# Patient Record
Sex: Female | Born: 1950
Health system: Southern US, Community
[De-identification: ages and names within clinical notes are randomized; demographics above are authoritative.]

## PROBLEM LIST (undated history)

## (undated) DIAGNOSIS — R51 Headache: Secondary | ICD-10-CM

## (undated) DIAGNOSIS — D510 Vitamin B12 deficiency anemia due to intrinsic factor deficiency: Secondary | ICD-10-CM

## (undated) DIAGNOSIS — G5600 Carpal tunnel syndrome, unspecified upper limb: Secondary | ICD-10-CM

## (undated) DIAGNOSIS — F411 Generalized anxiety disorder: Secondary | ICD-10-CM

## (undated) DIAGNOSIS — I10 Essential (primary) hypertension: Secondary | ICD-10-CM

## (undated) DIAGNOSIS — K9041 Non-celiac gluten sensitivity: Secondary | ICD-10-CM

## (undated) DIAGNOSIS — D802 Selective deficiency of immunoglobulin A [IgA]: Secondary | ICD-10-CM

## (undated) DIAGNOSIS — K589 Irritable bowel syndrome without diarrhea: Secondary | ICD-10-CM

## (undated) DIAGNOSIS — M899 Disorder of bone, unspecified: Secondary | ICD-10-CM

## (undated) DIAGNOSIS — M949 Disorder of cartilage, unspecified: Secondary | ICD-10-CM

## (undated) DIAGNOSIS — K828 Other specified diseases of gallbladder: Secondary | ICD-10-CM

## (undated) DIAGNOSIS — J309 Allergic rhinitis, unspecified: Secondary | ICD-10-CM

## (undated) DIAGNOSIS — E785 Hyperlipidemia, unspecified: Secondary | ICD-10-CM

## (undated) HISTORY — DX: Non-celiac gluten sensitivity: K90.41

## (undated) HISTORY — PX: TONSILLECTOMY: SHX5217

## (undated) HISTORY — DX: Generalized anxiety disorder: F41.1

## (undated) HISTORY — DX: Headache: R51

## (undated) HISTORY — DX: Other specified diseases of gallbladder: K82.8

## (undated) HISTORY — PX: CARPAL TUNNEL RELEASE: SHX101

## (undated) HISTORY — PX: APPENDECTOMY: SHX54

## (undated) HISTORY — DX: Disorder of bone, unspecified: M89.9

## (undated) HISTORY — DX: Selective deficiency of immunoglobulin a (iga): D80.2

## (undated) HISTORY — PX: ROTATOR CUFF REPAIR: SHX139

## (undated) HISTORY — DX: Carpal tunnel syndrome, unspecified upper limb: G56.00

## (undated) HISTORY — DX: Allergic rhinitis, unspecified: J30.9

## (undated) HISTORY — PX: UPPER GASTROINTESTINAL ENDOSCOPY: SHX188

## (undated) HISTORY — DX: Irritable bowel syndrome, unspecified: K58.9

## (undated) HISTORY — DX: Hyperlipidemia, unspecified: E78.5

## (undated) HISTORY — PX: DILATION AND CURETTAGE OF UTERUS: SHX78

## (undated) HISTORY — DX: Vitamin B12 deficiency anemia due to intrinsic factor deficiency: D51.0

## (undated) HISTORY — PX: COLONOSCOPY: SHX174

## (undated) HISTORY — DX: Disorder of bone, unspecified: M94.9

## (undated) HISTORY — DX: Essential (primary) hypertension: I10

---

## 1999-08-29 ENCOUNTER — Other Ambulatory Visit: Admission: RE | Admit: 1999-08-29 | Discharge: 1999-08-29 | Payer: Self-pay | Admitting: Family Medicine

## 2000-12-17 ENCOUNTER — Other Ambulatory Visit: Admission: RE | Admit: 2000-12-17 | Discharge: 2000-12-17 | Payer: Self-pay | Admitting: Family Medicine

## 2001-07-01 ENCOUNTER — Other Ambulatory Visit: Admission: RE | Admit: 2001-07-01 | Discharge: 2001-07-01 | Payer: Self-pay | Admitting: *Deleted

## 2002-03-06 ENCOUNTER — Other Ambulatory Visit: Admission: RE | Admit: 2002-03-06 | Discharge: 2002-03-06 | Payer: Self-pay | Admitting: *Deleted

## 2003-02-23 ENCOUNTER — Encounter: Admission: RE | Admit: 2003-02-23 | Discharge: 2003-02-23 | Payer: Self-pay | Admitting: *Deleted

## 2003-02-23 ENCOUNTER — Encounter: Payer: Self-pay | Admitting: *Deleted

## 2003-03-08 ENCOUNTER — Other Ambulatory Visit: Admission: RE | Admit: 2003-03-08 | Discharge: 2003-03-08 | Payer: Self-pay | Admitting: *Deleted

## 2003-06-04 ENCOUNTER — Encounter: Admission: RE | Admit: 2003-06-04 | Discharge: 2003-06-04 | Payer: Self-pay | Admitting: Internal Medicine

## 2003-06-04 ENCOUNTER — Encounter: Payer: Self-pay | Admitting: Internal Medicine

## 2003-09-08 HISTORY — PX: CHOLECYSTECTOMY: SHX55

## 2004-03-11 ENCOUNTER — Other Ambulatory Visit: Admission: RE | Admit: 2004-03-11 | Discharge: 2004-03-11 | Payer: Self-pay | Admitting: *Deleted

## 2004-05-04 ENCOUNTER — Observation Stay (HOSPITAL_COMMUNITY): Admission: EM | Admit: 2004-05-04 | Discharge: 2004-05-05 | Payer: Self-pay | Admitting: Emergency Medicine

## 2005-02-06 ENCOUNTER — Encounter: Payer: Self-pay | Admitting: Internal Medicine

## 2005-03-19 ENCOUNTER — Other Ambulatory Visit: Admission: RE | Admit: 2005-03-19 | Discharge: 2005-03-19 | Payer: Self-pay | Admitting: *Deleted

## 2005-03-27 ENCOUNTER — Encounter: Admission: RE | Admit: 2005-03-27 | Discharge: 2005-03-27 | Payer: Self-pay | Admitting: *Deleted

## 2005-04-09 ENCOUNTER — Encounter: Admission: RE | Admit: 2005-04-09 | Discharge: 2005-04-09 | Payer: Self-pay | Admitting: *Deleted

## 2005-04-14 ENCOUNTER — Encounter: Admission: RE | Admit: 2005-04-14 | Discharge: 2005-04-14 | Payer: Self-pay | Admitting: Gastroenterology

## 2005-04-29 ENCOUNTER — Encounter: Admission: RE | Admit: 2005-04-29 | Discharge: 2005-04-29 | Payer: Self-pay | Admitting: Gastroenterology

## 2005-07-15 ENCOUNTER — Encounter: Admission: RE | Admit: 2005-07-15 | Discharge: 2005-07-15 | Payer: Self-pay | Admitting: Internal Medicine

## 2005-07-24 ENCOUNTER — Ambulatory Visit (HOSPITAL_COMMUNITY): Admission: RE | Admit: 2005-07-24 | Discharge: 2005-07-24 | Payer: Self-pay | Admitting: General Surgery

## 2005-07-24 ENCOUNTER — Encounter (INDEPENDENT_AMBULATORY_CARE_PROVIDER_SITE_OTHER): Payer: Self-pay | Admitting: *Deleted

## 2005-08-19 ENCOUNTER — Ambulatory Visit: Payer: Self-pay | Admitting: Gastroenterology

## 2005-10-16 ENCOUNTER — Ambulatory Visit: Payer: Self-pay | Admitting: Gastroenterology

## 2005-12-02 ENCOUNTER — Ambulatory Visit: Payer: Self-pay | Admitting: Gastroenterology

## 2005-12-21 ENCOUNTER — Ambulatory Visit: Payer: Self-pay | Admitting: Gastroenterology

## 2006-01-29 ENCOUNTER — Encounter: Admission: RE | Admit: 2006-01-29 | Discharge: 2006-02-25 | Payer: Self-pay | Admitting: Internal Medicine

## 2006-03-03 ENCOUNTER — Ambulatory Visit: Payer: Self-pay | Admitting: Gastroenterology

## 2006-03-09 ENCOUNTER — Ambulatory Visit (HOSPITAL_COMMUNITY): Admission: RE | Admit: 2006-03-09 | Discharge: 2006-03-09 | Payer: Self-pay | Admitting: Gastroenterology

## 2006-03-11 ENCOUNTER — Ambulatory Visit (HOSPITAL_COMMUNITY): Admission: RE | Admit: 2006-03-11 | Discharge: 2006-03-11 | Payer: Self-pay | Admitting: Gastroenterology

## 2006-04-01 ENCOUNTER — Encounter (INDEPENDENT_AMBULATORY_CARE_PROVIDER_SITE_OTHER): Payer: Self-pay | Admitting: *Deleted

## 2006-04-01 ENCOUNTER — Other Ambulatory Visit: Admission: RE | Admit: 2006-04-01 | Discharge: 2006-04-01 | Payer: Self-pay | Admitting: *Deleted

## 2006-04-01 ENCOUNTER — Ambulatory Visit: Payer: Self-pay | Admitting: Gastroenterology

## 2006-04-19 ENCOUNTER — Ambulatory Visit: Payer: Self-pay | Admitting: Gastroenterology

## 2007-02-04 ENCOUNTER — Ambulatory Visit: Payer: Self-pay | Admitting: Gastroenterology

## 2007-02-04 LAB — CONVERTED CEMR LAB
Basophils Absolute: 0.1 10*3/uL (ref 0.0–0.1)
Basophils Relative: 1.3 % — ABNORMAL HIGH (ref 0.0–1.0)
Eosinophils Absolute: 0 10*3/uL (ref 0.0–0.6)
Eosinophils Relative: 0.5 % (ref 0.0–5.0)
Ferritin: 29.6 ng/mL (ref 10.0–291.0)
Folate: 6.7 ng/mL
HCT: 39.2 % (ref 36.0–46.0)
Hemoglobin: 13.7 g/dL (ref 12.0–15.0)
Hgb A1c MFr Bld: 5.5 % (ref 4.6–6.0)
Iron: 132 ug/dL (ref 42–145)
Lymphocytes Relative: 19.9 % (ref 12.0–46.0)
MCHC: 35 g/dL (ref 30.0–36.0)
MCV: 90.8 fL (ref 78.0–100.0)
Monocytes Absolute: 0.4 10*3/uL (ref 0.2–0.7)
Monocytes Relative: 7.3 % (ref 3.0–11.0)
Neutro Abs: 3.9 10*3/uL (ref 1.4–7.7)
Neutrophils Relative %: 71 % (ref 43.0–77.0)
Platelets: 219 10*3/uL (ref 150–400)
RBC: 4.32 M/uL (ref 3.87–5.11)
RDW: 12.2 % (ref 11.5–14.6)
Saturation Ratios: 27.9 % (ref 20.0–50.0)
Transferrin: 338.2 mg/dL (ref >=212.0)
Vitamin B-12: 197 pg/mL — ABNORMAL LOW (ref 211–911)
WBC: 5.5 10*3/uL (ref 4.5–10.5)

## 2007-02-18 ENCOUNTER — Encounter: Admission: RE | Admit: 2007-02-18 | Discharge: 2007-02-18 | Payer: Self-pay | Admitting: *Deleted

## 2007-03-10 ENCOUNTER — Ambulatory Visit: Payer: Self-pay | Admitting: Internal Medicine

## 2007-03-10 LAB — CONVERTED CEMR LAB
IgA: 54 mg/dL — ABNORMAL LOW (ref 68–378)
Tissue Transglutaminase Ab, IgA: 0 units (ref <7)

## 2007-03-24 ENCOUNTER — Ambulatory Visit: Payer: Self-pay | Admitting: Internal Medicine

## 2007-04-14 LAB — CONVERTED CEMR LAB
Albumin: 5.3 g/dL
Alkaline Phosphatase: 60 units/L
Calcium: 9.9 mg/dL
Creatinine, Ser: 0.76 mg/dL
Glucose, Bld: 87 mg/dL
Sodium: 130 meq/L
Total Protein: 6.9 g/dL

## 2007-04-28 ENCOUNTER — Other Ambulatory Visit: Admission: RE | Admit: 2007-04-28 | Discharge: 2007-04-28 | Payer: Self-pay | Admitting: *Deleted

## 2007-05-23 ENCOUNTER — Ambulatory Visit: Payer: Self-pay | Admitting: Internal Medicine

## 2007-09-16 LAB — CONVERTED CEMR LAB
BUN: 10 mg/dL
Calcium: 9.7 mg/dL
Creatinine, Ser: 0.88 mg/dL
HDL: 92 mg/dL
Sodium: 133 meq/L
Triglyceride fasting, serum: 38 mg/dL

## 2007-10-14 ENCOUNTER — Ambulatory Visit: Payer: Self-pay | Admitting: Internal Medicine

## 2007-10-14 DIAGNOSIS — N390 Urinary tract infection, site not specified: Secondary | ICD-10-CM | POA: Insufficient documentation

## 2007-10-14 DIAGNOSIS — K589 Irritable bowel syndrome without diarrhea: Secondary | ICD-10-CM | POA: Insufficient documentation

## 2007-10-14 DIAGNOSIS — K828 Other specified diseases of gallbladder: Secondary | ICD-10-CM | POA: Insufficient documentation

## 2007-10-14 DIAGNOSIS — D802 Selective deficiency of immunoglobulin A [IgA]: Secondary | ICD-10-CM | POA: Insufficient documentation

## 2007-10-14 DIAGNOSIS — D510 Vitamin B12 deficiency anemia due to intrinsic factor deficiency: Secondary | ICD-10-CM | POA: Insufficient documentation

## 2007-10-14 LAB — CONVERTED CEMR LAB: Vitamin B-12: 278 pg/mL (ref 211–911)

## 2007-11-04 ENCOUNTER — Ambulatory Visit: Payer: Self-pay | Admitting: Internal Medicine

## 2008-04-07 LAB — CONVERTED CEMR LAB: Pap Smear: NORMAL

## 2008-04-20 ENCOUNTER — Encounter: Admission: RE | Admit: 2008-04-20 | Discharge: 2008-04-20 | Payer: Self-pay | Admitting: Internal Medicine

## 2008-06-22 ENCOUNTER — Ambulatory Visit: Payer: Self-pay | Admitting: Internal Medicine

## 2008-06-22 DIAGNOSIS — M810 Age-related osteoporosis without current pathological fracture: Secondary | ICD-10-CM | POA: Insufficient documentation

## 2008-06-25 LAB — CONVERTED CEMR LAB
Ferritin: 21.2 ng/mL (ref 10.0–291.0)
Vitamin B-12: 1375 pg/mL — ABNORMAL HIGH (ref 211–911)

## 2008-06-27 LAB — CONVERTED CEMR LAB: Vit D, 1,25-Dihydroxy: 29 — ABNORMAL LOW (ref 30–89)

## 2008-06-29 ENCOUNTER — Ambulatory Visit: Payer: Self-pay | Admitting: Internal Medicine

## 2008-07-04 LAB — CONVERTED CEMR LAB
IgA: 58 mg/dL — ABNORMAL LOW (ref 68–378)
IgG (Immunoglobin G), Serum: 495 mg/dL — ABNORMAL LOW (ref 694–1618)
IgM, Serum: 55 mg/dL — ABNORMAL LOW (ref 60–263)

## 2008-07-06 ENCOUNTER — Telehealth (INDEPENDENT_AMBULATORY_CARE_PROVIDER_SITE_OTHER): Payer: Self-pay

## 2008-07-11 ENCOUNTER — Telehealth: Payer: Self-pay | Admitting: Internal Medicine

## 2008-07-27 ENCOUNTER — Encounter: Payer: Self-pay | Admitting: Internal Medicine

## 2009-03-29 ENCOUNTER — Encounter: Payer: Self-pay | Admitting: Internal Medicine

## 2009-03-29 LAB — CONVERTED CEMR LAB
ALT: 42 units/L
AST: 29 units/L
Albumin: 4.5 g/dL
Alkaline Phosphatase: 56 units/L
BUN: 7 mg/dL
CO2: 21 meq/L
Calcium: 9.6 mg/dL
Chloride: 102 meq/L
Cholesterol: 168 mg/dL
Creatinine, Ser: 0.8 mg/dL
Glucose, Bld: 96 mg/dL
HDL: 76 mg/dL
LDL (calc): 2.2 mg/dL
LDL Cholesterol: 82 mg/dL
Potassium: 4.2 meq/L
Sodium: 135 meq/L
Total Bilirubin: 0.5 mg/dL
Total Protein: 6.8 g/dL
Triglyceride fasting, serum: 52 mg/dL

## 2009-06-01 ENCOUNTER — Emergency Department (HOSPITAL_COMMUNITY): Admission: EM | Admit: 2009-06-01 | Discharge: 2009-06-01 | Payer: Self-pay | Admitting: Emergency Medicine

## 2009-07-12 ENCOUNTER — Ambulatory Visit: Payer: Self-pay | Admitting: Internal Medicine

## 2009-07-12 DIAGNOSIS — M25539 Pain in unspecified wrist: Secondary | ICD-10-CM | POA: Insufficient documentation

## 2009-07-12 DIAGNOSIS — M542 Cervicalgia: Secondary | ICD-10-CM | POA: Insufficient documentation

## 2009-07-12 DIAGNOSIS — I10 Essential (primary) hypertension: Secondary | ICD-10-CM | POA: Insufficient documentation

## 2009-07-12 DIAGNOSIS — R519 Headache, unspecified: Secondary | ICD-10-CM | POA: Insufficient documentation

## 2009-07-12 DIAGNOSIS — E785 Hyperlipidemia, unspecified: Secondary | ICD-10-CM | POA: Insufficient documentation

## 2009-07-12 DIAGNOSIS — G5600 Carpal tunnel syndrome, unspecified upper limb: Secondary | ICD-10-CM | POA: Insufficient documentation

## 2009-07-12 DIAGNOSIS — R51 Headache: Secondary | ICD-10-CM | POA: Insufficient documentation

## 2009-07-12 DIAGNOSIS — Z9889 Other specified postprocedural states: Secondary | ICD-10-CM | POA: Insufficient documentation

## 2009-07-19 ENCOUNTER — Encounter: Payer: Self-pay | Admitting: Internal Medicine

## 2009-07-19 ENCOUNTER — Telehealth: Payer: Self-pay | Admitting: Internal Medicine

## 2009-10-11 ENCOUNTER — Encounter: Payer: Self-pay | Admitting: Internal Medicine

## 2009-10-17 ENCOUNTER — Telehealth: Payer: Self-pay | Admitting: Internal Medicine

## 2009-12-17 ENCOUNTER — Telehealth: Payer: Self-pay | Admitting: Internal Medicine

## 2010-01-17 ENCOUNTER — Ambulatory Visit: Payer: Self-pay | Admitting: Internal Medicine

## 2010-01-17 DIAGNOSIS — F411 Generalized anxiety disorder: Secondary | ICD-10-CM | POA: Insufficient documentation

## 2010-01-17 DIAGNOSIS — J309 Allergic rhinitis, unspecified: Secondary | ICD-10-CM | POA: Insufficient documentation

## 2010-01-21 ENCOUNTER — Encounter: Payer: Self-pay | Admitting: Internal Medicine

## 2010-07-03 ENCOUNTER — Telehealth: Payer: Self-pay | Admitting: Internal Medicine

## 2010-07-18 ENCOUNTER — Encounter: Payer: Self-pay | Admitting: Internal Medicine

## 2010-07-18 ENCOUNTER — Ambulatory Visit: Payer: Self-pay | Admitting: Internal Medicine

## 2010-07-18 DIAGNOSIS — M25569 Pain in unspecified knee: Secondary | ICD-10-CM | POA: Insufficient documentation

## 2010-07-21 LAB — CONVERTED CEMR LAB
ALT: 28 units/L (ref 0–35)
BUN: 11 mg/dL (ref 6–23)
CO2: 29 meq/L (ref 19–32)
Chloride: 104 meq/L (ref 96–112)
Cholesterol: 193 mg/dL (ref 0–200)
Eosinophils Relative: 3.4 % (ref 0.0–5.0)
Glucose, Bld: 105 mg/dL — ABNORMAL HIGH (ref 70–99)
HCT: 39.3 % (ref 36.0–46.0)
Lymphs Abs: 0.9 10*3/uL (ref 0.7–4.0)
MCV: 92.1 fL (ref 78.0–100.0)
Monocytes Absolute: 0.4 10*3/uL (ref 0.1–1.0)
Platelets: 204 10*3/uL (ref 150.0–400.0)
Potassium: 5.1 meq/L (ref 3.5–5.1)
TSH: 1.92 microintl units/mL (ref 0.35–5.50)
Total Bilirubin: 0.9 mg/dL (ref 0.3–1.2)
Total Protein: 6.5 g/dL (ref 6.0–8.3)
WBC: 4 10*3/uL — ABNORMAL LOW (ref 4.5–10.5)

## 2010-07-22 ENCOUNTER — Telehealth: Payer: Self-pay | Admitting: Internal Medicine

## 2010-09-19 ENCOUNTER — Encounter: Payer: Self-pay | Admitting: Internal Medicine

## 2010-10-07 NOTE — Progress Notes (Signed)
Summary: cozaar  Phone Note Refill Request Message from:  Fax from Pharmacy on July 03, 2010 8:54 AM  Refills Requested: Medication #1:  COZAAR 50 MG TABS take 1 by mouth qd  Method Requested: Electronic Next Appointment Scheduled: 07/18/10 Initial call taken by: Orlan Leavens RMA,  July 03, 2010 8:54 AM    Prescriptions: COZAAR 50 MG TABS (LOSARTAN POTASSIUM) take 1 by mouth qd  #30 x 0   Entered by:   Orlan Leavens RMA   Authorized by:   Newt Lukes MD   Signed by:   Orlan Leavens RMA on 07/03/2010   Method used:   Electronically to        [facility] (retail)       [address]       [place], [place]  [id number]       Ph: [phone] or [phone]       Fax: [phone]   RxID:   [id number]

## 2010-10-07 NOTE — Progress Notes (Signed)
Summary: nitrofurantoin  Phone Note Refill Request Message from:  Fax from Pharmacy on December 17, 2009 9:17 AM  Refills Requested: Medication #1:  MACRODANTIN 100 MG CAPS take 1 po once daily. # 30   Last Refilled: 11/15/2009  Method Requested: Electronic Initial call taken by: Orlan Leavens,  December 17, 2009 9:17 AM    Prescriptions: MACRODANTIN 100 MG CAPS (NITROFURANTOIN MACROCRYSTAL) take 1 po once daily  #30 x 1   Entered by:   Orlan Leavens   Authorized by:   Newt Lukes MD   Signed by:   Orlan Leavens on 12/17/2009   Method used:   Electronically to        [facility] (retail)       [address]       [place], [place]  [id number]       Ph: [phone] or [phone]       Fax: [phone]   RxID:   [id number]

## 2010-10-07 NOTE — Assessment & Plan Note (Signed)
Summary: 6 MTH FU STC   Vital Signs:  Patient profile:   60 year old female Height:      59.5 inches (151.13 cm) Weight:      126.0 pounds (57.27 kg) O2 Sat:      97 % on Room air Temp:     98.5 degrees F (36.94 degrees C) oral Pulse rate:   82 / minute BP sitting:   132 / 82  (left arm) Cuff size:   regular  Vitals Entered By: Orlan Leavens (Jan 17, 2010 8:26 AM)  O2 Flow:  Room air CC: 6 month follow-up Is Patient Diabetic? No Pain Assessment Patient in pain? no        Primary Care Provider:  Newt Lukes MD  CC:  6 month follow-up.  History of Present Illness: here for 6 month f/u  1)  left wrist pain - improved with injection at [facility] ortho   2) HTN -  reports compliance with ongoing medical treatment and no changes in medication dose or frequency. denies adverse side effects related to current therapy. no CP, edema or SOB  3) dyslipidemia reports compliance with ongoing medical treatment and no changes in medication dose or frequency. denies adverse side effects related to current therapy. no GI or muscl skel c/o  4) recurrent UTI- no recent symptoms - takes abx for prophlaxis - no fever, dysuria or flank pain  5) c/o sinus pressure and allergy symptoms - not usiong any otc meds - occ swelling eyelid area each am upon awaking - better with time awake and upright position  Clinical Review Panels:  Prevention   Last Mammogram:  normal (04/07/2008)   Last Pap Smear:  normal (04/07/2008)   Last Colonoscopy:  Results: Normal. Including terminal ileum  Dr. Loreta Ave, [facility] (02/06/2005)  Immunizations   Last Tetanus Booster:  Historical (09/08/2003)  Lipid Management   Cholesterol:  168 (03/29/2009)   LDL (bad choesterol):  82 (03/29/2009)   HDL (good cholesterol):  76 (03/29/2009)   Triglycerides:  52 (03/29/2009)  CBC   WBC:  5.5 (02/04/2007)   RBC:  4.32 (02/04/2007)   Hgb:  13.7 (02/04/2007)   Hct:  39.2 (02/04/2007)   Platelets:  219  (02/04/2007)   MCV  90.8 (02/04/2007)   MCHC  35.0 (02/04/2007)   RDW  12.2 (02/04/2007)   PMN:  71.0 (02/04/2007)   Lymphs:  19.9 (02/04/2007)   Monos:  7.3 (02/04/2007)   Eosinophils:  0.5 (02/04/2007)   Basophil:  1.3 (02/04/2007)  Complete Metabolic Panel   Glucose:  96 (03/29/2009)   Sodium:  135 (03/29/2009)   Potassium:  4.2 (03/29/2009)   Chloride:  102 (03/29/2009)   CO2:  21 (03/29/2009)   BUN:  7 (03/29/2009)   Creatinine:  0.8 (03/29/2009)   Albumin:  4.5 (03/29/2009)   Total Protein:  6.8 (03/29/2009)   Calcium:  9.6 (03/29/2009)   Total Bili:  0.5 (03/29/2009)   Alk Phos:  56 (03/29/2009)   SGPT (ALT):  42 (03/29/2009)   SGOT (AST):  29 (03/29/2009)   Current Medications (verified): 1)  Vitamin B-12 Cr 1000 Mcg  Tbcr (Cyanocobalamin) .[name].. 1 Once Daily 2)  Effexor Xr 150 Mg Xr24h-Cap (Venlafaxine Hcl) .... Take 1 By Mouth Qd 3)  Crestor 20 Mg Tabs (Rosuvastatin Calcium) .... Take 1/2 By Mouth Qd 4)  Cozaar 50 Mg Tabs (Losartan Potassium) .... Take 1 By Mouth Qd 5)  Macrodantin 100 Mg Caps (Nitrofurantoin Macrocrystal) .... Take 1 Po Once  Daily  Allergies (verified): 1)  Percocet (Oxycodone-Acetaminophen) 2)  Vicodin (Hydrocodone-Acetaminophen)  Past History:  Past Medical History: Pernicious Anemia (+ Intrinsic Factor Abs) IgA deficiency Dyslipidemia Hypertension Anxiety and depression Gastric emptying study normal over 2 hours, but borderline abnormal     in the 1st hour, 52% retention with normal 50%. Chronic headaches. History of recurrent urinary tract infections.  Fatty Liver (CT)  MD rooster: ortho - MW - [facility] - [facility] - [facility]  Social History: Reviewed history from 07/12/2009 and no changes required. 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  Alcohol Use - yes Occasionally Illicit Drug Use - no Daily Caffeine Use married, lives with spouse  Review of Systems  The patient denies weight loss, chest pain, and  headaches.         c/o bruise on left calf from recent trauma, no pain or trouble walking  Physical Exam  General:  alert, well-developed, well-nourished, and cooperative to examination.    Lungs:  normal respiratory effort, no intercostal retractions or use of accessory muscles; normal breath sounds bilaterally - no crackles and no wheezes.    Heart:  normal rate, regular rhythm, no murmur, and no rub. BLE without edema. normal DP pulses and normal cap refill in all 4 extremities      Impression & Recommendations:  Problem # 1:  WRIST PAIN, LEFT (ICD-719.43) improved since last vist s/p steroid injectioon for [place] per ortho as needed   Problem # 2:  HYPERTENSION (ICD-401.9)  Her updated medication list for this problem includes:    Cozaar 50 Mg Tabs (Losartan potassium) .[name]... Take 1 by mouth qd  BP today: 132/82 Prior BP: 112/78 (07/12/2009)  Labs Reviewed: K+: 4.2 (03/29/2009) Creat: : 0.8 (03/29/2009)   Chol: 168 (03/29/2009)   HDL: 76 (03/29/2009)   LDL: 2.2 (03/29/2009)   TG: 52 (03/29/2009)  Problem # 3:  DYSLIPIDEMIA (ICD-272.4)  plan recheck FLP next visit - stable, cont same Her updated medication list for this problem includes:    Crestor 20 Mg Tabs (Rosuvastatin calcium) .[name]... Take 1/2 by mouth qd  Labs Reviewed: SGOT: 29 (03/29/2009)   SGPT: 42 (03/29/2009)   HDL:76 (03/29/2009)  LDL:2.2 (03/29/2009), 82 (52/84/1324)  Chol:168 (03/29/2009)  Trig:52 (03/29/2009)  Problem # 4:  Hx of ANXIETY (ICD-300.00) Assessment: Unchanged cont same - well controlled Her updated medication list for this problem includes:    Effexor Xr 150 Mg Xr24h-cap (Venlafaxine hcl) .[name]... Take 1 by mouth qd  Problem # 5:  ALLERGIC RHINITIS (ICD-477.9)  perdominately posterior eye and sinus pressure>sneezing/drainage trial nasal steroid -  Her updated medication list for this problem includes:    Nasonex 50 Mcg/act Susp (Mometasone furoate) .[name]... 2 sprays each nostril  every  morning  Orders: Prescription Created Electronically [phone])  Complete Medication List: 1)  Vitamin B-12 Cr 1000 Mcg Tbcr (Cyanocobalamin) .[name].. 1 once daily 2)  Effexor Xr 150 Mg Xr24h-cap (Venlafaxine hcl) .... Take 1 by mouth qd 3)  Crestor 20 Mg Tabs (Rosuvastatin calcium) .... Take 1/2 by mouth qd 4)  Cozaar 50 Mg Tabs (Losartan potassium) .... Take 1 by mouth qd 5)  Macrodantin 100 Mg Caps (Nitrofurantoin macrocrystal) .... Take 1 po once daily 6)  Nasonex 50 Mcg/act Susp (Mometasone furoate) .... 2 sprays each nostril  every morning  Patient Instructions: 1)  it was good to see you today. 2)  labs and medications reviewed today - will check cholesteol next visit so come in fasting! continue same until  then 3)  try nasonex for sinus pressure symptoms - your prescription has been electronically submitted to your pharmacy. Please take as directed. Contact our office if you believe you're having problems with the medication(s).  4)  Please schedule a follow-up appointment in 6 months, sooner if problems.  Prescriptions: NASONEX 50 MCG/ACT SUSP (MOMETASONE FUROATE) 2 sprays each nostril  every morning  #1 x 1   Entered and Authorized by:   Newt Lukes MD   Signed by:   Newt Lukes MD on 01/17/2010   Method used:   Electronically to        [facility] (retail)       [address]       [place], [place]  [id number]       Ph: [phone] or [phone]       Fax: [phone]   RxID:   [id number]

## 2010-10-07 NOTE — Procedures (Signed)
Summary: [facility]  EGD/[facility]   Imported By: Sherian Rein 01/21/2010 13:51:11  _____________________________________________________________________  External Attachment:    Type:   Image     Comment:   External Document

## 2010-10-07 NOTE — Assessment & Plan Note (Signed)
Summary: 6 MTH FU  STC   Vital Signs:  Patient profile:   60 year old female Height:      59.5 inches (151.13 cm) Weight:      127.4 pounds (57.91 kg) BMI:     25.39 O2 Sat:      98 % on Room air Temp:     98.5 degrees F (36.94 degrees C) oral Pulse rate:   74 / minute BP sitting:   132 / 90  (left arm) Cuff size:   regular  Vitals Entered By: Orlan Leavens RMA (July 18, 2010 8:30 AM)  O2 Flow:  Room air CC: 6 month follow-up Is Patient Diabetic? No Pain Assessment Patient in pain? no        Primary Care Provider:  Newt Lukes MD  CC:  6 month follow-up.  History of Present Illness: patient is here today for annual physical. Patient feels well overall.   also review chronic med issues:  HTN -  reports compliance with ongoing medical treatment and no changes in medication dose or frequency. denies adverse side effects related to current therapy. no CP, edema or SOB  dyslipidemia reports compliance with ongoing medical treatment and no changes in medication dose or frequency. denies adverse side effects related to current therapy. no GI or muscl skel c/o  recurrent UTI- no recent symptoms - takes abx for prophlaxis - no fever, dysuria or flank pain  chronic sinus pressure and allergy symptoms - not usiong any otc meds - occ swelling eyelid area each am upon awaking - better with time awake and upright position  c/o right knee pain and "lump" on lateral side of knee  Preventive Screening-Counseling & Management  Alcohol-Tobacco     Alcohol drinks/day: <1     Alcohol Counseling: not indicated; use of alcohol is not excessive or problematic     Smoking Status: never     Tobacco Counseling: not indicated; no tobacco use  Caffeine-Diet-Exercise     Caffeine use/day: 1-2     Caffeine Counseling: not indicated; caffeine use is not excessive or problematic     Does Patient Exercise: yes     Exercise Counseling: to improve exercise regimen     Depression  Counseling: not indicated; screening negative for depression  Safety-Violence-Falls     Seat Belt Counseling: not indicated; patient wears seat belts     Helmet Counseling: not indicated; patient wears helmet when riding bicycle/motocycle     Firearm Counseling: not applicable     Violence Counseling: not indicated; no violence risk noted     Fall Risk Counseling: not indicated; no significant falls noted  Clinical Review Panels:  Prevention   Last Mammogram:  normal (04/07/2008)   Last Pap Smear:  normal (04/07/2008)   Last Colonoscopy:  Results: Normal. Including terminal ileum  Dr. Loreta Ave[facility] (02[facility])  Immunizations   Last Tetanus Booster:  Historical (09/08/2003)  Lipid Management   Cholesterol:  168 (03/29/2009)   LDL (bad choesterol):  82 (03/29/2009)   HDL (good cholesterol):  76 (03/29/2009)   Triglycerides:  52 (03/29/2009)  CBC   WBC:  5.5 (02/04/2007)   RBC:  4.32 (02/04/2007)   Hgb:  13.7 (02/04/2007)   Hct:  39.2 (02/04/2007)   Platelets:  219 (02/04/2007)   MCV  90.8 (02/04/2007)   MCHC  35.0 (02/04/2007)   RDW  12.2 (02/04/2007)   PMN:  71.0 (02/04/2007)   Lymphs:  19.9 (02/04/2007)   Monos:  7.3 (02/04/2007)  Eosinophils:  0.5 (02/04/2007)   Basophil:  1.3 (02/04/2007)  Complete Metabolic Panel   Glucose:  96 (03/29/2009)   Sodium:  135 (03/29/2009)   Potassium:  4.2 (03/29/2009)   Chloride:  102 (03/29/2009)   CO2:  21 (03/29/2009)   BUN:  7 (03/29/2009)   Creatinine:  0.8 (03/29/2009)   Albumin:  4.5 (03/29/2009)   Total Protein:  6.8 (03/29/2009)   Calcium:  9.6 (03/29/2009)   Total Bili:  0.5 (03/29/2009)   Alk Phos:  56 (03/29/2009)   SGPT (ALT):  42 (03/29/2009)   SGOT (AST):  29 (03/29/2009)   Current Medications (verified): 1)  Effexor Xr 150 Mg Xr24h-Cap (Venlafaxine Hcl) .... Take 1 By Mouth Qd 2)  Crestor 20 Mg Tabs (Rosuvastatin Calcium) .... Take 1/2 By Mouth Qd 3)  Cozaar 50 Mg Tabs (Losartan Potassium) ....  Take 1 By Mouth Qd 4)  Macrodantin 100 Mg Caps (Nitrofurantoin Macrocrystal) .... Take 1 Po Once Daily  Allergies (verified): 1)  Percocet (Oxycodone-Acetaminophen) 2)  Vicodin (Hydrocodone-Acetaminophen)  Past History:  Past medical, surgical, family and social histories (including risk factors) reviewed, and no changes noted (except as noted below).  Past Medical History: Pernicious Anemia (+ Intrinsic Factor Abs) IgA deficiency Dyslipidemia  Hypertension Anxiety and depression Gastric emptying study normal over 2 hours, but borderline abnormal     in the 1st hour, 52% retention with normal 50%. Chronic headaches. History of recurrent urinary tract infections.  Fatty Liver (CT)  MD roster: ortho - MW - [facility] - [facility] - [facility]  Past Surgical History: Reviewed history from 07/12/2009 and no changes required. Laparoscopic cholecystectomy 2005 Derrell Lolling), gallbladder dyskinesia Cesarean section x 2 -1974, 1983 Tonsillectomy D & C x's 2 biopsy 1990 Carpal tunnel release (2001) Rotator cuff repair (2001) -[facility]  Family History: Reviewed history from 07/12/2009 and no changes required. No FH of Colon Cancer: Family History of Ovarian Cancer: (aunt) Family History of Colitis/Crohn's:mother Family History of Diabetes:  (aunt) Family History of Heart Disease: (mom) Family History High cholesterol (mom) Family History Lung cancer (mom & dad) Family History Hypertension (mom) Has a brother with leukemia  Social History: Reviewed history from 07/12/2009 and no changes required. 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  Alcohol Use - yes Occasionally Illicit Drug Use - no Daily Caffeine Use married, lives with spouse  Review of Systems       see HPI above. I have reviewed all other systems and they were negative.   Physical Exam  General:  alert, well-developed, well-nourished, and cooperative to examination.    Head:  Normocephalic and  atraumatic without obvious abnormalities. No apparent alopecia or balding. Eyes:  vision grossly intact; pupils equal, round and reactive to light.  conjunctiva and lids normal.    Ears:  normal pinnae bilaterally, without erythema, swelling, or tenderness to palpation. TMs clear, without effusion, or cerumen impaction. Hearing grossly normal bilaterally  Mouth:  teeth and gums in good repair; mucous membranes moist, without lesions or ulcers. oropharynx clear without exudate, no erythema.  Neck:  supple, full ROM, no masses, no thyromegaly; no thyroid nodules or tenderness. no JVD or carotid bruits.   Lungs:  normal respiratory effort, no intercostal retractions or use of accessory muscles; normal breath sounds bilaterally - no crackles and no wheezes.    Heart:  normal rate, regular rhythm, no murmur, and no rub. BLE without edema. normal DP pulses and normal cap refill in all 4 extremities    Abdomen:  soft, non-tender, normal bowel sounds, no distention; no masses and no appreciable hepatomegaly or splenomegaly.   Msk:  right knee: full range of motion, no joint effusion or swelling. no erythema or abnormal warmth. Stable to ligamentous testing. Nontender to palpation. Neurovascularly intact. prominent lateral tibial tubericle prominence Neurologic:  alert & oriented X3 and cranial nerves II-XII symetrically intact.  strength normal in all extremities, sensation intact to light touch, and gait normal. speech fluent without dysarthria or aphasia; follows commands with good comprehension.  Skin:  no rashes, vesicles, ulcers, or erythema. No nodules or irregularity to palpation.  Psych:  Oriented X3, memory intact for recent and remote, normally interactive, good eye contact, mildly anxious appearing, not depressed appearing, and not agitated.      Impression & Recommendations:  Problem # 1:  PREVENTIVE HEALTH CARE (ICD-V70.0) Patient has been counseled on age-appropriate routine health concerns  for screening and prevention. These are reviewed and up-to-date. Immunizations are up-to-date or declined. Labs ordered and ECG reviewed.  Orders: TLB-Lipid Panel (80061-LIPID) TLB-BMP (Basic Metabolic Panel-BMET) (80048-METABOL) TLB-CBC Platelet - w/Differential (85025-CBCD) TLB-Hepatic/Liver Function Pnl (80076-HEPATIC) TLB-TSH (Thyroid Stimulating Hormone) (84443-TSH) EKG w/ Interpretation (93000)  Problem # 2:  KNEE PAIN, RIGHT (ICD-719.46)  no abn swelling on exam - not reproducible pain symptoms - check xray and try meloxicam in place of advil otc-  erx done consider eval by dr. Margaretha Sheffield (prev saw same for wrist) Her updated medication list for this problem includes:    Meloxicam 15 Mg Tabs (Meloxicam) .[name]... 1 by mouth once daily x 1 week, then as needed for arthritis pain  Orders: T-Knee Right 2 view (73560TC) Prescription Created Electronically [phone])  Discussed strengthening exercises, use of ice or heat, and medications.   Problem # 3:  DYSLIPIDEMIA (ICD-272.4)  Her updated medication list for this problem includes:    Crestor 20 Mg Tabs (Rosuvastatin calcium) .[name]... Take 1/2 by mouth qd  Labs Reviewed: SGOT: 29 (03/29/2009)   SGPT: 42 (03/29/2009)   HDL:76 (03/29/2009), 92 (09/16/2007)  LDL:2.2 (03/29/2009), 82 (76/28/3151)  Chol:168 (03/29/2009), 196 (09/16/2007)  Trig:52 (03/29/2009), 38 (09/16/2007)  Problem # 4:  HYPERTENSION (ICD-401.9)  Her updated medication list for this problem includes:    Cozaar 50 Mg Tabs (Losartan potassium) .[name]... Take 1 by mouth qd  BP today: 132/90 Prior BP: 132/82 (01/17/2010)  Labs Reviewed: K+: 4.2 (03/29/2009) Creat: : 0.8 (03/29/2009)   Chol: 168 (03/29/2009)   HDL: 76 (03/29/2009)   LDL: 2.2 (03/29/2009)   TG: 52 (03/29/2009)  Complete Medication List: 1)  Effexor Xr 150 Mg Xr24h-cap (Venlafaxine hcl) .... Take 1 by mouth qd 2)  Crestor 20 Mg Tabs (Rosuvastatin calcium) .... Take 1/2 by mouth qd 3)  Cozaar 50 Mg Tabs  (Losartan potassium) .... Take 1 by mouth qd 4)  Macrodantin 100 Mg Caps (Nitrofurantoin macrocrystal) .... Take 1 po once daily 5)  Meloxicam 15 Mg Tabs (Meloxicam) .[name].. 1 by mouth once daily x 1 week, then as needed for arthritis pain  Patient Instructions: 1)  it was good to see you today. 2)  test(s) ordered today - your results will be posted on the phone tree for review in 48-72 hours from the time of test completion; call [phone] and enter your 9 digit MRN (listed above on this page, just below your name); if any changes need to be made or there are abnormal results, you will be contacted directly.  3)  Let us know if you need referral back to Dr. Margaretha Sheffield  at [facility] for your knee 4)  try using meloxicam in place of advil for joint aches and arthritis pain - your prescription has been electronically submitted to your pharmacy. Please take as directed. Contact our office if you believe you're having problems with the medication(s).  5)  Schedule your mammogram. 6)  You need to have a Pap Smear to prevent cervical cancer. 7)  Please schedule a follow-up appointment in 6 months for recheck blood presure and medication review, call sooner if problems.  Prescriptions: COZAAR 50 MG TABS (LOSARTAN POTASSIUM) take 1 by mouth qd  #30 x 6   Entered and Authorized by:   Newt Lukes MD   Signed by:   Newt Lukes MD on 07/18/2010   Method used:   Electronically to        [facility] (retail)       [address]       [place], [place]  [address]       Ph: [phone] or [phone]       Fax: [phone]   RxID:   [id number] CRESTOR 20 MG TABS (ROSUVASTATIN CALCIUM) take 1/2 by mouth qd  #30 x 3   Entered and Authorized by:   Newt Lukes MD   Signed by:   Newt Lukes MD on 07/18/2010   Method used:   Electronically to        [facility] (retail)       [address]       [place], [place]  [address]       Ph: [phone] or  [phone]       Fax: [phone]   RxID:   [id number] EFFEXOR XR 150 MG XR24H-CAP (VENLAFAXINE HCL) take 1 by mouth qd  #30 x 6   Entered and Authorized by:   Newt Lukes MD   Signed by:   Newt Lukes MD on 07/18/2010   Method used:   Electronically to        [facility] (retail)       [address]       [place], [place]  [id number]       Ph: [phone] or [phone]       Fax: [phone]   RxID:   [id number] MELOXICAM 15 MG TABS (MELOXICAM) 1 by mouth once daily x 1 week, then as needed for arthritis pain  #30 x 1   Entered and Authorized by:   Newt Lukes MD   Signed by:   Newt Lukes MD on 07/18/2010   Method used:   Electronically to        [facility] (retail)       [address]       [place], [place]  [address]       Ph: [phone] or [phone]       Fax: [phone]   RxID:   [id number]    Orders Added: 1)  TLB-Lipid Panel [80061-LIPID] 2)  TLB-BMP (Basic Metabolic Panel-BMET) [80048-METABOL] 3)  TLB-CBC Platelet - w/Differential [85025-CBCD] 4)  TLB-Hepatic/Liver Function Pnl [80076-HEPATIC] 5)  TLB-TSH (Thyroid Stimulating Hormone) [84443-TSH] 6)  EKG w/ Interpretation [93000] 7)  T-Knee Right 2 view [73560TC] 8)  Est. Patient 40-64 years [99396] 9)  Est. Patient Level III [25053] 10)  Prescription Created Electronically [phone]

## 2010-10-07 NOTE — Progress Notes (Signed)
Summary: crestor  Phone Note Refill Request Message from:  Fax from Pharmacy on October 17, 2009 8:51 AM  Refills Requested: Medication #1:  CRESTOR 20 MG TABS take 1/2 by mouth qd  Method Requested: Electronic Initial call taken by: Orlan Leavens,  October 17, 2009 8:51 AM    New/Updated Medications: CRESTOR 20 MG TABS (ROSUVASTATIN CALCIUM) take 1/2 by mouth qd Prescriptions: CRESTOR 20 MG TABS (ROSUVASTATIN CALCIUM) take 1/2 by mouth qd  #30 x 3   Entered by:   Orlan Leavens   Authorized by:   Newt Lukes MD   Signed by:   Orlan Leavens on 10/17/2009   Method used:   Telephoned to ...       [facility] (retail)       [address]       [place], [place]  [id number]       Ph: [phone] or [phone]       Fax: [phone]   RxID:   [id number]

## 2010-10-07 NOTE — Progress Notes (Signed)
Summary: MED Change  Phone Note Call from Patient Call back at Home Phone [phone]   Caller: Patient Summary of Call: Pt called requesting to have direction for Crestor changed to 1 tab by mouth once daily instead of 1/2 tab. Per pt it is more expensive for 1/2 than for 1 tab. Pt is also requesting to reduce Effexor to 75mg . She says she is feeling improved.  Initial call taken by: Margaret Pyle, CMA,  July 22, 2010 10:12 AM  Follow-up for Phone Call        meds changed - erx done Follow-up by: Newt Lukes MD,  July 22, 2010 11:14 AM  Additional Follow-up for Phone Call Additional follow up Details #1::        left detailed vm on pt's home # Additional Follow-up by: Lamar Sprinkles, CMA,  July 22, 2010 12:28 PM    New/Updated Medications: EFFEXOR XR 75 MG XR24H-CAP (VENLAFAXINE HCL) 1 by mouth once daily CRESTOR 20 MG TABS (ROSUVASTATIN CALCIUM) take 1 by mouth once daily (or as directed) Prescriptions: EFFEXOR XR 75 MG XR24H-CAP (VENLAFAXINE HCL) 1 by mouth once daily  #30 x 6   Entered and Authorized by:   Newt Lukes MD   Signed by:   Newt Lukes MD on 07/22/2010   Method used:   Electronically to        [facility] (retail)       [address]       [place], [place]  [id number]       Ph: [phone] or [phone]       Fax: [phone]   RxID:   [id number] CRESTOR 20 MG TABS (ROSUVASTATIN CALCIUM) take 1 by mouth once daily (or as directed)  #30 x 6   Entered and Authorized by:   Newt Lukes MD   Signed by:   Newt Lukes MD on 07/22/2010   Method used:   Electronically to        [facility] (retail)       [address]       [place], [place]  [id number]       Ph: [phone] or [phone]       Fax: [phone]   RxID:   [id number]

## 2010-10-10 NOTE — Letter (Signed)
Summary: [facility]  [facility]   Imported By: Sherian Rein 10/16/2009 10:01:19  _____________________________________________________________________  External Attachment:    Type:   Image     Comment:   External Document

## 2010-10-13 ENCOUNTER — Encounter: Payer: Self-pay | Admitting: Internal Medicine

## 2010-10-23 NOTE — Letter (Signed)
Summary: Ralene Cork DO  Ozzie Hoyle Draper DO   Imported By: Lester [place] 10/17/2010 15:27:35  _____________________________________________________________________  External Attachment:    Type:   Image     Comment:   External Document

## 2010-10-23 NOTE — Letter (Signed)
Summary: Ralene Cork DO  Ozzie Hoyle Draper DO   Imported By: Lester [place] 10/17/2010 15:28:43  _____________________________________________________________________  External Attachment:    Type:   Image     Comment:   External Document

## 2010-11-14 ENCOUNTER — Encounter: Payer: Self-pay | Admitting: Internal Medicine

## 2010-11-25 NOTE — Letter (Signed)
Summary: [name]  [name]   Imported By: Sherian Rein 11/19/2010 09:38:08  _____________________________________________________________________  External Attachment:    Type:   Image     Comment:   External Document

## 2010-12-03 ENCOUNTER — Other Ambulatory Visit: Payer: Self-pay | Admitting: *Deleted

## 2010-12-03 MED ORDER — NITROFURANTOIN MONOHYD MACRO 100 MG PO CAPS: 100.0000 mg | ORAL_CAPSULE | Freq: Two times a day (BID) | ORAL | Status: DC

## 2010-12-03 NOTE — Telephone Encounter (Signed)
Yes, takes antibiotics daily for UTI prophylaxis - ok to send 30 with 6 refills - thanks

## 2010-12-03 NOTE — Telephone Encounter (Signed)
Sent rx to [facility], add Nitrofuratoin to med list...12/03/10@10 :04am/LMB

## 2011-01-20 NOTE — Assessment & Plan Note (Signed)
[facility]                         GASTROENTEROLOGY OFFICE NOTE   NAME:[name], [name]                       MRN:          [id number]  DATE:10/14/2007                            DOB:          [birth year]    CHIEF COMPLAINT:  Followup of presumed irritable bowel syndrome,  gastrointestinal function disorder.   HISTORY OF PRESENT ILLNESS:  She has been avoiding wheat products and a  lot of gluten problems, though not strictly. She never did try BuSpar,  as I recommended in September of 2008. Her other problem includes  vitamin B12 deficiency. She really feels like things are a lot better.  Her weight is down a few pounds to 123.   MEDICATIONS:  Listed and reviewed in the chart. She is using MiraLax  every other day. Still using Reglan 5 mg once a day. Not on acid  suppression anymore, i.e. Zegerid was stopped.   PHYSICAL EXAMINATION:  VITAL SIGNS:  Weight 123 pounds. Height 4 foot  11. Blood pressure 102/70.   ASSESSMENT:  1. Irritable bowel syndrome/functional gastrointestinal disturbance.  2. Vitamin B12 deficiency from pernicious anemia.   PLAN:  1. Recheck B12 level and then determine a treatment regimen and will      call her.  2. Continue her gluten restriction, as she is.  3. Followup as needed.     Iva Boop, MD,FACG  Electronically Signed    CEG/MedQ  DD: 10/14/2007  DT: 10/16/2007  Job #: [id number]

## 2011-01-20 NOTE — Assessment & Plan Note (Signed)
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  She  has been worked up quite extensively for somewhat vague abdominal  symptoms, which she has had for a number of years, with endoscopic  procedures both by myself and Dr. Elsie Amis.  Dr. Loreta Ave told her she had  irritable bowel syndrome.  She should be on fibrinogen.   PAST MEDICAL HISTORY:  Reveals laparoscopic appendectomy in 2005.  Hand  surgery, rotator cuff surgery, two C-sections.  She is also status post  cholecystectomy.  She does have history of gastroesophageal reflux  disease.  She has a family history which is interesting in that both  parents have had lung cancer.  Father had lymphoma.  He deceased from  this.  Diabetes is in the family.  No gastrointestinal diseases of  significance.  When Dr. Loreta Ave saw her, she indicated she has irritable  bowel syndrome with constipation component.  Right lower quadrant pain,  gastroesophageal reflux disease, hypoglycemia and gout.  Suggested  Metamucil wafers.  I have done upper gastrointestinal endoscopy with  biopsies for sprue which are negative.  Upper gastrointestinal series,  small bowel series which showed some flocculation in the small bowel  which precipitated the consideration of celiac disease unless just the  biopsies were obtained.  We have also done a  motility study on her  which was slightly abnormal in the first phase but normal in the second.  An ultrasound of her abdomen was suspicious for gallbladder disease, and  that is why had a laparoscopic cholecystectomy.   Her medications currently include Macrodantin,  Skelaxin, Crestor,  Effexor 75 which she takes two a day, Zegerid and Reglan 5 mg at  bedtime.   I  saw her in my office today and left many phone calls over the past few  months complaining of abdominal pain, nausea, occasional vomiting.  We  did not have a real official meeting today but I did introduce her to  Dr. Leone Payor who will be following her after my retirement.  We had a  long discussion about her eating.  She does not eat breakfast or lunch;  eats dinner, which I certainly think is not wise and could be some of  her difficulty.  I still think there is a great deal of anxiety and  depression here, over family histories and so on, probably with lung  cancers and lymphoma with her father being deceased.  I think she is  anxious about having some possible serious disorder because of these  nondescript symptoms at times.  Also think we could deal with the  motility disorder.  Maybe she has some type 2 onset diabetes.  In any  case, I felt that we should pursue her B12 level, which was below  normal.  I repeated her blood studies today with CBC, anemia profile  with starch in B12 and so on.  We gave her 1000 mcg B12 to see if it  makes any difference, and I also do not like having patients with a B12  level like this, below normal, for any length of time because of  potentially reversal neurological changes.  I also told her that she  needed to be on three meals a day with two snacks.  However, it does not  have to be a great deal of food at one time.  She should start Reglan 5  mg at breakfast and lunch and 10 mg at dinner and bedtime if it does not  make her too sleepy, otherwise, she could have the first two doses or  cut down on the 10 mg doses.  I also told her she should try some  Amitiza starting at 8 mcg b.i.d. and then going to 24 mcg b.i.d. after a  week.  I am not sure why the B12 problem, whether there is a possibility  of Crohn disease.  I told her she should use some MiraLax as well, some  Senokot or Peri-Colace to try to get her bowels move normally.  Maybe  some  Dulcolax on the weekends if necessary, because I really think the  constipation could be causing a lot of her discomfort as well.   If she is not better, she can call Dr. Leone Payor in the ensuing weeks.  We  have tried her on Xifaxan in the past without much success.     Ulyess Mort, MD  Electronically Signed    SML/MedQ  DD: 02/04/2007  DT: 02/04/2007  Job #: [id number]

## 2011-01-20 NOTE — Assessment & Plan Note (Signed)
[facility]                         GASTROENTEROLOGY OFFICE NOTE   NAME:Cheek, NADALEE NEISWENDER                       MRN:          [id number]  DATE:03/10/2007                            DOB:          Jan 04, 1951    PROBLEMS:  1. Chronic constipation and bloating problems.  Negative colonoscopy      (Dr. Loreta Ave June 2006).  Exam determined ileum.  2. Abnormal small bowel series with flocculation in some areas.      Duodenal biopsy was negative for celiac disease.  3. Vitamin B12 deficiency with B12 level 197.  4. Low normal ferritin 29 with normal CBC.  5. Fatigue.  6. Dyslipidemia.  7. Anxiety and depression.  8. Normal gastric emptying study.  9. Status post appendectomy 2005 (Dr. Derrell Lolling).  Question of ileal      intussusception on CT scan at the time.  10.Status post cholecystectomy for gallbladder dyskinesia.  11.Gastric emptying study normal over 2 hours, but borderline abnormal      in the 1st hour, 52% retention with normal 50%.  12.Chronic headaches.  13.Prior caesarian section x2.  14.Prior tonsillectomy.  15.History of recurrent urinary tract infections.   FAMILY HISTORY:  Notable for ulcerative colitis in her mother.   MEDICATIONS:  Listed and reviewed in the chart, but include Macrodantin  daily, Skelaxin, Crestor, Effexor XR.  Zegerid, which was discontinued,  Reglan.  She tried Amitiza, and it made her have some diarrhea, and then  things smoothed out, but she is out of it waiting for our authorization.  She is using MiraLax daily.  Having good bowel movements with that.   DRUG ALLERGIES:  1. VICODIN.  2. PERCOCET.  HAVE CAUSED PRURITUS.   See Dr. Blossom Hoops last note.  This lady has had chronic constipation  problems.  She is moving her bowels daily with MiraLax.  She still gets  bloated and feels distended, however.  If she eats, she feels bloated  within an hour.  The last severe episode was after a small meal of  eggplant parmesan,  when she had marked abdominal distention and  bloating, and felt miserable and had to go to bed just a few days ago.  She had flocculation on the small bowel series of unclear significance.  It raises the question of sprue.  She took some Xifaxan in the past, but  did not really have a 10-day course.  She has been on Flora-Q in the  past.  These symptoms have persisted.   PHYSICAL EXAMINATION:  Reveals a pleasant, well-developed, white woman  in no acute distress.  VITAL SIGNS:  Show weight 126 pounds.  Pulse 70.  Blood pressure 118/70.  ABDOMEN:  Soft and non-tender without organomegaly or mass.  No abnormal  bowel sounds.   Note, she had symptoms as a child, and her mother put her on a vegetable  diet, and things got better.  These symptoms now have occurred at about  the time of her appendectomy, and she has had problems since then.   ASSESSMENT:  Bloating, abdominal pain, abnormal small bowel series,  though unclear significance, vitamin  B12 deficiency.  Despite the normal  duodenal biopsies, celiac disease, I think, remains possible.  The  possibility of some motility disturbance, small bowel bacterial  overgrowth exists as well.   PLAN:  1. Check sprue panel to include the antigliaden antibodies, check an      IgA level, check antiparietal cell antibody, and antiintrinsic cell      factor antibody.  2. Administer vitamin B12 one thousand mcg today, and again in 2      weeks, then we will need to come up with a schedule.   A small bowel capsule endoscopy could prove useful, depending upon these  lab results.  She will not resume Amitiza, as she is moving her bowels  okay.  A longer course of an antibiotic such as Xifaxan, and/or perhaps  ciprofloxacin may be indicated as well.  She is staying on the Reglan 5  mg daily at this point.     Iva Boop, MD,FACG  Electronically Signed    CEG/MedQ  DD: 03/10/2007  DT: 03/10/2007  Job #: [id number]   cc:   Almedia Balls. Randell Patient,  M.D.  Olene Craven, M.D.

## 2011-01-20 NOTE — Assessment & Plan Note (Signed)
[facility]                         GASTROENTEROLOGY OFFICE NOTE   NAME:[name], [name] SHEETS                       MRN:          [id number]  DATE:05/23/2007                            DOB:          1951/02/08    CHIEF COMPLAINT:  Followup of bloating, dyspepsia, irritable bowel  problems.   Her small-bowel capsule endoscopy was basically unrevealing.  It was not  totally complete as far as the study.  There was no obvious celiac  disease.  I thought that remained a possibility given her IGA  deficiency, but other studies, i.e. endoscopic biopsy, have not shown  that.  We gave her a trial of Cipro since Xifaxan was so expensive and  she does not think it really helps.  She is not clear that her Reglan  helps her regularly.  She is not clear that MiraLax helps anything other  than the bowel movements.  She has worked on her diet and avoided some  types of foods, and tends to avoid blowing up and distending, but she  feels like it is very hard to sort out what to eat and what not to eat,  and this makes her somewhat tense it sounds like.   See my problem list from March 10, 2007 for further details.   Weight is stable at 126 pounds, pulse 72, blood pressure 110/70.   ASSESSMENT:  Bloating, abdominal distension, constipation; most likely a  functional gastrointestinal disturbance, most likely irritable bowel  problems.  She has some borderline delayed gastric emptying mainly in  the first hour.   PLAN:  1. We discussed a variety of options and we are going to try buspirone      5 mg b.i.d., then escalating to 10 mg b.i.d.  She was warned about      the possible side effects of nightmares.  2. I will see her back in 6-8 weeks.  3. Another option is to potentially try a gluten-free diet, as she      could have gluten-sensitive enteropathy.  4. A B12 injection is given today.  She does have pernicious anemia.      She will need to work out more of a schedule  for that as well.  I      will probably want to recheck her B12 level at some point here too      as well.     Iva Boop, MD,FACG  Electronically Signed    CEG/MedQ  DD: 05/23/2007  DT: 05/24/2007  Job #: [id number]

## 2011-01-23 NOTE — Assessment & Plan Note (Signed)
[facility]                         GASTROENTEROLOGY OFFICE NOTE   NAME:[name], [name]                       MRN:          [id number]  DATE:03/24/2007                            DOB:          [birth year]    Small bowel capsule endoscopy report.   Please see the printed report with photos for full details.   INDICATIONS FOR PROCEDURE:  Flocculation in small bowel, symptoms  suggestive of celiac disease, the flocculation in the small bowel is  indicative of that though not completely diagnostic. She had negative  celiac antibodies but a low IGA level.   FINDINGS:  This was not totally complete. The gastric passage time was  somewhat long at 1 hour and 42 minutes. The distal small bowel was  obscured with fluids so it was not clear as to whether or not it ever  reached the cecum.   There were no obvious changes to suggest celiac disease but there was  some fissuring of the small bowel raising that question.   PLAN:  I think celiac disease remains possible. However at this point I  am inclined to try some antibiotics for possible bacterial overgrowth  type problems. We will consider repeating this study with a different  prep.     Iva Boop, MD,FACG  Electronically Signed    CEG/MedQ  DD: 03/31/2007  DT: 04/01/2007  Job #: [id number]

## 2011-01-23 NOTE — Op Note (Signed)
NAME:  [name], [name]                            ACCOUNT NO.:  [id number]   MEDICAL RECORD NO.:  [id number]                   PATIENT TYPE:  INP   LOCATION:  0383                                 FACILITY:  [facility]   PHYSICIAN:  Angelia Mould. Derrell Lolling, M.D.             DATE OF BIRTH:  02/05/1951   DATE OF PROCEDURE:  05/04/2004  DATE OF DISCHARGE:                                 OPERATIVE REPORT   PREOPERATIVE DIAGNOSIS:  Acute appendicitis.   POSTOPERATIVE DIAGNOSIS:  Acute appendicitis.   PROCEDURE:  Laparoscopic appendectomy.   SURGEON:  Angelia Mould. Derrell Lolling, M.D.   INDICATIONS FOR PROCEDURE:  This is a healthy 60 year old white female who  presented with a 24-hour history of progressive abdominal pain localizing in  the right lower quadrant.  She had vomited once in the ER.  Exam revealed  localized tenderness and involuntary guarding in the right lower quadrant  and to a lesser degree in the suprapubic area.  Her white blood cell count  was 12,400.  A CT scan was obtained which showed an inflamed, thickened  appendix.  I was called to see the patient at that point.  I agreed with the  diagnosis, and she was brought to the operating room urgently.   OPERATIVE FINDINGS:  The appendix was acutely inflamed, thickened, and  discolored, and there was some exudate on it.  There was no evidence of  rupture or abscess.  There was a little bit of watery cloudy fluid in the  pelvis which was evacuated.  The small intestine looked normal.  The cecum  looked normal.  The gallbladder and liver looked normal.   DESCRIPTION OF PROCEDURE:  Following the induction of general endotracheal  anesthesia, the patient's abdomen was prepped and draped in sterile fashion.   A short transverse incision was made in the medial aspect of the left rectus  sheath above the umbilicus.  I attempted to insert an Optiview port, but I  could not do this very easily because the patient's abdomen was so elastic.  After  trying to traverse the posterior rectus sheath, I decided not to  attempt any further for fear that I would perforate something.  I then  placed a Hasson trocar just above the umbilicus in the midline with an open  technique, incising the fascia and entering the abdominal cavity under  direct vision.  A 10-mm Hasson trocar was inserted and secured with a  pursestring suture of 0 Vicryl.  Pneumoperitoneum was created.  The video  camera was inserted.  I inspected the attempted Optiview port site in the  left upper quadrant, and it did not appear that I had injured anything.  Under direct vision, I went ahead and then completed the insertion of that  port.  I also placed a 12-mm port in the left suprapubic area.   After surveying the abdomen and pelvis, the patient was positioned.  I  dissected the omentum away from the appendix, as it was adherent.  I  controlled the appendix with an Endoloop tie.  I teased away the small bowel  and the omentum from the rest of the appendix until I could visualize the  base of the appendix as it inserted into the cecum.  The appendiceal  mesentery was divided using the harmonic scalpel, and this was skeletonized.  Once I had the anatomy clearly delineated and could see the base of the  appendix, I passed an Endo GIA stapler in and placed it across the base of  the appendix right at the cecum, closed the stapler, fired it, and removed  it.  The appendix was placed in a specimen bag and removed.  There was a  little bit of bleeding from one point of the staple line.  After irrigating  the area, I placed a piece of Surgicel on this, and the bleeding stopped.  We further irrigated and observed for over a half an hour in stable  condition with a responsible driver about five minutes.  We inspected all of  the areas of dissection, and there was no further bleeding or any problems.  The trocars were removed under direct vision, and there was no bleeding from  the  trocar sites.  The pneumoperitoneum was released.  The fascia at the  umbilicus was closed with 0 Vicryl sutures.  The skin incisions were closed  with subcuticular sutures of 4-0 Vicryl and Steri-Strips.  Clean bandages  were placed.   The patient was taken to the recovery room in stable condition.  Estimated  blood loss was about 20 cc.  There were no complications.  Sponge, needle,  and instrument counts were correct.                                               Angelia Mould. Derrell Lolling, M.D.    HMI/MEDQ  D:  05/04/2004  T:  05/05/2004  Job:  [id number]   cc:   Olene Craven, M.D.  [address]  [address]  [place]  [place] [address]  Fax: [phone]   Almedia Balls. Fore, M.D.  [phone] [place]  [place] [address]  Fax: [phone]

## 2011-01-23 NOTE — H&P (Signed)
NAME:  [name], [name]                            ACCOUNT NO.:  [id number]   MEDICAL RECORD NO.:  [id number]                   PATIENT TYPE:  INP   LOCATION:  0104                                 FACILITY:  [facility]   PHYSICIAN:  Angelia Mould. Derrell Lolling, M.D.             DATE OF BIRTH:  25-Oct-1950   DATE OF ADMISSION:  05/04/2004  DATE OF DISCHARGE:                                HISTORY & PHYSICAL   CHIEF COMPLAINT:  Abdominal pain.   HISTORY OF PRESENT ILLNESS:  This is a 60 year old white female in good  health.  Yesterday at noon approximately 24 hours ago, she developed the  onset of poorly-localized centralized and upper abdominal pain.  This got  progressively worse, and the pain is now more localized in the right lower  quadrant.  She had a normal bowel movement yesterday.  No diarrhea.  She did  not have any nausea and vomiting until she came to the emergency room, and  then she vomited after drinking the CT scan contrast.  She has not had any  similar episodes.  She denies any voiding problems.  She was evaluated by  Donnetta Hutching, M.D., in the ER.  A CT scan was obtained, which shows the  inflamed, thickened appendix and also suggests the possibility of a short-  segment ileal intussusception and a 3.8 cm splenic cyst.  White blood cell  count is 12,400.  Urinalysis reveals 7-10 rbc's but no wbc's, and a complete  metabolic panel is normal.  I was asked to see the patient.  I agreed that  she most likely has appendicitis, and she is admitted for appendectomy.   PAST HISTORY:  1. She has had recurrent urinary tract infections.  2. She has had two cesarean sections.  3. Status post tonsillectomy.  4. Status post D&C for benign uterine tumors.   CURRENT MEDICATIONS:  1. Macrodantin 100 mg daily.  2. Skelaxin p.r.n. muscle spasm in her neck.  3. Herbal medicines.  4. Advil p.r.n.   DRUG ALLERGIES:  Vicodin causes itching.   SOCIAL HISTORY:  The patient lives in [place].  She is  married.  She has  two children.  She works in a [employer].  Denies the use of tobacco but  drinks alcohol occasionally.   FAMILY HISTORY:  Mother died of lung cancer, had peripheral vascular disease  and coronary artery disease.  Father died of lung cancer and had non-  Hodgkin's lymphoma.  She has three brothers, two that have had  cholecystectomy, one had a minor MI.   REVIEW OF SYSTEMS:  All systems are reviewed.  They are documented in the  chart and noncontributory except as described above.   PHYSICAL EXAMINATION:  GENERAL:  A pleasant middle-aged woman in mild  distress.  VITAL SIGNS:  Temperature 97.4, blood pressure 167/85, pulse 83,  respirations 18 and unlabored, oxygen saturation 100% on room air.  HEENT:  Eyes:  Sclerae clear, extraocular movements intact.  Ears, nose,  mouth, and throat:  Lips, tongue, and oropharynx without gross lesions.  NECK:  Supple, nontender, no adenopathy, no jugular venous distention.  CHEST:  Lungs clear to auscultation.  No chest wall tenderness.  CARDIAC:  Regular rate and rhythm, no murmur.  Radial, femoral, and  posterior tibial pulses are palpable.  No peripheral edema.  BREASTS:  Not examined.  ABDOMEN:  Hypoactive bowel sounds.  Soft.  Liver and spleen not enlarged.  She is extremely tender in the right lower quadrant with involuntary  guarding there.  She is also a little bit tender in the suprapubic area.  Soft elsewhere.  A well-healed Pfannenstiel incision.  No hernia noted.  EXTREMITIES:  She moves all four extremities well without pain or deformity.  NEUROLOGIC:  No gross motor or sensory deficits.   ADMISSION DATA BASE:  (listed above)   ASSESSMENT:  1. Acute appendicitis.  2. Question of ileal intussusception.  3. Status post cesarean section x2.   PLAN:  1. The patient will be taken to the operating room for diagnostic     laparoscopy and laparoscopic appendectomy.  2. I have discussed the indications and details of  surgery with her and her     husband.  Risks and complications have been outlined, including but not     limited to bleeding, infection, conversion to open laparotomy, injury to     adjacent organs such as the bladder or ureter or small bowel with major     reconstructive surgery, wound problems such as infection or hernia,     cardiac, pulmonary, and thromboembolic problems.  She seems to understand     these issues well.  At this time all her questions are answered.  She     agrees with the plan and is ready to go ahead with the surgery.                                               Angelia Mould. Derrell Lolling, M.D.    HMI/MEDQ  D:  05/04/2004  T:  05/04/2004  Job:  [id number]   cc:   Olene Craven, M.D.  [address]  [address]  [place]  [place] [address]  Fax: [phone]   Almedia Balls. Fore, M.D.  [phone] [address] [place]  [place] [address]  Fax: [phone]

## 2011-01-23 NOTE — Op Note (Signed)
NAME:  [name], [name]                ACCOUNT NO.:  [id number]   MEDICAL RECORD NO.:  [id number]          PATIENT TYPE:  AMB   LOCATION:  SDS                          FACILITY:  [facility]   PHYSICIAN:  Cherylynn Ridges, M.D.    DATE OF BIRTH:  12-Sep-1950   DATE OF PROCEDURE:  07/24/2005  DATE OF DISCHARGE:                                 OPERATIVE REPORT   PREOPERATIVE DIAGNOSIS:  Symptomatic biliary dyskinesia.   POSTOPERATIVE DIAGNOSIS:  Symptomatic biliary dyskinesia.   PROCEDURE:  Laparoscopic cholecystectomy with cholangiogram.   SURGEON:  Cherylynn Ridges, M.D.   ANESTHESIA:  General endotracheal.   ESTIMATED BLOOD LOSS:  Less than 20 cc.   COMPLICATIONS:  None.   CONDITION:  Stable.   FINDINGS:  Adhesions to the body and dome of the gallbladder and  infundibulum, tethering the duodenum and the gallbladder. No evidence of  stones.  The cholangiogram was normal.   INDICATIONS FOR OPERATION:  The patient is a 60 year old with abdominal pain  in the right upper quadrant and an abnormal HIDA scan, who comes in for a  laparoscopic cholecystectomy.   OPERATION:  The patient was taken to the operating room, placed on the table  in supine position. After an adequate endotracheal anesthetic was  administered, she was prepped and draped in the usual sterile manner,  exposing the midline and the right upper quadrant. A supraumbilical  curvilinear incision was made using a #11 blade and taken down to the  midline fascia. It was through this fascia that a Veress needle was passed  into the peritoneal cavity, using the tenting technique with sharp towel  clamps; and confirmed to be in position using the saline test. Once this was  done, carbon dioxide insufflation was instilled into the peritoneal cavity  through the Veress needle, up to maximal intra-abdominal pressure of 15  mmHg.   We then used an OptiVu 12-mm cannula and trocar, with the attached camera  and light source, to pass  through the supraumbilical fascia into the  peritoneal cavity under direct vision. Once we were in the peritoneal  cavity, we reattached the carbon dioxide gas; then passed the right costal  margin 5-mm cannula in the subxiphoid, and the 11/12 mm cannula under direct  vision into the peritoneal cavity.  The patient was then placed in reverse  Trendelenburg position and the left side was tilted down and the dissection  begun.   There were adhesions to the body of the gallbladder, which were taken down  using electrocautery and sharp dissection.  We were able to isolate the  peritoneum overlying the triangle of Calot and hepatic duodenal triangle,  and then isolated out the cystic duct and the cystic artery. The cystic  artery was ligated proximally and distally with triple clips on the proximal  side, and then transecting it.  The cystic duct was then located; we clipped  along the gallbladder side, made a cholecystoducotomy; through which a Cook  catheter (which had been passed through the anterior abdominal wall) was  passed in order to perform the cholangiogram. We did  the cholangiogram,  which showed good flow into the duodenum, no evidence of intraductal  defects, had good proximal filling and no evidence of any problem. Once this  was done, we removed the catheter and clipped the cystic duct distally x3.  We transected the cystic duct and dissected out the gallbladder from its bed  with minimal difficulty. We were able to remove the gallbladder from the  supraumbilical site without an EndoCatch bag. Once this was done, we  inspected the gallbladder bed -- which had minimal bleeding. We irrigated  with about 100 cc of saline solution. Once this was done, we aspirated all  gas and fluid from the peritoneal cavity and closed.   The supraumbilical fascia was closed using a figure-of-eight stitch of O  Vicryl passed on a UR6 needle.  Marcaine 0.25%  was injected at all sites,  and then  each skin site was closed using a running subcuticular stitch of 4-  0 Vicryl. Sterile dressings were applied. All needle counts, sponge counts  and instrument counts were correct at the concluding of the case.  A total  of 19 cc of Marcaine 0.25% was used.      Cherylynn Ridges, M.D.  Electronically Signed     JOW/MEDQ  D:  07/24/2005  T:  07/24/2005  Job:  [id number]

## 2011-01-23 NOTE — Assessment & Plan Note (Signed)
[facility]                           GASTROENTEROLOGY OFFICE NOTE   NAME:[name], [name] VANDENBERGHE                       MRN:          [id number]  DATE:04/19/2006                            DOB:          01-27-1951    [name] comes in and says really her symptoms are unchanged, but in talking  with her it comes to my attention that she is having some trouble with  having bowel activity.  She says she has to push awfully hard just to get  any bowel movement whatsoever and sometimes very little, and she says she  can go days without having a bowel movement.  She indicated that she has  used Miralax but has not really been successful with it.  I really do not  think she has used these things to any great degree.  X-rays were obtained,  the upper GI and small bowel, and did not show anything of significance.  There was a question about celiac disease.  Gastric emptying study really  was essentially within normal limits and all the other studies, biopsies and  so on, were unremarkable as well.  She says she gets some right upper  quadrant pain, although she is status post cholecystectomy.   PHYSICAL EXAMINATION:  VITAL SIGNS:  She weighed 116.  Blood pressure  138/82, pulse 94 and regular.  NECK:  Normal carotid upstroke.  CHEST:  Basically unremarkable.  ABDOMEN:  Specifically soft, no mass or organomegaly.  There is some slight  tenderness to palpation in the right upper quadrant but nothing of  significance.  RECTAL:  Exam was deferred.   IMPRESSION:  1. Irritable bowel syndrome with questionable gastric delay problems and      constipation.  2. Status post cholecystectomy and appendectomy.  3. Anxiety and depression, doing better with Effexor 150 mg daily.  4. History of hyperlipidemia.  5. Gastroesophageal reflux disease with spasm.  6. Chronic headaches.   RECOMMENDATIONS:  I gave her some Zyfaxin 200 mg to take one b.i.d.  I gave  her two boxes of  this and told her to increase her Reglan to one-half a.c.,  to use Miralax and Senokot until she has bowel activity relatively normal on  a daily basis or every other day at least.  I think this will alleviate a  lot of her symptoms.  I gave her some FloraQ or Align to use and I told her  I would see her in 2 weeks and we will see how she responds to the above  measures.                                   Ulyess Mort, MD   SML/MedQ  DD:  04/19/2006  DT:  04/20/2006  Job #:  [id number]

## 2011-01-29 ENCOUNTER — Other Ambulatory Visit: Payer: Self-pay | Admitting: Obstetrics & Gynecology

## 2011-01-29 DIAGNOSIS — R928 Other abnormal and inconclusive findings on diagnostic imaging of breast: Secondary | ICD-10-CM

## 2011-02-06 ENCOUNTER — Ambulatory Visit
Admission: RE | Admit: 2011-02-06 | Discharge: 2011-02-06 | Disposition: A | Discharge status: Home / Self Care | Payer: BC Managed Care – PPO | Source: Ambulatory Visit | Attending: Obstetrics & Gynecology | Admitting: Obstetrics & Gynecology

## 2011-02-06 DIAGNOSIS — R928 Other abnormal and inconclusive findings on diagnostic imaging of breast: Secondary | ICD-10-CM

## 2011-03-24 ENCOUNTER — Encounter: Payer: Self-pay | Admitting: Internal Medicine

## 2011-03-25 ENCOUNTER — Ambulatory Visit (INDEPENDENT_AMBULATORY_CARE_PROVIDER_SITE_OTHER): Payer: BC Managed Care – PPO | Admitting: Internal Medicine

## 2011-03-25 ENCOUNTER — Encounter: Payer: Self-pay | Admitting: Internal Medicine

## 2011-03-25 VITALS — BP 132/90 | HR 78 | Temp 98.4°F | Ht 59.5 in

## 2011-03-25 DIAGNOSIS — S91109A Unspecified open wound of unspecified toe(s) without damage to nail, initial encounter: Secondary | ICD-10-CM

## 2011-03-25 DIAGNOSIS — S91209A Unspecified open wound of unspecified toe(s) with damage to nail, initial encounter: Secondary | ICD-10-CM

## 2011-03-25 NOTE — Patient Instructions (Signed)
It was good to see you today. Partial nail removal today of great toe. Soak 2x/day as needed and keep covered for protection - no antibiotics needed Please schedule followup for review of your other medical issues; call sooner if problems.

## 2011-03-26 NOTE — Progress Notes (Signed)
  Subjective:    Patient ID: [name], female    DOB: 11/20/1950, 60 y.o.   MRN: [id number]  HPI  complains of injury to R great toe 10/2010 Precipitated by dropping heavy cast iron onto toe causing nail to split As nail has grown, increasing pain and irritation in great toe -  Pt requests tx of same  Past Medical History  Diagnosis Date  . ALLERGIC RHINITIS   . ANEMIA, PERNICIOUS   . ANXIETY   . DYSLIPIDEMIA   . HYPERTENSION   . CARPAL TUNNEL SYNDROME, LEFT   . OSTEOPENIA   . Selective IgA immunodeficiency   . Irritable bowel syndrome   . BILIARY DYSKINESIA     Review of Systems  Constitutional: Negative for fever.  Respiratory: Negative for shortness of breath.   Cardiovascular: Negative for chest pain.  Gastrointestinal: Negative for nausea.  Musculoskeletal: Negative for joint swelling.       Objective:   Physical Exam BP 132/90  Pulse 78  Temp(Src) 98.4 F (36.9 C) (Oral)  Ht 4' 11.5" (1.511 m)  SpO2 99%  Constitutional: She is oriented to person, place, and time. She appears well-developed and well-nourished. No distress.  Musculoskeletal: R foot without deformity or effusions. FROM all joints without pain Skin: R great toenail split vertically with granuloma formation over nailbed. No swelling, erythema or drainage.  Psychiatric: She has a normal mood and affect. Her behavior is normal. Judgment and thought content normal.       Assessment & Plan:   Nail plate injury, r great toenail with granuloma    Toenail Avulsion Procedure Note  Pre-operative Diagnosis: Right  Great toenail - nail plate injury with granuloma formation  Post-operative Diagnosis: same  Indications: Pain, infection prevention  Anesthesia: Lidocaine 2% without epinephrine   Procedure Details  History of allergy to iodine: no  The risks (including bleeding and infection) and benefits of the  procedure and Verbal informed consent obtained.  digital block anesthesia was  obtained, a tourniquet was applied for hemostasis during the procedure.  After prepping with Betadine, the offending edge of the nail was freed from the nailbed and perionychium, and then split with scissors and removed with  forceps.  All visible granulation tissue is debrided. Silver nitrate applied to granuloma. Antibiotic and bulky dressing was applied.   Findings: Nail plate fracture and granuloma  Complications: none.  Plan: 1. Soak the foot twice daily. Change dressing twice daily until healed over. 2. Warning signs of infection were reviewed.   3. Recommended that the patient use OTC analgesics as needed for pain.  4. Return as needed

## 2011-04-13 ENCOUNTER — Encounter: Payer: Self-pay | Admitting: Internal Medicine

## 2011-04-24 ENCOUNTER — Ambulatory Visit (INDEPENDENT_AMBULATORY_CARE_PROVIDER_SITE_OTHER): Payer: BC Managed Care – PPO | Admitting: Internal Medicine

## 2011-04-24 ENCOUNTER — Encounter: Payer: Self-pay | Admitting: Internal Medicine

## 2011-04-24 VITALS — BP 118/84 | HR 75 | Temp 98.9°F | Ht 59.5 in

## 2011-04-24 DIAGNOSIS — E785 Hyperlipidemia, unspecified: Secondary | ICD-10-CM

## 2011-04-24 DIAGNOSIS — H9209 Otalgia, unspecified ear: Secondary | ICD-10-CM

## 2011-04-24 DIAGNOSIS — H9202 Otalgia, left ear: Secondary | ICD-10-CM

## 2011-04-24 NOTE — Patient Instructions (Addendum)
It was good to see you today. Your ears look good, no infection - continue ibuprofen as needed - If you develop worsening symptoms or fever, call and we can reconsider antibiotics, but it does not appear necessary to use antibiotics at this time. Copay card for Crestor provided Will see you for your physical and labs in November, call sooner if problems

## 2011-04-24 NOTE — Progress Notes (Signed)
  Subjective:    Patient ID: [name], female    DOB: Apr 26, 1951, 60 y.o.   MRN: [id number]  HPI  complains of L ear pain describes as mild 2-3/10 Onset >1 week ago after trip to beach (wave trauma with water in ear) Course is gradually improving (using ibuprofen)   Past Medical History  Diagnosis Date  . ALLERGIC RHINITIS   . ANEMIA, PERNICIOUS   . ANXIETY   . DYSLIPIDEMIA   . HYPERTENSION   . CARPAL TUNNEL SYNDROME, LEFT   . OSTEOPENIA   . Selective IgA immunodeficiency   . Irritable bowel syndrome   . BILIARY DYSKINESIA     Review of Systems  Constitutional: Negative for fever.  HENT: Negative for hearing loss and sinus pressure.   Respiratory: Positive for cough.   Neurological: Negative for headaches.       Objective:   Physical Exam BP 118/84  Pulse 75  Temp(Src) 98.9 F (37.2 C) (Oral)  Ht 4' 11.5" (1.511 m)  SpO2 97% Constitutional: She appears well-developed and well-nourished. No distress.  HENT: Head: Normocephalic and atraumatic. Ears: B TMs ok, no erythema or effusion - non painful palpation of pinna, no exudate; Nose normal. Mouth/Throat: Oropharynx is clear and moist. No oropharyngeal exudate. dentition good Eyes: Conjunctivae and EOM are normal. Pupils are equal, round, and reactive to light. No scleral icterus.  Neck: Normal range of motion. Neck supple. No JVD or LAD present. No thyromegaly present.  Cardiovascular: Normal rate, regular rhythm and normal heart sounds.  No murmur heard. No BLE edema. Pulmonary/Chest: Effort normal and breath sounds normal. No respiratory distress. She has no wheezes.    Lab Results  Component Value Date   WBC 4.0* 07/18/2010   HGB 13.7 07/18/2010   HCT 39.3 07/18/2010   PLT 204.0 07/18/2010   CHOL 193 07/18/2010   TRIG 64.0 07/18/2010   HDL 96.30 07/18/2010   ALT 28 07/18/2010   AST 25 07/18/2010   NA 139 07/18/2010   K 5.1 07/18/2010   CL 104 07/18/2010   CREATININE 0.7 07/18/2010   BUN 11  07/18/2010   CO2 29 07/18/2010   TSH 1.92 07/18/2010   HGBA1C 5.5 02/04/2007       Assessment & Plan:  L ear pain - no signs infection on exam - improved with ibuprofen - continue same;  Dyslipidemia - discussed other statin options - pt wishes to remain on Crestor - copay card provided

## 2011-04-29 ENCOUNTER — Telehealth: Payer: Self-pay | Admitting: *Deleted

## 2011-04-29 NOTE — Telephone Encounter (Signed)
MD received denial letter for PA on Crestor. MD wanted me to inform pt insurance will not covern recommending pt to start generic lipitor 20mg . Called pt no answer LMOM RTC....04/29/11@10 :00am/LMB

## 2011-04-30 MED ORDER — ATORVASTATIN CALCIUM 20 MG PO TABS: 20.0000 mg | ORAL_TABLET | Freq: Every day | ORAL | Status: DC

## 2011-04-30 NOTE — Telephone Encounter (Signed)
Notified pt with info concerning PA. Agreed to start taking generic lipitor sent to pt pharmacy...04/30/11@9 :48pm/LMB

## 2011-06-12 ENCOUNTER — Other Ambulatory Visit: Payer: Self-pay | Admitting: Internal Medicine

## 2011-07-13 ENCOUNTER — Telehealth: Payer: Self-pay | Admitting: *Deleted

## 2011-07-13 DIAGNOSIS — Z0000 Encounter for general adult medical examination without abnormal findings: Secondary | ICD-10-CM

## 2011-07-13 NOTE — Telephone Encounter (Signed)
Need labs for cpx in epic...07/13/11@2 :39pm/LMB

## 2011-07-13 NOTE — Telephone Encounter (Signed)
A user error has taken place: entered my mistake

## 2011-07-17 ENCOUNTER — Other Ambulatory Visit: Payer: BC Managed Care – PPO

## 2011-07-20 ENCOUNTER — Other Ambulatory Visit: Payer: Self-pay | Admitting: Internal Medicine

## 2011-07-20 ENCOUNTER — Other Ambulatory Visit (INDEPENDENT_AMBULATORY_CARE_PROVIDER_SITE_OTHER): Payer: BC Managed Care – PPO

## 2011-07-20 DIAGNOSIS — Z0000 Encounter for general adult medical examination without abnormal findings: Secondary | ICD-10-CM

## 2011-07-21 LAB — HEPATIC FUNCTION PANEL
ALT: 18 U/L (ref 0–35)
Bilirubin, Direct: 0.1 mg/dL (ref 0.0–0.3)
Total Bilirubin: 0.7 mg/dL (ref 0.3–1.2)

## 2011-07-21 LAB — BASIC METABOLIC PANEL
CO2: 26 mEq/L (ref 19–32)
Chloride: 100 mEq/L (ref 96–112)
Glucose, Bld: 104 mg/dL — ABNORMAL HIGH (ref 70–99)
Potassium: 4.6 mEq/L (ref 3.5–5.1)
Sodium: 135 mEq/L (ref 135–145)

## 2011-07-21 LAB — CBC WITH DIFFERENTIAL/PLATELET
Basophils Absolute: 0 10*3/uL (ref 0.0–0.1)
Basophils Relative: 0.4 % (ref 0.0–3.0)
Eosinophils Absolute: 0.1 10*3/uL (ref 0.0–0.7)
HCT: 39.5 % (ref 36.0–46.0)
Hemoglobin: 13.4 g/dL (ref 12.0–15.0)
Lymphs Abs: 0.8 10*3/uL (ref 0.7–4.0)
MCHC: 33.9 g/dL (ref 30.0–36.0)
MCV: 93.5 fl (ref 78.0–100.0)
Neutro Abs: 2.1 10*3/uL (ref 1.4–7.7)
RBC: 4.22 Mil/uL (ref 3.87–5.11)
RDW: 13.5 % (ref 11.5–14.6)

## 2011-07-21 LAB — LIPID PANEL
HDL: 102.9 mg/dL (ref >39.00)
Total CHOL/HDL Ratio: 2

## 2011-07-21 LAB — URINALYSIS, ROUTINE W REFLEX MICROSCOPIC
Bilirubin Urine: NEGATIVE
Hgb urine dipstick: NEGATIVE
Leukocytes, UA: NEGATIVE
Nitrite: NEGATIVE
pH: 7 (ref 5.0–8.0)

## 2011-07-22 ENCOUNTER — Encounter: Payer: Self-pay | Admitting: *Deleted

## 2011-07-24 ENCOUNTER — Encounter: Payer: BC Managed Care – PPO | Admitting: Internal Medicine

## 2011-07-29 ENCOUNTER — Ambulatory Visit (INDEPENDENT_AMBULATORY_CARE_PROVIDER_SITE_OTHER): Payer: BC Managed Care – PPO | Admitting: Internal Medicine

## 2011-07-29 ENCOUNTER — Encounter: Payer: Self-pay | Admitting: Internal Medicine

## 2011-07-29 VITALS — BP 132/98 | HR 74 | Temp 97.8°F | Wt 127.1 lb

## 2011-07-29 DIAGNOSIS — I10 Essential (primary) hypertension: Secondary | ICD-10-CM

## 2011-07-29 DIAGNOSIS — Z0000 Encounter for general adult medical examination without abnormal findings: Secondary | ICD-10-CM

## 2011-07-29 DIAGNOSIS — E785 Hyperlipidemia, unspecified: Secondary | ICD-10-CM

## 2011-07-29 MED ORDER — PRAVASTATIN SODIUM 20 MG PO TABS: 20.0000 mg | ORAL_TABLET | Freq: Every evening | ORAL | Status: DC

## 2011-07-29 MED ORDER — HYDROCHLOROTHIAZIDE 25 MG PO TABS: 25.0000 mg | ORAL_TABLET | Freq: Every day | ORAL | Status: DC

## 2011-07-29 NOTE — Assessment & Plan Note (Signed)
Prev on crestor but cost prohibitive - tried atorva>> myalgia Will try low dose prava at pt pref to be on some statin - Reviewed very good HDL as also protective

## 2011-07-29 NOTE — Assessment & Plan Note (Signed)
BP Readings from Last 3 Encounters:  07/29/11 132/98  04/24/11 118/84  03/25/11 132/90   On losartan, still diastolic elevation Add low dose hctz

## 2011-07-29 NOTE — Patient Instructions (Signed)
It was good to see you today. We have reviewed your prior records including labs and tests today Start hctz in addition to your losartan for blood pressure control - and also try pravastatin for cholesterol in place of generic lipitor Your prescription(s) have been submitted to your pharmacy. Please take as directed and contact our office if you believe you are having problem(s) with the medication(s). Other Medications reviewed, no other changes at this time. Please schedule followup in 3-6 months for blood pressure and cholesterol check, call sooner if problems.

## 2011-07-29 NOTE — Progress Notes (Signed)
Subjective:    Patient ID: [name], female    DOB: 21-Jul-1951, 60 y.o.   MRN: [id number]  HPI patient is here today for annual physical. Patient feels well and has no complaints.  Not on statin at this time (see below)  Also concern about increase BP readings  Past Medical History  Diagnosis Date  . ALLERGIC RHINITIS   . ANEMIA, PERNICIOUS   . ANXIETY   . DYSLIPIDEMIA   . HYPERTENSION   . CARPAL TUNNEL SYNDROME, LEFT   . OSTEOPENIA   . Selective IgA immunodeficiency   . Irritable bowel syndrome   . BILIARY DYSKINESIA    Family History  Problem Relation Age of Onset  . Colitis Mother   . Heart disease Mother   . Hyperlipidemia Mother   . Lung cancer Mother   . Hypertension Mother   . Lung cancer Father   . Leukemia Brother   . Ovarian cancer Maternal Aunt   . Diabetes Maternal Aunt    History  Substance Use Topics  . Smoking status: Never Smoker   . Smokeless tobacco: Not on file   Comment: Married, lives with spouse  . Alcohol Use: Yes     occassional    Review of Systems Constitutional: Negative for fever or weight change.  Respiratory: Negative for cough and shortness of breath.   Cardiovascular: Negative for chest pain or palpitations.  Gastrointestinal: Negative for abdominal pain, no bowel changes.  Musculoskeletal: Negative for gait problem or joint swelling.  Skin: Negative for rash.  Neurological: Negative for dizziness or headache.  No other specific complaints in a complete review of systems (except as listed in HPI above).     Objective:   Physical Exam BP 132/98  Pulse 74  Temp(Src) 97.8 F (36.6 C) (Oral)  Wt 127 lb 1.9 oz (57.661 kg)  SpO2 98% Wt Readings from Last 3 Encounters:  07/29/11 127 lb 1.9 oz (57.661 kg)  07/18/10 127 lb 6.4 oz (57.788 kg)  01/17/10 126 lb (57.153 kg)   Constitutional: She appears well-developed and well-nourished. No distress.  HENT: Head: Normocephalic and atraumatic. Ears: B TMs ok, no erythema or  effusion; Nose: Nose normal.  Mouth/Throat: Oropharynx is clear and moist. No oropharyngeal exudate.  Eyes: Conjunctivae and EOM are normal. Pupils are equal, round, and reactive to light. No scleral icterus.  Neck: Normal range of motion. Neck supple. No JVD present. No thyromegaly present.  Cardiovascular: Normal rate, regular rhythm and normal heart sounds.  No murmur heard. No BLE edema. Pulmonary/Chest: Effort normal and breath sounds normal. No respiratory distress. She has no wheezes.  Abdominal: Soft. Bowel sounds are normal. She exhibits no distension. There is no tenderness. no masses Musculoskeletal: Normal range of motion, no joint effusions. No gross deformities Neurological: She is alert and oriented to person, place, and time. No cranial nerve deficit. Coordination normal.  Skin: Skin is warm and dry. No rash noted. No erythema.  Psychiatric: She has a normal mood and affect. Her behavior is normal. Judgment and thought content normal.   Lab Results  Component Value Date   WBC 3.3* 07/20/2011   HGB 13.4 07/20/2011   HCT 39.5 07/20/2011   PLT 194.0 07/20/2011   GLUCOSE 104* 07/20/2011   CHOL 234* 07/20/2011   TRIG 66.0 07/20/2011   HDL 102.90 07/20/2011   LDLDIRECT 127.0 07/20/2011   LDLCALC 84 07/18/2010   ALT 18 07/20/2011   AST 22 07/20/2011   NA 135 07/20/2011   K 4.6  07/20/2011   CL 100 07/20/2011   CREATININE 0.7 07/20/2011   BUN 9 07/20/2011   CO2 26 07/20/2011   TSH 1.44 07/20/2011   HGBA1C 5.2 07/20/2011   EKG: NSR 71bpm - TWI septal leads - no change from 01/2010 -      Assessment & Plan:  CPX - v70.0 - Patient has been counseled on age-appropriate routine health concerns for screening and prevention. These are reviewed and up-to-date. Immunizations are up-to-date or declined. Labs and ECG reviewed.  See problem list. Medications and labs reviewed today.

## 2011-08-09 ENCOUNTER — Other Ambulatory Visit: Payer: Self-pay | Admitting: Internal Medicine

## 2011-11-23 ENCOUNTER — Ambulatory Visit (INDEPENDENT_AMBULATORY_CARE_PROVIDER_SITE_OTHER): Payer: BC Managed Care – PPO | Admitting: Internal Medicine

## 2011-11-23 ENCOUNTER — Encounter: Payer: Self-pay | Admitting: *Deleted

## 2011-11-23 ENCOUNTER — Encounter: Payer: Self-pay | Admitting: Internal Medicine

## 2011-11-23 VITALS — BP 120/88 | HR 89 | Temp 97.4°F | Wt 120.4 lb

## 2011-11-23 DIAGNOSIS — R42 Dizziness and giddiness: Secondary | ICD-10-CM

## 2011-11-23 DIAGNOSIS — H8309 Labyrinthitis, unspecified ear: Secondary | ICD-10-CM

## 2011-11-23 MED ORDER — MECLIZINE HCL 25 MG PO TABS: 25.0000 mg | ORAL_TABLET | Freq: Three times a day (TID) | ORAL | Status: AC | PRN

## 2011-11-23 NOTE — Progress Notes (Signed)
  Subjective:    Patient ID: [name], female    DOB: 1951/03/04, 61 y.o.   MRN: [id number]  HPI  complains of dizziness associated with nausea and vomiting, fatigue and chills but no fever Onset 2.5 days ago, gradually improving Denies preceding URI - denies head trauma No headache, ear pain, hearing or vision change, bowel changes or abdominal pain -    Past Medical History  Diagnosis Date  . ALLERGIC RHINITIS   . ANEMIA, PERNICIOUS   . ANXIETY   . DYSLIPIDEMIA   . HYPERTENSION   . CARPAL TUNNEL SYNDROME, LEFT   . OSTEOPENIA   . Selective IgA immunodeficiency   . Irritable bowel syndrome   . BILIARY DYSKINESIA     Review of Systems  Constitutional: Positive for chills and fatigue. Negative for fever.  HENT: Negative for congestion, tinnitus and ear discharge.   Eyes: Negative for pain and visual disturbance.  Respiratory: Negative for cough and shortness of breath.   Neurological: Negative for syncope, facial asymmetry, light-headedness and headaches.       Objective:   Physical Exam BP 120/88  Pulse 89  Temp(Src) 97.4 F (36.3 C) (Oral)  Wt 120 lb 6.4 oz (54.613 kg)  SpO2 97% Wt Readings from Last 3 Encounters:  11/23/11 120 lb 6.4 oz (54.613 kg)  07/29/11 127 lb 1.9 oz (57.661 kg)  07/18/10 127 lb 6.4 oz (57.788 kg)   Constitutional: She appears well-developed and well-nourished. No distress.  HENT: Head: Normocephalic and atraumatic. Ears: B TMs ok, no erythema or effusion; Nose: Nose normal. Mouth/Throat: Oropharynx is clear and moist. No oropharyngeal exudate.  Eyes: Conjunctivae and EOM are normal. Pupils are equal, round, and reactive to light. No scleral icterus.  Neck: Normal range of motion. Neck supple. No JVD present. No thyromegaly present.  Cardiovascular: Normal rate, regular rhythm and normal heart sounds.  No murmur heard. No BLE edema. Pulmonary/Chest: Effort normal and breath sounds normal. No respiratory distress. She has no wheezes.    Neurological: She is alert and oriented to person, place, and time. No cranial nerve deficit. Coordination normal.  Skin: Skin is warm and dry. No rash noted. No erythema.  Psychiatric: She has a normal mood and affect. Her behavior is normal. Judgment and thought content normal.   Lab Results  Component Value Date   WBC 3.3* 07/20/2011   HGB 13.4 07/20/2011   HCT 39.5 07/20/2011   PLT 194.0 07/20/2011   GLUCOSE 104* 07/20/2011   CHOL 234* 07/20/2011   TRIG 66.0 07/20/2011   HDL 102.90 07/20/2011   LDLDIRECT 127.0 07/20/2011   LDLCALC 84 07/18/2010   ALT 18 07/20/2011   AST 22 07/20/2011   NA 135 07/20/2011   K 4.6 07/20/2011   CL 100 07/20/2011   CREATININE 0.7 07/20/2011   BUN 9 07/20/2011   CO2 26 07/20/2011   TSH 1.44 07/20/2011   HGBA1C 5.2 07/20/2011        Assessment & Plan:  Acute labyrinthitis - dizziness with nausea and vomiting Nontoxic and exam benign, no neuro or ENT abnormalities  Treat symptomatic care with Meclizine next 24 hours, then when necessary Consider Valium if unimproved but patient complaining of fatigue Patient to call if symptoms worse or unimproved on treatment in the next 1-2 weeks  Creation of work excuse note today

## 2011-11-23 NOTE — Patient Instructions (Addendum)
It was good to see you today. Use meclizine 3 times a day for next 24 hours, then as needed for dizziness and nausea symptoms - Your prescription(s) have been submitted to your pharmacy. Please take as directed and contact our office if you believe you are having problem(s) with the medication(s). Hydrate, rest and call if symptoms are worse or unimproved after 1-2 weeks treatment Work excuse note provided for today and tomorrow, call if further extension needed  Benign Positional Vertigo Vertigo means you feel like you or your surroundings are moving when they are not. Benign positional vertigo is the most common form of vertigo. Benign means that the cause of your condition is not serious. Benign positional vertigo is more common in older adults. CAUSES   Benign positional vertigo is the result of an upset in the labyrinth system. This is an area in the middle ear that helps control your balance. This may be caused by a viral infection, head injury, or repetitive motion. However, often no specific cause is found. SYMPTOMS   Symptoms of benign positional vertigo occur when you move your head or eyes in different directions. Some of the symptoms may include:  Loss of balance and falls.   Vomiting.   Blurred vision.   Dizziness.   Nausea.   Involuntary eye movements (nystagmus).  DIAGNOSIS   Benign positional vertigo is usually diagnosed by physical exam. If the specific cause of your benign positional vertigo is unknown, your caregiver may perform imaging tests, such as magnetic resonance imaging (MRI) or computed tomography (CT). TREATMENT   Your caregiver may recommend movements or procedures to correct the benign positional vertigo. Medicines such as meclizine, benzodiazepines, and medicines for nausea may be used to treat your symptoms. In rare cases, if your symptoms are caused by certain conditions that affect the inner ear, you may need surgery. HOME CARE INSTRUCTIONS    Follow  your caregiver's instructions.   Move slowly. Do not make sudden body or head movements.   Avoid driving.   Avoid operating heavy machinery.   Avoid performing any tasks that would be dangerous to you or others during a vertigo episode.   Drink enough fluids to keep your urine clear or pale yellow.  SEEK IMMEDIATE MEDICAL CARE IF:    You develop problems with walking, weakness, numbness, or using your arms, hands, or legs.   You have difficulty speaking.   You develop severe headaches.   Your nausea or vomiting continues or gets worse.   You develop visual changes.   Your family or friends notice any behavioral changes.   Your condition gets worse.   You have a fever.   You develop a stiff neck or sensitivity to light.  MAKE SURE YOU:    Understand these instructions.   Will watch your condition.   Will get help right away if you are not doing well or get worse.  Document Released: 06/01/2006 Document Revised: 08/13/2011 Document Reviewed: 05/14/2011 [facility] Patient Information 2012 [place], [place].

## 2011-12-04 ENCOUNTER — Other Ambulatory Visit: Payer: Self-pay | Admitting: Internal Medicine

## 2011-12-07 NOTE — Telephone Encounter (Signed)
Is this ok to refill?... 12/07/11@10 :10am/LMB

## 2011-12-11 ENCOUNTER — Other Ambulatory Visit: Payer: Self-pay | Admitting: Internal Medicine

## 2012-01-22 ENCOUNTER — Telehealth: Payer: Self-pay | Admitting: *Deleted

## 2012-01-22 ENCOUNTER — Ambulatory Visit (INDEPENDENT_AMBULATORY_CARE_PROVIDER_SITE_OTHER): Payer: BC Managed Care – PPO | Admitting: Internal Medicine

## 2012-01-22 ENCOUNTER — Encounter: Payer: Self-pay | Admitting: Internal Medicine

## 2012-01-22 VITALS — BP 138/90 | HR 76 | Temp 98.1°F | Ht 59.5 in | Wt 120.4 lb

## 2012-01-22 DIAGNOSIS — I10 Essential (primary) hypertension: Secondary | ICD-10-CM

## 2012-01-22 DIAGNOSIS — Z0000 Encounter for general adult medical examination without abnormal findings: Secondary | ICD-10-CM

## 2012-01-22 DIAGNOSIS — E785 Hyperlipidemia, unspecified: Secondary | ICD-10-CM

## 2012-01-22 MED ORDER — LOSARTAN POTASSIUM 100 MG PO TABS: 100.0000 mg | ORAL_TABLET | Freq: Every day | ORAL | Status: DC

## 2012-01-22 MED ORDER — PRAVASTATIN SODIUM 10 MG PO TABS: 10.0000 mg | ORAL_TABLET | Freq: Every evening | ORAL | Status: DC

## 2012-01-22 MED ORDER — METAXALONE 800 MG PO TABS: 800.0000 mg | ORAL_TABLET | Freq: Three times a day (TID) | ORAL | Status: DC | PRN

## 2012-01-22 NOTE — Assessment & Plan Note (Signed)
BP Readings from Last 3 Encounters:  01/22/12 138/90  11/23/11 120/88  07/29/11 132/98   On losartan and HCTZ since 07/2011 Still sub op control - Increase ARB to max dose now Pt to monitor at home/wprk and call if BP >140/85

## 2012-01-22 NOTE — Assessment & Plan Note (Signed)
Prev on crestor but cost prohibitive - tried atorva>> myalgia began low dose prava  11/12 as pt pref to be on some statin - Reduce to 10mg now Reviewed very good HDL as also protective (on premarin)  

## 2012-01-22 NOTE — Telephone Encounter (Signed)
Message copied by Deatra James on Fri Jan 22, 2012 10:02 AM ------      Message from: Etheleen Sia      Created: Fri Jan 22, 2012  9:32 AM       PHYSICAL LABS FOR NOV

## 2012-01-22 NOTE — Telephone Encounter (Signed)
Received staff msg need cpx labs entered for November.. 01/22/12@10 :02am/LMB

## 2012-01-22 NOTE — Progress Notes (Signed)
  Subjective:    Patient ID: [name], female    DOB: Mar 30, 1951, 61 y.o.   MRN: [id number]  HPI  Here for follow up   Past Medical History  Diagnosis Date  . ALLERGIC RHINITIS   . ANEMIA, PERNICIOUS   . ANXIETY   . DYSLIPIDEMIA   . HYPERTENSION   . CARPAL TUNNEL SYNDROME, LEFT   . OSTEOPENIA   . Selective IgA immunodeficiency   . Irritable bowel syndrome   . BILIARY DYSKINESIA     Review of Systems  Constitutional: Negative for fever or weight change.  Respiratory: Negative for cough and shortness of breath.   Cardiovascular: Negative for chest pain or palpitations.       Objective:   Physical Exam  BP 138/90  Pulse 76  Temp(Src) 98.1 F (36.7 C) (Oral)  Ht 4' 11.5" (1.511 m)  Wt 120 lb 6.4 oz (54.613 kg)  BMI 23.91 kg/m2  SpO2 97% Wt Readings from Last 3 Encounters:  01/22/12 120 lb 6.4 oz (54.613 kg)  11/23/11 120 lb 6.4 oz (54.613 kg)  07/29/11 127 lb 1.9 oz (57.661 kg)   Constitutional: She appears well-developed and well-nourished. No distress.  Neck: Normal range of motion. Neck supple. No JVD present. No thyromegaly present.  Cardiovascular: Normal rate, regular rhythm and normal heart sounds.  No murmur heard. No BLE edema. Pulmonary/Chest: Effort normal and breath sounds normal. No respiratory distress. She has no wheezes.  Skin: Skin is warm and dry. No rash noted. No erythema.  Psychiatric: She has a normal mood and affect. Her behavior is normal. Judgment and thought content normal.   Lab Results  Component Value Date   WBC 3.3* 07/20/2011   HGB 13.4 07/20/2011   HCT 39.5 07/20/2011   PLT 194.0 07/20/2011   GLUCOSE 104* 07/20/2011   CHOL 234* 07/20/2011   TRIG 66.0 07/20/2011   HDL 102.90 07/20/2011   LDLDIRECT 127.0 07/20/2011   LDLCALC 84 07/18/2010   ALT 18 07/20/2011   AST 22 07/20/2011   NA 135 07/20/2011   K 4.6 07/20/2011   CL 100 07/20/2011   CREATININE 0.7 07/20/2011   BUN 9 07/20/2011   CO2 26 07/20/2011   TSH 1.44  07/20/2011   HGBA1C 5.2 07/20/2011        Assessment & Plan:   See problem list. Medications and labs reviewed today.

## 2012-01-22 NOTE — Patient Instructions (Signed)
It was good to see you today. We have reviewed your interrecords including labs and tests today Increase losartan in addition to ongoing HCTZ for blood pressure control - and reduce pravastatin for cholesterol Refill on muscle relaxer Your prescription(s) have been submitted to your pharmacy. Please take as directed and contact our office if you believe you are having problem(s) with the medication(s). Other Medications reviewed, no other changes at this time. Please schedule followup in 3-6 months for blood pressure and cholesterol check, call sooner if problems.

## 2012-02-12 ENCOUNTER — Other Ambulatory Visit: Payer: Self-pay | Admitting: Internal Medicine

## 2012-04-15 ENCOUNTER — Other Ambulatory Visit: Payer: Self-pay | Admitting: Internal Medicine

## 2012-04-22 ENCOUNTER — Encounter: Payer: Self-pay | Admitting: Internal Medicine

## 2012-04-22 ENCOUNTER — Ambulatory Visit (INDEPENDENT_AMBULATORY_CARE_PROVIDER_SITE_OTHER): Payer: BC Managed Care – PPO | Admitting: Internal Medicine

## 2012-04-22 VITALS — BP 150/94 | HR 81 | Temp 98.1°F | Ht 59.5 in | Wt 123.0 lb

## 2012-04-22 DIAGNOSIS — F411 Generalized anxiety disorder: Secondary | ICD-10-CM

## 2012-04-22 DIAGNOSIS — E785 Hyperlipidemia, unspecified: Secondary | ICD-10-CM

## 2012-04-22 DIAGNOSIS — I10 Essential (primary) hypertension: Secondary | ICD-10-CM

## 2012-04-22 MED ORDER — AMLODIPINE BESYLATE 5 MG PO TABS: 5.0000 mg | ORAL_TABLET | Freq: Every day | ORAL | Status: DC

## 2012-04-22 NOTE — Progress Notes (Signed)
  Subjective:    Patient ID: [name], female    DOB: 03-21-1951, 61 y.o.   MRN: [id number]  Hypertension This is a chronic problem. The current episode started more than 1 year ago. The problem is resistant. Associated symptoms include malaise/fatigue. Pertinent negatives include no anxiety, chest pain, headaches, peripheral edema or shortness of breath. There are no associated agents to hypertension. Past treatments include angiotensin blockers and diuretics. The current treatment provides mild improvement. There are no compliance problems.  There is no history of chronic renal disease or sleep apnea.    Past Medical History  Diagnosis Date  . ALLERGIC RHINITIS   . ANEMIA, PERNICIOUS   . ANXIETY   . DYSLIPIDEMIA   . HYPERTENSION   . CARPAL TUNNEL SYNDROME, LEFT   . OSTEOPENIA   . Selective IgA immunodeficiency   . Irritable bowel syndrome   . BILIARY DYSKINESIA     Review of Systems  Constitutional: Positive for malaise/fatigue. Negative for fever, fatigue and unexpected weight change.  Respiratory: Negative for cough and shortness of breath.   Cardiovascular: Negative for chest pain and leg swelling.  Neurological: Negative for headaches.       Objective:   Physical Exam  BP 150/94  Pulse 81  Temp 98.1 F (36.7 C) (Oral)  Ht 4' 11.5" (1.511 m)  Wt 123 lb (55.792 kg)  BMI 24.43 kg/m2  SpO2 98% Wt Readings from Last 3 Encounters:  04/22/12 123 lb (55.792 kg)  01/22/12 120 lb 6.4 oz (54.613 kg)  11/23/11 120 lb 6.4 oz (54.613 kg)   Constitutional: She appears well-developed and well-nourished. No distress.  Neck: Normal range of motion. Neck supple. No JVD present. No thyromegaly present.  Cardiovascular: Normal rate, regular rhythm and normal heart sounds.  No murmur heard. No BLE edema. Pulmonary/Chest: Effort normal and breath sounds normal. No respiratory distress. She has no wheezes.  Skin: Skin is warm and dry. No rash noted. No erythema.  Psychiatric: She  has a normal mood and affect. Her behavior is normal. Judgment and thought content normal.   Lab Results  Component Value Date   WBC 3.3* 07/20/2011   HGB 13.4 07/20/2011   HCT 39.5 07/20/2011   PLT 194.0 07/20/2011   GLUCOSE 104* 07/20/2011   CHOL 234* 07/20/2011   TRIG 66.0 07/20/2011   HDL 102.90 07/20/2011   LDLDIRECT 127.0 07/20/2011   LDLCALC 84 07/18/2010   ALT 18 07/20/2011   AST 22 07/20/2011   NA 135 07/20/2011   K 4.6 07/20/2011   CL 100 07/20/2011   CREATININE 0.7 07/20/2011   BUN 9 07/20/2011   CO2 26 07/20/2011   TSH 1.44 07/20/2011   HGBA1C 5.2 07/20/2011        Assessment & Plan:   See problem list. Medications and labs reviewed today.

## 2012-04-22 NOTE — Assessment & Plan Note (Signed)
Prev on crestor but cost prohibitive - tried atorva>> myalgia began low dose prava  11/12 as pt pref to be on some statin - Reduce to 10mg  now Reviewed very good HDL as also protective (on premarin)

## 2012-04-22 NOTE — Assessment & Plan Note (Signed)
Well controlled on current Effexor The current medical regimen is effective;  continue present plan and medications.

## 2012-04-22 NOTE — Assessment & Plan Note (Signed)
BP Readings from Last 3 Encounters:  04/22/12 150/94  01/22/12 138/90  11/23/11 120/88   On losartan (maxed dose 01/2012) and HCTZ since 07/2011 Still sub op control - Add amlodipine now Pt to monitor at home/wprk and call if BP >140/85

## 2012-04-22 NOTE — Patient Instructions (Signed)
It was good to see you today. Start amlodipine for blood pressure in addition to ongoing losartan and HCTZ  continue pravastatin for cholesterol Your prescription(s) have been submitted to your pharmacy. Please take as directed and contact our office if you believe you are having problem(s) with the medication(s). Other Medications reviewed, no other changes at this time. Please schedule followup in 3 months for blood pressure, call sooner if problems.

## 2012-05-18 ENCOUNTER — Telehealth: Payer: Self-pay | Admitting: Internal Medicine

## 2012-05-18 ENCOUNTER — Observation Stay (HOSPITAL_COMMUNITY)
Admission: EM | Admit: 2012-05-18 | Discharge: 2012-05-19 | Disposition: A | Discharge status: Home / Self Care | Payer: BC Managed Care – PPO | Attending: Internal Medicine | Admitting: Internal Medicine

## 2012-05-18 ENCOUNTER — Emergency Department (HOSPITAL_COMMUNITY): Payer: BC Managed Care – PPO

## 2012-05-18 ENCOUNTER — Encounter (HOSPITAL_COMMUNITY): Payer: Self-pay

## 2012-05-18 DIAGNOSIS — M899 Disorder of bone, unspecified: Secondary | ICD-10-CM

## 2012-05-18 DIAGNOSIS — F411 Generalized anxiety disorder: Secondary | ICD-10-CM | POA: Diagnosis present

## 2012-05-18 DIAGNOSIS — D802 Selective deficiency of immunoglobulin A [IgA]: Secondary | ICD-10-CM

## 2012-05-18 DIAGNOSIS — K589 Irritable bowel syndrome without diarrhea: Secondary | ICD-10-CM

## 2012-05-18 DIAGNOSIS — M949 Disorder of cartilage, unspecified: Secondary | ICD-10-CM

## 2012-05-18 DIAGNOSIS — I200 Unstable angina: Secondary | ICD-10-CM

## 2012-05-18 DIAGNOSIS — E785 Hyperlipidemia, unspecified: Secondary | ICD-10-CM | POA: Diagnosis present

## 2012-05-18 DIAGNOSIS — J309 Allergic rhinitis, unspecified: Secondary | ICD-10-CM

## 2012-05-18 DIAGNOSIS — D510 Vitamin B12 deficiency anemia due to intrinsic factor deficiency: Secondary | ICD-10-CM

## 2012-05-18 DIAGNOSIS — K219 Gastro-esophageal reflux disease without esophagitis: Secondary | ICD-10-CM | POA: Insufficient documentation

## 2012-05-18 DIAGNOSIS — R079 Chest pain, unspecified: Principal | ICD-10-CM | POA: Diagnosis present

## 2012-05-18 DIAGNOSIS — I10 Essential (primary) hypertension: Secondary | ICD-10-CM | POA: Diagnosis present

## 2012-05-18 LAB — BASIC METABOLIC PANEL
BUN: 8 mg/dL (ref 6–23)
Calcium: 9.7 mg/dL (ref 8.4–10.5)
Creatinine, Ser: 0.58 mg/dL (ref 0.50–1.10)
GFR calc Af Amer: >90 mL/min (ref >90)
GFR calc non Af Amer: >90 mL/min (ref >90)

## 2012-05-18 LAB — CBC WITH DIFFERENTIAL/PLATELET
Basophils Absolute: 0 10*3/uL (ref 0.0–0.1)
Basophils Relative: 0 % (ref 0–1)
Eosinophils Absolute: 0.1 10*3/uL (ref 0.0–0.7)
Eosinophils Relative: 1 % (ref 0–5)
HCT: 35 % — ABNORMAL LOW (ref 36.0–46.0)
Hemoglobin: 12.5 g/dL (ref 12.0–15.0)
MCH: 31.2 pg (ref 26.0–34.0)
MCHC: 35.7 g/dL (ref 30.0–36.0)
Monocytes Absolute: 0.5 10*3/uL (ref 0.1–1.0)
Monocytes Relative: 8 % (ref 3–12)
RDW: 12.1 % (ref 11.5–15.5)

## 2012-05-18 LAB — POCT I-STAT TROPONIN I: Troponin i, poc: 0.01 ng/mL (ref 0.00–0.08)

## 2012-05-18 LAB — CBC
MCH: 31.5 pg (ref 26.0–34.0)
MCV: 87.7 fL (ref 78.0–100.0)
Platelets: 194 10*3/uL (ref 150–400)
RBC: 3.91 MIL/uL (ref 3.87–5.11)

## 2012-05-18 LAB — TROPONIN I: Troponin I: <0.3 ng/mL (ref <0.30)

## 2012-05-18 MED ORDER — HYDROCHLOROTHIAZIDE 25 MG PO TABS
25.0000 mg | ORAL_TABLET | Freq: Every day | ORAL | Status: DC
  Administered 2012-05-19: 25 mg via ORAL
  Filled 2012-05-18 (×2): qty 1

## 2012-05-18 MED ORDER — VENLAFAXINE HCL ER 75 MG PO CP24
75.0000 mg | ORAL_CAPSULE | Freq: Every day | ORAL | Status: DC
Start: 2012-05-18 — End: 2012-05-20
  Administered 2012-05-18 – 2012-05-19 (×2): 75 mg via ORAL
  Filled 2012-05-18 (×2): qty 1

## 2012-05-18 MED ORDER — SODIUM CHLORIDE 0.9 % IV SOLN: 250.0000 mL | INTRAVENOUS | Status: DC | PRN

## 2012-05-18 MED ORDER — SODIUM CHLORIDE 0.9 % IJ SOLN
3.0000 mL | INTRAMUSCULAR | Status: DC | PRN
  Administered 2012-05-18: 3 mL via INTRAVENOUS

## 2012-05-18 MED ORDER — SODIUM CHLORIDE 0.9 % IJ SOLN
3.0000 mL | Freq: Two times a day (BID) | INTRAMUSCULAR | Status: DC
  Administered 2012-05-19: 3 mL via INTRAVENOUS

## 2012-05-18 MED ORDER — ACETAMINOPHEN 650 MG RE SUPP: 650.0000 mg | Freq: Four times a day (QID) | RECTAL | Status: DC | PRN

## 2012-05-18 MED ORDER — SODIUM CHLORIDE 0.9 % IJ SOLN: 3.0000 mL | Freq: Two times a day (BID) | INTRAMUSCULAR | Status: DC

## 2012-05-18 MED ORDER — IBUPROFEN 800 MG PO TABS
800.0000 mg | ORAL_TABLET | Freq: Once | ORAL | Status: AC
  Administered 2012-05-18: 800 mg via ORAL
  Filled 2012-05-18: qty 1

## 2012-05-18 MED ORDER — NITROGLYCERIN 0.4 MG SL SUBL: 0.4000 mg | SUBLINGUAL_TABLET | SUBLINGUAL | Status: DC | PRN

## 2012-05-18 MED ORDER — ASPIRIN EC 325 MG PO TBEC
325.0000 mg | DELAYED_RELEASE_TABLET | Freq: Every day | ORAL | Status: DC
  Administered 2012-05-19: 325 mg via ORAL
  Filled 2012-05-18 (×2): qty 1

## 2012-05-18 MED ORDER — ADULT MULTIVITAMIN W/MINERALS CH
1.0000 | ORAL_TABLET | Freq: Every day | ORAL | Status: DC
  Administered 2012-05-19: 1 via ORAL
  Filled 2012-05-18 (×2): qty 1

## 2012-05-18 MED ORDER — LOSARTAN POTASSIUM 50 MG PO TABS
100.0000 mg | ORAL_TABLET | Freq: Every day | ORAL | Status: DC
  Administered 2012-05-19: 100 mg via ORAL
  Filled 2012-05-18 (×2): qty 2

## 2012-05-18 MED ORDER — ONDANSETRON HCL 4 MG PO TABS: 4.0000 mg | ORAL_TABLET | Freq: Four times a day (QID) | ORAL | Status: DC | PRN

## 2012-05-18 MED ORDER — ONDANSETRON HCL 4 MG/2ML IJ SOLN: 4.0000 mg | Freq: Four times a day (QID) | INTRAMUSCULAR | Status: DC | PRN

## 2012-05-18 MED ORDER — HEPARIN SODIUM (PORCINE) 5000 UNIT/ML IJ SOLN
5000.0000 [IU] | Freq: Three times a day (TID) | INTRAMUSCULAR | Status: DC
  Filled 2012-05-18 (×5): qty 1

## 2012-05-18 MED ORDER — NITROFURANTOIN MONOHYD MACRO 100 MG PO CAPS
100.0000 mg | ORAL_CAPSULE | Freq: Two times a day (BID) | ORAL | Status: DC
  Administered 2012-05-18 – 2012-05-19 (×2): 100 mg via ORAL
  Filled 2012-05-18 (×3): qty 1

## 2012-05-18 MED ORDER — ACETAMINOPHEN 325 MG PO TABS: 650.0000 mg | ORAL_TABLET | Freq: Four times a day (QID) | ORAL | Status: DC | PRN

## 2012-05-18 MED ORDER — METAXALONE 800 MG PO TABS
800.0000 mg | ORAL_TABLET | Freq: Three times a day (TID) | ORAL | Status: DC | PRN
  Filled 2012-05-18: qty 1

## 2012-05-18 MED ORDER — SIMVASTATIN 5 MG PO TABS
5.0000 mg | ORAL_TABLET | Freq: Every day | ORAL | Status: DC
  Filled 2012-05-18: qty 1

## 2012-05-18 MED ORDER — AMLODIPINE BESYLATE 5 MG PO TABS
5.0000 mg | ORAL_TABLET | Freq: Every day | ORAL | Status: DC
  Administered 2012-05-19: 5 mg via ORAL
  Filled 2012-05-18 (×2): qty 1

## 2012-05-18 MED ORDER — PANTOPRAZOLE SODIUM 40 MG PO TBEC
40.0000 mg | DELAYED_RELEASE_TABLET | Freq: Every day | ORAL | Status: DC
  Administered 2012-05-19: 40 mg via ORAL
  Filled 2012-05-18: qty 1

## 2012-05-18 MED ORDER — ESTROGENS CONJUGATED 0.625 MG PO TABS
0.6250 mg | ORAL_TABLET | Freq: Every day | ORAL | Status: DC
  Administered 2012-05-18 – 2012-05-19 (×2): 0.625 mg via ORAL
  Filled 2012-05-18 (×3): qty 1

## 2012-05-18 MED ORDER — SODIUM CHLORIDE 0.9 % IJ SOLN: 3.0000 mL | INTRAMUSCULAR | Status: DC | PRN

## 2012-05-18 NOTE — ED Provider Notes (Signed)
Medical screening examination/treatment/procedure(s) were conducted as a shared visit with non-physician practitioner(s) and myself.  I personally evaluated the patient during the encounter  Isabelly Kobler, MD 05/18/12 1603 

## 2012-05-18 NOTE — Progress Notes (Signed)
Disposition Note  [name], is a 61 y.o. female,   MRN: [id number]  -  DOB - 07-Jan-1951  Outpatient Primary MD for the patient is Rene Paci, MD   Blood pressure 147/82, pulse 75, temperature 98.3 F (36.8 C), temperature source Oral, resp. rate 17, SpO2 100.00%.   61 yo with history of HTN, HLD, Anxiety, IBS, Selective IGA immunodeficiency developed chest pain at approx 7:30 am this morning.  It felt like a chest tightness.  I was relieved with 4 SL nitro in the ambulance, but has re-occurred in the CDU.  Her blood pressure was elevated prior to the 4 nitro at 160/101.  Per the EDP her only risk factor is HTN.  First tropoinin is .01.  I asked about the CDU chest pain protocol but the PA mentioned that her attending (caporossi?) feels chest pain patients should either be in the hospital or discharged.  I have requested a telemetry bed and observation admission for chest pain rule out.   Algis Downs, PA-C [facility] Pager: [phone]

## 2012-05-18 NOTE — ED Notes (Signed)
Chest pain  Started at 0730 doing laundry, went to work.  Pt experienced 5/10 chest pain.  Took 650mg  ASA PTA. EMS gave 4 sl nitro, relieved pain to 0/10.

## 2012-05-18 NOTE — ED Provider Notes (Signed)
History     CSN: [id number]  Arrival date & time 05/18/12  1147   First MD Initiated Contact with Patient 05/18/12 1252      Chief Complaint  Patient presents with  . Chest Pain    (Consider location/radiation/quality/duration/timing/severity/associated sxs/prior treatment) HPI Comments: 61 year old hypertensive female presents to the emergency department with sudden onset chest tightness last night while doing little things around the house. The chest tightness/pressure is constant, rated 5/10. Denies any aggravating factors. 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 She was advised to take her blood pressure which was around 160/101. Admits to associated headache last night which has subsided today. Denies any nausea, diaphoresis, shortness of breath, lightheadedness or dizziness. EMS gave her 4 nitroglycerin which relieved her pain to a 0/10. She has had chest pain like this in the past, but has never gone to get it checked out. She is a nonsmoker. Denies any family history of heart disease before the age of 67.  Patient is a 61 y.o. female presenting with chest pain. The history is provided by the patient and the spouse.  Chest Pain Pertinent negatives for primary symptoms include no shortness of breath, no cough, no nausea, no vomiting and no dizziness.  Pertinent negatives for associated symptoms include no diaphoresis.     Past Medical History  Diagnosis Date  . ALLERGIC RHINITIS   . ANEMIA, PERNICIOUS   . ANXIETY   . DYSLIPIDEMIA   . HYPERTENSION   . CARPAL TUNNEL SYNDROME, LEFT   . OSTEOPENIA   . Selective IgA immunodeficiency   . Irritable bowel syndrome   . BILIARY DYSKINESIA     Past Surgical History  Procedure Date  . Cholecystectomy 2005    Dr. Derrell Lolling (Gallbladder dyskinesia)  . Cesarean section 76 & 85    x's 2   . Tonsillectomy   . Dilation and curettage of uterus   . Carpal tunnel release   . Rotator cuff  repair     Family History  Problem Relation Age of Onset  . Colitis Mother   . Heart disease Mother   . Hyperlipidemia Mother   . Lung cancer Mother   . Hypertension Mother   . Lung cancer Father   . Leukemia Brother   . Ovarian cancer Maternal Aunt   . Diabetes Maternal Aunt     History  Substance Use Topics  . Smoking status: Never Smoker   . Smokeless tobacco: Not on file   Comment: Married, lives with spouse  . Alcohol Use: Yes     occassional    OB History    Grav Para Term Preterm Abortions TAB SAB Ect Mult Living                  Review of Systems  Constitutional: Negative for diaphoresis.  Respiratory: Negative for cough and shortness of breath.   Cardiovascular: Positive for chest pain. Negative for leg swelling.  Gastrointestinal: Negative for nausea and vomiting.  Musculoskeletal: Negative for back pain.  Skin: Negative for color change.  Neurological: Positive for headaches. Negative for dizziness and light-headedness.  Psychiatric/Behavioral: Negative for confusion.    Allergies  Hydrocodone-acetaminophen and Oxycodone-acetaminophen  Home Medications   Current Outpatient Rx  Name Route Sig Dispense Refill  . AMLODIPINE BESYLATE 5 MG PO TABS Oral Take 1 tablet (5 mg total) by mouth daily. 30 tablet 11  . ESTROGENS CONJUGATED 0.625 MG PO TABS Oral Take 0.625  mg by mouth daily.    [name] HYDROCHLOROTHIAZIDE 25 MG PO TABS Oral Take 1 tablet (25 mg total) by mouth daily. 30 tablet 11  . LOSARTAN POTASSIUM 100 MG PO TABS Oral Take 1 tablet (100 mg total) by mouth daily. 30 tablet 5  . METAXALONE 800 MG PO TABS Oral Take 1 tablet (800 mg total) by mouth every 8 (eight) hours as needed for pain. 60 tablet 1  . ADULT MULTIVITAMIN W/MINERALS CH Oral Take 1 tablet by mouth at bedtime.    [name] NITROFURANTOIN MONOHYD MACRO 100 MG PO CAPS Oral Take 100 mg by mouth 2 (two) times daily.    [name] PRAVASTATIN SODIUM 10 MG PO TABS Oral Take 10 mg by mouth at bedtime.    .  VENLAFAXINE HCL ER 75 MG PO CP24 Oral Take 75 mg by mouth at bedtime.      BP 136/63  Pulse 81  Temp 98.3 F (36.8 C) (Oral)  Resp 22  SpO2 100%  Physical Exam  Constitutional: She is oriented to person, place, and time. She appears well-developed and well-nourished. No distress.  HENT:  Head: Normocephalic and atraumatic.  Mouth/Throat: Oropharynx is clear and moist.  Eyes: Conjunctivae normal are normal.  Neck: Normal range of motion. Neck supple. No JVD present.  Cardiovascular: Normal rate, regular rhythm, normal heart sounds and intact distal pulses.        No extremity edema  Pulmonary/Chest: Effort normal and breath sounds normal. She exhibits no tenderness.  Abdominal: Soft. Bowel sounds are normal. There is no tenderness.  Musculoskeletal: Normal range of motion.  Neurological: She is alert and oriented to person, place, and time.  Skin: Skin is warm and dry. She is not diaphoretic.  Psychiatric: She has a normal mood and affect. Her behavior is normal.    ED Course  Procedures (including critical care time)   Labs Reviewed  CBC WITH DIFFERENTIAL  BASIC METABOLIC PANEL   Dg Chest 2 View  05/18/2012  *RADIOLOGY REPORT*  Clinical Data: Chest pain.  Shortness of breath.  CHEST - 2 VIEW  Comparison: None.  Findings: Cardiomediastinal silhouette unremarkable.   Lungs clear. Bronchovascular markings normal.  Pulmonary vascularity normal.  No pneumothorax.  No pleural effusions.  Degenerative changes involving the thoracic spine and slight thoracolumbar scoliosis convex right.  IMPRESSION: No acute cardiopulmonary disease.   Original Report Authenticated By: Arnell Sieving, M.D.    Date: 05/18/2012  Rate: 79  Rhythm: normal sinus rhythm  QRS Axis: normal  Intervals: normal  ST/T Wave abnormalities: normal  Conduction Disutrbances:none  Narrative Interpretation: ventricular premature complex  Old EKG Reviewed: unchanged     No diagnosis found.    MDM  61  y/o female with chest pain as of last night relieved by nitro. Patient developed chest pain again while in the ED. Nitro relieved the symptoms again. Labs without any acute process explaining the pain. Case discussed with Dr. Weldon Inches who also evaluated patient. We both feel admit for further cardiac workup is the best plan of care. She is being admitted to [facility].       Trevor Mace, PA-C 05/18/12 1502

## 2012-05-18 NOTE — H&P (Signed)
PCP:   Rene Paci, MD   Chief Complaint:  Chest pain.   HPI: This is a 61 year old female, with known history of 61 yo with history of HTN, dyslipidemia, Anxiety, headaches, IBS, Selective IGA immunodeficiency, anxiety, pernicious anemia, osteopenia, biliary dyskinesia s/p Cholecystectomy, left carpal tunnel syndrome s/p carpal tunnel release, presenting with chest pain, which started at approx 7:30 PM on 05/17/12. According to patient, this occurred after dinner, at home, while she was just walking around. There was no radiation, dyspnea, nausea or diaphoresis. She subsequently went to bed, and chest discomfort persisted all night. As a matter of fact, she woke up in AM of 05/18/12, with the same chest tightness, went to work, and while there, because of continuing symptoms, had her BP checked, at which time it was found to be 168 systolic. She called her PMD's office, and the RN on duty, referred her to the ED. She took an Aspirin and EMS was called. BP was 160/101. En-route to the ED, chest discomfort was relieved som what  with 4 SL nitroglycerin, but re-occurred in the ED, then gradually faded away, over time. According to patient, she has had occasional "twinges"  of chest discomfort in the past. Last episode was 2 weeks ago.   Allergies:   Allergies  Allergen Reactions  . Hydrocodone-Acetaminophen     REACTION: itching  . Oxycodone-Acetaminophen     REACTION: itching      Past Medical History  Diagnosis Date  . ALLERGIC RHINITIS   . ANEMIA, PERNICIOUS   . ANXIETY   . DYSLIPIDEMIA   . HYPERTENSION   . CARPAL TUNNEL SYNDROME, LEFT   . OSTEOPENIA   . Selective IgA immunodeficiency   . Irritable bowel syndrome   . BILIARY DYSKINESIA   . Headache     Past Surgical History  Procedure Date  . Cholecystectomy 2005    Dr. Derrell Lolling (Gallbladder dyskinesia)  . Cesarean section 76 & 85    x's 2   . Tonsillectomy   . Dilation and curettage of uterus   . Carpal tunnel release     . Rotator cuff repair   . Appendectomy     Prior to Admission medications   Medication Sig Start Date End Date Taking? Authorizing Provider  amLODipine (NORVASC) 5 MG tablet Take 1 tablet (5 mg total) by mouth daily. 04/22/12 04/22/13 Yes Newt Lukes, MD  estrogens, conjugated, (PREMARIN) 0.625 MG tablet Take 0.625 mg by mouth daily.   Yes Historical Provider, MD  hydrochlorothiazide (HYDRODIURIL) 25 MG tablet Take 1 tablet (25 mg total) by mouth daily. 07/29/11 07/28/12 Yes Newt Lukes, MD  losartan (COZAAR) 100 MG tablet Take 1 tablet (100 mg total) by mouth daily. 01/22/12  Yes Newt Lukes, MD  metaxalone (SKELAXIN) 800 MG tablet Take 1 tablet (800 mg total) by mouth every 8 (eight) hours as needed for pain. 01/22/12  Yes Newt Lukes, MD  Multiple Vitamin (MULTIVITAMIN WITH MINERALS) TABS Take 1 tablet by mouth at bedtime.   Yes Historical Provider, MD  nitrofurantoin, macrocrystal-monohydrate, (MACROBID) 100 MG capsule Take 100 mg by mouth 2 (two) times daily.   Yes Historical Provider, MD  pravastatin (PRAVACHOL) 10 MG tablet Take 10 mg by mouth at bedtime.   Yes Historical Provider, MD  venlafaxine XR (EFFEXOR-XR) 75 MG 24 hr capsule Take 75 mg by mouth at bedtime.   Yes Historical Provider, MD    Social History:  reports that she has never smoked. She does  not have any smokeless tobacco history on file. She reports that she drinks about .6 ounces of alcohol per week. She reports that she does not use illicit drugs.  Family History  Problem Relation Age of Onset  . Colitis Mother   . Heart disease Mother   . Hyperlipidemia Mother   . Lung cancer Mother   . Hypertension Mother   . Lung cancer Father   . Leukemia Brother   . Heart disease Brother   . Ovarian cancer Maternal Aunt   . Diabetes Maternal Aunt    According to patient, 2 of her her brothers had heart attacks in their 1s.   Review of Systems:  As per HPI and chief complaint. Patent denies  fatigue, diminished appetite, weight loss, fever, chills, she does have occasional headaches, and had to take an Advil for headache, while in the ED. She denies blurred vision, difficulty in speaking, dysphagia, cough, shortness of breath, orthopnea, paroxysmal nocturnal dyspnea, nausea, diaphoresis, abdominal pain, vomiting, diarrhea,. She used to be troubled by constipation, but this has responded to Miralax. Denies belching, heartburn, hematemesis, melena, dysuria, nocturia, urinary frequency, hematochezia, lower extremity swelling, pain, or redness. The rest of the systems review is negative.  Physical Exam:  General:  Patient does not appear to be in obvious acute distress. Alert, communicative, fully oriented, talking in complete sentences, not short of breath at rest.  HEENT:  No clinical pallor, no jaundice, no conjunctival injection or discharge. Hydration status is fair.  NECK:  Supple, JVP not seen, no carotid bruits, no palpable lymphadenopathy, no palpable goiter. CHEST:  Clinically clear to auscultation, no wheezes, no crackles. HEART:  Sounds 1 and 2 heard, normal, regular, no murmurs. ABDOMEN:  Full, soft, non-tender, no palpable organomegaly, no palpable masses, normal bowel sounds. GENITALIA:  Not examined. LOWER EXTREMITIES:  No pitting edema, palpable peripheral pulses. MUSCULOSKELETAL SYSTEM:  Unremarkable. CENTRAL NERVOUS SYSTEM:  No focal neurologic deficit on gross examination.  Labs on Admission:  Results for orders placed during the hospital encounter of 05/18/12 (from the past 48 hour(s))  CBC WITH DIFFERENTIAL     Status: Abnormal   Collection Time   05/18/12  1:31 PM      Component Value Range Comment   WBC 6.0  4.0 - 10.5 K/uL    RBC 4.01  3.87 - 5.11 MIL/uL    Hemoglobin 12.5  12.0 - 15.0 g/dL    HCT 16.1 (*) 09.6 - 46.0 %    MCV 87.3  78.0 - 100.0 fL    MCH 31.2  26.0 - 34.0 pg    MCHC 35.7  30.0 - 36.0 g/dL    RDW 04.5  40.9 - 81.1 %    Platelets 189  150  - 400 K/uL    Neutrophils Relative 72  43 - 77 %    Neutro Abs 4.3  1.7 - 7.7 K/uL    Lymphocytes Relative 20  12 - 46 %    Lymphs Abs 1.2  0.7 - 4.0 K/uL    Monocytes Relative 8  3 - 12 %    Monocytes Absolute 0.5  0.1 - 1.0 K/uL    Eosinophils Relative 1  0 - 5 %    Eosinophils Absolute 0.1  0.0 - 0.7 K/uL    Basophils Relative 0  0 - 1 %    Basophils Absolute 0.0  0.0 - 0.1 K/uL   BASIC METABOLIC PANEL     Status: Abnormal   Collection Time  05/18/12  1:31 PM      Component Value Range Comment   Sodium 132 (*) 135 - 145 mEq/L    Potassium 4.4  3.5 - 5.1 mEq/L    Chloride 96  96 - 112 mEq/L    CO2 28  19 - 32 mEq/L    Glucose, Bld 94  70 - 99 mg/dL    BUN 8  6 - 23 mg/dL    Creatinine, Ser 1.61  0.50 - 1.10 mg/dL    Calcium 9.7  8.4 - 09.6 mg/dL    GFR calc non Af Amer >90  >90 mL/min    GFR calc Af Amer >90  >90 mL/min   POCT I-STAT TROPONIN I     Status: Normal   Collection Time   05/18/12  1:44 PM      Component Value Range Comment   Troponin i, poc 0.01  0.00 - 0.08 ng/mL    Comment 3              Radiological Exams on Admission: No results found.  Assessment/Plan Active Problems:  1. Chest pain: Patient presented with chest tightness,which apparently has been recurrent, certainly within the last 2 weeks. Although there are atypical features, she does have CAD risk factors of HTN, dyslipidemia, family history of early on-set CAD, and age. 12-Lead EKG is devoid of acute ischemic changes, and cardiac enzymes are negative so far. Patient is pain-free at this time, so we shall admit for observation, cycle cardiac enzymes and monitor telemetrically. Stratification is indicated, and we have consulted  cardiology. 2. HTN.  BP was reportedly elevated at 160/101, at the time of arrival of EMS. As this time, patient is normotensive. We shall therefore, continue preradmission hypertensives, and monitor.  3. Dyslipidemia: Patient was on Statin, which we shall continue. We  shall also, check lipid profile and TSH. 4. GERD: This may indeed be contributory to symptoms, if chest pain turns out to be non-cardiac.Will place patient on PPI for now.   5. Anxiety. This appears stable.     Further management will depend on clinical course.    Time Spent on Admission: 45 mins.   Latesha Chesney,CHRISTOPHER 05/18/2012, 6:37 PM

## 2012-05-18 NOTE — ED Provider Notes (Signed)
Medical screening examination/treatment/procedure(s) were conducted as a shared visit with non-physician practitioner(s) and myself.  I personally evaluated the patient during the encounter 61 year old, nonsmoker, presents emergency department complaining of chest pressure, with shortness of breath.  She had a little bit of nausea, but no emesis.  She has been having this recurrently and address to with her PCP, who performed an EKG, but was not concerned about her symptoms.  Today.  She had recurrence of her symptoms.  One of her friends encouraged her to call EMS.  They did so EMS gave her nitroglycerin, and aspirin, and her symptoms subsided.  Her physical examination is unremarkable.  Her EKG, and percent, cardiac enzymes do not show any signs of a heart attack.  We will admit her to the hospital for further evaluation of unstable angina  Cheri Guppy, MD 05/18/12 [phone]

## 2012-05-18 NOTE — Telephone Encounter (Signed)
Noted symptoms and subsequent advice, we'll followup on ER evaluation -no other recommendations

## 2012-05-18 NOTE — Telephone Encounter (Signed)
Patient calling, has heaviness in her chest this am.  Her B/P is 162/91.  MD had instructed her to call if her diastolic > 80.   Started a new b/p medication a month ago.  Denies any shortness of breath but there is a difference today with her breathing.   911 advised and she voiced an understanding.

## 2012-05-18 NOTE — ED Notes (Signed)
Patient transported to X-ray 

## 2012-05-19 ENCOUNTER — Encounter (HOSPITAL_COMMUNITY): Payer: Self-pay | Admitting: Physician Assistant

## 2012-05-19 ENCOUNTER — Encounter (HOSPITAL_COMMUNITY)
Admission: EM | Disposition: A | Discharge status: Home / Self Care | Payer: Self-pay | Source: Home / Self Care | Attending: Internal Medicine

## 2012-05-19 DIAGNOSIS — R079 Chest pain, unspecified: Secondary | ICD-10-CM

## 2012-05-19 DIAGNOSIS — I200 Unstable angina: Secondary | ICD-10-CM

## 2012-05-19 HISTORY — PX: LEFT HEART CATHETERIZATION WITH CORONARY ANGIOGRAM: SHX5451

## 2012-05-19 LAB — COMPREHENSIVE METABOLIC PANEL
ALT: 19 U/L (ref 0–35)
Albumin: 4.1 g/dL (ref 3.5–5.2)
Alkaline Phosphatase: 38 U/L — ABNORMAL LOW (ref 39–117)
Glucose, Bld: 100 mg/dL — ABNORMAL HIGH (ref 70–99)
Potassium: 4 mEq/L (ref 3.5–5.1)
Sodium: 135 mEq/L (ref 135–145)
Total Protein: 6.3 g/dL (ref 6.0–8.3)

## 2012-05-19 LAB — LIPID PANEL
LDL Cholesterol: 85 mg/dL (ref 0–99)
VLDL: 20 mg/dL (ref 0–40)

## 2012-05-19 LAB — CBC
HCT: 36.9 % (ref 36.0–46.0)
MCH: 31 pg (ref 26.0–34.0)
MCHC: 35.2 g/dL (ref 30.0–36.0)
MCV: 87.9 fL (ref 78.0–100.0)
RDW: 12.2 % (ref 11.5–15.5)

## 2012-05-19 LAB — PROTIME-INR
INR: 1.09 (ref 0.00–1.49)
Prothrombin Time: 14.3 seconds (ref 11.6–15.2)

## 2012-05-19 LAB — TROPONIN I: Troponin I: <0.3 ng/mL (ref <0.30)

## 2012-05-19 LAB — D-DIMER, QUANTITATIVE: D-Dimer, Quant: 0.27 ug/mL-FEU (ref 0.00–0.48)

## 2012-05-19 SURGERY — LEFT HEART CATHETERIZATION WITH CORONARY ANGIOGRAM
Anesthesia: LOCAL

## 2012-05-19 MED ORDER — SODIUM CHLORIDE 0.9 % IV SOLN
INTRAVENOUS | Status: AC
  Administered 2012-05-19: 19:00:00 via INTRAVENOUS

## 2012-05-19 MED ORDER — FENTANYL CITRATE 0.05 MG/ML IJ SOLN
INTRAMUSCULAR | Status: AC
  Filled 2012-05-19: qty 2

## 2012-05-19 MED ORDER — ASPIRIN 81 MG PO CHEW
324.0000 mg | CHEWABLE_TABLET | ORAL | Status: AC
  Administered 2012-05-19: 324 mg via ORAL
  Filled 2012-05-19: qty 4

## 2012-05-19 MED ORDER — LIDOCAINE HCL (PF) 1 % IJ SOLN
INTRAMUSCULAR | Status: AC
  Filled 2012-05-19: qty 30

## 2012-05-19 MED ORDER — HEPARIN (PORCINE) IN NACL 2-0.9 UNIT/ML-% IJ SOLN
INTRAMUSCULAR | Status: AC
  Filled 2012-05-19: qty 1000

## 2012-05-19 MED ORDER — MIDAZOLAM HCL 2 MG/2ML IJ SOLN
INTRAMUSCULAR | Status: AC
  Filled 2012-05-19: qty 2

## 2012-05-19 MED ORDER — SODIUM CHLORIDE 0.9 % IV SOLN
1.0000 mL/kg/h | INTRAVENOUS | Status: DC
  Administered 2012-05-19: 1 mL/kg/h via INTRAVENOUS

## 2012-05-19 MED ORDER — NITROGLYCERIN 0.2 MG/ML ON CALL CATH LAB
INTRAVENOUS | Status: AC
  Filled 2012-05-19: qty 1

## 2012-05-19 MED ORDER — PANTOPRAZOLE SODIUM 40 MG PO TBEC: 40.0000 mg | DELAYED_RELEASE_TABLET | Freq: Every day | ORAL | Status: DC

## 2012-05-19 NOTE — Progress Notes (Signed)
Unsuccessful attempt x2 to start 2nd IV, pt for cardiac cath this pm. Iv team made aware

## 2012-05-19 NOTE — H&P (Signed)
  See note of Dr. Shirlee Latch.  He has explained the risks and benefits of the procedure to the patient during the consultation and recommended cardiac cath which he set up.  I have also discussed with the patient and discussed the reasons for the procedure.  She is agreeable to proceed.

## 2012-05-19 NOTE — CV Procedure (Signed)
   Cardiac Catheterization Procedure Note  Name: KIMBERLYE DILGER MRN: [id number] DOB: 02-08-1951  Procedure: Left Heart Cath, Selective Coronary Angiography, LV angiography  Indication:  Chest pain, normal enzymes   Procedural details: The right groin was prepped, draped, and anesthetized with 1% lidocaine. Using modified Seldinger technique, a 4 French sheath was introduced into the right femoral artery. Standard Judkins catheters were used for coronary angiography and left ventriculography. Catheter exchanges were performed over a guidewire. There were no immediate procedural complications. The patient was transferred to the post catheterization recovery area for further monitoring.  Procedural Findings: Hemodynamics:  AO 135/75 (100) LV 131/6 No gradient   Coronary angiography: Coronary dominance: right  Left mainstem: The overall caliber of the vessels are slightly small  (4'11").  The LMCA is free of significant disease.    Left anterior descending (LAD): Courses to the apex and provides the apical tip.  There may be minor irregularity at the ostium, but there is no major obstruction noted.  There is a large septal and diagonal, and the distal vessel tapers.  No significant obstruction is noted  Left circumflex (LCx): Consists of a tiny OM 1, and large OM2.  No sig obstruction.   Right coronary artery (RCA): Larger in caliber providing both a PDA and PLA system of moderate size.  No obstruction.   Left ventriculography: Left ventricular systolic function is normal, LVEF is estimated at 55-65%, there is no significant mitral regurgitation   Final Conclusions:   1.  No significant CAD 2.  Preserved LV function  Recommendations:  1.  Given her estrogen use of one year, despite lack of any findings of DVT, we will get a d-dimer given the severity of the disease.  I spoke with Dr. Shirlee Latch who felt she could be dc later tonight from our standpoint if she is stable.  Groin  instructions given.  Ms. Raford Pitcher (on call) to check groin before dc. I reviewed the films with her husband.  Attempting to call [facility] hospitalist in charge of her care.    Shawnie Pons, MD, [facility], FSCAI 05/19/2012, 6:19 PM

## 2012-05-19 NOTE — Progress Notes (Signed)
Spoke with pt/husband. She is pain-free, no problems with cath site. Dressing still in place, no hematoma/bruit. No bleeding note on dressing. They wish for d/c this pm if possible. They live close by and prefer to go home. Will communicate with RN and attending.

## 2012-05-19 NOTE — Discharge Summary (Signed)
Physician Discharge Summary  [name]:[id number] DOB: 02-Nov-1950 DOA: 05/18/2012  PCP: Rene Paci, MD  Admit date: 05/18/2012 Discharge date: 05/19/2012  Recommendations for Outpatient Follow-up:  1. Please follow up with your primary care provider within 1 week.  Continue protonix as outpatient for possible acid reflux.  Discharge Diagnoses:  Active Problems:  DYSLIPIDEMIA  ANXIETY  HYPERTENSION  Chest pain  HTN (hypertension)  Dyslipidemia   Discharge Condition: stable, improved  Diet recommendation:  2gm sodium, healthy heart  Wt Readings from Last 3 Encounters:  05/18/12 55.611 kg (122 lb 9.6 oz)  05/18/12 55.611 kg (122 lb 9.6 oz)  04/22/12 55.792 kg (123 lb)    History of present illness:   This is a 61 year old female, with known history of 61 yo with history of HTN, dyslipidemia, Anxiety, headaches, IBS, Selective IGA immunodeficiency, anxiety, pernicious anemia, osteopenia, biliary dyskinesia s/p Cholecystectomy, left carpal tunnel syndrome s/p carpal tunnel release, presenting with chest pain, which started at approx 7:30 PM on 05/17/12. According to patient, this occurred after dinner, at home, while she was just walking around. There was no radiation, dyspnea, nausea or diaphoresis. She subsequently went to bed, and chest discomfort persisted all night. As a matter of fact, she woke up in AM of 05/18/12, with the same chest tightness, went to work, and while there, because of continuing symptoms, had her BP checked, at which time it was found to be 168 systolic. She called her PMD's office, and the RN on duty, referred her to the ED. She took an Aspirin and EMS was called. BP was 160/101. En-route to the ED, chest discomfort was relieved som what with 4 SL nitroglycerin, but re-occurred in the ED, then gradually faded away, over time. According to patient, she has had occasional "twinges" of chest discomfort in the past. Last episode was 2 weeks ago.   Hospital  Course:   1. Chest pain: Patient presented with chest tightness,which has occurred rarely for several months to years and which has become more severe and frequent during the last 2 weeks.  The pain is not exertional.  Because of her risk factors for CAD and her family history of early onset CAD, she was deemed higher risk for CAD.  Her troponins were less than threshold x 3 and her EKG showed NSR.  She underwent coronary catheterization by Dr. Riley Kill on 9/12 which showed no significant coronary disease.  Because her catheterization did not reveal the etiology of her pain, a d-dimer was sent for this low risk patient to rule out PE, which was negative.  2. HTN. BP was reportedly elevated at 160/101, at the time of arrival of EMS. However, her blood pressure normalized shortly thereafter and elevation was likely secondary to stress and pain.  She continued norvasc, HCTZ, and losartan.  3. Dyslipidemia:  LDL 85 and HDL 105.  Patient was continued on Statin.  4. GERD:  May be contributing to patient's nonexertional symptoms.  Continue trial of PPI.   5. Anxiety. This appears stable.  Continued venlafaxine  6.  UTI:  Started on macrobid on 9/10.  Continue per primary care doctors instructions.   Procedures:  9/12  Coronary catheterization  Consultations:  Cardiology  Discharge Exam: Filed Vitals:   05/19/12 1652  BP:   Pulse: 72  Temp:   Resp:    Filed Vitals:   05/19/12 1112 05/19/12 1335 05/19/12 1638 05/19/12 1652  BP: 128/77 107/58 126/72   Pulse:  73  72  Temp:  97.8 F (36.6 C) 98.2 F (36.8 C)   TempSrc:  Oral    Resp:  18 18   Height:      Weight:      SpO2:  100% 99%     General:   Thin caucasian female, no acute distress  HEENT: MMM  NECK: Supple, no JVP not seen CHEST: CTAB.  HEART: RRR, no murmurs, rubs or gallops ABDOMEN:  NABS, soft, nondistended, nontender, no organomegaly EXT:  No LEE Psych:  A&Ox4  Discharge Instructions      Discharge Orders      Future Appointments: Provider: Department: Dept Phone: Center:   07/29/2012 8:00 AM Newt Lukes, MD [facility] [phone] [id number]     Future Orders Please Complete By Expires   Diet - low sodium heart healthy      Increase activity slowly      Discharge instructions      Comments:   You were hospitalized with chest pressure that was concerning for angina, or pain related to coronary artery disease.  Your blood tests were negative for heart attack and your EKG did not demonstrate heart attack.  Because of your risk factors for heart attack and your family history you underwent coronary catheterization and you did not have coronary artery disease.  You were also tested for pulmonary embolism, a blood clot in the lungs, with a blood test and it was also negative.  Please take protonix until you follow up with your primary care doctor within one week.   Call MD for:      Comments:   Call 911 if you experience chest pressure with shortness of breath or symptoms of stroke, including facial droop, slurred speech, weakness of an arm or leg, or confusion.   Call MD for:  temperature >100.4      Call MD for:  persistant nausea and vomiting      Call MD for:  severe uncontrolled pain      Call MD for:  hives      Call MD for:  difficulty breathing, headache or visual disturbances      Call MD for:  persistant dizziness or light-headedness      Call MD for:  extreme fatigue          Medication List     As of 05/19/2012  8:14 PM    TAKE these medications         amLODipine 5 MG tablet   Commonly known as: NORVASC   Take 1 tablet (5 mg total) by mouth daily.      estrogens (conjugated) 0.625 MG tablet   Commonly known as: PREMARIN   Take 0.625 mg by mouth daily.      hydrochlorothiazide 25 MG tablet   Commonly known as: HYDRODIURIL   Take 1 tablet (25 mg total) by mouth daily.      losartan 100 MG tablet   Commonly known as: COZAAR   Take 1 tablet (100 mg total) by mouth daily.       metaxalone 800 MG tablet   Commonly known as: SKELAXIN   Take 1 tablet (800 mg total) by mouth every 8 (eight) hours as needed for pain.      multivitamin with minerals Tabs   Take 1 tablet by mouth at bedtime.      nitrofurantoin (macrocrystal-monohydrate) 100 MG capsule   Commonly known as: MACROBID   Take 100 mg by mouth 2 (two) times daily.  pantoprazole 40 MG tablet   Commonly known as: PROTONIX   Take 1 tablet (40 mg total) by mouth daily at 6 (six) AM.      pravastatin 10 MG tablet   Commonly known as: PRAVACHOL   Take 10 mg by mouth at bedtime.      venlafaxine XR 75 MG 24 hr capsule   Commonly known as: EFFEXOR-XR   Take 75 mg by mouth at bedtime.        Follow-up Information    Follow up with Rene Paci, MD. Schedule an appointment as soon as possible for a visit in 1 week.   Contact information:   [address] [place] [address] [phone]           The results of significant diagnostics from this hospitalization (including imaging, microbiology, ancillary and laboratory) are listed below for reference.    Significant Diagnostic Studies: Dg Chest 2 View  05/18/2012  *RADIOLOGY REPORT*  Clinical Data: Chest pain.  Shortness of breath.  CHEST - 2 VIEW  Comparison: None.  Findings: Cardiomediastinal silhouette unremarkable.   Lungs clear. Bronchovascular markings normal.  Pulmonary vascularity normal.  No pneumothorax.  No pleural effusions.  Degenerative changes involving the thoracic spine and slight thoracolumbar scoliosis convex right.  IMPRESSION: No acute cardiopulmonary disease.   Original Report Authenticated By: Arnell Sieving, M.D.     Microbiology: No results found for this or any previous visit (from the past 240 hour(s)).   Labs: Basic Metabolic Panel:  Lab 05/19/12 9147 05/18/12 2056 05/18/12 1331  NA 135 -- 132*  K 4.0 -- 4.4  CL 99 -- 96  CO2 28 -- 28  GLUCOSE 100* -- 94  BUN 9 --  8  CREATININE 0.74 0.69 0.58  CALCIUM 9.5 -- 9.7  MG -- -- --  PHOS -- -- --   Liver Function Tests:  Lab 05/19/12 0248  AST 20  ALT 19  ALKPHOS 38*  BILITOT 0.3  PROT 6.3  ALBUMIN 4.1   No results found for this basename: LIPASE:5,AMYLASE:5 in the last 168 hours No results found for this basename: AMMONIA:5 in the last 168 hours CBC:  Lab 05/19/12 0248 05/18/12 2056 05/18/12 1331  WBC 5.2 5.7 6.0  NEUTROABS -- -- 4.3  HGB 13.0 12.3 12.5  HCT 36.9 34.3* 35.0*  MCV 87.9 87.7 87.3  PLT 193 194 189   Cardiac Enzymes:  Lab 05/19/12 0839 05/19/12 0247 05/18/12 2056  CKTOTAL -- -- --  CKMB -- -- --  CKMBINDEX -- -- --  TROPONINI <0.30 <0.30 <0.30   BNP: BNP (last 3 results) No results found for this basename: PROBNP:3 in the last 8760 hours CBG: No results found for this basename: GLUCAP:5 in the last 168 hours  Time coordinating discharge: 45 minutes  Signed:  Ahlam Piscitelli  [facility] 05/19/2012, 8:14 PM

## 2012-05-19 NOTE — Consult Note (Signed)
CARDIOLOGY CONSULT NOTE   Patient ID: [name] MRN: [id number] DOB/AGE: 59-13-1952 59 y.o.  Admit date: 05/18/2012  Primary Physician   Rene Paci, MD Primary Cardiologist   New to cardiology Reason for Consultation  Chest pain  [id number] [name] is a 61 y.o. female withwith HTN and dyslipidemia, FH of CAD but no personal h/o CAD.  Over the past year, she has had intermittent exertional chest pressure, sometimes associated with lightheadedness and mild dysnpea, radiating to one or both arms, lasting anywhere from a few mins up to 1 hr, and resolving with rest.  She saw her PCP about these Ss at one point and had an ECG done, which was normal.  She otherwise has not been evaluated from a cardiac standpoint.  On Tues evening, she developed recurrent 9/10 chest pressure w/o associated Ss.  She took an advil and was able to fall off to sleep after a few hours.  Yesterday AM, Ss recurred and progressed while she was at work.  A co-worker checked her BP and noted it to be 165/102.  EMS was called and upon arrival, she was given ntg and asa with relief of discomfort.  She was taken to [facility] and has since r/o for MI.  She has had no further chest pain.  We've been asked to eval.   Past Medical History  Diagnosis Date  . ALLERGIC RHINITIS   . ANEMIA, PERNICIOUS   . ANXIETY   . DYSLIPIDEMIA   . HYPERTENSION   . CARPAL TUNNEL SYNDROME, LEFT   . OSTEOPENIA   . Selective IgA immunodeficiency   . Irritable bowel syndrome   . BILIARY DYSKINESIA   . Headache     Past Surgical History  Procedure Date  . Cholecystectomy 2005    Dr. Derrell Lolling (Gallbladder dyskinesia)  . Cesarean section 74 & 83    x's 2   . Tonsillectomy   . Dilation and curettage of uterus   . Carpal tunnel release   . Rotator cuff repair   . Appendectomy     Allergies  Allergen Reactions  . Hydrocodone-Acetaminophen     REACTION: itching  . Oxycodone-Acetaminophen     REACTION: itching    I have reviewed  the patient's current medications    . amLODipine  5 mg Oral Daily  . aspirin EC  325 mg Oral Daily  . estrogens (conjugated)  0.625 mg Oral Daily  . heparin  5,000 Units Subcutaneous Q8H  . hydrochlorothiazide  25 mg Oral Daily  . ibuprofen  800 mg Oral Once  . losartan  100 mg Oral Daily  . multivitamin with minerals  1 tablet Oral QHS  . nitrofurantoin (macrocrystal-monohydrate)  100 mg Oral BID  . pantoprazole  40 mg Oral Q0600  . simvastatin  5 mg Oral q1800  . sodium chloride  3 mL Intravenous Q12H  . sodium chloride  3 mL Intravenous Q12H  . sodium chloride  3 mL Intravenous Q12H  . venlafaxine XR  75 mg Oral QHS     sodium chloride, sodium chloride, acetaminophen, acetaminophen, metaxalone, nitroGLYCERIN, ondansetron (ZOFRAN) IV, ondansetron, sodium chloride, sodium chloride   History   Social History  . Marital Status: Married    Spouse Name: N/A    Number of Children: N/A  . Years of Education: N/A   Occupational History  . Dental office    Social History Main Topics  . Smoking status: Never Smoker   . Smokeless tobacco: Not on file  Comment: Married, lives with spouse  . Alcohol Use: 0.6 oz/week    1 Glasses of wine per week     occassional  . Drug Use: No  . Sexually Active: Yes    Birth Control/ Protection: Post-menopausal   Other Topics Concern  . Not on file   Social History Narrative   Married and lives with husband.      Family History  Problem Relation Age of Onset  . Colitis Mother   . Heart disease Mother   . Hyperlipidemia Mother   . Lung cancer Mother     Cause of death  . Hypertension Mother   . Lung cancer Father   . Leukemia Brother   . Coronary artery disease Brother     MI in late 65's. Still living  . Ovarian cancer Maternal Aunt   . Diabetes Maternal Aunt   . Coronary artery disease Brother     MI in 50's. Also still living.     ROS:  Full 14 point review of systems complete and found to be negative unless listed  above.  Physical Exam: Blood pressure 127/81, pulse 71, temperature 97.6 F (36.4 C), temperature source Oral, resp. rate 19, height 5' (1.524 m), weight 122 lb 9.6 oz (55.611 kg), SpO2 100.00%.  General: Well developed, well nourished, female in no acute distress Head: Eyes PERRLA, No xanthomas.   Normocephalic and atraumatic, oropharynx without edema or exudate. Dentition - good Lungs: clear bilaterally. Good expansion. Heart: HRRR S1 S2, no rub/gallop. pulses are 2+ extrem. Neck: No carotid bruits. No lymphadenopathy. No JVD. Abdomen: Bowel sounds present, abdomen soft and non-tender without masses or hernias noted. Msk:  No spine or cva tenderness. No weakness, no joint deformities or effusions. Extremities: No clubbing or cyanosis. No  edema.  Neuro: Alert and oriented X 3. No focal deficits noted. Psych:  Good affect, responds appropriately Skin: No rashes or lesions noted.  Labs:   Lab Results  Component Value Date   WBC 5.2 05/19/2012   HGB 13.0 05/19/2012   HCT 36.9 05/19/2012   MCV 87.9 05/19/2012   PLT 193 05/19/2012    Lab 05/19/12 0248  NA 135  K 4.0  CL 99  CO2 28  BUN 9  CREATININE 0.74  CALCIUM 9.5  PROT 6.3  BILITOT 0.3  ALKPHOS 38*  ALT 19  AST 20  GLUCOSE 100*    Basename 05/19/12 0839 05/19/12 0247 05/18/12 2056  CKTOTAL -- -- --  CKMB -- -- --  TROPONINI <0.30 <0.30 <0.30    Basename 05/18/12 1344  TROPIPOC 0.01   Lab Results  Component Value Date   CHOL 210* 05/19/2012   HDL 105 05/19/2012   LDLCALC 85 05/19/2012   TRIG 99 05/19/2012   TSH  Date/Time Value Range Status  05/18/2012  8:56 PM 3.079  0.350 - 4.500 uIU/mL Final   Echo:   ECG:  05/18/2012 11:52AM - NSR with 1 PVC. 79 bpm.  Radiology:  Dg Chest 2 View 05/18/2012  *RADIOLOGY REPORT*  Clinical Data: Chest pain.  Shortness of breath.  CHEST - 2 VIEW  Comparison: None.  Findings: Cardiomediastinal silhouette unremarkable.   Lungs clear. Bronchovascular markings normal.  Pulmonary  vascularity normal.  No pneumothorax.  No pleural effusions.  Degenerative changes involving the thoracic spine and slight thoracolumbar scoliosis convex right.  IMPRESSION: No acute cardiopulmonary disease.   Original Report Authenticated By: Arnell Sieving, M.D.     ASSESSMENT AND PLAN:  1. Unstable angina - patient has significant risk factors for CAD including HTN, dyslipidemia and significant family history. Negative Troponin x2 and negative POC troponin x1. No EKG changes.  Despite prolonged duration with negative CE, Ss are concerning for angina, especially in light of family history.  Discussed options with patient and we will plan to proceed with diagnostic cath this afternoon.  She is agreeable.  Cont asa, statin, bp meds.  2. HTN - Cont home meds.  Consider adjusting norvasc further if BP trending high.  3. Dyslipidemia - Continue statin.  Signed: Theodore Demark 05/19/2012, 10:51 AM Co-Sign MD  Patient seen with PA, agree with the above note.  Patient has HTN, hyperlipidemia, and a significant family history of premature CAD.  She has a history of exertional chest heaviness.  She has had 2 episodes recently of prolonged chest heaviness at rest.  Symptoms were resolved by NTG sublingual.   I agree with the above plan for left heart cath this afternoon.  Will keep her NPO.   Marca Ancona 05/19/2012 12:15 PM

## 2012-05-19 NOTE — Progress Notes (Signed)
PM  Stable.  Anatomy reviewed with patient and husband.  Groin stable.  Instructions given to nursing, husband, and patient about post procedure care.    D-dimer is pending.  She does not have the appearance of PE, but given the nature and severity, and less than one year use of HRT, I thought it worthwhile to check.  If significantly elevated, would be worthwhile to consider as an alternative, although clinical findings argue against.

## 2012-05-26 ENCOUNTER — Encounter: Payer: Self-pay | Admitting: Internal Medicine

## 2012-05-26 ENCOUNTER — Ambulatory Visit (INDEPENDENT_AMBULATORY_CARE_PROVIDER_SITE_OTHER): Payer: BC Managed Care – PPO | Admitting: Internal Medicine

## 2012-05-26 VITALS — BP 140/80 | HR 79 | Temp 97.0°F | Resp 15 | Wt 121.4 lb

## 2012-05-26 DIAGNOSIS — F411 Generalized anxiety disorder: Secondary | ICD-10-CM

## 2012-05-26 DIAGNOSIS — R079 Chest pain, unspecified: Secondary | ICD-10-CM

## 2012-05-26 DIAGNOSIS — I10 Essential (primary) hypertension: Secondary | ICD-10-CM

## 2012-05-26 MED ORDER — ALPRAZOLAM 0.25 MG PO TABS: 0.2500 mg | ORAL_TABLET | Freq: Two times a day (BID) | ORAL | Status: DC | PRN

## 2012-05-26 MED ORDER — AMLODIPINE BESYLATE 10 MG PO TABS: 10.0000 mg | ORAL_TABLET | Freq: Every day | ORAL | Status: DC

## 2012-05-26 MED ORDER — VENLAFAXINE HCL ER 150 MG PO CP24: 150.0000 mg | ORAL_CAPSULE | Freq: Every day | ORAL | Status: DC

## 2012-05-26 MED ORDER — LOSARTAN POTASSIUM-HCTZ 100-25 MG PO TABS: 1.0000 | ORAL_TABLET | Freq: Every day | ORAL | Status: DC

## 2012-05-26 NOTE — Patient Instructions (Signed)
It was good to see you today. We've reviewed your hospitalization including tests and medication -no evidence of heart problem or blood clot Will treat anxiety with increase Effexor dose to 150 mg daily and low-dose Xanax as needed for panic attack symptoms Increase dose amlodipine to 10 mg daily and combine losartan with hydrochlorothiazide therapy into 1 pill Your prescription(s) have been submitted to your pharmacy. Please take as directed and contact our office if you believe you are having problem(s) with the medication(s). Please schedule followup in 2-4 weeks for blood pressure and anxiety recheck, call sooner if problems.

## 2012-05-26 NOTE — Progress Notes (Signed)
  Subjective:    Patient ID: [name], female    DOB: 08/22/1951, 61 y.o.   MRN: [id number]  HPI  Here for hospital follow up -05/2012 48h stay for chest pain -  Reviewed DC summary and tests Negative CAD eval (neg serial enz and no CAD on cath 05/19/12) Neg DDimer -  Also reviewed chronic medical issues: HTN. BP was reportedly elevated at 160/101, at the time of arrival of EMS. However, her blood pressure normalized shortly thereafter and elevation was likely secondary to stress and pain.  She continued norvasc, HCTZ, and losartan.  Dyslipidemia:  on Statin. the patient reports compliance with medication(s) as prescribed. Denies adverse side effects.  Anxiety. on venlafaxine - increasing panic attack symptoms precipitated by stress - considering job change    Past Medical History  Diagnosis Date  . ALLERGIC RHINITIS   . ANEMIA, PERNICIOUS   . ANXIETY   . DYSLIPIDEMIA   . HYPERTENSION   . CARPAL TUNNEL SYNDROME, LEFT   . OSTEOPENIA   . Selective IgA immunodeficiency   . Irritable bowel syndrome   . BILIARY DYSKINESIA   . Headache     Review of Systems  Constitutional: Negative for fever or weight change.  Respiratory: Negative for cough and shortness of breath.   Cardiovascular: Negative for chest pain or palpitations since DC       Objective:   Physical Exam  BP 140/80  Pulse 79  Temp 97 F (36.1 C) (Oral)  Resp 15  Wt 121 lb 6 oz (55.055 kg)  SpO2 97% Wt Readings from Last 3 Encounters:  05/26/12 121 lb 6 oz (55.055 kg)  05/18/12 122 lb 9.6 oz (55.611 kg)  05/18/12 122 lb 9.6 oz (55.611 kg)   Constitutional: She appears well-developed and well-nourished. No distress.  Neck: Normal range of motion. Neck supple. No JVD present. No thyromegaly present.  Cardiovascular: Normal rate, regular rhythm and normal heart sounds.  No murmur heard. No BLE edema. Pulmonary/Chest: Effort normal and breath sounds normal. No respiratory distress. She has no wheezes.    Skin: Skin is warm and dry. No rash noted. No erythema.  Psychiatric: She has a normal mood and affect. Her behavior is normal. Judgment and thought content normal.   Lab Results  Component Value Date   WBC 5.2 05/19/2012   HGB 13.0 05/19/2012   HCT 36.9 05/19/2012   PLT 193 05/19/2012   GLUCOSE 100* 05/19/2012   CHOL 210* 05/19/2012   TRIG 99 05/19/2012   HDL 105 05/19/2012   LDLDIRECT 127.0 07/20/2011   LDLCALC 85 05/19/2012   ALT 19 05/19/2012   AST 20 05/19/2012   NA 135 05/19/2012   K 4.0 05/19/2012   CL 99 05/19/2012   CREATININE 0.74 05/19/2012   BUN 9 05/19/2012   CO2 28 05/19/2012   TSH 3.079 05/18/2012   INR 1.09 05/19/2012   HGBA1C 5.2 07/20/2011        Assessment & Plan:   See problem list. Medications and labs reviewed today.  chest pain -noncardiac - treat for anxiety and possible GERD - see below  Time spent with p today 25 minutes, greater than 50% time spent counseling patient on chest pain, anxiety, hypertension and medication review. Also review of hospital records

## 2012-05-26 NOTE — Assessment & Plan Note (Signed)
Patient now admits she's only been taking half prescribed dose of Effexor Agrees to take this as prescribed now, in effect doubling her current dose Also provide low-dose Xanax to use for panic attack symptoms such as chest tightness Followup in 4-6 weeks, sooner if worse

## 2012-05-26 NOTE — Assessment & Plan Note (Signed)
BP Readings from Last 3 Encounters:  05/26/12 140/80  05/19/12 126/72  05/19/12 126/72   On losartan (maxed dose 01/2012) and HCTZ since 07/2011 Added amlodipine 04/2012 - max dose now Pt to monitor at home/work and call if BP >140/85 Followup in 2-4 weeks, sooner as uncontrolled Also encouraged compliance with anxiety management for optimal blood pressure management

## 2012-06-08 ENCOUNTER — Other Ambulatory Visit: Payer: Self-pay | Admitting: Internal Medicine

## 2012-06-11 ENCOUNTER — Other Ambulatory Visit: Payer: Self-pay | Admitting: Internal Medicine

## 2012-06-17 ENCOUNTER — Encounter: Payer: Self-pay | Admitting: Internal Medicine

## 2012-06-17 ENCOUNTER — Ambulatory Visit (INDEPENDENT_AMBULATORY_CARE_PROVIDER_SITE_OTHER): Payer: BC Managed Care – PPO | Admitting: Internal Medicine

## 2012-06-17 VITALS — BP 128/86 | HR 76 | Temp 97.4°F | Ht 59.5 in | Wt 122.0 lb

## 2012-06-17 DIAGNOSIS — E785 Hyperlipidemia, unspecified: Secondary | ICD-10-CM

## 2012-06-17 DIAGNOSIS — I10 Essential (primary) hypertension: Secondary | ICD-10-CM

## 2012-06-17 DIAGNOSIS — Z23 Encounter for immunization: Secondary | ICD-10-CM

## 2012-06-17 DIAGNOSIS — F411 Generalized anxiety disorder: Secondary | ICD-10-CM

## 2012-06-17 MED ORDER — ALPRAZOLAM 0.25 MG PO TABS: 0.2500 mg | ORAL_TABLET | Freq: Two times a day (BID) | ORAL | Status: DC | PRN

## 2012-06-17 NOTE — Assessment & Plan Note (Signed)
Prev on crestor but cost prohibitive - tried atorva>> myalgia began low dose prava  11/12 as pt pref to be on some statin - Reduced to 10mg  04/2012 Reviewed very good HDL as also protective (on premarin)

## 2012-06-17 NOTE — Assessment & Plan Note (Signed)
BP Readings from Last 3 Encounters:  06/17/12 128/86  05/26/12 140/80  05/19/12 126/72   On losartan (maxed dose 01/2012) and HCTZ since 07/2011 Added amlodipine 04/2012, maxed dose 05/2012 - improved Pt to monitor at home/work and call if BP >140/85 Encouraged continued compliance with anxiety management for optimal blood pressure management

## 2012-06-17 NOTE — Patient Instructions (Signed)
It was good to see you today. We have reviewed your prior records including labs and tests today Medications reviewed, no changes at this time. Refill on medication(s) as discussed today. Flu shot today Please schedule followup in 3-4 months for physical and labs, call sooner if problems.

## 2012-06-17 NOTE — Assessment & Plan Note (Signed)
on Effexor, dose increased 05/2012 Also continue low-dose Xanax to use for panic attack symptoms such as chest tightness 

## 2012-06-17 NOTE — Progress Notes (Signed)
  Subjective:    Patient ID: [name], female    DOB: [birth year], 61 y.o.   MRN: [id number]  HPI Here for follow up - reviewed chronic medical issues:  05/2012 48h stay for chest pain -  Reviewed DC summary and tests Negative CAD eval (neg serial enz and no CAD on cath 05/19/12) Neg DDimer -   HTN. BP elevated 05/2012 during ER eval for CP (160/101), likely secondary to stress and pain. on norvasc, HCTZ, and losartan - takes medications as prescribed denies adverse side effects  Dyslipidemia: on Statin. the patient reports compliance with medication(s) as prescribed. Denies adverse side effects.  Anxiety. on venlafaxine, dose increased September 2013, somewhat improved. intermittent panic attack symptoms precipitated by stress/work - considering job change   Past Medical History  Diagnosis Date  . ALLERGIC RHINITIS   . ANEMIA, PERNICIOUS   . ANXIETY   . DYSLIPIDEMIA   . HYPERTENSION   . CARPAL TUNNEL SYNDROME, LEFT   . OSTEOPENIA   . Selective IgA immunodeficiency   . Irritable bowel syndrome   . BILIARY DYSKINESIA   . Headache     Review of Systems Constitutional: Negative for fever or weight change.  Respiratory: Negative for cough and shortness of breath.   Cardiovascular: Negative for chest pain or palpitations       Objective:   Physical Exam  BP 128/86  Pulse 76  Temp 97.4 F (36.3 C) (Oral)  Ht 4' 11.5" (1.511 m)  Wt 122 lb (55.339 kg)  BMI 24.23 kg/m2  SpO2 98% Wt Readings from Last 3 Encounters:  06/17/12 122 lb (55.339 kg)  05/26/12 121 lb 6 oz (55.055 kg)  05/18/12 122 lb 9.6 oz (55.611 kg)   Constitutional: She appears well-developed and well-nourished. No distress.  Neck: Normal range of motion. Neck supple. No JVD present. No thyromegaly present.  Cardiovascular: Normal rate, regular rhythm and normal heart sounds.  No murmur heard. No BLE edema. Pulmonary/Chest: Effort normal and breath sounds normal. No respiratory distress. She has no  wheezes.  Skin: Skin is warm and dry. No rash noted. No erythema.  Psychiatric: She has a normal mood and affect. Her behavior is normal. Judgment and thought content normal.   Lab Results  Component Value Date   WBC 5.2 05/19/2012   HGB 13.0 05/19/2012   HCT 36.9 05/19/2012   PLT 193 05/19/2012   GLUCOSE 100* 05/19/2012   CHOL 210* 05/19/2012   TRIG 99 05/19/2012   HDL 105 05/19/2012   LDLDIRECT 127.0 07/20/2011   LDLCALC 85 05/19/2012   ALT 19 05/19/2012   AST 20 05/19/2012   NA 135 05/19/2012   K 4.0 05/19/2012   CL 99 05/19/2012   CREATININE 0.74 05/19/2012   BUN 9 05/19/2012   CO2 28 05/19/2012   TSH 3.079 05/18/2012   INR 1.09 05/19/2012   HGBA1C 5.2 07/20/2011        Assessment & Plan:   See problem list. Medications and labs reviewed today.  Time spent with patient today 25 minutes, greater than 50% time spent counseling patient on anxiety, hypertension and medication review. Also review of hospital records

## 2012-07-07 ENCOUNTER — Other Ambulatory Visit: Payer: Self-pay | Admitting: Internal Medicine

## 2012-07-29 ENCOUNTER — Encounter: Payer: BC Managed Care – PPO | Admitting: Internal Medicine

## 2012-08-05 ENCOUNTER — Other Ambulatory Visit: Payer: Self-pay | Admitting: Internal Medicine

## 2012-09-16 ENCOUNTER — Other Ambulatory Visit (INDEPENDENT_AMBULATORY_CARE_PROVIDER_SITE_OTHER): Payer: BC Managed Care – PPO

## 2012-09-16 DIAGNOSIS — Z0000 Encounter for general adult medical examination without abnormal findings: Secondary | ICD-10-CM

## 2012-09-16 LAB — URINALYSIS, ROUTINE W REFLEX MICROSCOPIC
Hgb urine dipstick: NEGATIVE
Ketones, ur: NEGATIVE
Urine Glucose: NEGATIVE
Urobilinogen, UA: 0.2 (ref 0.0–1.0)

## 2012-09-16 LAB — CBC WITH DIFFERENTIAL/PLATELET
Basophils Relative: 0.8 % (ref 0.0–3.0)
Eosinophils Relative: 2.9 % (ref 0.0–5.0)
HCT: 38.6 % (ref 36.0–46.0)
Hemoglobin: 13.2 g/dL (ref 12.0–15.0)
MCV: 91.1 fl (ref 78.0–100.0)
Monocytes Absolute: 0.5 10*3/uL (ref 0.1–1.0)
Neutro Abs: 2.4 10*3/uL (ref 1.4–7.7)
Neutrophils Relative %: 56.8 % (ref 43.0–77.0)
RBC: 4.24 Mil/uL (ref 3.87–5.11)
WBC: 4.2 10*3/uL — ABNORMAL LOW (ref 4.5–10.5)

## 2012-09-16 LAB — BASIC METABOLIC PANEL
CO2: 28 mEq/L (ref 19–32)
Chloride: 96 mEq/L (ref 96–112)
Creatinine, Ser: 0.7 mg/dL (ref 0.4–1.2)
Potassium: 4.9 mEq/L (ref 3.5–5.1)
Sodium: 133 mEq/L — ABNORMAL LOW (ref 135–145)

## 2012-09-16 LAB — HEPATIC FUNCTION PANEL
ALT: 29 U/L (ref 0–35)
Albumin: 4.6 g/dL (ref 3.5–5.2)
Bilirubin, Direct: 0.1 mg/dL (ref 0.0–0.3)
Total Protein: 6.6 g/dL (ref 6.0–8.3)

## 2012-09-16 LAB — LIPID PANEL: Total CHOL/HDL Ratio: 2

## 2012-09-16 LAB — TSH: TSH: 2.16 u[IU]/mL (ref 0.35–5.50)

## 2012-09-23 ENCOUNTER — Ambulatory Visit (INDEPENDENT_AMBULATORY_CARE_PROVIDER_SITE_OTHER): Payer: BC Managed Care – PPO | Admitting: Internal Medicine

## 2012-09-23 ENCOUNTER — Encounter: Payer: Self-pay | Admitting: Internal Medicine

## 2012-09-23 VITALS — BP 122/78 | HR 82 | Temp 98.1°F | Resp 10 | Ht 59.5 in | Wt 126.0 lb

## 2012-09-23 DIAGNOSIS — I10 Essential (primary) hypertension: Secondary | ICD-10-CM

## 2012-09-23 DIAGNOSIS — Z1239 Encounter for other screening for malignant neoplasm of breast: Secondary | ICD-10-CM

## 2012-09-23 DIAGNOSIS — M899 Disorder of bone, unspecified: Secondary | ICD-10-CM

## 2012-09-23 DIAGNOSIS — F411 Generalized anxiety disorder: Secondary | ICD-10-CM

## 2012-09-23 DIAGNOSIS — Z0000 Encounter for general adult medical examination without abnormal findings: Secondary | ICD-10-CM

## 2012-09-23 DIAGNOSIS — E785 Hyperlipidemia, unspecified: Secondary | ICD-10-CM

## 2012-09-23 MED ORDER — VENLAFAXINE HCL ER 150 MG PO CP24: 150.0000 mg | ORAL_CAPSULE | Freq: Every day | ORAL | Status: DC

## 2012-09-23 MED ORDER — ALPRAZOLAM 0.25 MG PO TABS: 0.2500 mg | ORAL_TABLET | Freq: Two times a day (BID) | ORAL | Status: DC | PRN

## 2012-09-23 NOTE — Assessment & Plan Note (Signed)
Prev on crestor but cost prohibitive - tried atorva>> myalgia began low dose prava  11/12 as pt prefers to be on some statin despite good HDL- Reduced to 10mg  04/2012 Reviewed excellent HDL ratio

## 2012-09-23 NOTE — Assessment & Plan Note (Signed)
Previously recommended Fosamax by prior PCP but declined to start same Inconsistent use of Ca/Vit D follow up DEXA now, consider Prolia if needed

## 2012-09-23 NOTE — Assessment & Plan Note (Signed)
on Effexor, dose increased 05/2012 Also continue low-dose Xanax to use for panic attack symptoms such as chest tightness

## 2012-09-23 NOTE — Progress Notes (Signed)
Subjective:    Patient ID: [name], female    DOB: 1950/11/11, 62 y.o.   MRN: [id number]  HPI patient is here today for annual physical. Patient feels well overall.  also reviewed chronic medical issues:  05/2012 48h stay for chest pain -  Negative CAD eval (neg serial enz and no CAD on cath 05/19/12) Neg DDimer -  hypertension - BP elevated 05/2012 during ER eval for CP (160/101), likely secondary to stress and pain. on norvasc, HCTZ, and losartan -the patient reports compliance with medication(s) as prescribed. Denies adverse side effects.   Dyslipidemia: on Statin. the patient reports compliance with medication(s) as prescribed. Denies adverse side effects.  Anxiety. on venlafaxine, dose increased September 2013, somewhat improved. intermittent panic attack symptoms precipitated by stress/work - considering job change   Past Medical History  Diagnosis Date  . ALLERGIC RHINITIS   . ANEMIA, PERNICIOUS   . ANXIETY   . DYSLIPIDEMIA   . HYPERTENSION   . CARPAL TUNNEL SYNDROME, LEFT   . OSTEOPENIA   . Selective IgA immunodeficiency   . Irritable bowel syndrome   . BILIARY DYSKINESIA   . Headache    Family History  Problem Relation Age of Onset  . Colitis Mother   . Heart disease Mother   . Hyperlipidemia Mother   . Lung cancer Mother     Cause of death  . Hypertension Mother   . Lung cancer Father   . Leukemia Brother   . Coronary artery disease Brother     MI in late 15's. Still living  . Ovarian cancer Maternal Aunt   . Diabetes Maternal Aunt   . Coronary artery disease Brother     MI in 63's. Also still living.   History  Substance Use Topics  . Smoking status: Never Smoker   . Smokeless tobacco: Not on file     Comment: Married, lives with spouse  . Alcohol Use: 0.6 oz/week    1 Glasses of wine per week     Comment: occassional    Review of Systems Constitutional: Negative for fever or weight change.  Respiratory: Negative for cough and shortness of  breath.   Cardiovascular: Negative for chest pain or palpitations.  Gastrointestinal: Negative for abdominal pain, no bowel changes.  Musculoskeletal: Negative for gait problem or joint swelling.  Skin: Negative for rash.  Neurological: Negative for dizziness or headache.  No other specific complaints in a complete review of systems (except as listed in HPI above).      Objective:   Physical Exam  BP 122/78  Pulse 82  Temp 98.1 F (36.7 C) (Oral)  Resp 10  Ht 4' 11.5" (1.511 m)  Wt 126 lb (57.153 kg)  BMI 25.02 kg/m2  SpO2 98% Wt Readings from Last 3 Encounters:  09/23/12 126 lb (57.153 kg)  06/17/12 122 lb (55.339 kg)  05/26/12 121 lb 6 oz (55.055 kg)   Constitutional: She appears well-developed and well-nourished. No distress.  HENT: Head: Normocephalic and atraumatic. Ears: B TMs ok, no erythema or effusion; Nose: Nose normal. Mouth/Throat: Oropharynx is clear and moist. No oropharyngeal exudate.  Eyes: Conjunctivae and EOM are normal. Pupils are equal, round, and reactive to light. No scleral icterus.  Neck: Normal range of motion. Neck supple. No JVD present. No thyromegaly present.  Cardiovascular: Normal rate, regular rhythm and normal heart sounds.  No murmur heard. No BLE edema. Pulmonary/Chest: Effort normal and breath sounds normal. No respiratory distress. She has no wheezes.  Abdominal:  Soft. Bowel sounds are normal. She exhibits no distension. There is no tenderness. no masses Musculoskeletal: Normal range of motion, no joint effusions. No gross deformities Neurological: She is alert and oriented to person, place, and time. No cranial nerve deficit. Coordination normal.  Skin: Skin is warm and dry. No rash noted. No erythema.  Psychiatric: She has a normal mood and affect. Her behavior is normal. Judgment and thought content normal.   Lab Results  Component Value Date   WBC 4.2* 09/16/2012   HGB 13.2 09/16/2012   HCT 38.6 09/16/2012   PLT 253.0 09/16/2012    GLUCOSE 94 09/16/2012   CHOL 205* 09/16/2012   TRIG 53.0 09/16/2012   HDL 97.90 09/16/2012   LDLDIRECT 87.0 09/16/2012   LDLCALC 85 05/19/2012   ALT 29 09/16/2012   AST 29 09/16/2012   NA 133* 09/16/2012   K 4.9 09/16/2012   CL 96 09/16/2012   CREATININE 0.7 09/16/2012   BUN 9 09/16/2012   CO2 28 09/16/2012   TSH 2.16 09/16/2012   INR 1.09 05/19/2012   HGBA1C 5.2 07/20/2011        Assessment & Plan:  CPX/v70.0 - Patient has been counseled on age-appropriate routine health concerns for screening and prevention. These are reviewed and up-to-date. Immunizations are up-to-date or declined. Labs and ECG reviewed.  Also see problem list. Medications and labs reviewed today.

## 2012-09-23 NOTE — Patient Instructions (Signed)
It was good to see you today. We have reviewed your prior records including labs and tests today Health Maintenance reviewed - all recommended immunizations and age-appropriate screenings are up-to-date. we'll make referral for mammogram and bone density at [facility] . Our office will contact you regarding appointment(s) once made. Medications reviewed and updated, no changes at this time. Refill on medication(s) as discussed today.  Please schedule followup in 6 months for weight, blood pressure and review; call sooner if problems.  Health Maintenance, Females A healthy lifestyle and preventative care can promote health and wellness.  Maintain regular health, dental, and eye exams.   Eat a healthy diet. Foods like vegetables, fruits, whole grains, low-fat dairy products, and lean protein foods contain the nutrients you need without too many calories. Decrease your intake of foods high in solid fats, added sugars, and salt. Get information about a proper diet from your caregiver, if necessary.   Regular physical exercise is one of the most important things you can do for your health. Most adults should get at least 150 minutes of moderate-intensity exercise (any activity that increases your heart rate and causes you to sweat) each week. In addition, most adults need muscle-strengthening exercises on 2 or more days a week.     Maintain a healthy weight. The body mass index (BMI) is a screening tool to identify possible weight problems. It provides an estimate of body fat based on height and weight. Your caregiver can help determine your BMI, and can help you achieve or maintain a healthy weight. For adults 20 years and older:   A BMI below 18.5 is considered underweight.   A BMI of 18.5 to 24.9 is normal.   A BMI of 25 to 29.9 is considered overweight.   A BMI of 30 and above is considered obese.   Maintain normal blood lipids and cholesterol by exercising and minimizing your intake of  saturated fat. Eat a balanced diet with plenty of fruits and vegetables. Blood tests for lipids and cholesterol should begin at age 62 and be repeated every 5 years. If your lipid or cholesterol levels are high, you are over 50, or you are a high risk for heart disease, you may need your cholesterol levels checked more frequently. Ongoing high lipid and cholesterol levels should be treated with medicines if diet and exercise are not effective.   If you smoke, find out from your caregiver how to quit. If you do not use tobacco, do not start.   If you are pregnant, do not drink alcohol. If you are breastfeeding, be very cautious about drinking alcohol. If you are not pregnant and choose to drink alcohol, do not exceed 1 drink per day. One drink is considered to be 12 ounces (355 mL) of beer, 5 ounces (148 mL) of wine, or 1.5 ounces (44 mL) of liquor.   Avoid use of street drugs. Do not share needles with anyone. Ask for help if you need support or instructions about stopping the use of drugs.   High blood pressure causes heart disease and increases the risk of stroke. Blood pressure should be checked at least every 1 to 2 years. Ongoing high blood pressure should be treated with medicines, if weight loss and exercise are not effective.   If you are 78 to 60 years old, ask your caregiver if you should take aspirin to prevent strokes.   Diabetes screening involves taking a blood sample to check your fasting blood sugar level. This should  be done once every 3 years, after age 25, if you are within normal weight and without risk factors for diabetes. Testing should be considered at a younger age or be carried out more frequently if you are overweight and have at least 1 risk factor for diabetes.   Breast cancer screening is essential preventative care for women. You should practice "breast self-awareness." This means understanding the normal appearance and feel of your breasts and may include breast  self-examination. Any changes detected, no matter how small, should be reported to a caregiver. Women in their 74s and 30s should have a clinical breast exam (CBE) by a caregiver as part of a regular health exam every 1 to 3 years. After age 40, women should have a CBE every year. Starting at age 57, women should consider having a mammogram (breast X-ray) every year. Women who have a family history of breast cancer should talk to their caregiver about genetic screening. Women at a high risk of breast cancer should talk to their caregiver about having an MRI and a mammogram every year.   The Pap test is a screening test for cervical cancer. Women should have a Pap test starting at age 75. Between ages 79 and 64, Pap tests should be repeated every 2 years. Beginning at age 27, you should have a Pap test every 3 years as long as the past 3 Pap tests have been normal. If you had a hysterectomy for a problem that was not cancer or a condition that could lead to cancer, then you no longer need Pap tests. If you are between ages 51 and 65, and you have had normal Pap tests going back 10 years, you no longer need Pap tests. If you have had past treatment for cervical cancer or a condition that could lead to cancer, you need Pap tests and screening for cancer for at least 20 years after your treatment. If Pap tests have been discontinued, risk factors (such as a new sexual partner) need to be reassessed to determine if screening should be resumed. Some women have medical problems that increase the chance of getting cervical cancer. In these cases, your caregiver may recommend more frequent screening and Pap tests.   The human papillomavirus (HPV) test is an additional test that may be used for cervical cancer screening. The HPV test looks for the virus that can cause the cell changes on the cervix. The cells collected during the Pap test can be tested for HPV. The HPV test could be used to screen women aged 32 years and  older, and should be used in women of any age who have unclear Pap test results. After the age of 74, women should have HPV testing at the same frequency as a Pap test.   Colorectal cancer can be detected and often prevented. Most routine colorectal cancer screening begins at the age of 15 and continues through age 64. However, your caregiver may recommend screening at an earlier age if you have risk factors for colon cancer. On a yearly basis, your caregiver may provide home test kits to check for hidden blood in the stool. Use of a small camera at the end of a tube, to directly examine the colon (sigmoidoscopy or colonoscopy), can detect the earliest forms of colorectal cancer. Talk to your caregiver about this at age 2, when routine screening begins. Direct examination of the colon should be repeated every 5 to 10 years through age 34, unless early forms of pre-cancerous polyps  or small growths are found.   Hepatitis C blood testing is recommended for all people born from [birth year] through 1963 and any individual with known risks for hepatitis C.   Practice safe sex. Use condoms and avoid high-risk sexual practices to reduce the spread of sexually transmitted infections (STIs). Sexually active women aged 34 and younger should be checked for Chlamydia, which is a common sexually transmitted infection. Older women with new or multiple partners should also be tested for Chlamydia. Testing for other STIs is recommended if you are sexually active and at increased risk.   Osteoporosis is a disease in which the bones lose minerals and strength with aging. This can result in serious bone fractures. The risk of osteoporosis can be identified using a bone density scan. Women ages 58 and over and women at risk for fractures or osteoporosis should discuss screening with their caregivers. Ask your caregiver whether you should be taking a calcium supplement or vitamin D to reduce the rate of osteoporosis.   Menopause can  be associated with physical symptoms and risks. Hormone replacement therapy is available to decrease symptoms and risks. You should talk to your caregiver about whether hormone replacement therapy is right for you.   Use sunscreen with a sun protection factor (SPF) of 30 or greater. Apply sunscreen liberally and repeatedly throughout the day. You should seek shade when your shadow is shorter than you. Protect yourself by wearing long sleeves, pants, a wide-brimmed hat, and sunglasses year round, whenever you are outdoors.   Notify your caregiver of new moles or changes in moles, especially if there is a change in shape or color. Also notify your caregiver if a mole is larger than the size of a pencil eraser.   Stay current with your immunizations.  Document Released: 03/09/2011 Document Revised: 11/16/2011 Document Reviewed: 03/09/2011 [facility] Patient Information 2013 [place], [place].

## 2012-09-23 NOTE — Assessment & Plan Note (Signed)
BP Readings from Last 3 Encounters:  09/23/12 122/78  06/17/12 128/86  05/26/12 140/80   On losartan (maxed dose 01/2012) and HCTZ since 07/2011 Added amlodipine 04/2012, maxed dose 05/2012 - improved Pt to monitor at home/work and call if BP >140/85 Encouraged continued compliance with anxiety management for optimal blood pressure management

## 2012-09-30 ENCOUNTER — Ambulatory Visit (INDEPENDENT_AMBULATORY_CARE_PROVIDER_SITE_OTHER)
Admission: RE | Admit: 2012-09-30 | Discharge: 2012-09-30 | Disposition: A | Discharge status: Home / Self Care | Payer: BC Managed Care – PPO | Source: Ambulatory Visit

## 2012-09-30 DIAGNOSIS — M949 Disorder of cartilage, unspecified: Secondary | ICD-10-CM

## 2012-09-30 DIAGNOSIS — M899 Disorder of bone, unspecified: Secondary | ICD-10-CM

## 2012-10-04 ENCOUNTER — Encounter: Payer: Self-pay | Admitting: Internal Medicine

## 2012-10-08 LAB — HM PAP SMEAR

## 2012-10-28 ENCOUNTER — Ambulatory Visit
Admission: RE | Admit: 2012-10-28 | Discharge: 2012-10-28 | Disposition: A | Discharge status: Home / Self Care | Payer: BC Managed Care – PPO | Source: Ambulatory Visit | Attending: Internal Medicine | Admitting: Internal Medicine

## 2012-10-28 DIAGNOSIS — Z1239 Encounter for other screening for malignant neoplasm of breast: Secondary | ICD-10-CM

## 2013-02-04 ENCOUNTER — Other Ambulatory Visit: Payer: Self-pay | Admitting: Internal Medicine

## 2013-03-08 ENCOUNTER — Other Ambulatory Visit: Payer: Self-pay | Admitting: Internal Medicine

## 2013-03-24 ENCOUNTER — Ambulatory Visit: Payer: BC Managed Care – PPO | Admitting: Internal Medicine

## 2013-03-27 ENCOUNTER — Other Ambulatory Visit: Payer: Self-pay | Admitting: Internal Medicine

## 2013-04-21 ENCOUNTER — Ambulatory Visit (INDEPENDENT_AMBULATORY_CARE_PROVIDER_SITE_OTHER): Payer: BC Managed Care – PPO | Admitting: Internal Medicine

## 2013-04-21 ENCOUNTER — Encounter: Payer: Self-pay | Admitting: Internal Medicine

## 2013-04-21 ENCOUNTER — Telehealth: Payer: Self-pay | Admitting: *Deleted

## 2013-04-21 VITALS — BP 132/82 | HR 83 | Temp 98.4°F | Wt 131.0 lb

## 2013-04-21 DIAGNOSIS — E785 Hyperlipidemia, unspecified: Secondary | ICD-10-CM

## 2013-04-21 DIAGNOSIS — IMO0002 Reserved for concepts with insufficient information to code with codable children: Secondary | ICD-10-CM

## 2013-04-21 DIAGNOSIS — S46012S Strain of muscle(s) and tendon(s) of the rotator cuff of left shoulder, sequela: Secondary | ICD-10-CM

## 2013-04-21 DIAGNOSIS — Z0000 Encounter for general adult medical examination without abnormal findings: Secondary | ICD-10-CM

## 2013-04-21 DIAGNOSIS — F411 Generalized anxiety disorder: Secondary | ICD-10-CM

## 2013-04-21 DIAGNOSIS — I10 Essential (primary) hypertension: Secondary | ICD-10-CM

## 2013-04-21 NOTE — Assessment & Plan Note (Signed)
Prev on crestor but cost prohibitive - tried atorva>> myalgia began low dose prava 11/12 as pt prefers to be on some statin despite good HDL- Reduced to 10mg 04/2012 Reviewed excellent HDL ratio The patient is asked to make an attempt to improve diet and exercise patterns to aid in medical management of this problem. 

## 2013-04-21 NOTE — Progress Notes (Signed)
  Subjective:    Patient ID: [name], female    DOB: 1951/03/31, 62 y.o.   MRN: [id number]  HPI   Here for follow up - reviewed chronic medical issues:  HTN. BP elevated 05/2012 during ER eval for CP (160/101), likely secondary to stress and pain. on norvasc, HCTZ, and losartan - takes medications as prescribed denies adverse side effects  Dyslipidemia: on Statin. the patient reports compliance with medication(s) as prescribed. Denies adverse side effects.  Anxiety. on venlafaxine, dose increased September 2013, somewhat improved. intermittent panic attack symptoms precipitated by stress/work -   Past Medical History  Diagnosis Date  . ALLERGIC RHINITIS   . ANEMIA, PERNICIOUS   . ANXIETY   . DYSLIPIDEMIA   . HYPERTENSION   . CARPAL TUNNEL SYNDROME, LEFT   . OSTEOPENIA   . Selective IgA immunodeficiency   . Irritable bowel syndrome   . BILIARY DYSKINESIA   . Headache(784.0)     Review of Systems  Constitutional: Negative for fever or weight change.  Respiratory: Negative for cough and shortness of breath.   Cardiovascular: Negative for chest pain or palpitations       Objective:   Physical Exam  BP 132/82  Pulse 83  Temp(Src) 98.4 F (36.9 C) (Oral)  Wt 131 lb (59.421 kg)  BMI 26.03 kg/m2  SpO2 97% Wt Readings from Last 3 Encounters:  04/21/13 131 lb (59.421 kg)  09/23/12 126 lb (57.153 kg)  06/17/12 122 lb (55.339 kg)   Constitutional: She appears well-developed and well-nourished. No distress.  Neck: Normal range of motion. Neck supple. No JVD present. No thyromegaly present.  Cardiovascular: Normal rate, regular rhythm and normal heart sounds.  No murmur heard. No BLE edema. Pulmonary/Chest: Effort normal and breath sounds normal. No respiratory distress. She has no wheezes.  Skin: Skin is warm and dry. No rash noted. No erythema.  Psychiatric: She has a normal mood and affect. Her behavior is normal. Judgment and thought content normal.   Lab  Results  Component Value Date   WBC 4.2* 09/16/2012   HGB 13.2 09/16/2012   HCT 38.6 09/16/2012   PLT 253.0 09/16/2012   GLUCOSE 94 09/16/2012   CHOL 205* 09/16/2012   TRIG 53.0 09/16/2012   HDL 97.90 09/16/2012   LDLDIRECT 87.0 09/16/2012   LDLCALC 85 05/19/2012   ALT 29 09/16/2012   AST 29 09/16/2012   NA 133* 09/16/2012   K 4.9 09/16/2012   CL 96 09/16/2012   CREATININE 0.7 09/16/2012   BUN 9 09/16/2012   CO2 28 09/16/2012   TSH 2.16 09/16/2012   INR 1.09 05/19/2012   HGBA1C 5.2 07/20/2011        Assessment & Plan:   See problem list. Medications and labs reviewed today.

## 2013-04-21 NOTE — Telephone Encounter (Signed)
Entered cpx labs...lmb 

## 2013-04-21 NOTE — Assessment & Plan Note (Signed)
on Effexor, dose increased 05/2012 Also continue low-dose Xanax to use for panic attack symptoms such as chest tightness Doing well; The current medical regimen is effective;  continue present plan and medications.

## 2013-04-21 NOTE — Patient Instructions (Signed)
It was good to see you today.  We have reviewed your prior records including labs and tests today  Medications reviewed and updated, no changes recommended at this time.  Please schedule followup in 6 months for annual exam and labs, call sooner if problems.  

## 2013-04-21 NOTE — Telephone Encounter (Signed)
Message copied by Deatra James on Fri Apr 21, 2013  1:59 PM ------      Message from: Livingston Diones      Created: Fri Apr 21, 2013 12:06 PM       Pt has cpe 10/20/13. Please put in lab work a week prior.  ------

## 2013-04-21 NOTE — Assessment & Plan Note (Signed)
BP Readings from Last 3 Encounters:  04/21/13 132/82  09/23/12 122/78  06/17/12 128/86   On losartan (maxed dose 01/2012) and HCTZ since 07/2011 Added amlodipine 04/2012, maxed dose 05/2012 - improved Pt to monitor at home/work and call if BP >140/85 Encouraged continued compliance with anxiety management for optimal blood pressure management

## 2013-05-12 ENCOUNTER — Encounter: Payer: Self-pay | Admitting: Internal Medicine

## 2013-05-12 ENCOUNTER — Ambulatory Visit (INDEPENDENT_AMBULATORY_CARE_PROVIDER_SITE_OTHER)
Admission: RE | Admit: 2013-05-12 | Discharge: 2013-05-12 | Disposition: A | Discharge status: Home / Self Care | Payer: BC Managed Care – PPO | Source: Ambulatory Visit | Attending: Internal Medicine | Admitting: Internal Medicine

## 2013-05-12 ENCOUNTER — Telehealth: Payer: Self-pay | Admitting: *Deleted

## 2013-05-12 ENCOUNTER — Ambulatory Visit (INDEPENDENT_AMBULATORY_CARE_PROVIDER_SITE_OTHER): Payer: BC Managed Care – PPO | Admitting: Internal Medicine

## 2013-05-12 VITALS — BP 148/80 | HR 85 | Temp 98.3°F

## 2013-05-12 DIAGNOSIS — M79609 Pain in unspecified limb: Secondary | ICD-10-CM

## 2013-05-12 DIAGNOSIS — M545 Low back pain, unspecified: Secondary | ICD-10-CM

## 2013-05-12 DIAGNOSIS — M79604 Pain in right leg: Secondary | ICD-10-CM

## 2013-05-12 DIAGNOSIS — M533 Sacrococcygeal disorders, not elsewhere classified: Secondary | ICD-10-CM

## 2013-05-12 DIAGNOSIS — S3210XA Unspecified fracture of sacrum, initial encounter for closed fracture: Secondary | ICD-10-CM

## 2013-05-12 MED ORDER — DICLOFENAC SODIUM 75 MG PO TBEC: 75.0000 mg | DELAYED_RELEASE_TABLET | Freq: Two times a day (BID) | ORAL | Status: DC

## 2013-05-12 NOTE — Progress Notes (Signed)
Subjective:    Patient ID: [name], female    DOB: 1951/02/09, 62 y.o.   MRN: [id number]  HPI Comments: Precipitated by accidental fall on front steps this AM (<6h ago) - slipped on 2nd step - landed on sacrum - pain in right posterio thigh and R lower back. Iced immediatedly  Back Pain This is a new problem. The current episode started today. The problem occurs constantly. The problem is unchanged. The pain is present in the gluteal and sacro-iliac. The quality of the pain is described as aching. The pain radiates to the right thigh. The pain is moderate. The pain is the same all the time. The symptoms are aggravated by sitting, standing, lying down and bending. Pertinent negatives include no fever.    Past Medical History  Diagnosis Date  . ALLERGIC RHINITIS   . ANEMIA, PERNICIOUS   . ANXIETY   . DYSLIPIDEMIA   . HYPERTENSION   . CARPAL TUNNEL SYNDROME, LEFT   . OSTEOPENIA   . Selective IgA immunodeficiency   . Irritable bowel syndrome   . BILIARY DYSKINESIA   . Headache(784.0)     Review of Systems  Constitutional: Negative for fever and fatigue.  Genitourinary: Negative for hematuria and flank pain.  Musculoskeletal: Positive for back pain.  Skin: Negative for rash and wound.       Objective:   Physical Exam BP 148/80  Pulse 85  Temp(Src) 98.3 F (36.8 C) (Oral)  SpO2 98% Wt Readings from Last 3 Encounters:  04/21/13 131 lb (59.421 kg)  09/23/12 126 lb (57.153 kg)  06/17/12 122 lb (55.339 kg)   Constitutional: She appears well-developed and well-nourished. Uncomfortable but no distress.  Neck: Normal range of motion. Neck supple. No JVD present. No thyromegaly present.  Cardiovascular: Normal rate, regular rhythm and normal heart sounds.  No murmur heard. No BLE edema. Pulmonary/Chest: Effort normal and breath sounds normal. No respiratory distress. She has no wheezes.  MSkel: Back: full range of motion of thoracic and lumbar spine. mildly tender to  palpation over lower lumbar vertebrae and right SI region. Positive ipsilateral straight leg raise on right. DTR's are symmetrically intact. Sensation intact in all dermatomes of the lower extremities. Full strength to manual muscle testing. patient is able to heel toe walk with mild difficulty and ambulates with antalgic gait. Skin: Skin is warm and dry. No rash, laceration, abrasion or bruise noted. No erythema.  Psychiatric: She has a normal mood and affect. Her behavior is normal. Judgment and thought content normal.   Lab Results  Component Value Date   WBC 4.2* 09/16/2012   HGB 13.2 09/16/2012   HCT 38.6 09/16/2012   PLT 253.0 09/16/2012   GLUCOSE 94 09/16/2012   CHOL 205* 09/16/2012   TRIG 53.0 09/16/2012   HDL 97.90 09/16/2012   LDLDIRECT 87.0 09/16/2012   LDLCALC 85 05/19/2012   ALT 29 09/16/2012   AST 29 09/16/2012   NA 133* 09/16/2012   K 4.9 09/16/2012   CL 96 09/16/2012   CREATININE 0.7 09/16/2012   BUN 9 09/16/2012   CO2 28 09/16/2012   TSH 2.16 09/16/2012   INR 1.09 05/19/2012   HGBA1C 5.2 07/20/2011        Assessment & Plan:   Fall with accidental trauma to lower lumbar spine/right SI region No neuro deficits on exam Check plain film to rule out fracture Treatment musculoskeletal sprain and pain with anti-inflammatories plus muscle relaxer  Rest and ice recommended Patient will call if symptoms  worse or unimproved with conservative care

## 2013-05-12 NOTE — Telephone Encounter (Signed)
Notified pt with xray results. Pt wanting md advise on walking. She states she normal walks her dog every day. Is it ok to walk or do anything...lmb

## 2013-05-12 NOTE — Telephone Encounter (Signed)
Notified pt with md response.../lmb 

## 2013-05-12 NOTE — Patient Instructions (Signed)
It was good to see you today. Test(s) ordered today. Your results will be released to MyChart (or called to you) after review, usually within 72hours after test completion. If any changes need to be made, you will be notified at that same time. Voltaren 2x/day with food for next 7 days, then as needed for pain -  Your prescription(s) have been submitted to your pharmacy. Please take as directed and contact our office if you believe you are having problem(s) with the medication(s). No other changes in medications recommended

## 2013-05-12 NOTE — Telephone Encounter (Signed)
Okay to participate in any activity as tolerated - limited only by pain tolerance. I would recommend resting with minimal activity for next 72 hours, then increase as tolerated Also continue application of ice pack to painful area

## 2013-05-14 ENCOUNTER — Encounter: Payer: Self-pay | Admitting: Internal Medicine

## 2013-05-15 MED ORDER — HYDROCODONE-ACETAMINOPHEN 5-325 MG PO TABS: 1.0000 | ORAL_TABLET | Freq: Four times a day (QID) | ORAL | Status: DC | PRN

## 2013-06-09 ENCOUNTER — Other Ambulatory Visit: Payer: Self-pay | Admitting: Internal Medicine

## 2013-06-13 ENCOUNTER — Other Ambulatory Visit: Payer: Self-pay | Admitting: Internal Medicine

## 2013-06-26 ENCOUNTER — Other Ambulatory Visit: Payer: Self-pay | Admitting: Internal Medicine

## 2013-07-10 ENCOUNTER — Other Ambulatory Visit: Payer: Self-pay | Admitting: Internal Medicine

## 2013-07-10 ENCOUNTER — Ambulatory Visit (INDEPENDENT_AMBULATORY_CARE_PROVIDER_SITE_OTHER): Payer: BC Managed Care – PPO

## 2013-07-10 DIAGNOSIS — Z23 Encounter for immunization: Secondary | ICD-10-CM

## 2013-07-10 NOTE — Telephone Encounter (Signed)
Is this ok to refill? Not sure reason why she need everyday...lmb

## 2013-09-21 ENCOUNTER — Encounter: Payer: Self-pay | Admitting: Internal Medicine

## 2013-10-02 ENCOUNTER — Other Ambulatory Visit: Payer: Self-pay | Admitting: *Deleted

## 2013-10-02 MED ORDER — ALPRAZOLAM 0.25 MG PO TABS: ORAL_TABLET | ORAL | Status: DC

## 2013-10-02 NOTE — Telephone Encounter (Signed)
Faxed script back to [facility].../lmb 

## 2013-10-12 ENCOUNTER — Other Ambulatory Visit: Payer: Self-pay | Admitting: Internal Medicine

## 2013-10-20 ENCOUNTER — Encounter: Payer: BC Managed Care – PPO | Admitting: Internal Medicine

## 2013-11-03 ENCOUNTER — Encounter: Payer: BC Managed Care – PPO | Admitting: Internal Medicine

## 2013-12-15 ENCOUNTER — Other Ambulatory Visit: Payer: Self-pay | Admitting: Internal Medicine

## 2014-01-15 ENCOUNTER — Other Ambulatory Visit: Payer: Self-pay | Admitting: Internal Medicine

## 2014-02-02 ENCOUNTER — Encounter: Payer: BC Managed Care – PPO | Admitting: Internal Medicine

## 2014-02-16 ENCOUNTER — Other Ambulatory Visit (INDEPENDENT_AMBULATORY_CARE_PROVIDER_SITE_OTHER): Payer: BC Managed Care – PPO

## 2014-02-16 DIAGNOSIS — Z0000 Encounter for general adult medical examination without abnormal findings: Secondary | ICD-10-CM

## 2014-02-16 LAB — URINALYSIS, ROUTINE W REFLEX MICROSCOPIC
Bilirubin Urine: NEGATIVE
Hgb urine dipstick: NEGATIVE
Ketones, ur: NEGATIVE
LEUKOCYTES UA: NEGATIVE
NITRITE: NEGATIVE
SPECIFIC GRAVITY, URINE: 1.01 (ref 1.000–1.030)
Total Protein, Urine: NEGATIVE
UROBILINOGEN UA: 0.2 (ref 0.0–1.0)
Urine Glucose: NEGATIVE
pH: 7.5 (ref 5.0–8.0)

## 2014-02-16 LAB — HEPATIC FUNCTION PANEL
ALBUMIN: 4.6 g/dL (ref 3.5–5.2)
ALT: 21 U/L (ref 0–35)
AST: 22 U/L (ref 0–37)
Alkaline Phosphatase: 49 U/L (ref 39–117)
Bilirubin, Direct: 0.1 mg/dL (ref 0.0–0.3)
TOTAL PROTEIN: 6.7 g/dL (ref 6.0–8.3)
Total Bilirubin: 0.7 mg/dL (ref 0.2–1.2)

## 2014-02-16 LAB — TSH: TSH: 1.94 u[IU]/mL (ref 0.35–4.50)

## 2014-02-16 LAB — CBC WITH DIFFERENTIAL/PLATELET
BASOS ABS: 0 10*3/uL (ref 0.0–0.1)
BASOS PCT: 0.4 % (ref 0.0–3.0)
EOS ABS: 0.1 10*3/uL (ref 0.0–0.7)
Eosinophils Relative: 1.8 % (ref 0.0–5.0)
HCT: 36.4 % (ref 36.0–46.0)
Hemoglobin: 12.4 g/dL (ref 12.0–15.0)
LYMPHS PCT: 17.6 % (ref 12.0–46.0)
Lymphs Abs: 1 10*3/uL (ref 0.7–4.0)
MCHC: 34.2 g/dL (ref 30.0–36.0)
MCV: 91.7 fl (ref 78.0–100.0)
Monocytes Absolute: 0.5 10*3/uL (ref 0.1–1.0)
Monocytes Relative: 8.7 % (ref 3.0–12.0)
NEUTROS PCT: 71.5 % (ref 43.0–77.0)
Neutro Abs: 4.1 10*3/uL (ref 1.4–7.7)
PLATELETS: 223 10*3/uL (ref 150.0–400.0)
RBC: 3.97 Mil/uL (ref 3.87–5.11)
RDW: 13.2 % (ref 11.5–15.5)
WBC: 5.8 10*3/uL (ref 4.0–10.5)

## 2014-02-16 LAB — LIPID PANEL
CHOL/HDL RATIO: 3
Cholesterol: 199 mg/dL (ref 0–200)
HDL: 79.6 mg/dL (ref >39.00)
LDL Cholesterol: 101 mg/dL — ABNORMAL HIGH (ref 0–99)
NONHDL: 119.4
Triglycerides: 91 mg/dL (ref 0.0–149.0)
VLDL: 18.2 mg/dL (ref 0.0–40.0)

## 2014-02-16 LAB — BASIC METABOLIC PANEL
BUN: 8 mg/dL (ref 6–23)
CALCIUM: 9.6 mg/dL (ref 8.4–10.5)
CO2: 28 meq/L (ref 19–32)
CREATININE: 0.6 mg/dL (ref 0.4–1.2)
Chloride: 97 mEq/L (ref 96–112)
GFR: 112.38 mL/min (ref >60.00)
Glucose, Bld: 100 mg/dL — ABNORMAL HIGH (ref 70–99)
Potassium: 4.4 mEq/L (ref 3.5–5.1)
Sodium: 132 mEq/L — ABNORMAL LOW (ref 135–145)

## 2014-02-23 ENCOUNTER — Encounter: Payer: Self-pay | Admitting: Internal Medicine

## 2014-02-23 ENCOUNTER — Ambulatory Visit (INDEPENDENT_AMBULATORY_CARE_PROVIDER_SITE_OTHER): Payer: BC Managed Care – PPO | Admitting: Internal Medicine

## 2014-02-23 VITALS — BP 132/80 | HR 77 | Temp 98.3°F | Ht 59.0 in | Wt 132.8 lb

## 2014-02-23 DIAGNOSIS — F411 Generalized anxiety disorder: Secondary | ICD-10-CM

## 2014-02-23 DIAGNOSIS — E785 Hyperlipidemia, unspecified: Secondary | ICD-10-CM

## 2014-02-23 DIAGNOSIS — I10 Essential (primary) hypertension: Secondary | ICD-10-CM

## 2014-02-23 DIAGNOSIS — Z0000 Encounter for general adult medical examination without abnormal findings: Secondary | ICD-10-CM

## 2014-02-23 MED ORDER — VENLAFAXINE HCL ER 150 MG PO CP24: 150.0000 mg | ORAL_CAPSULE | ORAL | Status: DC

## 2014-02-23 MED ORDER — VENLAFAXINE HCL ER 75 MG PO CP24: 75.0000 mg | ORAL_CAPSULE | ORAL | Status: DC

## 2014-02-23 MED ORDER — AMLODIPINE BESYLATE 10 MG PO TABS: ORAL_TABLET | ORAL | Status: DC

## 2014-02-23 NOTE — Assessment & Plan Note (Signed)
Prev on crestor but cost prohibitive - tried atorva>> myalgia began low dose prava 11/12 as pt prefers to be on some statin despite good HDL- Reduced to 10mg 04/2012 Reviewed excellent HDL ratio The patient is asked to make an attempt to improve diet and exercise patterns to aid in medical management of this problem. 

## 2014-02-23 NOTE — Patient Instructions (Addendum)
It was good to see you today.  We have reviewed your prior records including labs and tests today  Health Maintenance reviewed - all recommended immunizations and age-appropriate screenings are up-to-date.  Medications reviewed and updated Wean off Effexor by alternating 150 with 75 mg every other day for 2-4 weeks, and then takes 75 mg daily until we meet again Okay to stop nitrofurantoin No other changes recommended at this time.  Please schedule followup in 6 months for semiannual exam and labs, call sooner if problems.  Health Maintenance, Female A healthy lifestyle and preventative care can promote health and wellness.  Maintain regular health, dental, and eye exams.  Eat a healthy diet. Foods like vegetables, fruits, whole grains, low-fat dairy products, and lean protein foods contain the nutrients you need without too many calories. Decrease your intake of foods high in solid fats, added sugars, and salt. Get information about a proper diet from your caregiver, if necessary.  Regular physical exercise is one of the most important things you can do for your health. Most adults should get at least 150 minutes of moderate-intensity exercise (any activity that increases your heart rate and causes you to sweat) each week. In addition, most adults need muscle-strengthening exercises on 2 or more days a week.   Maintain a healthy weight. The body mass index (BMI) is a screening tool to identify possible weight problems. It provides an estimate of body fat based on height and weight. Your caregiver can help determine your BMI, and can help you achieve or maintain a healthy weight. For adults 20 years and older:  A BMI below 18.5 is considered underweight.  A BMI of 18.5 to 24.9 is normal.  A BMI of 25 to 29.9 is considered overweight.  A BMI of 30 and above is considered obese.  Maintain normal blood lipids and cholesterol by exercising and minimizing your intake of saturated fat. Eat  a balanced diet with plenty of fruits and vegetables. Blood tests for lipids and cholesterol should begin at age 73 and be repeated every 5 years. If your lipid or cholesterol levels are high, you are over 50, or you are a high risk for heart disease, you may need your cholesterol levels checked more frequently.Ongoing high lipid and cholesterol levels should be treated with medicines if diet and exercise are not effective.  If you smoke, find out from your caregiver how to quit. If you do not use tobacco, do not start.  Lung cancer screening is recommended for adults aged 1-80 years who are at high risk for developing lung cancer because of a history of smoking. Yearly low-dose computed tomography (CT) is recommended for people who have at least a 30-pack-year history of smoking and are a current smoker or have quit within the past 15 years. A pack year of smoking is smoking an average of 1 pack of cigarettes a day for 1 year (for example: 1 pack a day for 30 years or 2 packs a day for 15 years). Yearly screening should continue until the smoker has stopped smoking for at least 15 years. Yearly screening should also be stopped for people who develop a health problem that would prevent them from having lung cancer treatment.  If you are pregnant, do not drink alcohol. If you are breastfeeding, be very cautious about drinking alcohol. If you are not pregnant and choose to drink alcohol, do not exceed 1 drink per day. One drink is considered to be 12 ounces (355 mL) of  beer, 5 ounces (148 mL) of wine, or 1.5 ounces (44 mL) of liquor.  Avoid use of street drugs. Do not share needles with anyone. Ask for help if you need support or instructions about stopping the use of drugs.  High blood pressure causes heart disease and increases the risk of stroke. Blood pressure should be checked at least every 1 to 2 years. Ongoing high blood pressure should be treated with medicines, if weight loss and exercise are not  effective.  If you are 67 to 61 years old, ask your caregiver if you should take aspirin to prevent strokes.  Diabetes screening involves taking a blood sample to check your fasting blood sugar level. This should be done once every 3 years, after age 68, if you are within normal weight and without risk factors for diabetes. Testing should be considered at a younger age or be carried out more frequently if you are overweight and have at least 1 risk factor for diabetes.  Breast cancer screening is essential preventative care for women. You should practice "breast self-awareness." This means understanding the normal appearance and feel of your breasts and may include breast self-examination. Any changes detected, no matter how small, should be reported to a caregiver. Women in their 56s and 30s should have a clinical breast exam (CBE) by a caregiver as part of a regular health exam every 1 to 3 years. After age 37, women should have a CBE every year. Starting at age 27, women should consider having a mammogram (breast X-ray) every year. Women who have a family history of breast cancer should talk to their caregiver about genetic screening. Women at a high risk of breast cancer should talk to their caregiver about having an MRI and a mammogram every year.  Breast cancer gene (BRCA)-related cancer risk assessment is recommended for women who have family members with BRCA-related cancers. BRCA-related cancers include breast, ovarian, tubal, and peritoneal cancers. Having family members with these cancers may be associated with an increased risk for harmful changes (mutations) in the breast cancer genes BRCA1 and BRCA2. Results of the assessment will determine the need for genetic counseling and BRCA1 and BRCA2 testing.  The Pap test is a screening test for cervical cancer. Women should have a Pap test starting at age 37. Between ages 39 and 11, Pap tests should be repeated every 2 years. Beginning at age 46,  you should have a Pap test every 3 years as long as the past 3 Pap tests have been normal. If you had a hysterectomy for a problem that was not cancer or a condition that could lead to cancer, then you no longer need Pap tests. If you are between ages 60 and 37, and you have had normal Pap tests going back 10 years, you no longer need Pap tests. If you have had past treatment for cervical cancer or a condition that could lead to cancer, you need Pap tests and screening for cancer for at least 20 years after your treatment. If Pap tests have been discontinued, risk factors (such as a new sexual partner) need to be reassessed to determine if screening should be resumed. Some women have medical problems that increase the chance of getting cervical cancer. In these cases, your caregiver may recommend more frequent screening and Pap tests.  The human papillomavirus (HPV) test is an additional test that may be used for cervical cancer screening. The HPV test looks for the virus that can cause the cell changes on  the cervix. The cells collected during the Pap test can be tested for HPV. The HPV test could be used to screen women aged 54 years and older, and should be used in women of any age who have unclear Pap test results. After the age of 62, women should have HPV testing at the same frequency as a Pap test.  Colorectal cancer can be detected and often prevented. Most routine colorectal cancer screening begins at the age of 40 and continues through age 46. However, your caregiver may recommend screening at an earlier age if you have risk factors for colon cancer. On a yearly basis, your caregiver may provide home test kits to check for hidden blood in the stool. Use of a small camera at the end of a tube, to directly examine the colon (sigmoidoscopy or colonoscopy), can detect the earliest forms of colorectal cancer. Talk to your caregiver about this at age 30, when routine screening begins. Direct examination of  the colon should be repeated every 5 to 10 years through age 51, unless early forms of pre-cancerous polyps or small growths are found.  Hepatitis C blood testing is recommended for all people born from [birth year] through 1963 and any individual with known risks for hepatitis C.  Practice safe sex. Use condoms and avoid high-risk sexual practices to reduce the spread of sexually transmitted infections (STIs). Sexually active women aged 28 and younger should be checked for Chlamydia, which is a common sexually transmitted infection. Older women with new or multiple partners should also be tested for Chlamydia. Testing for other STIs is recommended if you are sexually active and at increased risk.  Osteoporosis is a disease in which the bones lose minerals and strength with aging. This can result in serious bone fractures. The risk of osteoporosis can be identified using a bone density scan. Women ages 49 and over and women at risk for fractures or osteoporosis should discuss screening with their caregivers. Ask your caregiver whether you should be taking a calcium supplement or vitamin D to reduce the rate of osteoporosis.  Menopause can be associated with physical symptoms and risks. Hormone replacement therapy is available to decrease symptoms and risks. You should talk to your caregiver about whether hormone replacement therapy is right for you.  Use sunscreen. Apply sunscreen liberally and repeatedly throughout the day. You should seek shade when your shadow is shorter than you. Protect yourself by wearing long sleeves, pants, a wide-brimmed hat, and sunglasses year round, whenever you are outdoors.  Notify your caregiver of new moles or changes in moles, especially if there is a change in shape or color. Also notify your caregiver if a mole is larger than the size of a pencil eraser.  Stay current with your immunizations. Document Released: 03/09/2011 Document Revised: 12/19/2012 Document Reviewed:  07/26/2013 [facility] Patient Information 2015 [place], [place]. This information is not intended to replace advice given to you by your health care provider. Make sure you discuss any questions you have with your health care provider.

## 2014-02-23 NOTE — Assessment & Plan Note (Signed)
on Effexor, dose increased 05/2012 Feels stable and would like to reduce dose/wean now Will alternate 150 with 75 mg every other day for 2-4 weeks, then 75 mg daily -erx done Also continue low-dose Xanax to use for panic attack symptoms such as chest tightness Patient will call if increasing symptoms or other problems during wean

## 2014-02-23 NOTE — Progress Notes (Signed)
Subjective:    Patient ID: [name], female    DOB: 15-Feb-1951, 64 y.o.   MRN: [id number]  HPI  patient is here today for annual physical. Patient feels well overall.  Also reviewed chronic medical issues and interval medical events  Past Medical History  Diagnosis Date  . ALLERGIC RHINITIS   . ANEMIA, PERNICIOUS   . ANXIETY   . DYSLIPIDEMIA   . HYPERTENSION   . CARPAL TUNNEL SYNDROME, LEFT   . OSTEOPENIA   . Selective IgA immunodeficiency   . Irritable bowel syndrome   . BILIARY DYSKINESIA   . Headache(784.0)    Family History  Problem Relation Age of Onset  . Colitis Mother   . Heart disease Mother   . Hyperlipidemia Mother   . Lung cancer Mother     Cause of death  . Hypertension Mother   . Lung cancer Father   . Leukemia Brother   . Coronary artery disease Brother     MI in late 68's. Still living  . Ovarian cancer Maternal Aunt   . Diabetes Maternal Aunt   . Coronary artery disease Brother     MI in 70's. Also still living.   History  Substance Use Topics  . Smoking status: Never Smoker   . Smokeless tobacco: Not on file     Comment: Married, lives with spouse  . Alcohol Use: 0.6 oz/week    1 Glasses of wine per week     Comment: occassional    Review of Systems  Constitutional: Negative for fatigue and unexpected weight change.  Respiratory: Negative for cough, shortness of breath and wheezing.   Cardiovascular: Negative for chest pain, palpitations and leg swelling.  Gastrointestinal: Negative for nausea, abdominal pain and diarrhea.  Neurological: Negative for dizziness, weakness, light-headedness and headaches.  Psychiatric/Behavioral: Negative for dysphoric mood. The patient is not nervous/anxious.   All other systems reviewed and are negative.      Objective:   Physical Exam  BP 132/80  Pulse 77  Temp(Src) 98.3 F (36.8 C) (Oral)  Ht 4\' 11"  (1.499 m)  Wt 132 lb 12.8 oz (60.238 kg)  BMI 26.81 kg/m2  SpO2 97% Wt Readings from  Last 3 Encounters:  02/23/14 132 lb 12.8 oz (60.238 kg)  04/21/13 131 lb (59.421 kg)  09/23/12 126 lb (57.153 kg)   Constitutional: She appears well-developed and well-nourished. No distress.  HENT: Head: Normocephalic and atraumatic. Ears: B TMs ok, no erythema or effusion; Nose: Nose normal. Mouth/Throat: Oropharynx is clear and moist. No oropharyngeal exudate.  Eyes: Conjunctivae and EOM are normal. Pupils are equal, round, and reactive to light. No scleral icterus.  Neck: Normal range of motion. Neck supple. No JVD present. No thyromegaly present.  Cardiovascular: Normal rate, regular rhythm and normal heart sounds.  No murmur heard. No BLE edema. Pulmonary/Chest: Effort normal and breath sounds normal. No respiratory distress. She has no wheezes.  Abdominal: Soft. Bowel sounds are normal. She exhibits no distension. There is no tenderness. no masses GU/breast: defer to gyn Musculoskeletal: Normal range of motion, no joint effusions. No gross deformities Neurological: She is alert and oriented to person, place, and time. No cranial nerve deficit. Coordination, balance, strength, speech and gait are normal.  Skin: Skin is warm and dry. No rash noted. No erythema.  Psychiatric: She has a normal mood and affect. Her behavior is normal. Judgment and thought content normal.    Lab Results  Component Value Date   WBC 5.8 02/16/2014  HGB 12.4 02/16/2014   HCT 36.4 02/16/2014   PLT 223.0 02/16/2014   GLUCOSE 100* 02/16/2014   CHOL 199 02/16/2014   TRIG 91.0 02/16/2014   HDL 79.60 02/16/2014   LDLDIRECT 87.0 09/16/2012   LDLCALC 101* 02/16/2014   ALT 21 02/16/2014   AST 22 02/16/2014   NA 132* 02/16/2014   K 4.4 02/16/2014   CL 97 02/16/2014   CREATININE 0.6 02/16/2014   BUN 8 02/16/2014   CO2 28 02/16/2014   TSH 1.94 02/16/2014   INR 1.09 05/19/2012   HGBA1C 5.2 07/20/2011    Dg Lumbar Spine 2-3 Views  05/12/2013   CLINICAL DATA:  63 year old female with low back pain. Sacrum pain status post  fall.  EXAM: LUMBAR SPINE - 2-3 VIEW  COMPARISON:  Abdomen radiographs 03/11/2006.  FINDINGS: Stable right upper quadrant surgical clips. Normal lumbar segmentation. Vertebral height and alignment within normal limits. Intermittent disc and endplate degeneration, severe T12-L1. Sacral a ala appear intact. SI joints within normal limits.  IMPRESSION: No acute fracture or listhesis identified in the lumbar spine. Multilevel chronic disc degeneration.  See sacral series reported separately.   Electronically Signed   By: Lars Pinks M.D.   On: 05/12/2013 14:14   Dg Sacrum/coccyx  05/12/2013   CLINICAL DATA:  63 year old female with pain after fall.  EXAM: SACRUM AND COCCYX - 2+ VIEW  COMPARISON:  CT Abdomen and Pelvis 04/29/2005.  FINDINGS: Upper sacral a ala and SI joints within normal limits. Bone mineralization is within normal limits. On the lateral view there is irregularity of the ventral sacral contour which was not present on the lateral scout view of the 2006 comparison. This is at the S4 and S5 levels. The coccygeal segments appear stable.  IMPRESSION: Positive for minimally displaced lower sacral (S4/S5) fracture.   Electronically Signed   By: Lars Pinks M.D.   On: 05/12/2013 14:16    ECG: normal sinus at 77 beats per minute. No ischemic change or arrhythmia    Assessment & Plan:   CPX/v70.0 Patient has been counseled on age-appropriate routine health concerns for screening and prevention. These are reviewed and up-to-date. Immunizations are up-to-date or declined. Labs and ECG reviewed.  Problem List Items Addressed This Visit   ANXIETY     on Effexor, dose increased 05/2012 Feels stable and would like to reduce dose/wean now Will alternate 150 with 75 mg every other day for 2-4 weeks, then 75 mg daily -erx done Also continue low-dose Xanax to use for panic attack symptoms such as chest tightness Patient will call if increasing symptoms or other problems during wean    Relevant Medications       venlafaxine (EFFEXOR-XR) 24 hr capsule      venlafaxine (EFFEXOR-XR) 24 hr capsule   DYSLIPIDEMIA     Prev on crestor but cost prohibitive - tried atorva>> myalgia began low dose prava  11/12 as pt prefers to be on some statin despite good HDL- Reduced to 10mg  04/2012 Reviewed excellent HDL ratio The patient is asked to make an attempt to improve diet and exercise patterns to aid in medical management of this problem.    Relevant Medications      amLODIpine (NORVASC) tablet   HYPERTENSION      BP Readings from Last 3 Encounters:  02/23/14 132/80  05/12/13 148/80  04/21/13 132/82   On losartan (maxed dose 01/2012) and HCTZ since 07/2011 Added amlodipine 04/2012, maxed dose 05/2012 - improved Pt to monitor at home/work and  call if BP >140/85 Encouraged continued anxiety management for optimal blood pressure management    Relevant Medications      amLODIpine (NORVASC) tablet   Other Relevant Orders      EKG 12-Lead (Completed)    Other Visit Diagnoses   Routine general medical examination at a health care facility    -  Primary

## 2014-02-23 NOTE — Progress Notes (Signed)
Pre visit review using our clinic review tool, if applicable. No additional management support is needed unless otherwise documented below in the visit note. 

## 2014-02-23 NOTE — Assessment & Plan Note (Signed)
BP Readings from Last 3 Encounters:  02/23/14 132/80  05/12/13 148/80  04/21/13 132/82   On losartan (maxed dose 01/2012) and HCTZ since 07/2011 Added amlodipine 04/2012, maxed dose 05/2012 - improved Pt to monitor at home/work and call if BP >140/85 Encouraged continued anxiety management for optimal blood pressure management

## 2014-02-24 ENCOUNTER — Telehealth: Payer: Self-pay | Admitting: Internal Medicine

## 2014-02-24 NOTE — Telephone Encounter (Signed)
Relevant patient education assigned to patient using Emmi. ° °

## 2014-03-16 ENCOUNTER — Other Ambulatory Visit: Payer: Self-pay | Admitting: Internal Medicine

## 2014-06-06 IMAGING — CR DG CHEST 2V
2 series · 2 of 2 positions shown · non-contrast
Comparison: None.

CLINICAL DATA: Chest pain.  Shortness of breath.

CHEST - 2 VIEW

[w chest pa]
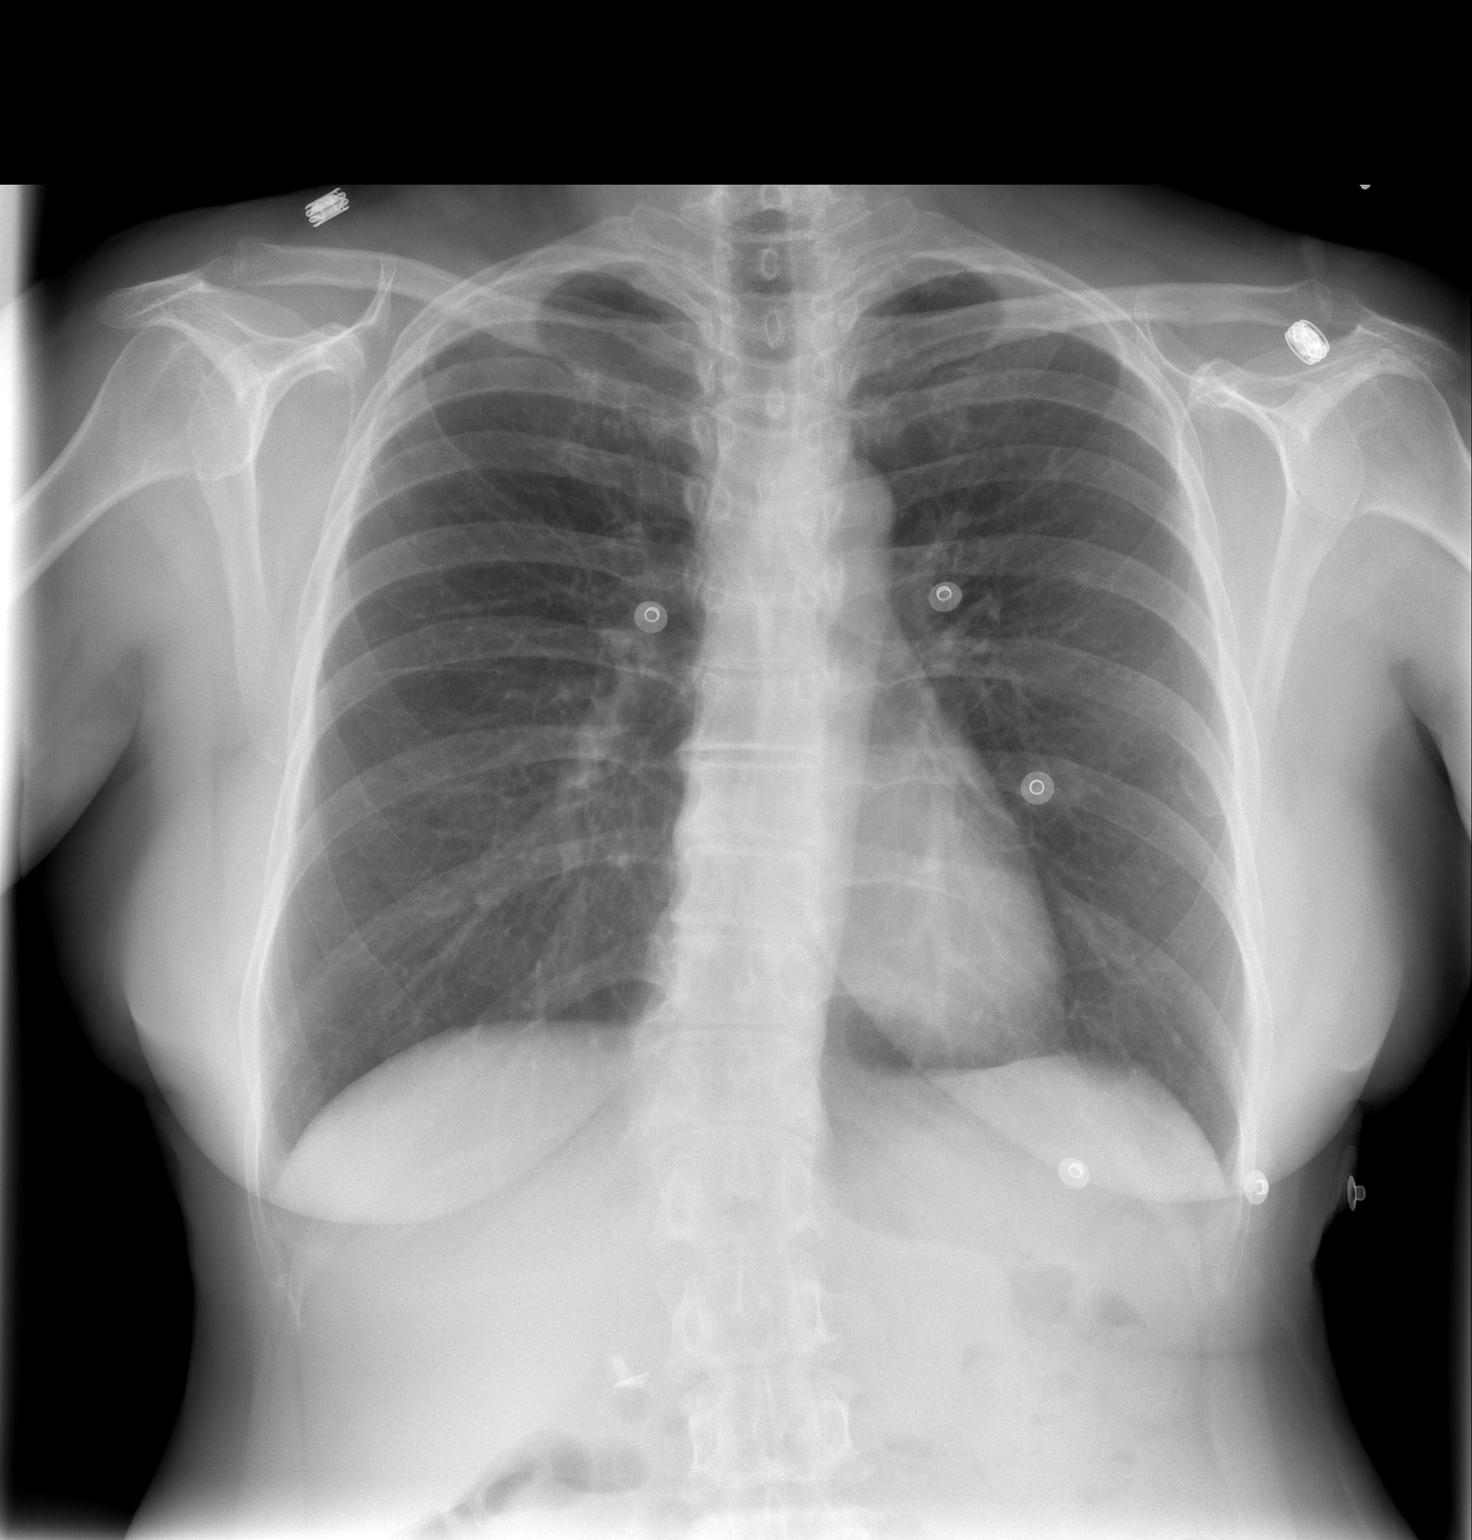

[w chest lat]
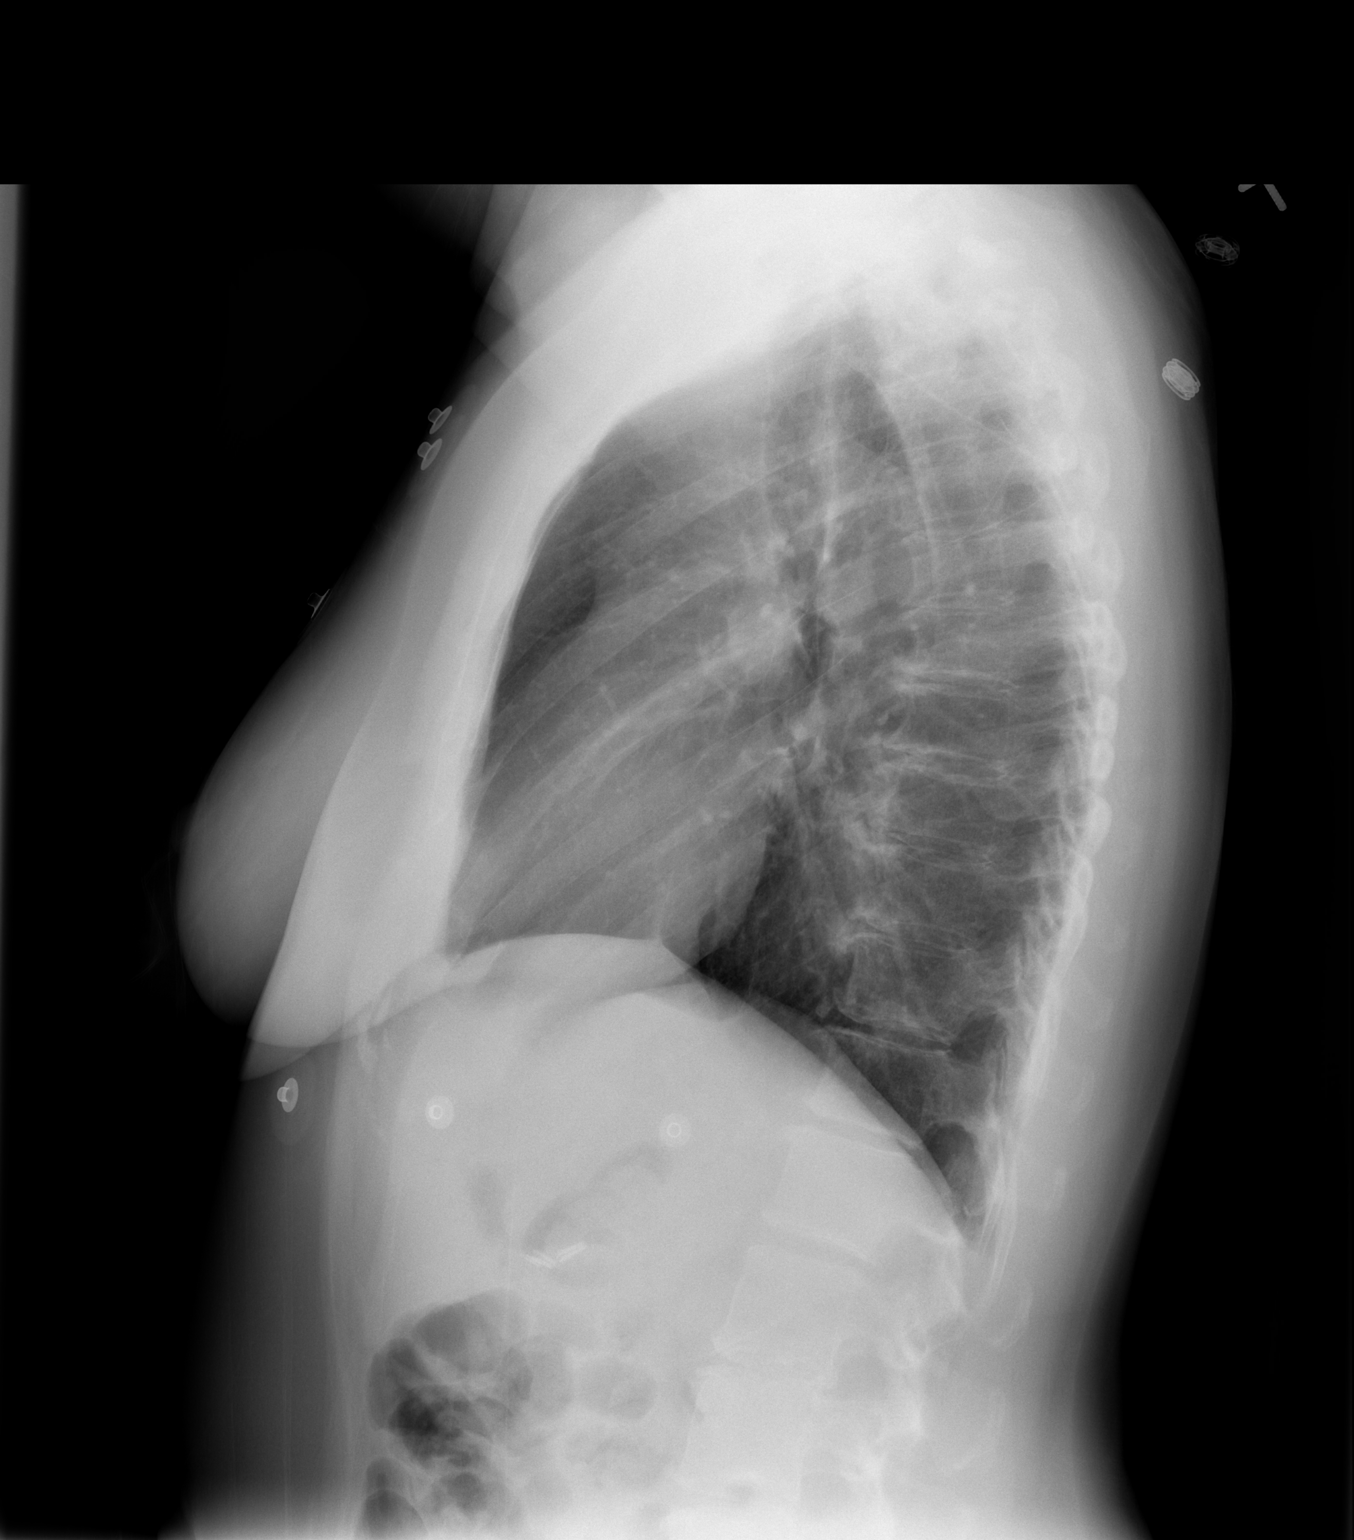

[2 of 2 positions shown; findings below may reference images not displayed]

FINDINGS: Cardiomediastinal silhouette unremarkable.   Lungs clear.
Bronchovascular markings normal.  Pulmonary vascularity normal.  No
pneumothorax.  No pleural effusions.  Degenerative changes
involving the thoracic spine and slight thoracolumbar scoliosis
convex right.
IMPRESSION: No acute cardiopulmonary disease.

## 2014-06-12 ENCOUNTER — Telehealth: Payer: Self-pay

## 2014-06-12 NOTE — Telephone Encounter (Signed)
Pharmacy request to fill Nitrofurantoin Mono-MCR 100mg 

## 2014-06-13 MED ORDER — NITROFURANTOIN MONOHYD MACRO 100 MG PO CAPS: 100.0000 mg | ORAL_CAPSULE | Freq: Two times a day (BID) | ORAL | Status: DC

## 2014-06-13 NOTE — Telephone Encounter (Signed)
done

## 2014-06-22 ENCOUNTER — Other Ambulatory Visit: Payer: Self-pay

## 2014-08-16 ENCOUNTER — Encounter: Payer: Self-pay | Admitting: Internal Medicine

## 2014-08-16 ENCOUNTER — Ambulatory Visit (INDEPENDENT_AMBULATORY_CARE_PROVIDER_SITE_OTHER): Payer: BC Managed Care – PPO | Admitting: Internal Medicine

## 2014-08-16 VITALS — BP 148/100 | HR 75 | Temp 97.9°F | Ht 59.0 in | Wt 134.8 lb

## 2014-08-16 DIAGNOSIS — E785 Hyperlipidemia, unspecified: Secondary | ICD-10-CM

## 2014-08-16 DIAGNOSIS — I10 Essential (primary) hypertension: Secondary | ICD-10-CM

## 2014-08-16 DIAGNOSIS — Z23 Encounter for immunization: Secondary | ICD-10-CM

## 2014-08-16 DIAGNOSIS — F411 Generalized anxiety disorder: Secondary | ICD-10-CM

## 2014-08-16 MED ORDER — PRAVASTATIN SODIUM 10 MG PO TABS: 10.0000 mg | ORAL_TABLET | Freq: Every evening | ORAL | Status: DC

## 2014-08-16 MED ORDER — VALSARTAN 160 MG PO TABS: 160.0000 mg | ORAL_TABLET | Freq: Every day | ORAL | Status: DC

## 2014-08-16 MED ORDER — AMLODIPINE BESYLATE 10 MG PO TABS: 10.0000 mg | ORAL_TABLET | Freq: Every day | ORAL | Status: DC

## 2014-08-16 NOTE — Assessment & Plan Note (Signed)
Prev on crestor but cost prohibitive - tried atorva>> myalgia began low dose prava 11/12 as pt prefers to be on some statin despite good HDL- Reduced to 10mg 04/2012 Reviewed excellent HDL ratio The patient is asked to make an attempt to improve diet and exercise patterns to aid in medical management of this problem. 

## 2014-08-16 NOTE — Patient Instructions (Signed)
It was good to see you today.  We have reviewed your prior records including labs and tests today  Your annual flu shot was given and/or updated today.  Medications reviewed and updated Valsartan once daily for blood pressure control Continue to wean off venlafaxine/Effexor as discussed every other day for 2 weeks, then okay to stop No other changes recommended at this time.  Please schedule followup in 6 months for annual exam and labs, call sooner if problems.

## 2014-08-16 NOTE — Progress Notes (Signed)
Subjective:    Patient ID: [name], female    DOB: November 07, 1950, 63 y.o.   MRN: [id number]  HPI  Patient is here for follow up  Reviewed chronic medical issues and interval medical events  Past Medical History  Diagnosis Date  . ALLERGIC RHINITIS   . ANEMIA, PERNICIOUS   . ANXIETY   . DYSLIPIDEMIA   . HYPERTENSION   . CARPAL TUNNEL SYNDROME, LEFT   . OSTEOPENIA   . Selective IgA immunodeficiency   . Irritable bowel syndrome   . BILIARY DYSKINESIA   . Headache(784.0)     Review of Systems  Constitutional: Positive for fatigue. Negative for fever and unexpected weight change.  Respiratory: Negative for cough and shortness of breath.   Cardiovascular: Negative for chest pain and leg swelling.  Skin: Positive for rash (with BP med, resolved with DC of same).       Objective:   Physical Exam  BP 148/100 mmHg  Pulse 75  Temp(Src) 97.9 F (36.6 C) (Oral)  Ht 4\' 11"  (1.499 m)  Wt 134 lb 12 oz (61.122 kg)  BMI 27.20 kg/m2  SpO2 97% Wt Readings from Last 3 Encounters:  08/16/14 134 lb 12 oz (61.122 kg)  02/23/14 132 lb 12.8 oz (60.238 kg)  04/21/13 131 lb (59.421 kg)   Constitutional: She appears well-developed and well-nourished. No distress.  Neck: Normal range of motion. Neck supple. No JVD present. No thyromegaly present.  Cardiovascular: Normal rate, regular rhythm and normal heart sounds.  No murmur heard. No BLE edema. Pulmonary/Chest: Effort normal and breath sounds normal. No respiratory distress. She has no wheezes.  Psychiatric: She has a normal mood and affect. Her behavior is normal. Judgment and thought content normal.   Lab Results  Component Value Date   WBC 5.8 02/16/2014   HGB 12.4 02/16/2014   HCT 36.4 02/16/2014   PLT 223.0 02/16/2014   GLUCOSE 100* 02/16/2014   CHOL 199 02/16/2014   TRIG 91.0 02/16/2014   HDL 79.60 02/16/2014   LDLDIRECT 87.0 09/16/2012   LDLCALC 101* 02/16/2014   ALT 21 02/16/2014   AST 22 02/16/2014   NA 132*  02/16/2014   K 4.4 02/16/2014   CL 97 02/16/2014   CREATININE 0.6 02/16/2014   BUN 8 02/16/2014   CO2 28 02/16/2014   TSH 1.94 02/16/2014   INR 1.09 05/19/2012   HGBA1C 5.2 07/20/2011    Dg Lumbar Spine 2-3 Views  05/12/2013   CLINICAL DATA:  63 year old female with low back pain. Sacrum pain status post fall.  EXAM: LUMBAR SPINE - 2-3 VIEW  COMPARISON:  Abdomen radiographs 03/11/2006.  FINDINGS: Stable right upper quadrant surgical clips. Normal lumbar segmentation. Vertebral height and alignment within normal limits. Intermittent disc and endplate degeneration, severe T12-L1. Sacral a ala appear intact. SI joints within normal limits.  IMPRESSION: No acute fracture or listhesis identified in the lumbar spine. Multilevel chronic disc degeneration.  See sacral series reported separately.   Electronically Signed   By: Lars Pinks M.D.   On: 05/12/2013 14:14   Dg Sacrum/coccyx  05/12/2013   CLINICAL DATA:  63 year old female with pain after fall.  EXAM: SACRUM AND COCCYX - 2+ VIEW  COMPARISON:  CT Abdomen and Pelvis 04/29/2005.  FINDINGS: Upper sacral a ala and SI joints within normal limits. Bone mineralization is within normal limits. On the lateral view there is irregularity of the ventral sacral contour which was not present on the lateral scout view of the 2006 comparison.  This is at the S4 and S5 levels. The coccygeal segments appear stable.  IMPRESSION: Positive for minimally displaced lower sacral (S4/S5) fracture.   Electronically Signed   By: Lars Pinks M.D.   On: 05/12/2013 14:16       Assessment & Plan:   Problem List Items Addressed This Visit    Anxiety state    on Effexor since 2012 Weaning off same since 02/2014 as stressors at work have reduced symptoms remain stable and would like to continue wean off med now take 75 mg every other day for 2-4 weeks, then stop Also continue low-dose Xanax to use for panic attack symptoms such as chest tightness Patient will call if increasing  symptoms or other recurrence during wean or after off med    Dyslipidemia    Prev on crestor but cost prohibitive - tried atorva>> myalgia began low dose prava 11/12 as pt prefers to be on some statin despite good HDL- Reduced to 10mg  04/2012 Reviewed excellent HDL ratio The patient is asked to make an attempt to improve diet and exercise patterns to aid in medical management of this problem.    Essential hypertension - Primary    BP Readings from Last 3 Encounters:  08/16/14 148/100  02/23/14 132/80  05/12/13 148/80   Prev on losartan with HCTZ - stopped indep due to ?rash Had taken both individually without side effects since 2013 so unsure if related Resume different ARB - valsartan - we reviewed potential risk/benefit and possible side effects - pt understands and agrees to same  continue amlodipine (started 04/2012, maxed dose 05/2012) Pt to monitor at home/work and call if BP >140/85 Encouraged continued anxiety management for optimal blood pressure management     Other Visit Diagnoses    Need for prophylactic vaccination and inoculation against influenza        Relevant Orders       Flu Vaccine QUAD 36+ mos PF IM (Fluarix Quad PF)

## 2014-08-16 NOTE — Progress Notes (Signed)
Pre visit review using our clinic review tool, if applicable. No additional management support is needed unless otherwise documented below in the visit note. 

## 2014-08-16 NOTE — Assessment & Plan Note (Signed)
BP Readings from Last 3 Encounters:  08/16/14 148/100  02/23/14 132/80  05/12/13 148/80   Prev on losartan with HCTZ - stopped indep due to ?rash Had taken both individually without side effects since 2013 so unsure if related Resume different ARB - valsartan - we reviewed potential risk/benefit and possible side effects - pt understands and agrees to same  continue amlodipine (started 04/2012, maxed dose 05/2012) Pt to monitor at home/work and call if BP >140/85 Encouraged continued anxiety management for optimal blood pressure management

## 2014-08-16 NOTE — Assessment & Plan Note (Signed)
on Effexor since 2012 Weaning off same since 02/2014 as stressors at work have reduced symptoms remain stable and would like to continue wean off med now take 75 mg every other day for 2-4 weeks, then stop Also continue low-dose Xanax to use for panic attack symptoms such as chest tightness Patient will call if increasing symptoms or other recurrence during wean or after off med

## 2014-08-17 ENCOUNTER — Other Ambulatory Visit: Payer: Self-pay | Admitting: Internal Medicine

## 2014-08-27 ENCOUNTER — Ambulatory Visit: Payer: BC Managed Care – PPO | Admitting: Internal Medicine

## 2014-09-05 ENCOUNTER — Other Ambulatory Visit: Payer: Self-pay | Admitting: Internal Medicine

## 2014-09-21 ENCOUNTER — Other Ambulatory Visit: Payer: Self-pay | Admitting: Internal Medicine

## 2014-09-21 ENCOUNTER — Telehealth: Payer: Self-pay | Admitting: Internal Medicine

## 2014-09-21 MED ORDER — METAXALONE 800 MG PO TABS: 800.0000 mg | ORAL_TABLET | Freq: Three times a day (TID) | ORAL | Status: DC | PRN

## 2014-09-21 NOTE — Telephone Encounter (Signed)
erx done

## 2014-09-21 NOTE — Telephone Encounter (Signed)
Is requesting refill on skelexin.  [facility]

## 2014-10-07 ENCOUNTER — Encounter: Payer: Self-pay | Admitting: Internal Medicine

## 2014-10-18 ENCOUNTER — Ambulatory Visit: Payer: Self-pay | Admitting: Internal Medicine

## 2015-01-10 ENCOUNTER — Encounter: Payer: Self-pay | Admitting: Family

## 2015-01-10 ENCOUNTER — Ambulatory Visit (INDEPENDENT_AMBULATORY_CARE_PROVIDER_SITE_OTHER): Payer: BLUE CROSS/BLUE SHIELD | Admitting: Family

## 2015-01-10 VITALS — BP 139/78 | HR 71 | Temp 97.8°F | Resp 18 | Ht 59.0 in | Wt 129.8 lb

## 2015-01-10 DIAGNOSIS — R350 Frequency of micturition: Secondary | ICD-10-CM | POA: Diagnosis not present

## 2015-01-10 LAB — POCT URINALYSIS DIPSTICK
BILIRUBIN UA: NEGATIVE
Glucose, UA: NEGATIVE
Ketones, UA: NEGATIVE
NITRITE UA: NEGATIVE
PH UA: 6.5
Protein, UA: NEGATIVE
SPEC GRAV UA: 1.01
Urobilinogen, UA: NEGATIVE

## 2015-01-10 MED ORDER — CIPROFLOXACIN HCL 250 MG PO TABS: 250.0000 mg | ORAL_TABLET | Freq: Two times a day (BID) | ORAL | Status: DC

## 2015-01-10 NOTE — Patient Instructions (Signed)
Thank you for choosing [facility].  Summary/Instructions:  Your prescription(s) have been submitted to your pharmacy or been printed and provided for you. Please take as directed and contact our office if you believe you are having problem(s) with the medication(s) or have any questions.  If your symptoms worsen or fail to improve, please contact our office for further instruction, or in case of emergency go directly to the emergency room at the closest medical facility.   Urinary Tract Infection Urinary tract infections (UTIs) can develop anywhere along your urinary tract. Your urinary tract is your body's drainage system for removing wastes and extra water. Your urinary tract includes two kidneys, two ureters, a bladder, and a urethra. Your kidneys are a pair of bean-shaped organs. Each kidney is about the size of your fist. They are located below your ribs, one on each side of your spine. CAUSES Infections are caused by microbes, which are microscopic organisms, including fungi, viruses, and bacteria. These organisms are so small that they can only be seen through a microscope. Bacteria are the microbes that most commonly cause UTIs. SYMPTOMS  Symptoms of UTIs may vary by age and gender of the patient and by the location of the infection. Symptoms in young women typically include a frequent and intense urge to urinate and a painful, burning feeling in the bladder or urethra during urination. Older women and men are more likely to be tired, shaky, and weak and have muscle aches and abdominal pain. A fever may mean the infection is in your kidneys. Other symptoms of a kidney infection include pain in your back or sides below the ribs, nausea, and vomiting. DIAGNOSIS To diagnose a UTI, your caregiver will ask you about your symptoms. Your caregiver also will ask to provide a urine sample. The urine sample will be tested for bacteria and white blood cells. White blood cells are made by your  body to help fight infection. TREATMENT  Typically, UTIs can be treated with medication. Because most UTIs are caused by a bacterial infection, they usually can be treated with the use of antibiotics. The choice of antibiotic and length of treatment depend on your symptoms and the type of bacteria causing your infection. HOME CARE INSTRUCTIONS  If you were prescribed antibiotics, take them exactly as your caregiver instructs you. Finish the medication even if you feel better after you have only taken some of the medication.  Drink enough water and fluids to keep your urine clear or pale yellow.  Avoid caffeine, tea, and carbonated beverages. They tend to irritate your bladder.  Empty your bladder often. Avoid holding urine for long periods of time.  Empty your bladder before and after sexual intercourse.  After a bowel movement, women should cleanse from front to back. Use each tissue only once. SEEK MEDICAL CARE IF:   You have back pain.  You develop a fever.  Your symptoms do not begin to resolve within 3 days. SEEK IMMEDIATE MEDICAL CARE IF:   You have severe back pain or lower abdominal pain.  You develop chills.  You have nausea or vomiting.  You have continued burning or discomfort with urination. MAKE SURE YOU:   Understand these instructions.  Will watch your condition.  Will get help right away if you are not doing well or get worse. Document Released: 06/03/2005 Document Revised: 02/23/2012 Document Reviewed: 10/02/2011 Exit[employer] Patient Information 2015 [employer]. This information is not intended to replace advice given to you by your health care provider.   Make sure you discuss any questions you have with your health care provider.   

## 2015-01-10 NOTE — Progress Notes (Signed)
   Subjective:    Patient ID: [name], female    DOB: 08-11-1951, 64 y.o.   MRN: [id number]  Chief Complaint  Patient presents with  . Urinary Tract Infection    HPI:  [name] is a 64 y.o. female  With a PMH of osteopenia, irritable bowel syndrome, hypertension, dyslipidemia, and anxiety who presents today for an acute visit.  Associated symptoms of dysuria and urinary frequency has been going on for about 4-5 days. Recently developed some hematuria. Modifying factors include increased water intake which helped a little, but symptoms returned. Had been taking Macrodantin for UTI prevention and stopped recently.    Allergies  Allergen Reactions  . Hydrocodone-Acetaminophen     REACTION: itching  . Oxycodone-Acetaminophen     REACTION: itching    Current Outpatient Prescriptions on File Prior to Visit  Medication Sig Dispense Refill  . ALPRAZolam (XANAX) 0.25 MG tablet Take 1 tablet (0.25 mg total) by mouth at bedtime as needed for anxiety. 30 tablet 5  . amLODipine (NORVASC) 10 MG tablet Take 1 tablet (10 mg total) by mouth daily. 90 tablet 3  . diclofenac (VOLTAREN) 75 MG EC tablet TAKE 1 TABLET BY MOUTH TWICE A DAY 60 tablet 3  . metaxalone (SKELAXIN) 800 MG tablet Take 1 tablet (800 mg total) by mouth every 8 (eight) hours as needed for muscle spasms. 60 tablet 3  . pravastatin (PRAVACHOL) 10 MG tablet Take 1 tablet (10 mg total) by mouth every evening. 90 tablet 3  . venlafaxine XR (EFFEXOR-XR) 75 MG 24 hr capsule TAKE 1 CAPSULE (75 MG TOTAL) BY MOUTH EVERY OTHER DAY. OR AS DIRECTED (Patient not taking: Reported on 01/10/2015) 90 capsule 2   No current facility-administered medications on file prior to visit.    Review of Systems  Constitutional: Positive for fatigue. Negative for fever and chills.  Genitourinary: Positive for dysuria, urgency, frequency and hematuria. Negative for flank pain.      Objective:    BP 139/78 mmHg  Pulse 71  Temp(Src) 97.8 F  (36.6 C) (Oral)  Resp 18  Ht 4\' 11"  (1.499 m)  Wt 129 lb 12 oz (58.854 kg)  BMI 26.19 kg/m2  SpO2 98% Nursing note and vital signs reviewed.  Physical Exam  Constitutional: She is oriented to person, place, and time. She appears well-developed and well-nourished. No distress.  Cardiovascular: Normal rate, regular rhythm, normal heart sounds and intact distal pulses.   Pulmonary/Chest: Effort normal and breath sounds normal.  Abdominal: There is no CVA tenderness.  Neurological: She is alert and oriented to person, place, and time.  Skin: Skin is warm and dry.  Psychiatric: She has a normal mood and affect. Her behavior is normal. Judgment and thought content normal.       Assessment & Plan:

## 2015-01-10 NOTE — Assessment & Plan Note (Signed)
In office urinalysis positive for leukocytes and blood;  Negative for nitrites.  Symptoms and lab work consistent with cystitis. Start ciprofloxacin. Urine sent for culture. Follow up if symptoms worsen or fail to improve.

## 2015-01-10 NOTE — Progress Notes (Signed)
Pre visit review using our clinic review tool, if applicable. No additional management support is needed unless otherwise documented below in the visit note. 

## 2015-01-10 NOTE — Addendum Note (Signed)
Addended by: Delice Bison E on: 01/10/2015 03:00 PM   Modules accepted: Orders

## 2015-01-11 ENCOUNTER — Telehealth: Payer: Self-pay | Admitting: *Deleted

## 2015-01-11 NOTE — Telephone Encounter (Signed)
[facility] - Client TELEPHONE [facility] Patient Name: [name] Gender: Female DOB: [birth year] Age: 64 Y 22 M 7 D Return Phone Number: [phone] (Primary), [phone] (Secondary) Address: City/State/Zip: [place] [employer] [facility] - Client Client Site [place] - Night Physician [place], [place] Type Call Caller Name Broussard Phone Number [phone] Relationship To Patient Self Is this call to report lab results? No Call Type General Information Initial Comment caller states she has blood in her urine - caller needs to leave a msg that she has a bladder or kidney infection and needs to come in for a urine test. Caller declined nurse triage and preferred the ofc to call her tomorrow General Information Type Message Only Nurse Assessment Guidelines Guideline Title Affirmed Question Affirmed Notes Nurse Date/Time ([place] Time) Disp. Time Eilene Ghazi Time) Disposition Final User 01/08/2015 7:01:21 PM General Information Provided Yes [place], [place] Muscat After Care Instructions Given Call Event Type User Date / Time Description

## 2015-01-14 ENCOUNTER — Other Ambulatory Visit: Payer: BLUE CROSS/BLUE SHIELD

## 2015-01-14 DIAGNOSIS — R350 Frequency of micturition: Secondary | ICD-10-CM

## 2015-01-16 LAB — URINE CULTURE

## 2015-01-17 ENCOUNTER — Encounter: Payer: Self-pay | Admitting: Family

## 2015-03-07 ENCOUNTER — Ambulatory Visit (INDEPENDENT_AMBULATORY_CARE_PROVIDER_SITE_OTHER)
Admission: RE | Admit: 2015-03-07 | Discharge: 2015-03-07 | Disposition: A | Discharge status: Home / Self Care | Payer: BLUE CROSS/BLUE SHIELD | Source: Ambulatory Visit | Attending: Internal Medicine | Admitting: Internal Medicine

## 2015-03-07 ENCOUNTER — Other Ambulatory Visit (INDEPENDENT_AMBULATORY_CARE_PROVIDER_SITE_OTHER): Payer: BLUE CROSS/BLUE SHIELD

## 2015-03-07 ENCOUNTER — Encounter: Payer: Self-pay | Admitting: Internal Medicine

## 2015-03-07 ENCOUNTER — Ambulatory Visit (INDEPENDENT_AMBULATORY_CARE_PROVIDER_SITE_OTHER): Payer: BLUE CROSS/BLUE SHIELD | Admitting: Internal Medicine

## 2015-03-07 VITALS — BP 118/78 | HR 66 | Temp 97.8°F | Ht 59.0 in | Wt 132.5 lb

## 2015-03-07 DIAGNOSIS — M8580 Other specified disorders of bone density and structure, unspecified site: Secondary | ICD-10-CM

## 2015-03-07 DIAGNOSIS — Z0000 Encounter for general adult medical examination without abnormal findings: Secondary | ICD-10-CM

## 2015-03-07 DIAGNOSIS — I10 Essential (primary) hypertension: Secondary | ICD-10-CM

## 2015-03-07 DIAGNOSIS — E785 Hyperlipidemia, unspecified: Secondary | ICD-10-CM | POA: Diagnosis not present

## 2015-03-07 DIAGNOSIS — M810 Age-related osteoporosis without current pathological fracture: Secondary | ICD-10-CM | POA: Diagnosis not present

## 2015-03-07 DIAGNOSIS — Z1239 Encounter for other screening for malignant neoplasm of breast: Secondary | ICD-10-CM

## 2015-03-07 DIAGNOSIS — Z1211 Encounter for screening for malignant neoplasm of colon: Secondary | ICD-10-CM

## 2015-03-07 LAB — CBC WITH DIFFERENTIAL/PLATELET
BASOS ABS: 0 10*3/uL (ref 0.0–0.1)
Basophils Relative: 0.5 % (ref 0.0–3.0)
EOS ABS: 0.2 10*3/uL (ref 0.0–0.7)
Eosinophils Relative: 4.1 % (ref 0.0–5.0)
HEMATOCRIT: 41.2 % (ref 36.0–46.0)
Hemoglobin: 14.2 g/dL (ref 12.0–15.0)
Lymphocytes Relative: 25.6 % (ref 12.0–46.0)
Lymphs Abs: 1.2 10*3/uL (ref 0.7–4.0)
MCHC: 34.5 g/dL (ref 30.0–36.0)
MCV: 88.4 fl (ref 78.0–100.0)
Monocytes Absolute: 0.4 10*3/uL (ref 0.1–1.0)
Monocytes Relative: 7.6 % (ref 3.0–12.0)
Neutro Abs: 2.9 10*3/uL (ref 1.4–7.7)
Neutrophils Relative %: 62.2 % (ref 43.0–77.0)
Platelets: 192 10*3/uL (ref 150.0–400.0)
RBC: 4.66 Mil/uL (ref 3.87–5.11)
RDW: 13.9 % (ref 11.5–15.5)
WBC: 4.7 10*3/uL (ref 4.0–10.5)

## 2015-03-07 LAB — HEPATIC FUNCTION PANEL
ALT: 21 U/L (ref 0–35)
AST: 19 U/L (ref 0–37)
Albumin: 5 g/dL (ref 3.5–5.2)
Alkaline Phosphatase: 77 U/L (ref 39–117)
BILIRUBIN TOTAL: 0.7 mg/dL (ref 0.2–1.2)
Bilirubin, Direct: 0.1 mg/dL (ref 0.0–0.3)
TOTAL PROTEIN: 7.2 g/dL (ref 6.0–8.3)

## 2015-03-07 LAB — URINALYSIS, ROUTINE W REFLEX MICROSCOPIC
BILIRUBIN URINE: NEGATIVE
Hgb urine dipstick: NEGATIVE
KETONES UR: NEGATIVE
Leukocytes, UA: NEGATIVE
Nitrite: NEGATIVE
PH: 7 (ref 5.0–8.0)
RBC / HPF: NONE SEEN (ref 0–?)
Specific Gravity, Urine: <=1.005 — AB (ref 1.000–1.030)
TOTAL PROTEIN, URINE-UPE24: NEGATIVE
URINE GLUCOSE: NEGATIVE
Urobilinogen, UA: 0.2 (ref 0.0–1.0)
WBC UA: NONE SEEN (ref 0–?)

## 2015-03-07 LAB — LIPID PANEL
CHOL/HDL RATIO: 3
CHOLESTEROL: 231 mg/dL — AB (ref 0–200)
HDL: 74.2 mg/dL (ref >39.00)
LDL CALC: 130 mg/dL — AB (ref 0–99)
NONHDL: 156.8
Triglycerides: 135 mg/dL (ref 0.0–149.0)
VLDL: 27 mg/dL (ref 0.0–40.0)

## 2015-03-07 LAB — BASIC METABOLIC PANEL
BUN: 8 mg/dL (ref 6–23)
CALCIUM: 9.9 mg/dL (ref 8.4–10.5)
CO2: 31 mEq/L (ref 19–32)
Chloride: 104 mEq/L (ref 96–112)
Creatinine, Ser: 0.72 mg/dL (ref 0.40–1.20)
GFR: 87.26 mL/min (ref >60.00)
GLUCOSE: 98 mg/dL (ref 70–99)
POTASSIUM: 4.3 meq/L (ref 3.5–5.1)
Sodium: 141 mEq/L (ref 135–145)

## 2015-03-07 LAB — TSH: TSH: 1.76 u[IU]/mL (ref 0.35–4.50)

## 2015-03-07 MED ORDER — AMLODIPINE BESYLATE 10 MG PO TABS: 10.0000 mg | ORAL_TABLET | Freq: Every day | ORAL | Status: DC

## 2015-03-07 MED ORDER — METAXALONE 800 MG PO TABS: 800.0000 mg | ORAL_TABLET | Freq: Three times a day (TID) | ORAL | Status: DC | PRN

## 2015-03-07 MED ORDER — PRAVASTATIN SODIUM 10 MG PO TABS: 10.0000 mg | ORAL_TABLET | Freq: Every evening | ORAL | Status: DC

## 2015-03-07 NOTE — Patient Instructions (Addendum)
It was good to see you today.  We have reviewed your prior records including labs and tests today  Health Maintenance reviewed - will make referral for bone density, colonoscopy and mammogram as discussed. My office will call with these appointments and results. Discuss with [facility] your eligibility for shingles vaccine. All other recommended immunizations and age-appropriate screenings are up-to-date.  Test(s) ordered today. Your results will be released to [place] (or called to you) after review, usually within 72hours after test completion. If any changes need to be made, you will be notified at that same time.  Medications reviewed and updated, no changes recommended at this time.  Please schedule followup in 12 months for annual exam and labs, call sooner if problems.  Health Maintenance Adopting a healthy lifestyle and getting preventive care can go a long way to promote health and wellness. Talk with your health care provider about what schedule of regular examinations is right for you. This is a good chance for you to check in with your provider about disease prevention and staying healthy. In between checkups, there are plenty of things you can do on your own. Experts have done a lot of research about which lifestyle changes and preventive measures are most likely to keep you healthy. Ask your health care provider for more information. WEIGHT AND DIET  Eat a healthy diet  Be sure to include plenty of vegetables, fruits, low-fat dairy products, and lean protein.  Do not eat a lot of foods high in solid fats, added sugars, or salt.  Get regular exercise. This is one of the most important things you can do for your health.  Most adults should exercise for at least 150 minutes each week. The exercise should increase your heart rate and make you sweat (moderate-intensity exercise).  Most adults should also do strengthening exercises at least twice a week. This is in  addition to the moderate-intensity exercise.  Maintain a healthy weight  Body mass index (BMI) is a measurement that can be used to identify possible weight problems. It estimates body fat based on height and weight. Your health care provider can help determine your BMI and help you achieve or maintain a healthy weight.  For females 69 years of age and older:   A BMI below 18.5 is considered underweight.  A BMI of 18.5 to 24.9 is normal.  A BMI of 25 to 29.9 is considered overweight.  A BMI of 30 and above is considered obese.  Watch levels of cholesterol and blood lipids  You should start having your blood tested for lipids and cholesterol at 62 years of age, then have this test every 5 years.  You may need to have your cholesterol levels checked more often if:  Your lipid or cholesterol levels are high.  You are older than 62 years of age.  You are at high risk for heart disease.  CANCER SCREENING   Lung Cancer  Lung cancer screening is recommended for adults 36-67 years old who are at high risk for lung cancer because of a history of smoking.  A yearly low-dose CT scan of the lungs is recommended for people who:  Currently smoke.  Have quit within the past 15 years.  Have at least a 30-pack-year history of smoking. A pack year is smoking an average of one pack of cigarettes a day for 1 year.  Yearly screening should continue until it has been 15 years since you quit.  Yearly screening should  stop if you develop a health problem that would prevent you from having lung cancer treatment.  Breast Cancer  Practice breast self-awareness. This means understanding how your breasts normally appear and feel.  It also means doing regular breast self-exams. Let your health care provider know about any changes, no matter how small.  If you are in your 20s or 30s, you should have a clinical breast exam (CBE) by a health care provider every 1-3 years as part of a regular  health exam.  If you are 48 or older, have a CBE every year. Also consider having a breast X-ray (mammogram) every year.  If you have a family history of breast cancer, talk to your health care provider about genetic screening.  If you are at high risk for breast cancer, talk to your health care provider about having an MRI and a mammogram every year.  Breast cancer gene (BRCA) assessment is recommended for women who have family members with BRCA-related cancers. BRCA-related cancers include:  Breast.  Ovarian.  Tubal.  Peritoneal cancers.  Results of the assessment will determine the need for genetic counseling and BRCA1 and BRCA2 testing. Cervical Cancer Routine pelvic examinations to screen for cervical cancer are no longer recommended for nonpregnant women who are considered low risk for cancer of the pelvic organs (ovaries, uterus, and vagina) and who do not have symptoms. A pelvic examination may be necessary if you have symptoms including those associated with pelvic infections. Ask your health care provider if a screening pelvic exam is right for you.   The Pap test is the screening test for cervical cancer for women who are considered at risk.  If you had a hysterectomy for a problem that was not cancer or a condition that could lead to cancer, then you no longer need Pap tests.  If you are older than 65 years, and you have had normal Pap tests for the past 10 years, you no longer need to have Pap tests.  If you have had past treatment for cervical cancer or a condition that could lead to cancer, you need Pap tests and screening for cancer for at least 20 years after your treatment.  If you no longer get a Pap test, assess your risk factors if they change (such as having a new sexual partner). This can affect whether you should start being screened again.  Some women have medical problems that increase their chance of getting cervical cancer. If this is the case for you, your  health care provider may recommend more frequent screening and Pap tests.  The human papillomavirus (HPV) test is another test that may be used for cervical cancer screening. The HPV test looks for the virus that can cause cell changes in the cervix. The cells collected during the Pap test can be tested for HPV.  The HPV test can be used to screen women 3 years of age and older. Getting tested for HPV can extend the interval between normal Pap tests from three to five years.  An HPV test also should be used to screen women of any age who have unclear Pap test results.  After 63 years of age, women should have HPV testing as often as Pap tests.  Colorectal Cancer  This type of cancer can be detected and often prevented.  Routine colorectal cancer screening usually begins at 62 years of age and continues through 62 years of age.  Your health care provider may recommend screening at an earlier age  if you have risk factors for colon cancer.  Your health care provider may also recommend using home test kits to check for hidden blood in the stool.  A small camera at the end of a tube can be used to examine your colon directly (sigmoidoscopy or colonoscopy). This is done to check for the earliest forms of colorectal cancer.  Routine screening usually begins at age 45.  Direct examination of the colon should be repeated every 5-10 years through 62 years of age. However, you may need to be screened more often if early forms of precancerous polyps or small growths are found. Skin Cancer  Check your skin from head to toe regularly.  Tell your health care provider about any new moles or changes in moles, especially if there is a change in a mole's shape or color.  Also tell your health care provider if you have a mole that is larger than the size of a pencil eraser.  Always use sunscreen. Apply sunscreen liberally and repeatedly throughout the day.  Protect yourself by wearing long sleeves,  pants, a wide-brimmed hat, and sunglasses whenever you are outside. HEART DISEASE, DIABETES, AND HIGH BLOOD PRESSURE   Have your blood pressure checked at least every 1-2 years. High blood pressure causes heart disease and increases the risk of stroke.  If you are between 40 years and 50 years old, ask your health care provider if you should take aspirin to prevent strokes.  Have regular diabetes screenings. This involves taking a blood sample to check your fasting blood sugar level.  If you are at a normal weight and have a low risk for diabetes, have this test once every three years after 62 years of age.  If you are overweight and have a high risk for diabetes, consider being tested at a younger age or more often. PREVENTING INFECTION  Hepatitis B  If you have a higher risk for hepatitis B, you should be screened for this virus. You are considered at high risk for hepatitis B if:  You were born in a country where hepatitis B is common. Ask your health care provider which countries are considered high risk.  Your parents were born in a high-risk country, and you have not been immunized against hepatitis B (hepatitis B vaccine).  You have HIV or AIDS.  You use needles to inject street drugs.  You live with someone who has hepatitis B.  You have had sex with someone who has hepatitis B.  You get hemodialysis treatment.  You take certain medicines for conditions, including cancer, organ transplantation, and autoimmune conditions. Hepatitis C  Blood testing is recommended for:  Everyone born from [birth year] through 1963.  Anyone with known risk factors for hepatitis C. Sexually transmitted infections (STIs)  You should be screened for sexually transmitted infections (STIs) including gonorrhea and chlamydia if:  You are sexually active and are younger than 62 years of age.  You are older than 62 years of age and your health care provider tells you that you are at risk for this  type of infection.  Your sexual activity has changed since you were last screened and you are at an increased risk for chlamydia or gonorrhea. Ask your health care provider if you are at risk.  If you do not have HIV, but are at risk, it may be recommended that you take a prescription medicine daily to prevent HIV infection. This is called pre-exposure prophylaxis (PrEP). You are considered at risk if:  You are sexually active and do not regularly use condoms or know the HIV status of your partner(s).  You take drugs by injection.  You are sexually active with a partner who has HIV. Talk with your health care provider about whether you are at high risk of being infected with HIV. If you choose to begin PrEP, you should first be tested for HIV. You should then be tested every 3 months for as long as you are taking PrEP.  PREGNANCY   If you are premenopausal and you may become pregnant, ask your health care provider about preconception counseling.  If you may become pregnant, take 400 to 800 micrograms (mcg) of folic acid every day.  If you want to prevent pregnancy, talk to your health care provider about birth control (contraception). OSTEOPOROSIS AND MENOPAUSE   Osteoporosis is a disease in which the bones lose minerals and strength with aging. This can result in serious bone fractures. Your risk for osteoporosis can be identified using a bone density scan.  If you are 59 years of age or older, or if you are at risk for osteoporosis and fractures, ask your health care provider if you should be screened.  Ask your health care provider whether you should take a calcium or vitamin D supplement to lower your risk for osteoporosis.  Menopause may have certain physical symptoms and risks.  Hormone replacement therapy may reduce some of these symptoms and risks. Talk to your health care provider about whether hormone replacement therapy is right for you.  HOME CARE INSTRUCTIONS   Schedule  regular health, dental, and eye exams.  Stay current with your immunizations.   Do not use any tobacco products including cigarettes, chewing tobacco, or electronic cigarettes.  If you are pregnant, do not drink alcohol.  If you are breastfeeding, limit how much and how often you drink alcohol.  Limit alcohol intake to no more than 1 drink per day for nonpregnant women. One drink equals 12 ounces of beer, 5 ounces of wine, or 1 ounces of hard liquor.  Do not use street drugs.  Do not share needles.  Ask your health care provider for help if you need support or information about quitting drugs.  Tell your health care provider if you often feel depressed.  Tell your health care provider if you have ever been abused or do not feel safe at home. Document Released: 03/09/2011 Document Revised: 01/08/2014 Document Reviewed: 07/26/2013 [facility] Patient Information 2015 [place], [place]. This information is not intended to replace advice given to you by your health care provider. Make sure you discuss any questions you have with your health care provider.

## 2015-03-07 NOTE — Assessment & Plan Note (Addendum)
BP Readings from Last 3 Encounters:  03/07/15 118/78  01/10/15 139/78  08/16/14 148/100  Prev on losartan with HCTZ - stopped indep due to ?rash Had taken both individually without side effects since 2013 so unsure if related Resume different ARB - valsartan late 2015 but stopped all ARB due to side effects  Continue solo amlodipine (started 04/2012, maxed dose 05/2012) as doing well Pt to monitor at home/work and call if BP >140/85 Encouraged continued anxiety management for optimal blood pressure management

## 2015-03-07 NOTE — Progress Notes (Signed)
Subjective:    Patient ID: NEETI KNUDTSON, female    DOB: 1951/04/24, 64 y.o.   MRN: [id number]  HPI  patient is here today for annual physical. Patient feels well overall.   Past Medical History  Diagnosis Date  . ALLERGIC RHINITIS   . ANEMIA, PERNICIOUS   . ANXIETY   . DYSLIPIDEMIA   . HYPERTENSION   . CARPAL TUNNEL SYNDROME, LEFT   . OSTEOPENIA   . Selective IgA immunodeficiency   . Irritable bowel syndrome   . BILIARY DYSKINESIA   . Headache(784.0)    Family History  Problem Relation Age of Onset  . Colitis Mother   . Heart disease Mother   . Hyperlipidemia Mother   . Lung cancer Mother     Cause of death  . Hypertension Mother   . Lung cancer Father   . Leukemia Brother   . Coronary artery disease Brother     MI in late 6's. Still living  . Ovarian cancer Maternal Aunt   . Diabetes Maternal Aunt   . Coronary artery disease Brother     MI in 64's. Also still living.   History  Substance Use Topics  . Smoking status: Never Smoker   . Smokeless tobacco: Not on file  . Alcohol Use: 0.6 oz/week    1 Glasses of wine per week     Comment: occassional    Review of Systems  Constitutional: Negative for fatigue and unexpected weight change.  Respiratory: Negative for cough, shortness of breath and wheezing.   Cardiovascular: Negative for chest pain, palpitations and leg swelling.  Gastrointestinal: Negative for nausea, abdominal pain and diarrhea.  Neurological: Negative for dizziness, weakness, light-headedness and headaches.  Psychiatric/Behavioral: Negative for dysphoric mood. The patient is not nervous/anxious.   All other systems reviewed and are negative.      Objective:    Physical Exam  Constitutional: She is oriented to person, place, and time. She appears well-developed and well-nourished. No distress.  HENT:  Head: Normocephalic and atraumatic.  Right Ear: External ear normal.  Left Ear: External ear normal.  Nose: Nose normal.    Mouth/Throat: Oropharynx is clear and moist. No oropharyngeal exudate.  Eyes: EOM are normal. Pupils are equal, round, and reactive to light. Right eye exhibits no discharge. Left eye exhibits no discharge. No scleral icterus.  Neck: Normal range of motion. Neck supple. No JVD present. No tracheal deviation present. No thyromegaly present.  Cardiovascular: Normal rate, regular rhythm, normal heart sounds and intact distal pulses.  Exam reveals no friction rub.   No murmur heard. Pulmonary/Chest: Effort normal and breath sounds normal. No respiratory distress. She has no wheezes. She has no rales. She exhibits no tenderness.  Abdominal: Soft. Bowel sounds are normal. She exhibits no distension and no mass. There is no tenderness. There is no rebound and no guarding.  Genitourinary:  Defer to gyn  Musculoskeletal: Normal range of motion.  No gross deformities  Lymphadenopathy:    She has no cervical adenopathy.  Neurological: She is alert and oriented to person, place, and time. She has normal reflexes. No cranial nerve deficit.  Skin: Skin is warm and dry. No rash noted. She is not diaphoretic. No erythema.  Psychiatric: She has a normal mood and affect. Her behavior is normal. Judgment and thought content normal.  Nursing note and vitals reviewed.   BP 118/78 mmHg  Pulse 66  Temp(Src) 97.8 F (36.6 C) (Oral)  Ht 4\' 11"  (1.499 m)  Wt  132 lb 8 oz (60.102 kg)  BMI 26.75 kg/m2  SpO2 99% Wt Readings from Last 3 Encounters:  03/07/15 132 lb 8 oz (60.102 kg)  01/10/15 129 lb 12 oz (58.854 kg)  08/16/14 134 lb 12 oz (61.122 kg)    Lab Results  Component Value Date   WBC 5.8 02/16/2014   HGB 12.4 02/16/2014   HCT 36.4 02/16/2014   PLT 223.0 02/16/2014   GLUCOSE 100* 02/16/2014   CHOL 199 02/16/2014   TRIG 91.0 02/16/2014   HDL 79.60 02/16/2014   LDLDIRECT 87.0 09/16/2012   LDLCALC 101* 02/16/2014   ALT 21 02/16/2014   AST 22 02/16/2014   NA 132* 02/16/2014   K 4.4 02/16/2014    CL 97 02/16/2014   CREATININE 0.6 02/16/2014   BUN 8 02/16/2014   CO2 28 02/16/2014   TSH 1.94 02/16/2014   INR 1.09 05/19/2012   HGBA1C 5.2 07/20/2011    Dg Lumbar Spine 2-3 Views  05/12/2013   CLINICAL DATA:  64 year old female with low back pain. Sacrum pain status post fall.  EXAM: LUMBAR SPINE - 2-3 VIEW  COMPARISON:  Abdomen radiographs 03/11/2006.  FINDINGS: Stable right upper quadrant surgical clips. Normal lumbar segmentation. Vertebral height and alignment within normal limits. Intermittent disc and endplate degeneration, severe T12-L1. Sacral a ala appear intact. SI joints within normal limits.  IMPRESSION: No acute fracture or listhesis identified in the lumbar spine. Multilevel chronic disc degeneration.  See sacral series reported separately.   Electronically Signed   By: Lars Pinks M.D.   On: 05/12/2013 14:14   Dg Sacrum/coccyx  05/12/2013   CLINICAL DATA:  64 year old female with pain after fall.  EXAM: SACRUM AND COCCYX - 2+ VIEW  COMPARISON:  CT Abdomen and Pelvis 04/29/2005.  FINDINGS: Upper sacral a ala and SI joints within normal limits. Bone mineralization is within normal limits. On the lateral view there is irregularity of the ventral sacral contour which was not present on the lateral scout view of the 2006 comparison. This is at the S4 and S5 levels. The coccygeal segments appear stable.  IMPRESSION: Positive for minimally displaced lower sacral (S4/S5) fracture.   Electronically Signed   By: Lars Pinks M.D.   On: 05/12/2013 14:16       Assessment & Plan:   CPX/z00.00 - Patient has been counseled on age-appropriate routine health concerns for screening and prevention. These are reviewed and up-to-date. Immunizations are up-to-date or declined. Labs ordered and reviewed.  Problem List Items Addressed This Visit    Dyslipidemia    Prev on crestor but cost prohibitive - tried atorva>> myalgia began low dose prava 11/12 as pt prefers to be on some statin despite good  HDL- Reduced to 10mg  04/2012 Reviewed excellent HDL ratio The patient is asked to make an attempt to improve diet and exercise patterns to aid in medical management of this problem.      Relevant Medications   pravastatin (PRAVACHOL) 10 MG tablet   Other Relevant Orders   Lipid panel   Essential hypertension    BP Readings from Last 3 Encounters:  03/07/15 118/78  01/10/15 139/78  08/16/14 148/100  Prev on losartan with HCTZ - stopped indep due to ?rash Had taken both individually without side effects since 2013 so unsure if related Resume different ARB - valsartan late 2015 but stopped all ARB due to side effects  Continue solo amlodipine (started 04/2012, maxed dose 05/2012) as doing well Pt to monitor at home/work and call  if BP >140/85 Encouraged continued anxiety management for optimal blood pressure management      Relevant Medications   pravastatin (PRAVACHOL) 10 MG tablet   amLODipine (NORVASC) 10 MG tablet   Other Relevant Orders   Basic metabolic panel   Osteopenia    Previously recommended Fosamax by prior PCP but declined to start same Inconsistent use of Ca/Vit D follow up DEXA q89yr, consider Prolia if needed       Other Visit Diagnoses    Postmenopausal bone loss    -  Primary    Relevant Orders    DG Bone Density    Routine general medical examination at a health care facility        Relevant Orders    Basic metabolic panel    CBC with Differential/Platelet    Hepatic function panel    Lipid panel    TSH    Urinalysis, Routine w reflex microscopic (not at [facility])    Special screening for malignant neoplasms, colon        Relevant Orders    Ambulatory referral to Gastroenterology    Screening for breast cancer        Relevant Orders    MM DIGITAL SCREENING BILATERAL        Gwendolyn Grant, MD

## 2015-03-07 NOTE — Assessment & Plan Note (Signed)
Previously recommended Fosamax by prior PCP but declined to start same Inconsistent use of Ca/Vit D follow up DEXA q63yr, consider Prolia if needed

## 2015-03-07 NOTE — Progress Notes (Signed)
Pre visit review using our clinic review tool, if applicable. No additional management support is needed unless otherwise documented below in the visit note. 

## 2015-03-07 NOTE — Assessment & Plan Note (Signed)
Prev on crestor but cost prohibitive - tried atorva>> myalgia began low dose prava 11/12 as pt prefers to be on some statin despite good HDL- Reduced to 10mg  04/2012 Reviewed excellent HDL ratio The patient is asked to make an attempt to improve diet and exercise patterns to aid in medical management of this problem.

## 2015-03-08 ENCOUNTER — Encounter: Payer: Self-pay | Admitting: Internal Medicine

## 2015-03-21 NOTE — Progress Notes (Signed)
Need to verify coverage of rx with pt insurance.

## 2015-03-27 ENCOUNTER — Telehealth: Payer: Self-pay | Admitting: Internal Medicine

## 2015-03-27 NOTE — Telephone Encounter (Signed)
Patient returned your call from a couple weeks ago

## 2015-03-27 NOTE — Telephone Encounter (Signed)
Lab result letter sent.

## 2015-04-06 ENCOUNTER — Other Ambulatory Visit: Payer: Self-pay | Admitting: Internal Medicine

## 2015-04-12 ENCOUNTER — Telehealth: Payer: Self-pay

## 2015-04-12 ENCOUNTER — Encounter: Payer: Self-pay | Admitting: Internal Medicine

## 2015-04-12 NOTE — Telephone Encounter (Signed)
Spoke to pt husband. Informed of the reason for my (also contained in letter), confirmed address and apologized for any tardiness that was preserved.

## 2015-04-12 NOTE — Telephone Encounter (Signed)
Can you verify insurance coverage for Prolia?

## 2015-04-16 ENCOUNTER — Other Ambulatory Visit: Payer: Self-pay | Admitting: Internal Medicine

## 2015-04-16 NOTE — Telephone Encounter (Signed)
Voltaren #[phone] Xanax # 30 No refills

## 2015-04-17 NOTE — Telephone Encounter (Signed)
I have electronically submitted pt's info for Prolia insurance verification and will notify you once I have a response. Thank you. °

## 2015-05-06 NOTE — Telephone Encounter (Signed)
Sent pt an email stating the same and to let us know if she would like to start to the prolia injections.

## 2015-05-06 NOTE — Telephone Encounter (Signed)
I have rec'd pt's insurance verification for Prolia.  Admin, Prolia, and OV, if billed are subject to $2200 deductible (pt has met $359.71), which means pt will be responsible for the entire contracted amt [employer] has agreed to and I have no way of knowing what the contracted rate it.  Pt will need to contact her plan to find out.  I have sent a copy of the summary of benefits to be scanned into her chart.  If you have any questions, please let me know. Thank you.

## 2015-05-09 ENCOUNTER — Other Ambulatory Visit: Payer: Self-pay | Admitting: Obstetrics & Gynecology

## 2015-05-09 ENCOUNTER — Ambulatory Visit (AMBULATORY_SURGERY_CENTER): Payer: Self-pay

## 2015-05-09 VITALS — Ht 60.0 in | Wt 136.0 lb

## 2015-05-09 DIAGNOSIS — Z1211 Encounter for screening for malignant neoplasm of colon: Secondary | ICD-10-CM

## 2015-05-09 NOTE — Progress Notes (Signed)
No allergies to eggs or soy No diet/weight loss meds No past problems with anesthesia No home oxygen  Has had colon before; refused emmi

## 2015-05-10 LAB — CYTOLOGY - PAP

## 2015-05-15 ENCOUNTER — Other Ambulatory Visit (INDEPENDENT_AMBULATORY_CARE_PROVIDER_SITE_OTHER): Payer: BLUE CROSS/BLUE SHIELD

## 2015-05-15 ENCOUNTER — Ambulatory Visit (INDEPENDENT_AMBULATORY_CARE_PROVIDER_SITE_OTHER): Payer: BLUE CROSS/BLUE SHIELD | Admitting: Internal Medicine

## 2015-05-15 ENCOUNTER — Encounter: Payer: Self-pay | Admitting: Internal Medicine

## 2015-05-15 ENCOUNTER — Ambulatory Visit (INDEPENDENT_AMBULATORY_CARE_PROVIDER_SITE_OTHER)
Admission: RE | Admit: 2015-05-15 | Discharge: 2015-05-15 | Disposition: A | Discharge status: Home / Self Care | Payer: BLUE CROSS/BLUE SHIELD | Source: Ambulatory Visit | Attending: Internal Medicine | Admitting: Internal Medicine

## 2015-05-15 VITALS — BP 138/92 | HR 78 | Temp 98.5°F | Resp 16 | Wt 134.0 lb

## 2015-05-15 DIAGNOSIS — M810 Age-related osteoporosis without current pathological fracture: Secondary | ICD-10-CM

## 2015-05-15 DIAGNOSIS — M79641 Pain in right hand: Secondary | ICD-10-CM

## 2015-05-15 LAB — VITAMIN D 25 HYDROXY (VIT D DEFICIENCY, FRACTURES): VITD: 42.25 ng/mL (ref 30.00–100.00)

## 2015-05-15 NOTE — Progress Notes (Signed)
   Subjective:    Patient ID: [name], female    DOB: 1951/08/28, 64 y.o.   MRN: [id number]  HPI   She closed the house door 9/4 on her right hand when she was rushing . There initially was marked swelling and she could not close her hand. She applied ice and also took aspirin initially. Subsequently she's taken Advil which helps the discomfort.  She continues to have some aching over the dorsum of the hand and into the palm.  Significant  history includes osteoporosis. Her most recent bone density revealed a T score of -2.6 at the femoral neck. She had taken Fosamax remotely but had GI intolerance.Prolia is to be pursued. No vitamin D level is on record. Her calcium level was 9.9 on 03/07/15.    Review of Systems  She denies any fever, chills, sweats  There's no numbness, tingling, or weakness in the hand.      Objective:   Physical Exam Pertinent or positive findings include: There is ecchymosis of the dorsum of the right hand especially at the base of the index finger the third finger. She is able to make a fist. There is localized swelling at the base of the index and third fingers. Grip is good. She has normal radial pulses. She has no epitrochlear, axillary, cervical lymphadenopathy.   General appearance :adequately nourished; in no distress.  Eyes: No conjunctival inflammation or scleral icterus is present.  Heart:  Normal rate and regular rhythm. S1 and S2 normal without gallop, murmur, click, rub or other extra sounds    Lungs:Chest clear to auscultation; no wheezes, rhonchi,rales ,or rubs present.No increased work of breathing.   Skin:Warm & dry.  Intact without suspicious lesions or rashes ; no tenting or jaundice    Neuro: Strength, tone  normal.      Assessment & Plan:  #1 hand pain post trauma #2 osteoporosis See vorders

## 2015-05-15 NOTE — Patient Instructions (Signed)
  Your next office appointment will be determined based upon review of your pending labs  and  xrays  Those written interpretation of the lab results and instructions will be transmitted to you by My Chart   Critical results will be called.   Followup as needed for any active or acute issue. Please report any significant change in your symptoms. 

## 2015-05-15 NOTE — Progress Notes (Signed)
Pre visit review using our clinic review tool, if applicable. No additional management support is needed unless otherwise documented below in the visit note. 

## 2015-05-23 ENCOUNTER — Encounter: Payer: Self-pay | Admitting: Internal Medicine

## 2015-05-23 ENCOUNTER — Ambulatory Visit (AMBULATORY_SURGERY_CENTER): Payer: BLUE CROSS/BLUE SHIELD | Admitting: Internal Medicine

## 2015-05-23 VITALS — BP 135/72 | HR 61 | Temp 98.5°F | Resp 58 | Ht 60.0 in | Wt 136.0 lb

## 2015-05-23 DIAGNOSIS — Z1211 Encounter for screening for malignant neoplasm of colon: Secondary | ICD-10-CM

## 2015-05-23 DIAGNOSIS — K9041 Non-celiac gluten sensitivity: Secondary | ICD-10-CM | POA: Insufficient documentation

## 2015-05-23 MED ORDER — SODIUM CHLORIDE 0.9 % IV SOLN: 500.0000 mL | INTRAVENOUS | Status: DC

## 2015-05-23 NOTE — Progress Notes (Signed)
Report to PACU, RN, vss, BBS= Clear.  

## 2015-05-23 NOTE — Patient Instructions (Addendum)
No polyps! You do have diverticulosis - thickened muscle rings and pouches in the colon wall. Please read the handout about this condition.  Next routine colonoscopy/screening test in 10 years - 2026  I appreciate the opportunity to care for you. Gatha Mayer, MD, [facility]  Discharge instructions given. Handout on diverticulosis. Resume previous medications. YOU HAD AN ENDOSCOPIC PROCEDURE TODAY AT [facility]:   Refer to the procedure report that was given to you for any specific questions about what was found during the examination.  If the procedure report does not answer your questions, please call your gastroenterologist to clarify.  If you requested that your care partner not be given the details of your procedure findings, then the procedure report has been included in a sealed envelope for you to review at your convenience later.  YOU SHOULD EXPECT: Some feelings of bloating in the abdomen. Passage of more gas than usual.  Walking can help get rid of the air that was put into your GI tract during the procedure and reduce the bloating. If you had a lower endoscopy (such as a colonoscopy or flexible sigmoidoscopy) you may notice spotting of blood in your stool or on the toilet paper. If you underwent a bowel prep for your procedure, you may not have a normal bowel movement for a few days.  Please Note:  You might notice some irritation and congestion in your nose or some drainage.  This is from the oxygen used during your procedure.  There is no need for concern and it should clear up in a day or so.  SYMPTOMS TO REPORT IMMEDIATELY:   Following lower endoscopy (colonoscopy or flexible sigmoidoscopy):  Excessive amounts of blood in the stool  Significant tenderness or worsening of abdominal pains  Swelling of the abdomen that is new, acute  Fever of 100F or higher   For urgent or emergent issues, a gastroenterologist can be reached at any hour by calling  [phone].   DIET: Your first meal following the procedure should be a small meal and then it is ok to progress to your normal diet. Heavy or fried foods are harder to digest and may make you feel nauseous or bloated.  Likewise, meals heavy in dairy and vegetables can increase bloating.  Drink plenty of fluids but you should avoid alcoholic beverages for 24 hours.  ACTIVITY:  You should plan to take it easy for the rest of today and you should NOT DRIVE or use heavy machinery until tomorrow (because of the sedation medicines used during the test).    FOLLOW UP: Our staff will call the number listed on your records the next business day following your procedure to check on you and address any questions or concerns that you may have regarding the information given to you following your procedure. If we do not reach you, we will leave a message.  However, if you are feeling well and you are not experiencing any problems, there is no need to return our call.  We will assume that you have returned to your regular daily activities without incident.  If any biopsies were taken you will be contacted by phone or by letter within the next 1-3 weeks.  Please call us at [phone] if you have not heard about the biopsies in 3 weeks.    SIGNATURES/CONFIDENTIALITY: You and/or your care partner have signed paperwork which will be entered into your electronic medical record.  These signatures attest to the  fact that that the information above on your After Visit Summary has been reviewed and is understood.  Full responsibility of the confidentiality of this discharge information lies with you and/or your care-partner.

## 2015-05-23 NOTE — Op Note (Signed)
[place]  [address] [place], [address]   COLONOSCOPY PROCEDURE REPORT  PATIENT: [name], [name]  MR#: [id number] BIRTHDATE: [birth year] , 78  yrs. old GENDER: female ENDOSCOPIST: Gatha Mayer, MD, [facility] PROCEDURE DATE:  05/23/2015 PROCEDURE:   Colonoscopy, screening First Screening Colonoscopy - Avg.  risk and is 50 yrs.  old or older - No.  Prior Negative Screening - Now for repeat screening. 10 or more years since last screening  History of Adenoma - Now for follow-up colonoscopy & has been > or = to 3 yrs.  N/A  Polyps removed today? No Recommend repeat exam, <10 yrs? No ASA CLASS:   Class II INDICATIONS:Screening for colonic neoplasia and Colorectal Neoplasm Risk Assessment for this procedure is average risk. MEDICATIONS: Propofol 200 mg IV and Monitored anesthesia care  DESCRIPTION OF PROCEDURE:   After the risks benefits and alternatives of the procedure were thoroughly explained, informed consent was obtained.  The digital rectal exam revealed no abnormalities of the rectum.   The LB [id number]  endoscope was introduced through the anus and advanced to the cecum, which was identified by both the appendix and ileocecal valve. No adverse events experienced.   The quality of the prep was excellent. (MiraLax was used)  The instrument was then slowly withdrawn as the colon was fully examined. Estimated blood loss is zero unless otherwise noted in this procedure report.      COLON FINDINGS: There was mild diverticulosis noted in the sigmoid colon.   The examination was otherwise normal.   Right colon retroflexion included.  Retroflexed views revealed no abnormalities. The time to cecum = 3.0 Withdrawal time = 8.1   The scope was withdrawn and the procedure completed. COMPLICATIONS: There were no immediate complications.  ENDOSCOPIC IMPRESSION: 1.   Mild diverticulosis was noted in the sigmoid colon 2.   The examination was otherwise normal -  excellent prep second screening  RECOMMENDATIONS: Repeat colonoscopy/screening test 10 years 2026  eSigned:  Gatha Mayer, MD, [facility] 05/23/2015 9:36 AM   cc:

## 2015-05-24 ENCOUNTER — Other Ambulatory Visit: Payer: Self-pay | Admitting: Internal Medicine

## 2015-05-24 ENCOUNTER — Telehealth: Payer: Self-pay | Admitting: *Deleted

## 2015-05-24 NOTE — Telephone Encounter (Signed)
  Follow up Call-  Call back number 05/23/2015  Post procedure Call Back phone  # [phone]  Permission to leave phone message Yes     Patient questions:  Do you have a fever, pain , or abdominal swelling? No. Pain Score  0 *  Have you tolerated food without any problems? Yes.    Have you been able to return to your normal activities? Yes.    Do you have any questions about your discharge instructions: Diet   No. Medications  No. Follow up visit  No.  Do you have questions or concerns about your Care? No.  Actions: * If pain score is 4 or above: No action needed, pain <4.

## 2015-05-24 NOTE — Telephone Encounter (Signed)
OK X1 

## 2015-05-24 NOTE — Telephone Encounter (Signed)
Last OV 05/15/15. Last refill 04/18/15. Please advise

## 2015-05-25 ENCOUNTER — Encounter: Payer: Self-pay | Admitting: Internal Medicine

## 2015-05-27 ENCOUNTER — Other Ambulatory Visit: Payer: Self-pay | Admitting: Emergency Medicine

## 2015-05-27 MED ORDER — DICLOFENAC SODIUM 75 MG PO TBEC: 75.0000 mg | DELAYED_RELEASE_TABLET | Freq: Two times a day (BID) | ORAL | Status: DC

## 2015-07-08 ENCOUNTER — Other Ambulatory Visit: Payer: Self-pay | Admitting: Internal Medicine

## 2015-07-09 ENCOUNTER — Encounter: Payer: Self-pay | Admitting: Internal Medicine

## 2015-07-15 ENCOUNTER — Encounter: Payer: Self-pay | Admitting: Internal Medicine

## 2015-07-15 MED ORDER — NITROFURANTOIN MONOHYD MACRO 100 MG PO CAPS: 100.0000 mg | ORAL_CAPSULE | Freq: Two times a day (BID) | ORAL | Status: DC

## 2015-07-15 NOTE — Telephone Encounter (Signed)
abx refill done Ok to reassign to [facility] or [facility] so much!

## 2015-07-24 ENCOUNTER — Other Ambulatory Visit: Payer: Self-pay | Admitting: Internal Medicine

## 2015-07-25 NOTE — Telephone Encounter (Signed)
Faxed script back to [facility].../lmb 

## 2015-08-15 ENCOUNTER — Other Ambulatory Visit: Payer: Self-pay | Admitting: Internal Medicine

## 2015-09-16 ENCOUNTER — Other Ambulatory Visit: Payer: Self-pay | Admitting: Internal Medicine

## 2015-09-17 NOTE — Telephone Encounter (Signed)
[place] - prescription sent to pharmacy

## 2015-09-17 NOTE — Telephone Encounter (Signed)
Patient has scheduled  jan 18th appt to get established with dr burns---dr Asa Lente previously prescribed macrobid to prevent bladder infections and patient is requesting refill on macrobid---routing to dr burns to ask if it is ok to refill macrobid with 30 day supply until patient can be seen by dr burns on jan 18th---please advise, thanks

## 2015-09-25 ENCOUNTER — Ambulatory Visit: Payer: BLUE CROSS/BLUE SHIELD | Admitting: Internal Medicine

## 2015-11-08 ENCOUNTER — Ambulatory Visit (INDEPENDENT_AMBULATORY_CARE_PROVIDER_SITE_OTHER): Payer: BLUE CROSS/BLUE SHIELD | Admitting: Internal Medicine

## 2015-11-08 ENCOUNTER — Encounter: Payer: Self-pay | Admitting: Internal Medicine

## 2015-11-08 VITALS — BP 174/102 | HR 71 | Temp 98.1°F | Resp 16 | Wt 132.0 lb

## 2015-11-08 DIAGNOSIS — F411 Generalized anxiety disorder: Secondary | ICD-10-CM

## 2015-11-08 DIAGNOSIS — Z23 Encounter for immunization: Secondary | ICD-10-CM | POA: Diagnosis not present

## 2015-11-08 DIAGNOSIS — Z139 Encounter for screening, unspecified: Secondary | ICD-10-CM

## 2015-11-08 DIAGNOSIS — N390 Urinary tract infection, site not specified: Secondary | ICD-10-CM | POA: Diagnosis not present

## 2015-11-08 DIAGNOSIS — D510 Vitamin B12 deficiency anemia due to intrinsic factor deficiency: Secondary | ICD-10-CM

## 2015-11-08 DIAGNOSIS — I10 Essential (primary) hypertension: Secondary | ICD-10-CM

## 2015-11-08 DIAGNOSIS — E785 Hyperlipidemia, unspecified: Secondary | ICD-10-CM

## 2015-11-08 DIAGNOSIS — M810 Age-related osteoporosis without current pathological fracture: Secondary | ICD-10-CM

## 2015-11-08 MED ORDER — NITROFURANTOIN MONOHYD MACRO 100 MG PO CAPS: 100.0000 mg | ORAL_CAPSULE | Freq: Every day | ORAL | Status: DC

## 2015-11-08 NOTE — Assessment & Plan Note (Signed)
Has had recurrent UTI off of preventive abx treatment Will continue macrobid daily

## 2015-11-08 NOTE — Assessment & Plan Note (Signed)
On pravastatin 10 mg daily Check lipid panel Stressed regular exercise

## 2015-11-08 NOTE — Assessment & Plan Note (Signed)
dexa up to date Stressed regular exercise Not taking vitamin D  - advised her to start taking it Discussed calcium though diet or supplements

## 2015-11-08 NOTE — Progress Notes (Signed)
Pre visit review using our clinic review tool, if applicable. No additional management support is needed unless otherwise documented below in the visit note. 

## 2015-11-08 NOTE — Assessment & Plan Note (Signed)
Not current on B12 supplements or injections Check B12 level

## 2015-11-08 NOTE — Patient Instructions (Addendum)
  We have reviewed your prior records including labs and tests today.  Test(s) ordered today. Your results will be released to Stephen (or called to you) after review, usually within 72hours after test completion. If any changes need to be made, you will be notified at that same time.  All other Health Maintenance issues reviewed.   All recommended immunizations and age-appropriate screenings are up-to-date.  No immunizations administered today.   Medications reviewed and updated. No changes recommended at this time.  Start taking vitamin D.  Start regular exercise  Start monitoring your BP regularly.    Please schedule followup in 4 weeks

## 2015-11-08 NOTE — Assessment & Plan Note (Signed)
BP initially elevated, but improved ? Controlled Low sodium diet Stressed regular exercise Start monitoring BP at home Check cmp

## 2015-11-08 NOTE — Assessment & Plan Note (Signed)
Uses xanax only as needed for anxiety or sleep Stressed keeping it to a minimum - avoid  Taking it regularly - due to concerns with addiction or memory Continue xanax as needed

## 2015-11-08 NOTE — Progress Notes (Signed)
Subjective:    Patient ID: [name], female    DOB: 19-Jun-1951, 65 y.o.   MRN: [id number]  HPI She is here to establish with a new pcp.   UTIs:  She has always been prone to getting UTI's in the past.  She had ? Bladder surgery as a child because of reflux.  When she has gone off of the medication in the past she has gotten a UTI.  She takes one macrobid at night to prevent UTI's.  She would like to continue the current medication.  Hyperlipidemia: She is taking her medication daily. She is compliant with a low fat/cholesterol diet. She is not exercising regularly. She denies myalgias.   Hypertension: She is taking her medication daily. She is compliant with a low sodium diet.  She denies chest pain, palpitations, shortness of breath and regular headaches. She is not exercising regularly.  She does not monitor her blood pressure at home.    Joint pain:  She takes diclofenac or advil once a day sometimes twice a day.  This is for generalized joint pain and arthritis.   Anxiety, insomnia: She uses xanax as needed for either anxiety or difficulty sleeping.  She does not take it regularly.    Medications and allergies reviewed with patient and updated if appropriate.  Patient Active Problem List   Diagnosis Date Noted  . Non-celiac gluten sensitivity   . Urinary frequency 01/10/2015  . Chest pain 05/18/2012  . Anxiety state 01/17/2010  . ALLERGIC RHINITIS 01/17/2010  . Dyslipidemia 07/12/2009  . Essential hypertension 07/12/2009  . Osteoporosis 06/22/2008  . SELECTIVE IGA IMMUNODEFICIENCY 10/14/2007  . ANEMIA, PERNICIOUS 10/14/2007  . IRRITABLE BOWEL SYNDROME 10/14/2007    Current Outpatient Prescriptions on File Prior to Visit  Medication Sig Dispense Refill  . ALPRAZolam (XANAX) 0.25 MG tablet Take 1 tablet (0.25 mg total) by mouth at bedtime as needed for anxiety. 30 tablet 3  . amLODipine (NORVASC) 10 MG tablet Take 1 tablet (10 mg total) by mouth daily. 90 tablet 3  .  diclofenac (VOLTAREN) 75 MG EC tablet TAKE 1 TABLET (75 MG TOTAL) BY MOUTH 2 (TWO) TIMES DAILY. 60 tablet 2  . metaxalone (SKELAXIN) 800 MG tablet Take 1 tablet (800 mg total) by mouth every 8 (eight) hours as needed for muscle spasms. 60 tablet 3  . nitrofurantoin, macrocrystal-monohydrate, (MACROBID) 100 MG capsule TAKE 1 CAPSULE (100 MG TOTAL) BY MOUTH 2 (TWO) TIMES DAILY. 30 capsule 1  . pravastatin (PRAVACHOL) 10 MG tablet Take 1 tablet (10 mg total) by mouth every evening. 90 tablet 3   No current facility-administered medications on file prior to visit.    Past Medical History  Diagnosis Date  . ALLERGIC RHINITIS   . ANEMIA, PERNICIOUS   . ANXIETY   . DYSLIPIDEMIA   . HYPERTENSION   . CARPAL TUNNEL SYNDROME, LEFT   . OSTEOPENIA   . 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HCC)     borderline  . Irritable bowel syndrome   . BILIARY DYSKINESIA   . Headache(784.0)   . Non-celiac gluten sensitivity     Past Surgical History  Procedure Laterality Date  . Cholecystectomy  2005    Dr. Dalbert Batman (Gallbladder dyskinesia)  . Cesarean section  76 & 85    x's 2   . Tonsillectomy    . Dilation and curettage of uterus    . Carpal tunnel release    . Rotator cuff repair    . Appendectomy    .  Left heart catheterization with coronary angiogram N/A 05/19/2012    Procedure: LEFT HEART CATHETERIZATION WITH CORONARY ANGIOGRAM;  Surgeon: Hillary Bow, MD;  Location: [facility] CATH LAB;  Service: Cardiovascular;  Laterality: N/A;  . Colonoscopy    . Upper gastrointestinal endoscopy      Social History   Social History  . Marital Status: Married    Spouse Name: N/A  . Number of Children: N/A  . Years of Education: N/A   Occupational History  . Dental office    Social History Main Topics  . Smoking status: Never Smoker   . Smokeless tobacco: Never Used  . Alcohol Use: 0.6 oz/week    1 Glasses of wine per week     Comment: occassional  . Drug Use: No  . Sexual Activity: Yes    Birth  Control/ Protection: Post-menopausal   Other Topics Concern  . 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    Dental hygienist    Family History  Problem Relation Age of Onset  . Colitis Mother   . Heart disease Mother   . Hyperlipidemia Mother   . Lung cancer Mother     Cause of death  . Hypertension Mother   . Lung cancer Father   . Leukemia Brother   . Coronary artery disease Brother     MI in late 72's. Still living  . Ovarian cancer Maternal Aunt   . Diabetes Maternal Aunt   . Coronary artery disease Brother     MI in 41's. Also still living.  . Colon cancer Neg Hx   . Colon polyps Neg Hx     Review of Systems  Constitutional: Negative for fever and chills.  Respiratory: Negative for cough, shortness of breath and wheezing.   Cardiovascular: Positive for leg swelling (for past 6-7 months). Negative for chest pain and palpitations.  Genitourinary: Negative for dysuria, frequency and hematuria.  Musculoskeletal: Negative for myalgias.  Neurological: Positive for headaches. Negative for dizziness and light-headedness.       Objective:   Filed Vitals:   11/08/15 1349  BP: 174/102  Pulse: 71  Temp: 98.1 F (36.7 C)  Resp: 16   Filed Weights   11/08/15 1349  Weight: 132 lb (59.875 kg)   Body mass index is 25.78 kg/(m^2).   Physical Exam Constitutional: Appears well-developed and well-nourished. No distress.  Neck: Neck supple. No tracheal deviation present. No thyromegaly present.  No carotid bruit. No cervical adenopathy.   Cardiovascular: Normal rate, regular rhythm and normal heart sounds.   1/6 systolic murmur.  No edema Pulmonary/Chest: Effort normal and breath sounds normal. No respiratory distress. No wheezes.  Psych: normal mood and affect       Assessment & Plan:   See Problem List for Assessment and Plan of chronic medical problems.  F/u 4 weeks to recheck bp

## 2015-11-15 ENCOUNTER — Other Ambulatory Visit: Payer: Self-pay | Admitting: Internal Medicine

## 2015-11-22 ENCOUNTER — Other Ambulatory Visit (INDEPENDENT_AMBULATORY_CARE_PROVIDER_SITE_OTHER): Payer: BLUE CROSS/BLUE SHIELD

## 2015-11-22 DIAGNOSIS — I10 Essential (primary) hypertension: Secondary | ICD-10-CM | POA: Diagnosis not present

## 2015-11-22 DIAGNOSIS — M810 Age-related osteoporosis without current pathological fracture: Secondary | ICD-10-CM

## 2015-11-22 DIAGNOSIS — D510 Vitamin B12 deficiency anemia due to intrinsic factor deficiency: Secondary | ICD-10-CM | POA: Diagnosis not present

## 2015-11-22 DIAGNOSIS — E785 Hyperlipidemia, unspecified: Secondary | ICD-10-CM

## 2015-11-22 DIAGNOSIS — Z139 Encounter for screening, unspecified: Secondary | ICD-10-CM

## 2015-11-22 LAB — LIPID PANEL
CHOL/HDL RATIO: 4
Cholesterol: 261 mg/dL — ABNORMAL HIGH (ref 0–200)
HDL: 71.1 mg/dL (ref >39.00)
LDL Cholesterol: 163 mg/dL — ABNORMAL HIGH (ref 0–99)
NONHDL: 190.05
TRIGLYCERIDES: 135 mg/dL (ref 0.0–149.0)
VLDL: 27 mg/dL (ref 0.0–40.0)

## 2015-11-22 LAB — COMPREHENSIVE METABOLIC PANEL
ALK PHOS: 66 U/L (ref 39–117)
ALT: 24 U/L (ref 0–35)
AST: 20 U/L (ref 0–37)
Albumin: 4.8 g/dL (ref 3.5–5.2)
BILIRUBIN TOTAL: 0.8 mg/dL (ref 0.2–1.2)
BUN: 9 mg/dL (ref 6–23)
CO2: 29 mEq/L (ref 19–32)
CREATININE: 0.78 mg/dL (ref 0.40–1.20)
Calcium: 9.7 mg/dL (ref 8.4–10.5)
Chloride: 105 mEq/L (ref 96–112)
GFR: 79.37 mL/min (ref >60.00)
GLUCOSE: 99 mg/dL (ref 70–99)
Potassium: 4.1 mEq/L (ref 3.5–5.1)
Sodium: 143 mEq/L (ref 135–145)
TOTAL PROTEIN: 6.8 g/dL (ref 6.0–8.3)

## 2015-11-22 LAB — CBC WITH DIFFERENTIAL/PLATELET
BASOS ABS: 0 10*3/uL (ref 0.0–0.1)
BASOS PCT: 0.7 % (ref 0.0–3.0)
EOS PCT: 4 % (ref 0.0–5.0)
Eosinophils Absolute: 0.2 10*3/uL (ref 0.0–0.7)
HEMATOCRIT: 41.4 % (ref 36.0–46.0)
Hemoglobin: 14.2 g/dL (ref 12.0–15.0)
LYMPHS ABS: 1.5 10*3/uL (ref 0.7–4.0)
Lymphocytes Relative: 35.4 % (ref 12.0–46.0)
MCHC: 34.3 g/dL (ref 30.0–36.0)
MCV: 88.4 fl (ref 78.0–100.0)
MONO ABS: 0.3 10*3/uL (ref 0.1–1.0)
Monocytes Relative: 7.5 % (ref 3.0–12.0)
NEUTROS PCT: 52.4 % (ref 43.0–77.0)
Neutro Abs: 2.2 10*3/uL (ref 1.4–7.7)
Platelets: 185 10*3/uL (ref 150.0–400.0)
RBC: 4.68 Mil/uL (ref 3.87–5.11)
RDW: 13.6 % (ref 11.5–15.5)
WBC: 4.3 10*3/uL (ref 4.0–10.5)

## 2015-11-22 LAB — VITAMIN B12: VITAMIN B 12: 323 pg/mL (ref 211–911)

## 2015-11-22 LAB — TSH: TSH: 3.27 u[IU]/mL (ref 0.35–4.50)

## 2015-11-22 LAB — HEMOGLOBIN A1C: Hgb A1c MFr Bld: 5.6 % (ref 4.6–6.5)

## 2015-11-23 LAB — HEPATITIS C ANTIBODY: HCV AB: NEGATIVE

## 2015-11-24 ENCOUNTER — Encounter: Payer: Self-pay | Admitting: Internal Medicine

## 2015-11-25 LAB — VITAMIN D 1,25 DIHYDROXY
VITAMIN D 1, 25 (OH) TOTAL: 29 pg/mL (ref 18–72)
VITAMIN D3 1, 25 (OH): 29 pg/mL
Vitamin D2 1, 25 (OH)2: <8 pg/mL

## 2015-11-26 ENCOUNTER — Encounter: Payer: Self-pay | Admitting: Internal Medicine

## 2015-12-06 ENCOUNTER — Ambulatory Visit (INDEPENDENT_AMBULATORY_CARE_PROVIDER_SITE_OTHER): Payer: BLUE CROSS/BLUE SHIELD | Admitting: Internal Medicine

## 2015-12-06 ENCOUNTER — Encounter: Payer: Self-pay | Admitting: Internal Medicine

## 2015-12-06 VITALS — BP 138/70 | HR 77 | Temp 98.7°F | Resp 16 | Wt 132.0 lb

## 2015-12-06 DIAGNOSIS — E785 Hyperlipidemia, unspecified: Secondary | ICD-10-CM | POA: Diagnosis not present

## 2015-12-06 DIAGNOSIS — I10 Essential (primary) hypertension: Secondary | ICD-10-CM

## 2015-12-06 MED ORDER — NITROFURANTOIN MONOHYD MACRO 100 MG PO CAPS
100.0000 mg | ORAL_CAPSULE | Freq: Two times a day (BID) | ORAL | Status: DC
Start: 2015-12-06 — End: 2017-01-10

## 2015-12-06 MED ORDER — PRAVASTATIN SODIUM 20 MG PO TABS: 20.0000 mg | ORAL_TABLET | Freq: Every day | ORAL | Status: DC

## 2015-12-06 MED ORDER — NITROFURANTOIN MONOHYD MACRO 100 MG PO CAPS
100.0000 mg | ORAL_CAPSULE | Freq: Two times a day (BID) | ORAL | Status: DC
Start: 2015-12-06 — End: 2015-12-06

## 2015-12-06 NOTE — Assessment & Plan Note (Signed)
BP seems controlled, but on higher side No change in meds - continue amlodipine 10 mg daily Work on increasing exercise and weight loss Compliant with low sodium diet

## 2015-12-06 NOTE — Assessment & Plan Note (Addendum)
Recent lipid panel with elevated LDL - not ideally controlled Work on diet, increasing exercise and weight loss Increase pravastatin to 20 mg daily - has not had muscle pain in the past and has not been on higher dose

## 2015-12-06 NOTE — Patient Instructions (Addendum)
  Test(s) ordered today. Your results will be released to [place] (or called to you) after review, usually within 72hours after test completion. If any changes need to be made, you will be notified at that same time.   Medications reviewed and updated.  Changes include increasing the pravastatin to 20 mg daily.    Your prescription(s) have been submitted to your pharmacy. Please take as directed and contact our office if you believe you are having problem(s) with the medication(s).  Please followup in 6 months

## 2015-12-06 NOTE — Progress Notes (Signed)
Pre visit review using our clinic review tool, if applicable. No additional management support is needed unless otherwise documented below in the visit note. 

## 2015-12-06 NOTE — Progress Notes (Signed)
Subjective:    Patient ID: [name], female    DOB: May 13, 1951, 65 y.o.   MRN: [id number]  HPI She is here for follow up of her blood pressure.  We were unsure at her last visit if her BP was controlled.   Hypertension: She is taking her medication daily. She is compliant with a low sodium diet.  She denies chest pain, palpitations, edema, shortness of breath and regular headaches. She is exercising irregularly - she walks about three days a week - she is trying to be more consistent with walking.  She does not monitor her blood pressure at home, but checked it a couple of times at work 140/78, 140/72.    Hyperlipidemia: She is taking her medication daily. She is compliant with a low fat/cholesterol diet. She is exercising regularly, but knows she needs to do more. She denies myalgias.     Medications and allergies reviewed with patient and updated if appropriate.  Patient Active Problem List   Diagnosis Date Noted  . Recurrent UTI 11/08/2015  . Non-celiac gluten sensitivity   . Urinary frequency 01/10/2015  . Chest pain 05/18/2012  . Anxiety state 01/17/2010  . ALLERGIC RHINITIS 01/17/2010  . Dyslipidemia 07/12/2009  . Essential hypertension 07/12/2009  . Osteoporosis 06/22/2008  . SELECTIVE IGA IMMUNODEFICIENCY 10/14/2007  . ANEMIA, PERNICIOUS 10/14/2007  . IRRITABLE BOWEL SYNDROME 10/14/2007    Current Outpatient Prescriptions on File Prior to Visit  Medication Sig Dispense Refill  . ALPRAZolam (XANAX) 0.25 MG tablet Take 1 tablet (0.25 mg total) by mouth at bedtime as needed for anxiety. 30 tablet 3  . amLODipine (NORVASC) 10 MG tablet Take 1 tablet (10 mg total) by mouth daily. 90 tablet 3  . diclofenac (VOLTAREN) 75 MG EC tablet TAKE 1 TABLET (75 MG TOTAL) BY MOUTH 2 (TWO) TIMES DAILY. 60 tablet 2  . nitrofurantoin, macrocrystal-monohydrate, (MACROBID) 100 MG capsule Take 1 capsule (100 mg total) by mouth daily. 30 capsule 5  . nitrofurantoin,  macrocrystal-monohydrate, (MACROBID) 100 MG capsule TAKE ONE CAPSULE BY MOUTH TWICE A DAY 30 capsule 5  . pravastatin (PRAVACHOL) 10 MG tablet Take 1 tablet (10 mg total) by mouth every evening. 90 tablet 3   No current facility-administered medications on file prior to visit.    Past Medical History  Diagnosis Date  . ALLERGIC RHINITIS   . ANEMIA, PERNICIOUS   . ANXIETY   . DYSLIPIDEMIA   . HYPERTENSION   . CARPAL TUNNEL SYNDROME, LEFT   . OSTEOPENIA   . 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HCC)     borderline  . Irritable bowel syndrome   . BILIARY DYSKINESIA   . Headache(784.0)   . Non-celiac gluten sensitivity     Past Surgical History  Procedure Laterality Date  . Cholecystectomy  2005    Dr. Dalbert Batman (Gallbladder dyskinesia)  . Cesarean section  76 & 85    x's 2   . Tonsillectomy    . Dilation and curettage of uterus    . Carpal tunnel release    . Rotator cuff repair    . Appendectomy    . Left heart catheterization with coronary angiogram N/A 05/19/2012    Procedure: LEFT HEART CATHETERIZATION WITH CORONARY ANGIOGRAM;  Surgeon: Hillary Bow, MD;  Location: [facility] CATH LAB;  Service: Cardiovascular;  Laterality: N/A;  . Colonoscopy    . Upper gastrointestinal endoscopy      Social History   Social History  . Marital Status: Married  Spouse Name: N/A  . Number of Children: N/A  . Years of Education: N/A   Occupational History  . Dental office    Social History Main Topics  . Smoking status: Never Smoker   . Smokeless tobacco: Never Used  . Alcohol Use: 0.6 oz/week    1 Glasses of wine per week     Comment: occassional  . Drug Use: No  . Sexual Activity: Yes    Birth Control/ Protection: Post-menopausal   Other Topics Concern  . 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 Colitis Mother   . Heart disease Mother   . Hyperlipidemia Mother   . Lung  cancer Mother     Cause of death  . Hypertension Mother   . Lung cancer Father   . Leukemia Brother   . Coronary artery disease Brother     MI in late 67's. Still living  . Ovarian cancer Maternal Aunt   . Diabetes Maternal Aunt   . Coronary artery disease Brother     MI in 55's. Also still living.  . Colon cancer Neg Hx   . Colon polyps Neg Hx     Review of Systems  Constitutional: Negative for fever.  Respiratory: Negative for cough, shortness of breath and wheezing.   Cardiovascular: Negative for chest pain, palpitations and leg swelling.  Musculoskeletal: Negative for myalgias.  Neurological: Positive for headaches (from neck). Negative for light-headedness.       Objective:   Filed Vitals:   12/06/15 0929  BP: 138/70  Pulse: 77  Temp: 98.7 F (37.1 C)  Resp: 16   Filed Weights   12/06/15 0929  Weight: 132 lb (59.875 kg)   Body mass index is 25.78 kg/(m^2).   Physical Exam Constitutional: Appears well-developed and well-nourished. No distress.  Neck: Neck supple. No tracheal deviation present. No thyromegaly present.  No carotid bruit. No cervical adenopathy.   Cardiovascular: Normal rate, regular rhythm and normal heart sounds.   No murmur heard.  No edema Pulmonary/Chest: Effort normal and breath sounds normal. No respiratory distress. No wheezes.  Skin: hyperpigmentation in right lower leg - looks like from chronic venous stasis      Assessment & Plan:   Not taking vitamin D and B12 consistently -- will start taking  See Problem List for Assessment and Plan of chronic medical problems.  F/u in 6 months for a PE

## 2015-12-27 ENCOUNTER — Telehealth: Payer: Self-pay

## 2015-12-27 NOTE — Telephone Encounter (Signed)
Patient did not get prolia injection last year because of high deductible with her former insurance, patient is no longer with [employer] and [facility]----i will be getting with margaret/prolia on 4/27 to resubmit prolia request using patient's new insurance coverage---per patient she has given that information to our office when she came of march office visit with dr burns---ok to use insurance numbers from her current demographics

## 2016-01-04 ENCOUNTER — Other Ambulatory Visit: Payer: Self-pay | Admitting: Internal Medicine

## 2016-01-06 NOTE — Telephone Encounter (Signed)
Please advise, pts last OV 12/06/15. RX last filled by Dr Asa Lente

## 2016-03-09 ENCOUNTER — Encounter: Payer: BLUE CROSS/BLUE SHIELD | Admitting: Internal Medicine

## 2016-03-27 ENCOUNTER — Telehealth: Payer: Self-pay

## 2016-03-27 NOTE — Telephone Encounter (Signed)
Talked with patient's insurance co----her insurance would not release any benefit info to third party (prolia/amgen)---i have ref [id number] and estimated $10 copay for prolia injection---patient advised, patient wants to double check with her insurance co and then call back to schedule nurse visit to get prolia injection if cost is accurate----can talk with tamara if any questions

## 2016-03-29 ENCOUNTER — Other Ambulatory Visit: Payer: Self-pay | Admitting: Internal Medicine

## 2016-03-30 NOTE — Telephone Encounter (Signed)
Md ok alprazolam faxed script back to [facility].../lmb

## 2016-05-06 ENCOUNTER — Other Ambulatory Visit: Payer: Self-pay | Admitting: Internal Medicine

## 2016-05-15 ENCOUNTER — Telehealth: Payer: Self-pay

## 2016-05-15 NOTE — Telephone Encounter (Signed)
Called and left another message asking patient to call back about prolia injection decision---can talk with Johnelle Tafolla

## 2016-06-12 ENCOUNTER — Encounter: Payer: BLUE CROSS/BLUE SHIELD | Admitting: Internal Medicine

## 2016-06-20 ENCOUNTER — Other Ambulatory Visit: Payer: Self-pay | Admitting: Internal Medicine

## 2016-06-29 ENCOUNTER — Other Ambulatory Visit: Payer: Self-pay | Admitting: Internal Medicine

## 2016-08-06 NOTE — Progress Notes (Signed)
Subjective:    Patient ID: [name], female    DOB: 10-04-1950, 65 y.o.   MRN: [id number]  HPI She is here for a physical exam.    Hypertension: She is taking her medication daily. She is compliant with a low sodium diet.  She denies chest pain, edema, shortness of breath and regular headaches. She is exercising regularly - walks three times a week.   Hyperlipidemia: She is taking her medication daily. She is compliant with a low fat/cholesterol diet. She is exercising regularly.   Osteoporosis:  She walks for exercise 3/weeks.  She is taking her vitamin d daily, but is not as good with taking her calcium.  She was going to get prolia today, but would like to hold off.  She would like to avoid medication.   Anxiety: She is taking her medication as needed as prescribed - she takes it about 2/week. She denies any side effects from the medication. She feels her anxiety is well controlled and she is happy with her current dose of medication - she will try to keep it to a minimum.   Recurrent UTI:  Has been taking macrobid for prevention. She denies any concerning urinary symptoms.    Medications and allergies reviewed with patient and updated if appropriate.  Patient Active Problem List   Diagnosis Date Noted  . Recurrent UTI 11/08/2015  . Non-celiac gluten sensitivity   . Urinary frequency 01/10/2015  . Anxiety state 01/17/2010  . ALLERGIC RHINITIS 01/17/2010  . Dyslipidemia 07/12/2009  . Essential hypertension 07/12/2009  . Osteoporosis 06/22/2008  . SELECTIVE IGA IMMUNODEFICIENCY 10/14/2007  . ANEMIA, PERNICIOUS 10/14/2007  . IRRITABLE BOWEL SYNDROME 10/14/2007    Current Outpatient Prescriptions on File Prior to Visit  Medication Sig Dispense Refill  . ALPRAZolam (XANAX) 0.25 MG tablet TAKE 1 TAB BY MOUYH AT BEDTIMES AS NEEDED FOR ANXIETY 30 tablet 2  . amLODipine (NORVASC) 10 MG tablet TAKE 1 TABLET (10 MG TOTAL) BY MOUTH DAILY. 90 tablet 2  . diclofenac (VOLTAREN)  75 MG EC tablet TAKE 1 TABLET (75 MG TOTAL) BY MOUTH 2 (TWO) TIMES DAILY. 60 tablet 1  . metaxalone (SKELAXIN) 800 MG tablet TAKE 1 TABLET (800 MG TOTAL) BY MOUTH EVERY 8 (EIGHT) HOURS AS NEEDED FOR MUSCLE SPASMS. 60 tablet 0  . nitrofurantoin, macrocrystal-monohydrate, (MACROBID) 100 MG capsule Take 1 capsule (100 mg total) by mouth 2 (two) times daily. 60 capsule 11  . pravastatin (PRAVACHOL) 20 MG tablet Take 1 tablet (20 mg total) by mouth daily. 90 tablet 3   No current facility-administered medications on file prior to visit.     Past Medical History:  Diagnosis Date  . ALLERGIC RHINITIS   . ANEMIA, PERNICIOUS   . ANXIETY   . BILIARY DYSKINESIA   . CARPAL TUNNEL SYNDROME, LEFT   . DYSLIPIDEMIA   . Headache(784.0)   . HYPERTENSION   . Irritable bowel syndrome   . Non-celiac gluten sensitivity   . OSTEOPENIA   . 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[place])    borderline    Past Surgical History:  Procedure Laterality Date  . APPENDECTOMY    . CARPAL TUNNEL RELEASE    . CESAREAN SECTION  74 & 83   x's 2   . CHOLECYSTECTOMY  2005   Dr. Dalbert Batman (Gallbladder dyskinesia)  . COLONOSCOPY    . DILATION AND CURETTAGE OF UTERUS    . LEFT HEART CATHETERIZATION WITH CORONARY ANGIOGRAM N/A 05/19/2012   Procedure: LEFT HEART CATHETERIZATION WITH  CORONARY ANGIOGRAM;  Surgeon: Hillary Bow, MD;  Location: [facility] CATH LAB;  Service: Cardiovascular;  Laterality: N/A;  . ROTATOR CUFF REPAIR    . TONSILLECTOMY    . UPPER GASTROINTESTINAL ENDOSCOPY      Social History   Social History  . Marital status: Married    Spouse name: N/A  . Number of children: N/A  . Years of education: N/A   Occupational History  . Dental office [employer]   Social History Main Topics  . Smoking status: Never Smoker  . Smokeless tobacco: Never Used  . Alcohol use 0.6 oz/week    1 Glasses of wine per week     Comment: occassional  . Drug use: No  . Sexual activity: Yes    Birth control/ protection:  Post-menopausal   Other Topics Concern  . 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    Dental hygienist    Family History  Problem Relation Age of Onset  . Colitis Mother   . Heart disease Mother   . Hyperlipidemia Mother   . Lung cancer Mother     Cause of death  . Hypertension Mother   . Lung cancer Father   . Leukemia Brother   . Coronary artery disease Brother     MI in late 70's. Still living  . Ovarian cancer Maternal Aunt   . Diabetes Maternal Aunt   . Coronary artery disease Brother     MI in 77's. Also still living.  . Colon cancer Neg Hx   . Colon polyps Neg Hx     Review of Systems  Constitutional: Negative for chills and fever.  Eyes: Negative for visual disturbance.  Respiratory: Negative for cough, shortness of breath and wheezing.   Cardiovascular: Positive for palpitations (rare with anxiety, rare flutter). Negative for chest pain and leg swelling.  Gastrointestinal: Negative for abdominal pain, blood in stool, constipation, diarrhea and nausea.       Gerd occ  Endocrine: Positive for cold intolerance.  Genitourinary: Negative for dysuria and hematuria.  Musculoskeletal: Positive for arthralgias (daily achiness - takes nsaids). Negative for back pain.  Neurological: Negative for dizziness and headaches.  Psychiatric/Behavioral: Negative for dysphoric mood. The patient is nervous/anxious (work related - does not need medication).        Objective:   Vitals:   08/07/16 0835  BP: (!) 146/94  Pulse: 71  Resp: 16  Temp: 98.1 F (36.7 C)   Filed Weights   08/07/16 0835  Weight: 126 lb (57.2 kg)   Body mass index is 24.61 kg/m.   Physical Exam    Constitutional: She appears well-developed and well-nourished. No distress.  HENT:  Head: Normocephalic and atraumatic.  Right Ear: External ear normal. Normal ear canal and TM Left Ear: External ear normal.  Normal ear canal and TM Mouth/Throat: Oropharynx is clear and  moist.  Eyes: Conjunctivae and EOM are normal.  Neck: Neck supple. No tracheal deviation present. No thyromegaly present.  No carotid bruit  Cardiovascular: Normal rate, regular rhythm and normal heart sounds.   No murmur heard.  No edema. Pulmonary/Chest: Effort normal and breath sounds normal. No respiratory distress. She has no wheezes. She has no rales.  Breast: deferred to Gyn Abdominal: Soft. She exhibits no distension. There is no tenderness.  Lymphadenopathy: She has no cervical adenopathy.  Skin: Skin is warm and dry. She is not diaphoretic.  Psychiatric: She has a normal mood and affect.  Her behavior is normal.        Assessment & Plan:   Physical exam: Screening blood work  ordered Immunizations  flu vaccine today, discussed shingles vaccine Colonoscopy  Up to date  Mammogram - to be done in January Gyn   Up to date  Dexa   Up to date  Eye exams  Up to date  Exercise --  Walking 3 / week - encouraged increased exercise Weight - BMI normal Skin  - has seen derm for an acute issue- Dr Delman Cheadle - will have a skin check  Substance abuse - none  See Problem List for Assessment and Plan of chronic medical problems.   F/u in 6 months

## 2016-08-06 NOTE — Patient Instructions (Addendum)
Test(s) ordered today. Your results will be released to [place] (or called to you) after review, usually within 72hours after test completion. If any changes need to be made, you will be notified at that same time.  All other Health Maintenance issues reviewed.   All recommended immunizations and age-appropriate screenings are up-to-date or discussed.  Flu immunization administered today.   Medications reviewed and updated.  No changes recommended at this time.  Please followup in 6 months    Health Maintenance, Female Introduction Adopting a healthy lifestyle and getting preventive care can go a long way to promote health and wellness. Talk with your health care provider about what schedule of regular examinations is right for you. This is a good chance for you to check in with your provider about disease prevention and staying healthy. In between checkups, there are plenty of things you can do on your own. Experts have done a lot of research about which lifestyle changes and preventive measures are most likely to keep you healthy. Ask your health care provider for more information. Weight and diet Eat a healthy diet  Be sure to include plenty of vegetables, fruits, low-fat dairy products, and lean protein.  Do not eat a lot of foods high in solid fats, added sugars, or salt.  Get regular exercise. This is one of the most important things you can do for your health.  Most adults should exercise for at least 150 minutes each week. The exercise should increase your heart rate and make you sweat (moderate-intensity exercise).  Most adults should also do strengthening exercises at least twice a week. This is in addition to the moderate-intensity exercise. Maintain a healthy weight  Body mass index (BMI) is a measurement that can be used to identify possible weight problems. It estimates body fat based on height and weight. Your health care provider can help determine your BMI and help  you achieve or maintain a healthy weight.  For females 14 years of age and older:  A BMI below 18.5 is considered underweight.  A BMI of 18.5 to 24.9 is normal.  A BMI of 25 to 29.9 is considered overweight.  A BMI of 30 and above is considered obese. Watch levels of cholesterol and blood lipids  You should start having your blood tested for lipids and cholesterol at 63 years of age, then have this test every 5 years.  You may need to have your cholesterol levels checked more often if:  Your lipid or cholesterol levels are high.  You are older than 63 years of age.  You are at high risk for heart disease. Cancer screening Lung Cancer  Lung cancer screening is recommended for adults 57-22 years old who are at high risk for lung cancer because of a history of smoking.  A yearly low-dose CT scan of the lungs is recommended for people who:  Currently smoke.  Have quit within the past 15 years.  Have at least a 30-pack-year history of smoking. A pack year is smoking an average of one pack of cigarettes a day for 1 year.  Yearly screening should continue until it has been 15 years since you quit.  Yearly screening should stop if you develop a health problem that would prevent you from having lung cancer treatment. Breast Cancer  Practice breast self-awareness. This means understanding how your breasts normally appear and feel.  It also means doing regular breast self-exams. Let your health care provider know about any changes, no matter how  small.  If you are in your 20s or 30s, you should have a clinical breast exam (CBE) by a health care provider every 1-3 years as part of a regular health exam.  If you are 40 or older, have a CBE every year. Also consider having a breast X-ray (mammogram) every year.  If you have a family history of breast cancer, talk to your health care provider about genetic screening.  If you are at high risk for breast cancer, talk to your health  care provider about having an MRI and a mammogram every year.  Breast cancer gene (BRCA) assessment is recommended for women who have family members with BRCA-related cancers. BRCA-related cancers include:  Breast.  Ovarian.  Tubal.  Peritoneal cancers.  Results of the assessment will determine the need for genetic counseling and BRCA1 and BRCA2 testing. Cervical Cancer  Your health care provider may recommend that you be screened regularly for cancer of the pelvic organs (ovaries, uterus, and vagina). This screening involves a pelvic examination, including checking for microscopic changes to the surface of your cervix (Pap test). You may be encouraged to have this screening done every 3 years, beginning at age 21.  For women ages 30-65, health care providers may recommend pelvic exams and Pap testing every 3 years, or they may recommend the Pap and pelvic exam, combined with testing for human papilloma virus (HPV), every 5 years. Some types of HPV increase your risk of cervical cancer. Testing for HPV may also be done on women of any age with unclear Pap test results.  Other health care providers may not recommend any screening for nonpregnant women who are considered low risk for pelvic cancer and who do not have symptoms. Ask your health care provider if a screening pelvic exam is right for you.  If you have had past treatment for cervical cancer or a condition that could lead to cancer, you need Pap tests and screening for cancer for at least 20 years after your treatment. If Pap tests have been discontinued, your risk factors (such as having a new sexual partner) need to be reassessed to determine if screening should resume. Some women have medical problems that increase the chance of getting cervical cancer. In these cases, your health care provider may recommend more frequent screening and Pap tests. Colorectal Cancer  This type of cancer can be detected and often prevented.  Routine  colorectal cancer screening usually begins at 63 years of age and continues through 63 years of age.  Your health care provider may recommend screening at an earlier age if you have risk factors for colon cancer.  Your health care provider may also recommend using home test kits to check for hidden blood in the stool.  A small camera at the end of a tube can be used to examine your colon directly (sigmoidoscopy or colonoscopy). This is done to check for the earliest forms of colorectal cancer.  Routine screening usually begins at age 50.  Direct examination of the colon should be repeated every 5-10 years through 63 years of age. However, you may need to be screened more often if early forms of precancerous polyps or small growths are found. Skin Cancer  Check your skin from head to toe regularly.  Tell your health care provider about any new moles or changes in moles, especially if there is a change in a mole's shape or color.  Also tell your health care provider if you have a mole that is   larger than the size of a pencil eraser.  Always use sunscreen. Apply sunscreen liberally and repeatedly throughout the day.  Protect yourself by wearing long sleeves, pants, a wide-brimmed hat, and sunglasses whenever you are outside. Heart disease, diabetes, and high blood pressure  High blood pressure causes heart disease and increases the risk of stroke. High blood pressure is more likely to develop in:  People who have blood pressure in the high end of the normal range (130-139/85-89 mm Hg).  People who are overweight or obese.  People who are African American.  If you are 18-39 years of age, have your blood pressure checked every 3-5 years. If you are 40 years of age or older, have your blood pressure checked every year. You should have your blood pressure measured twice-once when you are at a hospital or clinic, and once when you are not at a hospital or clinic. Record the average of the two  measurements. To check your blood pressure when you are not at a hospital or clinic, you can use:  An automated blood pressure machine at a pharmacy.  A home blood pressure monitor.  If you are between 55 years and 79 years old, ask your health care provider if you should take aspirin to prevent strokes.  Have regular diabetes screenings. This involves taking a blood sample to check your fasting blood sugar level.  If you are at a normal weight and have a low risk for diabetes, have this test once every three years after 63 years of age.  If you are overweight and have a high risk for diabetes, consider being tested at a younger age or more often. Preventing infection Hepatitis B  If you have a higher risk for hepatitis B, you should be screened for this virus. You are considered at high risk for hepatitis B if:  You were born in a country where hepatitis B is common. Ask your health care provider which countries are considered high risk.  Your parents were born in a high-risk country, and you have not been immunized against hepatitis B (hepatitis B vaccine).  You have HIV or AIDS.  You use needles to inject street drugs.  You live with someone who has hepatitis B.  You have had sex with someone who has hepatitis B.  You get hemodialysis treatment.  You take certain medicines for conditions, including cancer, organ transplantation, and autoimmune conditions. Hepatitis C  Blood testing is recommended for:  Everyone born from 1945 through 1965.  Anyone with known risk factors for hepatitis C. Sexually transmitted infections (STIs)  You should be screened for sexually transmitted infections (STIs) including gonorrhea and chlamydia if:  You are sexually active and are younger than 63 years of age.  You are older than 63 years of age and your health care provider tells you that you are at risk for this type of infection.  Your sexual activity has changed since you were last  screened and you are at an increased risk for chlamydia or gonorrhea. Ask your health care provider if you are at risk.  If you do not have HIV, but are at risk, it may be recommended that you take a prescription medicine daily to prevent HIV infection. This is called pre-exposure prophylaxis (PrEP). You are considered at risk if:  You are sexually active and do not regularly use condoms or know the HIV status of your partner(s).  You take drugs by injection.  You are sexually active with a partner   who has HIV. Talk with your health care provider about whether you are at high risk of being infected with HIV. If you choose to begin PrEP, you should first be tested for HIV. You should then be tested every 3 months for as long as you are taking PrEP. Pregnancy  If you are premenopausal and you may become pregnant, ask your health care provider about preconception counseling.  If you may become pregnant, take 400 to 800 micrograms (mcg) of folic acid every day.  If you want to prevent pregnancy, talk to your health care provider about birth control (contraception). Osteoporosis and menopause  Osteoporosis is a disease in which the bones lose minerals and strength with aging. This can result in serious bone fractures. Your risk for osteoporosis can be identified using a bone density scan.  If you are 64 years of age or older, or if you are at risk for osteoporosis and fractures, ask your health care provider if you should be screened.  Ask your health care provider whether you should take a calcium or vitamin D supplement to lower your risk for osteoporosis.  Menopause may have certain physical symptoms and risks.  Hormone replacement therapy may reduce some of these symptoms and risks. Talk to your health care provider about whether hormone replacement therapy is right for you. Follow these instructions at home:  Schedule regular health, dental, and eye exams.  Stay current with your  immunizations.  Do not use any tobacco products including cigarettes, chewing tobacco, or electronic cigarettes.  If you are pregnant, do not drink alcohol.  If you are breastfeeding, limit how much and how often you drink alcohol.  Limit alcohol intake to no more than 1 drink per day for nonpregnant women. One drink equals 12 ounces of beer, 5 ounces of wine, or 1 ounces of hard liquor.  Do not use street drugs.  Do not share needles.  Ask your health care provider for help if you need support or information about quitting drugs.  Tell your health care provider if you often feel depressed.  Tell your health care provider if you have ever been abused or do not feel safe at home. This information is not intended to replace advice given to you by your health care provider. Make sure you discuss any questions you have with your health care provider. Document Released: 03/09/2011 Document Revised: 01/30/2016 Document Reviewed: 05/28/2015  2017 [employer]vier

## 2016-08-07 ENCOUNTER — Ambulatory Visit (INDEPENDENT_AMBULATORY_CARE_PROVIDER_SITE_OTHER): Payer: BLUE CROSS/BLUE SHIELD | Admitting: Internal Medicine

## 2016-08-07 ENCOUNTER — Encounter: Payer: Self-pay | Admitting: Internal Medicine

## 2016-08-07 ENCOUNTER — Other Ambulatory Visit (INDEPENDENT_AMBULATORY_CARE_PROVIDER_SITE_OTHER): Payer: BLUE CROSS/BLUE SHIELD

## 2016-08-07 VITALS — BP 146/94 | HR 71 | Temp 98.1°F | Resp 16 | Ht 60.0 in | Wt 126.0 lb

## 2016-08-07 DIAGNOSIS — M810 Age-related osteoporosis without current pathological fracture: Secondary | ICD-10-CM | POA: Diagnosis not present

## 2016-08-07 DIAGNOSIS — E538 Deficiency of other specified B group vitamins: Secondary | ICD-10-CM

## 2016-08-07 DIAGNOSIS — N390 Urinary tract infection, site not specified: Secondary | ICD-10-CM

## 2016-08-07 DIAGNOSIS — Z23 Encounter for immunization: Secondary | ICD-10-CM

## 2016-08-07 DIAGNOSIS — F411 Generalized anxiety disorder: Secondary | ICD-10-CM | POA: Diagnosis not present

## 2016-08-07 DIAGNOSIS — I10 Essential (primary) hypertension: Secondary | ICD-10-CM

## 2016-08-07 DIAGNOSIS — Z0000 Encounter for general adult medical examination without abnormal findings: Secondary | ICD-10-CM

## 2016-08-07 DIAGNOSIS — D802 Selective deficiency of immunoglobulin A [IgA]: Secondary | ICD-10-CM

## 2016-08-07 DIAGNOSIS — E785 Hyperlipidemia, unspecified: Secondary | ICD-10-CM

## 2016-08-07 DIAGNOSIS — D510 Vitamin B12 deficiency anemia due to intrinsic factor deficiency: Secondary | ICD-10-CM

## 2016-08-07 LAB — COMPREHENSIVE METABOLIC PANEL
ALBUMIN: 4.8 g/dL (ref 3.5–5.2)
ALK PHOS: 67 U/L (ref 39–117)
ALT: 17 U/L (ref 0–35)
AST: 17 U/L (ref 0–37)
BILIRUBIN TOTAL: 0.7 mg/dL (ref 0.2–1.2)
BUN: 8 mg/dL (ref 6–23)
CALCIUM: 9.7 mg/dL (ref 8.4–10.5)
CO2: 25 mEq/L (ref 19–32)
Chloride: 104 mEq/L (ref 96–112)
Creatinine, Ser: 0.79 mg/dL (ref 0.40–1.20)
GFR: 78.04 mL/min (ref >60.00)
GLUCOSE: 103 mg/dL — AB (ref 70–99)
Potassium: 4.4 mEq/L (ref 3.5–5.1)
Sodium: 140 mEq/L (ref 135–145)
TOTAL PROTEIN: 7 g/dL (ref 6.0–8.3)

## 2016-08-07 LAB — CBC WITH DIFFERENTIAL/PLATELET
BASOS ABS: 0 10*3/uL (ref 0.0–0.1)
Basophils Relative: 0.4 % (ref 0.0–3.0)
Eosinophils Absolute: 0.1 10*3/uL (ref 0.0–0.7)
Eosinophils Relative: 2.2 % (ref 0.0–5.0)
HEMATOCRIT: 39.8 % (ref 36.0–46.0)
HEMOGLOBIN: 13.8 g/dL (ref 12.0–15.0)
LYMPHS PCT: 21.7 % (ref 12.0–46.0)
Lymphs Abs: 1.3 10*3/uL (ref 0.7–4.0)
MCHC: 34.6 g/dL (ref 30.0–36.0)
MCV: 88.6 fl (ref 78.0–100.0)
MONOS PCT: 7.7 % (ref 3.0–12.0)
Monocytes Absolute: 0.5 10*3/uL (ref 0.1–1.0)
Neutro Abs: 4.2 10*3/uL (ref 1.4–7.7)
Neutrophils Relative %: 68 % (ref 43.0–77.0)
Platelets: 201 10*3/uL (ref 150.0–400.0)
RBC: 4.49 Mil/uL (ref 3.87–5.11)
RDW: 13.1 % (ref 11.5–15.5)
WBC: 6.2 10*3/uL (ref 4.0–10.5)

## 2016-08-07 LAB — LIPID PANEL
Cholesterol: 219 mg/dL — ABNORMAL HIGH (ref 0–200)
HDL: 81.9 mg/dL (ref >39.00)
LDL Cholesterol: 122 mg/dL — ABNORMAL HIGH (ref 0–99)
NONHDL: 136.92
TRIGLYCERIDES: 74 mg/dL (ref 0.0–149.0)
Total CHOL/HDL Ratio: 3
VLDL: 14.8 mg/dL (ref 0.0–40.0)

## 2016-08-07 LAB — VITAMIN B12: VITAMIN B 12: 370 pg/mL (ref 211–911)

## 2016-08-07 LAB — VITAMIN D 25 HYDROXY (VIT D DEFICIENCY, FRACTURES): VITD: 32.89 ng/mL (ref 30.00–100.00)

## 2016-08-07 LAB — TSH: TSH: 2.29 u[IU]/mL (ref 0.35–4.50)

## 2016-08-07 MED ORDER — VITAMIN B-12 1000 MCG PO TABS: 1000.0000 ug | ORAL_TABLET | Freq: Every day | ORAL | Status: DC

## 2016-08-07 NOTE — Assessment & Plan Note (Signed)
Check level 

## 2016-08-07 NOTE — Assessment & Plan Note (Addendum)
Slightly low level 2008 No concerning history or symptoms

## 2016-08-07 NOTE — Assessment & Plan Note (Signed)
Continue macrobid for prevention No symptoms of a UTI currently and no UTI since last visit

## 2016-08-07 NOTE — Assessment & Plan Note (Signed)
Takes xanax about 2/week Discussed concerns with memory issues and inc risk of falls Keep use to a minimum

## 2016-08-07 NOTE — Assessment & Plan Note (Addendum)
Was going to get prolia, but wishes to hold off dexa due 6/18 Continue D daily, be more consistent with calcium intake Increase walking if possible

## 2016-08-07 NOTE — Assessment & Plan Note (Signed)
Check lipid panel  Continue daily statin Regular exercise and healthy diet encouraged  

## 2016-08-07 NOTE — Assessment & Plan Note (Signed)
BP elevated today - will start checking at home  Continue current medications for now - if BP elevated at home will adjust cmp

## 2016-08-07 NOTE — Progress Notes (Signed)
Pre visit review using our clinic review tool, if applicable. No additional management support is needed unless otherwise documented below in the visit note. 

## 2016-08-07 NOTE — Assessment & Plan Note (Signed)
?   Truly pernicious anemia vs B12 def Taking B12 daily Check B12 level

## 2016-08-09 ENCOUNTER — Encounter: Payer: Self-pay | Admitting: Internal Medicine

## 2016-10-09 ENCOUNTER — Other Ambulatory Visit: Payer: Self-pay | Admitting: Internal Medicine

## 2016-10-10 NOTE — Telephone Encounter (Signed)
RX faxed to [facility] 

## 2016-11-13 ENCOUNTER — Other Ambulatory Visit: Payer: Self-pay | Admitting: *Deleted

## 2016-11-13 MED ORDER — DICLOFENAC SODIUM 75 MG PO TBEC: DELAYED_RELEASE_TABLET | ORAL | 1 refills | Status: DC

## 2016-12-13 ENCOUNTER — Other Ambulatory Visit: Payer: Self-pay | Admitting: Internal Medicine

## 2017-01-04 ENCOUNTER — Other Ambulatory Visit: Payer: Self-pay | Admitting: Internal Medicine

## 2017-01-10 ENCOUNTER — Other Ambulatory Visit: Payer: Self-pay | Admitting: Internal Medicine

## 2017-02-05 ENCOUNTER — Ambulatory Visit: Payer: BLUE CROSS/BLUE SHIELD | Admitting: Internal Medicine

## 2017-02-15 LAB — HM MAMMOGRAPHY

## 2017-02-15 LAB — HM DEXA SCAN

## 2017-02-18 LAB — HM PAP SMEAR

## 2017-02-21 NOTE — Progress Notes (Signed)
Subjective:    Patient ID: [name], female    DOB: August 22, 1951, 66 y.o.   MRN: [id number]  HPI The patient is here for follow up.  Hypertension: She is taking her medication daily. She is compliant with a low sodium diet.  She denies chest pain, palpitations, edema, shortness of breath and regular headaches. She is not exercising regularly, but plans on getting back into it.  She does not monitor her blood pressure at home.    Hyperlipidemia: She is taking her medication daily. She is compliant with a low fat/cholesterol diet. She is not exercising regularly. She denies myalgias.   Recurrent UTI:  She takes Macrobid daily to prevent UTI's.  She denies dysuria, urinary frequency and hematuria.    Anxiety, insomnia: She is no longer taking xanax as needed for anxiety and insomnia.  She last took it a few months ago.    Left shoulder pain, back pain:  She takes diclofenac once a day.  She also takes advil on occasion.   The medication helps and wants to make sure it is ok to continue to take it.   Rash b/l lower legs:  She did see derm and was given a steroid cream.  It did not help much.  She would like to see a different dermatologist.   Medications and allergies reviewed with patient and updated if appropriate.  Patient Active Problem List   Diagnosis Date Noted  . B12 deficiency 08/07/2016  . Recurrent UTI 11/08/2015  . Non-celiac gluten sensitivity   . Anxiety state 01/17/2010  . ALLERGIC RHINITIS 01/17/2010  . Dyslipidemia 07/12/2009  . Essential hypertension 07/12/2009  . Osteoporosis 06/22/2008  . 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 ANEMIA, PERNICIOUS 10/14/2007  . IRRITABLE BOWEL SYNDROME 10/14/2007    Current Outpatient Prescriptions on File Prior to Visit  Medication Sig Dispense Refill  . amLODipine (NORVASC) 10 MG tablet TAKE 1 TABLET (10 MG TOTAL) BY MOUTH DAILY. 90 tablet 0  . diclofenac (VOLTAREN) 75 MG EC tablet TAKE 1 TABLET (75 MG TOTAL)  BY MOUTH 2 (TWO) TIMES DAILY. 180 tablet 1  . metaxalone (SKELAXIN) 800 MG tablet TAKE 1 TABLET (800 MG TOTAL) BY MOUTH EVERY 8 (EIGHT) HOURS AS NEEDED FOR MUSCLE SPASMS. 60 tablet 0  . nitrofurantoin, macrocrystal-monohydrate, (MACROBID) 100 MG capsule TAKE ONE CAPSULE BY MOUTH TWICE A DAY 60 capsule 1  . pravastatin (PRAVACHOL) 20 MG tablet TAKE 1 TABLET BY MOUTH EVERY DAY 90 tablet 0  . triamcinolone ointment (KENALOG) 0.1 % APPLY TWICE A DAY TO SKIN 1 TO 2 WEEKS  0  . vitamin B-12 (CYANOCOBALAMIN) 1000 MCG tablet Take 1 tablet (1,000 mcg total) by mouth daily.     No current facility-administered medications on file prior to visit.     Past Medical History:  Diagnosis Date  . ALLERGIC RHINITIS   . ANEMIA, PERNICIOUS   . ANXIETY   . BILIARY DYSKINESIA   . CARPAL TUNNEL SYNDROME, LEFT   . DYSLIPIDEMIA   . Headache(784.0)   . HYPERTENSION   . Irritable bowel syndrome   . Non-celiac gluten sensitivity   . OSTEOPENIA   . 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 APPENDECTOMY    . CARPAL TUNNEL RELEASE    . CESAREAN SECTION  76 & 85   x's 2   . CHOLECYSTECTOMY  2005   Dr. Dalbert Batman (Gallbladder dyskinesia)  . COLONOSCOPY    .  DILATION AND CURETTAGE OF UTERUS    . LEFT HEART CATHETERIZATION WITH CORONARY ANGIOGRAM N/A 05/19/2012   Procedure: LEFT HEART CATHETERIZATION WITH CORONARY ANGIOGRAM;  Surgeon: Hillary Bow, MD;  Location: [facility] CATH LAB;  Service: Cardiovascular;  Laterality: N/A;  . ROTATOR CUFF REPAIR    . TONSILLECTOMY    . UPPER GASTROINTESTINAL ENDOSCOPY      Social History   Social History  . Marital status: Married    Spouse name: N/A  . Number of children: N/A  . Years of education: N/A   Occupational History  . Dental office [employer]   Social History Main Topics  . Smoking status: Never Smoker  . Smokeless tobacco: Never Used  . Alcohol use 0.6 oz/week    1 Glasses of wine  per week     Comment: occassional  . Drug use: No  . Sexual activity: Yes    Birth control/ protection: Post-menopausal   Other Topics Concern  . 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 Colitis Mother   . Heart disease Mother   . Hyperlipidemia Mother   . Lung cancer Mother        Cause of death  . Hypertension Mother   . Lung cancer Father   . Leukemia Brother   . Coronary artery disease Brother        MI in late 47's. Still living  . Ovarian cancer Maternal Aunt   . Diabetes Maternal Aunt   . Coronary artery disease Brother        MI in 84's. Also still living.  . Colon cancer Neg Hx   . Colon polyps Neg Hx     Review of Systems  Constitutional: Negative for chills and fever.  Respiratory: Negative for cough, shortness of breath and wheezing.   Cardiovascular: Positive for leg swelling. Negative for chest pain and palpitations.  Genitourinary: Negative for dysuria, frequency and hematuria.  Neurological: Positive for headaches (occasional). Negative for light-headedness.       Objective:   Vitals:   02/23/17 0944  BP: (!) 154/88  Pulse: 73  Resp: 16  Temp: 98.7 F (37.1 C)   Wt Readings from Last 3 Encounters:  02/23/17 121 lb (54.9 kg)  08/07/16 126 lb (57.2 kg)  12/06/15 132 lb (59.9 kg)   Body mass index is 23.63 kg/m.   Physical Exam    Constitutional: Appears well-developed and well-nourished. No distress.  HENT:  Head: Normocephalic and atraumatic.  Neck: Neck supple. No tracheal deviation present. No thyromegaly present.  No cervical lymphadenopathy Cardiovascular: Normal rate, regular rhythm and normal heart sounds.   No murmur heard. No carotid bruit .  No edema Pulmonary/Chest: Effort normal and breath sounds normal. No respiratory distress. No has no wheezes. No rales.  Skin: Skin is warm and dry. Not diaphoretic.  rash on b/l  lower legs.  Psychiatric: Normal mood and affect. Behavior is normal.      Assessment & Plan:    See Problem List for Assessment and Plan of chronic medical problems.

## 2017-02-21 NOTE — Patient Instructions (Addendum)
Monitor your BP at home - goal is < 140/90.  If it is elevated return so we can adjust your medication.  Test(s) ordered today. Your results will be released to [place] (or called to you) after review, usually within 72hours after test completion. If any changes need to be made, you will be notified at that same time.   Medications reviewed and updated.   No changes recommended at this time.  Your prescription(s) have been submitted to your pharmacy. Please take as directed and contact our office if you believe you are having problem(s) with the medication(s).   Please followup in  6 months

## 2017-02-23 ENCOUNTER — Encounter: Payer: Self-pay | Admitting: Internal Medicine

## 2017-02-23 ENCOUNTER — Other Ambulatory Visit (INDEPENDENT_AMBULATORY_CARE_PROVIDER_SITE_OTHER): Payer: BLUE CROSS/BLUE SHIELD

## 2017-02-23 ENCOUNTER — Ambulatory Visit (INDEPENDENT_AMBULATORY_CARE_PROVIDER_SITE_OTHER): Payer: BLUE CROSS/BLUE SHIELD | Admitting: Internal Medicine

## 2017-02-23 VITALS — BP 154/88 | HR 73 | Temp 98.7°F | Resp 16 | Ht 60.0 in | Wt 121.0 lb

## 2017-02-23 DIAGNOSIS — F411 Generalized anxiety disorder: Secondary | ICD-10-CM | POA: Diagnosis not present

## 2017-02-23 DIAGNOSIS — G8929 Other chronic pain: Secondary | ICD-10-CM

## 2017-02-23 DIAGNOSIS — I10 Essential (primary) hypertension: Secondary | ICD-10-CM

## 2017-02-23 DIAGNOSIS — N390 Urinary tract infection, site not specified: Secondary | ICD-10-CM

## 2017-02-23 DIAGNOSIS — E785 Hyperlipidemia, unspecified: Secondary | ICD-10-CM

## 2017-02-23 DIAGNOSIS — M25512 Pain in left shoulder: Secondary | ICD-10-CM

## 2017-02-23 LAB — COMPREHENSIVE METABOLIC PANEL
ALBUMIN: 5.1 g/dL (ref 3.5–5.2)
ALT: 17 U/L (ref 0–35)
AST: 17 U/L (ref 0–37)
Alkaline Phosphatase: 67 U/L (ref 39–117)
BUN: 7 mg/dL (ref 6–23)
CALCIUM: 9.9 mg/dL (ref 8.4–10.5)
CHLORIDE: 103 meq/L (ref 96–112)
CO2: 28 mEq/L (ref 19–32)
Creatinine, Ser: 0.77 mg/dL (ref 0.40–1.20)
GFR: 80.24 mL/min (ref >60.00)
Glucose, Bld: 105 mg/dL — ABNORMAL HIGH (ref 70–99)
POTASSIUM: 4.7 meq/L (ref 3.5–5.1)
Sodium: 138 mEq/L (ref 135–145)
Total Bilirubin: 0.7 mg/dL (ref 0.2–1.2)
Total Protein: 6.8 g/dL (ref 6.0–8.3)

## 2017-02-23 LAB — LIPID PANEL
CHOL/HDL RATIO: 3
Cholesterol: 239 mg/dL — ABNORMAL HIGH (ref 0–200)
HDL: 84.8 mg/dL (ref >39.00)
LDL CALC: 140 mg/dL — AB (ref 0–99)
NonHDL: 154.23
TRIGLYCERIDES: 69 mg/dL (ref 0.0–149.0)
VLDL: 13.8 mg/dL (ref 0.0–40.0)

## 2017-02-23 MED ORDER — NITROFURANTOIN MONOHYD MACRO 100 MG PO CAPS: 100.0000 mg | ORAL_CAPSULE | Freq: Two times a day (BID) | ORAL | 1 refills | Status: DC

## 2017-02-23 MED ORDER — PRAVASTATIN SODIUM 20 MG PO TABS: 20.0000 mg | ORAL_TABLET | Freq: Every day | ORAL | 3 refills | Status: DC

## 2017-02-23 MED ORDER — DICLOFENAC SODIUM 75 MG PO TBEC: DELAYED_RELEASE_TABLET | ORAL | 1 refills | Status: DC

## 2017-02-23 MED ORDER — AMLODIPINE BESYLATE 10 MG PO TABS: 10.0000 mg | ORAL_TABLET | Freq: Every day | ORAL | 1 refills | Status: DC

## 2017-02-23 NOTE — Assessment & Plan Note (Signed)
Elevated here again today - she has not been checking it at home as I asked her to do at her last visit She does feel it is better controlled than what it was today Stressed the importance of making sure BP is controlled - discussed possible consequences of uncontrolled BP No change in medications today Start regular exercise

## 2017-02-23 NOTE — Assessment & Plan Note (Signed)
Takes diclofenac or advil as needed Discussed concerns of taking medication long term Take with food

## 2017-02-23 NOTE — Assessment & Plan Note (Signed)
On macrobid for prevention - takes once daily  No UTI symptoms Will continue macrobid daily

## 2017-02-23 NOTE — Assessment & Plan Note (Signed)
Has not been taking xanax as needed - will try to avoid She has retired and that has probably helped her anxiety

## 2017-02-23 NOTE — Assessment & Plan Note (Signed)
Check lipid panel  Continue daily statin Regular exercise and healthy diet encouraged  

## 2017-02-24 ENCOUNTER — Telehealth: Payer: Self-pay | Admitting: Internal Medicine

## 2017-02-24 NOTE — Telephone Encounter (Signed)
Rec'd from [facility] forward 16 pages to [employer]

## 2017-02-25 ENCOUNTER — Telehealth: Payer: Self-pay | Admitting: Internal Medicine

## 2017-02-25 ENCOUNTER — Other Ambulatory Visit: Payer: Self-pay | Admitting: Emergency Medicine

## 2017-02-25 DIAGNOSIS — Z23 Encounter for immunization: Secondary | ICD-10-CM

## 2017-02-25 MED ORDER — PRAVASTATIN SODIUM 40 MG PO TABS: 40.0000 mg | ORAL_TABLET | Freq: Every day | ORAL | 3 refills | Status: DC

## 2017-02-25 NOTE — Telephone Encounter (Signed)
Pt would like to receive the Shingrix Vaccine, she was notified of it being on backorder and is ok with it being sent to the pharmacy  and has verified with insurance that they will cover it.   [facility] [address]

## 2017-02-26 MED ORDER — ZOSTER VAC RECOMB ADJUVANTED 50 MCG/0.5ML IM SUSR: 0.5000 mL | Freq: Once | INTRAMUSCULAR | 1 refills | Status: AC

## 2017-02-26 NOTE — Telephone Encounter (Signed)
[facility]

## 2017-03-05 ENCOUNTER — Encounter: Payer: Self-pay | Admitting: Internal Medicine

## 2017-03-05 NOTE — Progress Notes (Unsigned)
Results entered and sent to scan  

## 2017-04-02 ENCOUNTER — Encounter: Payer: Self-pay | Admitting: Internal Medicine

## 2017-04-03 MED ORDER — ZOSTER VAC RECOMB ADJUVANTED 50 MCG/0.5ML IM SUSR: 0.5000 mL | Freq: Once | INTRAMUSCULAR | 1 refills | Status: AC

## 2017-04-15 ENCOUNTER — Other Ambulatory Visit: Payer: Self-pay | Admitting: Internal Medicine

## 2017-04-28 ENCOUNTER — Telehealth: Payer: Self-pay | Admitting: Internal Medicine

## 2017-04-28 MED ORDER — PRAVASTATIN SODIUM 40 MG PO TABS: 40.0000 mg | ORAL_TABLET | Freq: Every day | ORAL | 3 refills | Status: DC

## 2017-04-28 NOTE — Telephone Encounter (Signed)
Reviewed chart see lab results from 02/23/17. MD did increase pravastatin to 40 mg. [facility].../lmb

## 2017-04-28 NOTE — Telephone Encounter (Signed)
Pt called in and said that Dr Burn had her double to pravastatin and once she finished her script Dr burns was going to call in a refill of the 40mg   [facility] [address]

## 2017-05-27 NOTE — Telephone Encounter (Signed)
Pts Rx was sent in June and July, but she has been on a list at her pharmacy since then and has not received it. She would like to be put on the list here. Please call back in regard.

## 2017-05-28 NOTE — Telephone Encounter (Signed)
Patient has been added to shingrix waitlist 

## 2017-06-18 ENCOUNTER — Ambulatory Visit (INDEPENDENT_AMBULATORY_CARE_PROVIDER_SITE_OTHER): Payer: BLUE CROSS/BLUE SHIELD

## 2017-06-18 DIAGNOSIS — Z299 Encounter for prophylactic measures, unspecified: Secondary | ICD-10-CM

## 2017-08-10 NOTE — Patient Instructions (Addendum)
Test(s) ordered today. Your results will be released to [place] (or called to you) after review, usually within 72hours after test completion. If any changes need to be made, you will be notified at that same time.  All other Health Maintenance issues reviewed.   All recommended immunizations and age-appropriate screenings are up-to-date or discussed.  Flu immunization administered today.    Medications reviewed and updated.  Changes include starting hydrochlorothiazide 12.5 mg daily  Your prescription(s) have been submitted to your pharmacy. Please take as directed and contact our office if you believe you are having problem(s) with the medication(s).   Please followup in 6 months   Health Maintenance, Female Adopting a healthy lifestyle and getting preventive care can go a long way to promote health and wellness. Talk with your health care provider about what schedule of regular examinations is right for you. This is a good chance for you to check in with your provider about disease prevention and staying healthy. In between checkups, there are plenty of things you can do on your own. Experts have done a lot of research about which lifestyle changes and preventive measures are most likely to keep you healthy. Ask your health care provider for more information. Weight and diet Eat a healthy diet  Be sure to include plenty of vegetables, fruits, low-fat dairy products, and lean protein.  Do not eat a lot of foods high in solid fats, added sugars, or salt.  Get regular exercise. This is one of the most important things you can do for your health. ? Most adults should exercise for at least 150 minutes each week. The exercise should increase your heart rate and make you sweat (moderate-intensity exercise). ? Most adults should also do strengthening exercises at least twice a week. This is in addition to the moderate-intensity exercise.  Maintain a healthy weight  Body mass index (BMI)  is a measurement that can be used to identify possible weight problems. It estimates body fat based on height and weight. Your health care provider can help determine your BMI and help you achieve or maintain a healthy weight.  For females 1 years of age and older: ? A BMI below 18.5 is considered underweight. ? A BMI of 18.5 to 24.9 is normal. ? A BMI of 25 to 29.9 is considered overweight. ? A BMI of 30 and above is considered obese.  Watch levels of cholesterol and blood lipids  You should start having your blood tested for lipids and cholesterol at 64 years of age, then have this test every 5 years.  You may need to have your cholesterol levels checked more often if: ? Your lipid or cholesterol levels are high. ? You are older than 64 years of age. ? You are at high risk for heart disease.  Cancer screening Lung Cancer  Lung cancer screening is recommended for adults 43-24 years old who are at high risk for lung cancer because of a history of smoking.  A yearly low-dose CT scan of the lungs is recommended for people who: ? Currently smoke. ? Have quit within the past 15 years. ? Have at least a 30-pack-year history of smoking. A pack year is smoking an average of one pack of cigarettes a day for 1 year.  Yearly screening should continue until it has been 15 years since you quit.  Yearly screening should stop if you develop a health problem that would prevent you from having lung cancer treatment.  Breast Cancer  Practice  breast self-awareness. This means understanding how your breasts normally appear and feel.  It also means doing regular breast self-exams. Let your health care provider know about any changes, no matter how small.  If you are in your 20s or 30s, you should have a clinical breast exam (CBE) by a health care provider every 1-3 years as part of a regular health exam.  If you are 73 or older, have a CBE every year. Also consider having a breast X-ray  (mammogram) every year.  If you have a family history of breast cancer, talk to your health care provider about genetic screening.  If you are at high risk for breast cancer, talk to your health care provider about having an MRI and a mammogram every year.  Breast cancer gene (BRCA) assessment is recommended for women who have family members with BRCA-related cancers. BRCA-related cancers include: ? Breast. ? Ovarian. ? Tubal. ? Peritoneal cancers.  Results of the assessment will determine the need for genetic counseling and BRCA1 and BRCA2 testing.  Cervical Cancer Your health care provider may recommend that you be screened regularly for cancer of the pelvic organs (ovaries, uterus, and vagina). This screening involves a pelvic examination, including checking for microscopic changes to the surface of your cervix (Pap test). You may be encouraged to have this screening done every 3 years, beginning at age 23.  For women ages 22-65, health care providers may recommend pelvic exams and Pap testing every 3 years, or they may recommend the Pap and pelvic exam, combined with testing for human papilloma virus (HPV), every 5 years. Some types of HPV increase your risk of cervical cancer. Testing for HPV may also be done on women of any age with unclear Pap test results.  Other health care providers may not recommend any screening for nonpregnant women who are considered low risk for pelvic cancer and who do not have symptoms. Ask your health care provider if a screening pelvic exam is right for you.  If you have had past treatment for cervical cancer or a condition that could lead to cancer, you need Pap tests and screening for cancer for at least 20 years after your treatment. If Pap tests have been discontinued, your risk factors (such as having a new sexual partner) need to be reassessed to determine if screening should resume. Some women have medical problems that increase the chance of getting  cervical cancer. In these cases, your health care provider may recommend more frequent screening and Pap tests.  Colorectal Cancer  This type of cancer can be detected and often prevented.  Routine colorectal cancer screening usually begins at 64 years of age and continues through 64 years of age.  Your health care provider may recommend screening at an earlier age if you have risk factors for colon cancer.  Your health care provider may also recommend using home test kits to check for hidden blood in the stool.  A small camera at the end of a tube can be used to examine your colon directly (sigmoidoscopy or colonoscopy). This is done to check for the earliest forms of colorectal cancer.  Routine screening usually begins at age 87.  Direct examination of the colon should be repeated every 5-10 years through 64 years of age. However, you may need to be screened more often if early forms of precancerous polyps or small growths are found.  Skin Cancer  Check your skin from head to toe regularly.  Tell your health care provider  about any new moles or changes in moles, especially if there is a change in a mole's shape or color.  Also tell your health care provider if you have a mole that is larger than the size of a pencil eraser.  Always use sunscreen. Apply sunscreen liberally and repeatedly throughout the day.  Protect yourself by wearing long sleeves, pants, a wide-brimmed hat, and sunglasses whenever you are outside.  Heart disease, diabetes, and high blood pressure  High blood pressure causes heart disease and increases the risk of stroke. High blood pressure is more likely to develop in: ? People who have blood pressure in the high end of the normal range (130-139/85-89 mm Hg). ? People who are overweight or obese. ? People who are African American.  If you are 58-29 years of age, have your blood pressure checked every 3-5 years. If you are 18 years of age or older, have your  blood pressure checked every year. You should have your blood pressure measured twice-once when you are at a hospital or clinic, and once when you are not at a hospital or clinic. Record the average of the two measurements. To check your blood pressure when you are not at a hospital or clinic, you can use: ? An automated blood pressure machine at a pharmacy. ? A home blood pressure monitor.  If you are between 54 years and 38 years old, ask your health care provider if you should take aspirin to prevent strokes.  Have regular diabetes screenings. This involves taking a blood sample to check your fasting blood sugar level. ? If you are at a normal weight and have a low risk for diabetes, have this test once every three years after 64 years of age. ? If you are overweight and have a high risk for diabetes, consider being tested at a younger age or more often. Preventing infection Hepatitis B  If you have a higher risk for hepatitis B, you should be screened for this virus. You are considered at high risk for hepatitis B if: ? You were born in a country where hepatitis B is common. Ask your health care provider which countries are considered high risk. ? Your parents were born in a high-risk country, and you have not been immunized against hepatitis B (hepatitis B vaccine). ? You have HIV or AIDS. ? You use needles to inject street drugs. ? You live with someone who has hepatitis B. ? You have had sex with someone who has hepatitis B. ? You get hemodialysis treatment. ? You take certain medicines for conditions, including cancer, organ transplantation, and autoimmune conditions.  Hepatitis C  Blood testing is recommended for: ? Everyone born from [birth year] through 1963. ? Anyone with known risk factors for hepatitis C.  Sexually transmitted infections (STIs)  You should be screened for sexually transmitted infections (STIs) including gonorrhea and chlamydia if: ? You are sexually active and  are younger than 63 years of age. ? You are older than 64 years of age and your health care provider tells you that you are at risk for this type of infection. ? Your sexual activity has changed since you were last screened and you are at an increased risk for chlamydia or gonorrhea. Ask your health care provider if you are at risk.  If you do not have HIV, but are at risk, it may be recommended that you take a prescription medicine daily to prevent HIV infection. This is called pre-exposure prophylaxis (PrEP). You are  considered at risk if: ? You are sexually active and do not regularly use condoms or know the HIV status of your partner(s). ? You take drugs by injection. ? You are sexually active with a partner who has HIV.  Talk with your health care provider about whether you are at high risk of being infected with HIV. If you choose to begin PrEP, you should first be tested for HIV. You should then be tested every 3 months for as long as you are taking PrEP. Pregnancy  If you are premenopausal and you may become pregnant, ask your health care provider about preconception counseling.  If you may become pregnant, take 400 to 800 micrograms (mcg) of folic acid every day.  If you want to prevent pregnancy, talk to your health care provider about birth control (contraception). Osteoporosis and menopause  Osteoporosis is a disease in which the bones lose minerals and strength with aging. This can result in serious bone fractures. Your risk for osteoporosis can be identified using a bone density scan.  If you are 39 years of age or older, or if you are at risk for osteoporosis and fractures, ask your health care provider if you should be screened.  Ask your health care provider whether you should take a calcium or vitamin D supplement to lower your risk for osteoporosis.  Menopause may have certain physical symptoms and risks.  Hormone replacement therapy may reduce some of these symptoms and  risks. Talk to your health care provider about whether hormone replacement therapy is right for you. Follow these instructions at home:  Schedule regular health, dental, and eye exams.  Stay current with your immunizations.  Do not use any tobacco products including cigarettes, chewing tobacco, or electronic cigarettes.  If you are pregnant, do not drink alcohol.  If you are breastfeeding, limit how much and how often you drink alcohol.  Limit alcohol intake to no more than 1 drink per day for nonpregnant women. One drink equals 12 ounces of beer, 5 ounces of wine, or 1 ounces of hard liquor.  Do not use street drugs.  Do not share needles.  Ask your health care provider for help if you need support or information about quitting drugs.  Tell your health care provider if you often feel depressed.  Tell your health care provider if you have ever been abused or do not feel safe at home. This information is not intended to replace advice given to you by your health care provider. Make sure you discuss any questions you have with your health care provider. Document Released: 03/09/2011 Document Revised: 01/30/2016 Document Reviewed: 05/28/2015 Elsevier Interactive Patient Education  [employer].

## 2017-08-10 NOTE — Progress Notes (Signed)
Subjective:    Patient ID: [name], female    DOB: [birth year], 66 y.o.   MRN: [id number]  HPI She is here for a physical exam.   She has no concerns.  She is retired and is walking on a daily basis.  Medications and allergies reviewed with patient and updated if appropriate.  Patient Active Problem List   Diagnosis Date Noted  . Shoulder pain, left 02/23/2017  . B12 deficiency 08/07/2016  . Recurrent UTI 11/08/2015  . Non-celiac gluten sensitivity   . Anxiety state 01/17/2010  . ALLERGIC RHINITIS 01/17/2010  . Dyslipidemia 07/12/2009  . Essential hypertension 07/12/2009  . Osteoporosis 06/22/2008  . 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 ANEMIA, PERNICIOUS 10/14/2007  . IRRITABLE BOWEL SYNDROME 10/14/2007    Current Outpatient Medications on File Prior to Visit  Medication Sig Dispense Refill  . amLODipine (NORVASC) 10 MG tablet Take 1 tablet (10 mg total) by mouth daily. 90 tablet 1  . diclofenac (VOLTAREN) 75 MG EC tablet TAKE 1 TABLET (75 MG TOTAL) BY MOUTH 2 (TWO) TIMES DAILY. 180 tablet 1  . metaxalone (SKELAXIN) 800 MG tablet TAKE 1 TABLET (800 MG TOTAL) BY MOUTH EVERY 8 (EIGHT) HOURS AS NEEDED FOR MUSCLE SPASMS. 60 tablet 0  . nitrofurantoin, macrocrystal-monohydrate, (MACROBID) 100 MG capsule Take 1 capsule (100 mg total) by mouth 2 (two) times daily. 180 capsule 1  . pravastatin (PRAVACHOL) 40 MG tablet Take 1 tablet (40 mg total) by mouth daily. 90 tablet 3  . vitamin B-12 (CYANOCOBALAMIN) 1000 MCG tablet Take 1 tablet (1,000 mcg total) by mouth daily.     No current facility-administered medications on file prior to visit.     Past Medical History:  Diagnosis Date  . ALLERGIC RHINITIS   . ANEMIA, PERNICIOUS   . ANXIETY   . BILIARY DYSKINESIA   . CARPAL TUNNEL SYNDROME, LEFT   . DYSLIPIDEMIA   . Headache(784.0)   . HYPERTENSION   . Irritable bowel syndrome   . Non-celiac gluten sensitivity   . OSTEOPENIA   . Selective IgA  immunodeficiency ([place])    borderline    Past Surgical History:  Procedure Laterality Date  . APPENDECTOMY    . CARPAL TUNNEL RELEASE    . CESAREAN SECTION  74 & 83   x's 2   . CHOLECYSTECTOMY  2005   Dr. Dalbert Batman (Gallbladder dyskinesia)  . COLONOSCOPY    . DILATION AND CURETTAGE OF UTERUS    . LEFT HEART CATHETERIZATION WITH CORONARY ANGIOGRAM N/A 05/19/2012   Procedure: LEFT HEART CATHETERIZATION WITH CORONARY ANGIOGRAM;  Surgeon: Hillary Bow, MD;  Location: [facility] CATH LAB;  Service: Cardiovascular;  Laterality: N/A;  . ROTATOR CUFF REPAIR    . TONSILLECTOMY    . UPPER GASTROINTESTINAL ENDOSCOPY      Social History   Socioeconomic History  . Marital status: Married    Spouse name: None  . Number of children: None  . Years of education: None  . Highest education level: None  Social Needs  . Financial resource strain: None  . Food insecurity - worry: None  . Food insecurity - inability: None  . Transportation needs - medical: None  . Transportation needs - non-medical: None  Occupational History  . Occupation: Building surveyor: [employer]  Tobacco Use  . Smoking status: Never Smoker  . Smokeless tobacco: Never Used  Substance and Sexual Activity  . Alcohol use: Yes    Alcohol/week: 0.6  oz    Types: 1 Glasses of wine per week    Comment: occassional  . Drug use: No  . Sexual activity: Yes    Birth control/protection: Post-menopausal  Other Topics Concern  . None  Social History Narrative   Married and lives with husband.    Dental hygienist    Family History  Problem Relation Age of Onset  . Colitis Mother   . Heart disease Mother   . Hyperlipidemia Mother   . Lung cancer Mother        Cause of death  . Hypertension Mother   . Lung cancer Father   . Leukemia Brother   . Coronary artery disease Brother        MI in late 73's. Still living  . Ovarian cancer Maternal Aunt   . Diabetes Maternal Aunt   . Coronary artery disease Brother         MI in 65's. Also still living.  . Colon cancer Neg Hx   . Colon polyps Neg Hx     Review of Systems  Constitutional: Negative for chills and fever.  Eyes: Negative for visual disturbance.  Respiratory: Negative for cough, shortness of breath and wheezing.   Cardiovascular: Negative for chest pain (rare tightness with low sugars and anxiety), palpitations and leg swelling.  Gastrointestinal: Negative for abdominal pain, blood in stool, constipation, diarrhea and nausea.       Rare gerd  Genitourinary: Negative for dysuria and hematuria.  Musculoskeletal: Positive for arthralgias.  Skin: Negative for color change and rash.  Neurological: Negative for light-headedness and headaches.  Psychiatric/Behavioral: Negative for dysphoric mood. The patient is not nervous/anxious.        Objective:   Vitals:   08/11/17 0912  BP: (!) 152/90  Pulse: 68  Resp: 16  Temp: 97.8 F (36.6 C)  SpO2: 97%   Filed Weights   08/11/17 0912  Weight: 123 lb (55.8 kg)   Body mass index is 24.02 kg/m.  Wt Readings from Last 3 Encounters:  08/11/17 123 lb (55.8 kg)  02/23/17 121 lb (54.9 kg)  08/07/16 126 lb (57.2 kg)     Physical Exam Constitutional: She appears well-developed and well-nourished. No distress.  HENT:  Head: Normocephalic and atraumatic.  Right Ear: External ear normal. Normal ear canal and TM Left Ear: External ear normal.  Normal ear canal and TM Mouth/Throat: Oropharynx is clear and moist.  Eyes: Conjunctivae and EOM are normal.  Neck: Neck supple. No tracheal deviation present. No thyromegaly present.  No carotid bruit  Cardiovascular: Normal rate, regular rhythm and normal heart sounds.   No murmur heard.  No edema. Pulmonary/Chest: Effort normal and breath sounds normal. No respiratory distress. She has no wheezes. She has no rales.  Breast: deferred to Gyn Abdominal: Soft. She exhibits no distension. There is no tenderness.  Lymphadenopathy: She has no cervical  adenopathy.  Skin: Skin is warm and dry. She is not diaphoretic.  Psychiatric: She has a normal mood and affect. Her behavior is normal.        Assessment & Plan:   Physical exam: Screening blood work    ordered Immunizations  Flu vaccine today , had #1 shingrix, others up to date Colonoscopy   Up to date  Mammogram   Up to date  Gyn   Up to date  Dexa     Up to date  Eye exams  Up to date  EKG   Last EKG 02/2014 Exercise  -  walking daily Weight  Norma BMI Skin   No concerns  Substance abuse   none  See Problem List for Assessment and Plan of chronic medical problems.

## 2017-08-11 ENCOUNTER — Ambulatory Visit: Payer: BLUE CROSS/BLUE SHIELD | Admitting: Internal Medicine

## 2017-08-11 ENCOUNTER — Other Ambulatory Visit (INDEPENDENT_AMBULATORY_CARE_PROVIDER_SITE_OTHER): Payer: BLUE CROSS/BLUE SHIELD

## 2017-08-11 ENCOUNTER — Encounter: Payer: Self-pay | Admitting: Internal Medicine

## 2017-08-11 VITALS — BP 152/90 | HR 68 | Temp 97.8°F | Resp 16 | Ht 60.0 in | Wt 123.0 lb

## 2017-08-11 DIAGNOSIS — N390 Urinary tract infection, site not specified: Secondary | ICD-10-CM

## 2017-08-11 DIAGNOSIS — E538 Deficiency of other specified B group vitamins: Secondary | ICD-10-CM | POA: Diagnosis not present

## 2017-08-11 DIAGNOSIS — I10 Essential (primary) hypertension: Secondary | ICD-10-CM | POA: Diagnosis not present

## 2017-08-11 DIAGNOSIS — M810 Age-related osteoporosis without current pathological fracture: Secondary | ICD-10-CM | POA: Diagnosis not present

## 2017-08-11 DIAGNOSIS — Z0000 Encounter for general adult medical examination without abnormal findings: Secondary | ICD-10-CM

## 2017-08-11 DIAGNOSIS — E785 Hyperlipidemia, unspecified: Secondary | ICD-10-CM

## 2017-08-11 DIAGNOSIS — Z23 Encounter for immunization: Secondary | ICD-10-CM | POA: Diagnosis not present

## 2017-08-11 DIAGNOSIS — R739 Hyperglycemia, unspecified: Secondary | ICD-10-CM

## 2017-08-11 LAB — COMPREHENSIVE METABOLIC PANEL
ALT: 20 U/L (ref 0–35)
AST: 19 U/L (ref 0–37)
Albumin: 5.1 g/dL (ref 3.5–5.2)
Alkaline Phosphatase: 62 U/L (ref 39–117)
BILIRUBIN TOTAL: 0.9 mg/dL (ref 0.2–1.2)
BUN: 12 mg/dL (ref 6–23)
CALCIUM: 9.4 mg/dL (ref 8.4–10.5)
CHLORIDE: 103 meq/L (ref 96–112)
CO2: 28 meq/L (ref 19–32)
Creatinine, Ser: 0.68 mg/dL (ref 0.40–1.20)
GFR: 92.48 mL/min (ref >60.00)
Glucose, Bld: 104 mg/dL — ABNORMAL HIGH (ref 70–99)
Potassium: 4.5 mEq/L (ref 3.5–5.1)
Sodium: 139 mEq/L (ref 135–145)
Total Protein: 7 g/dL (ref 6.0–8.3)

## 2017-08-11 LAB — CBC WITH DIFFERENTIAL/PLATELET
BASOS PCT: 0.8 % (ref 0.0–3.0)
Basophils Absolute: 0 10*3/uL (ref 0.0–0.1)
Eosinophils Absolute: 0.1 10*3/uL (ref 0.0–0.7)
Eosinophils Relative: 2.6 % (ref 0.0–5.0)
HEMATOCRIT: 42 % (ref 36.0–46.0)
Hemoglobin: 14 g/dL (ref 12.0–15.0)
LYMPHS PCT: 28.5 % (ref 12.0–46.0)
Lymphs Abs: 1.2 10*3/uL (ref 0.7–4.0)
MCHC: 33.4 g/dL (ref 30.0–36.0)
MCV: 92 fl (ref 78.0–100.0)
MONOS PCT: 8.4 % (ref 3.0–12.0)
Monocytes Absolute: 0.4 10*3/uL (ref 0.1–1.0)
NEUTROS ABS: 2.6 10*3/uL (ref 1.4–7.7)
Neutrophils Relative %: 59.7 % (ref 43.0–77.0)
PLATELETS: 199 10*3/uL (ref 150.0–400.0)
RBC: 4.56 Mil/uL (ref 3.87–5.11)
RDW: 13.4 % (ref 11.5–15.5)
WBC: 4.4 10*3/uL (ref 4.0–10.5)

## 2017-08-11 LAB — LIPID PANEL
CHOL/HDL RATIO: 3
CHOLESTEROL: 219 mg/dL — AB (ref 0–200)
HDL: 86.9 mg/dL (ref >39.00)
LDL CALC: 118 mg/dL — AB (ref 0–99)
NonHDL: 132.42
Triglycerides: 74 mg/dL (ref 0.0–149.0)
VLDL: 14.8 mg/dL (ref 0.0–40.0)

## 2017-08-11 LAB — VITAMIN B12: VITAMIN B 12: 473 pg/mL (ref 211–911)

## 2017-08-11 LAB — TSH: TSH: 2.4 u[IU]/mL (ref 0.35–4.50)

## 2017-08-11 LAB — HEMOGLOBIN A1C: Hgb A1c MFr Bld: 5.2 % (ref 4.6–6.5)

## 2017-08-11 MED ORDER — HYDROCHLOROTHIAZIDE 12.5 MG PO TABS: 12.5000 mg | ORAL_TABLET | Freq: Every day | ORAL | 3 refills | Status: DC

## 2017-08-11 NOTE — Assessment & Plan Note (Addendum)
Taking macrobid daily No UTI symptoms Last uti about 2 years Continue Macrobid daily

## 2017-08-11 NOTE — Assessment & Plan Note (Signed)
Taking calcium and vitamin Walking daily Wants to hold off on medication dexa due 2020

## 2017-08-11 NOTE — Assessment & Plan Note (Signed)
Check lipid panel  Continue daily statin Regular exercise and healthy diet encouraged  

## 2017-08-11 NOTE — Assessment & Plan Note (Signed)
Taking B12 daily Check B12 level 

## 2017-08-11 NOTE — Assessment & Plan Note (Signed)
Blood pressure has been elevated here the last few visits and on a rare occasion when she has had it checked elsewhere it was elevated Continue amlodipine 10 mg daily Try hydrochlorothiazide 12.5 mg daily Call with side effects Monitor blood pressure ideally CMP, TSH

## 2017-08-12 ENCOUNTER — Encounter: Payer: Self-pay | Admitting: Internal Medicine

## 2017-09-20 ENCOUNTER — Ambulatory Visit: Payer: BLUE CROSS/BLUE SHIELD

## 2017-10-04 ENCOUNTER — Other Ambulatory Visit: Payer: Self-pay | Admitting: Internal Medicine

## 2017-10-06 ENCOUNTER — Ambulatory Visit (INDEPENDENT_AMBULATORY_CARE_PROVIDER_SITE_OTHER): Payer: BLUE CROSS/BLUE SHIELD | Admitting: *Deleted

## 2017-10-06 DIAGNOSIS — Z23 Encounter for immunization: Secondary | ICD-10-CM

## 2017-11-25 ENCOUNTER — Other Ambulatory Visit: Payer: Self-pay | Admitting: Internal Medicine

## 2017-11-25 NOTE — Telephone Encounter (Signed)
[place] controlled substance database checked-last filled in April 2018  Uses infrequently.  Will refill.  Prescription sent to pharmacy.

## 2017-11-25 NOTE — Telephone Encounter (Signed)
Not on currently med list, [place] Controlled Substance Database checked. Last filled on 11/08/17 for #30 from a December RX. I dont see where we filled in December.

## 2018-01-05 ENCOUNTER — Telehealth: Payer: Self-pay | Admitting: Internal Medicine

## 2018-01-05 NOTE — Telephone Encounter (Signed)
She is up to date with all the basic immunizations.  A booster of measles is not recommended.    Not sure where she is going overseas - depending on the country there may be other recommendations

## 2018-01-05 NOTE — Telephone Encounter (Signed)
Spoke with pts Husband to inform.

## 2018-01-05 NOTE — Telephone Encounter (Signed)
Copied from [place] [phone] [phone]. Topic: Quick Communication - See Telephone Encounter >> Jan 05, 2018  9:35 AM Neva Seat wrote: Traveling on [facility] and overseas - needing to know if she needs any vaccines.   Also asking if she still needs the measles vaccine - she had them around 60 ys old. Please leave message on voice mail and or w/ husband if she isn't able to answer.

## 2018-01-13 ENCOUNTER — Other Ambulatory Visit: Payer: Self-pay | Admitting: Internal Medicine

## 2018-01-13 NOTE — Telephone Encounter (Signed)
Spoke with pt, states she takes the Ruth as a preventative for UTIs. States she has been taking this for years. Pt did have to reschedule 6 mo follow-up due to death in the family. Did reschedule for July.

## 2018-01-13 NOTE — Telephone Encounter (Signed)
Is this for an assumed UTI?  She needs to either be seen or drop off a urine - at the minimum needs to call and tell us what symptoms she is having

## 2018-03-01 ENCOUNTER — Ambulatory Visit: Payer: BLUE CROSS/BLUE SHIELD | Admitting: Internal Medicine

## 2018-03-13 NOTE — Progress Notes (Signed)
Subjective:    Patient ID: [name], female    DOB: 02-01-1951, 67 y.o.   MRN: [id number]  HPI The patient is here for follow up.  Hypertension: She is taking her medication daily. She is compliant with a low sodium diet.  She denies chest pain, palpitations, edema, shortness of breath and regular headaches. She is exercising regularly - walking.  She does monitor her blood pressure at home - 132/70, 134/65, 133/76, 145/76, 120/88 (dentist office).    Hyperlipidemia: She is taking her medication daily. She is compliant with a low fat/cholesterol diet. She is exercising regularly. She denies myalgias.   Insomnia, anxiety:  She takes xanax only as needed for insomnia or anxiety.   She rarely takes it.    Chronic left shoulder pain, chronic back pain:  She takes diclofenac once in a while.  She takes the muscle relaxer as needed.  She does neck traction and neck exercises.    Recurrent UTI:  She takes the macrobid daily to prevent UTI's.  She denies UTI symptoms.   Medications and allergies reviewed with patient and updated if appropriate.  Patient Active Problem List   Diagnosis Date Noted  . Shoulder pain, left 02/23/2017  . B12 deficiency 08/07/2016  . Recurrent UTI 11/08/2015  . Non-celiac gluten sensitivity   . Anxiety state 01/17/2010  . ALLERGIC RHINITIS 01/17/2010  . Dyslipidemia 07/12/2009  . Essential hypertension 07/12/2009  . Osteoporosis 06/22/2008  . 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 ANEMIA, PERNICIOUS 10/14/2007  . IRRITABLE BOWEL SYNDROME 10/14/2007    Current Outpatient Medications on File Prior to Visit  Medication Sig Dispense Refill  . ALPRAZolam (XANAX) 0.25 MG tablet TAKE 1 TABLET BY MOUTH AT BEDTIME AS NEEDED FOR ANXIETY 30 tablet 0  . amLODipine (NORVASC) 10 MG tablet TAKE 1 TABLET BY MOUTH EVERY DAY 90 tablet 1  . diclofenac (VOLTAREN) 75 MG EC tablet TAKE 1 TABLET (75 MG TOTAL) BY MOUTH 2 (TWO) TIMES DAILY. 180 tablet 1  .  hydrochlorothiazide (HYDRODIURIL) 12.5 MG tablet Take 1 tablet (12.5 mg total) by mouth daily. 90 tablet 3  . metaxalone (SKELAXIN) 800 MG tablet TAKE 1 TABLET (800 MG TOTAL) BY MOUTH EVERY 8 (EIGHT) HOURS AS NEEDED FOR MUSCLE SPASMS. 60 tablet 0  . nitrofurantoin, macrocrystal-monohydrate, (MACROBID) 100 MG capsule TAKE 1 CAPSULE BY MOUTH TWICE A DAY 180 capsule 1  . pravastatin (PRAVACHOL) 40 MG tablet Take 1 tablet (40 mg total) by mouth daily. 90 tablet 3  . vitamin B-12 (CYANOCOBALAMIN) 1000 MCG tablet Take 1 tablet (1,000 mcg total) by mouth daily.     No current facility-administered medications on file prior to visit.     Past Medical History:  Diagnosis Date  . ALLERGIC RHINITIS   . ANEMIA, PERNICIOUS   . ANXIETY   . BILIARY DYSKINESIA   . CARPAL TUNNEL SYNDROME, LEFT   . DYSLIPIDEMIA   . Headache(784.0)   . HYPERTENSION   . Irritable bowel syndrome   . Non-celiac gluten sensitivity   . OSTEOPENIA   . 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 APPENDECTOMY    . CARPAL TUNNEL RELEASE    . CESAREAN SECTION  74 & 83   x's 2   . CHOLECYSTECTOMY  2005   Dr. Dalbert Batman (Gallbladder dyskinesia)  . COLONOSCOPY    . DILATION AND CURETTAGE OF UTERUS    . LEFT HEART CATHETERIZATION WITH  CORONARY ANGIOGRAM N/A 05/19/2012   Procedure: LEFT HEART CATHETERIZATION WITH CORONARY ANGIOGRAM;  Surgeon: Hillary Bow, MD;  Location[facility] C[facility] LAB;  Service: Cardiovascular;  Laterality: N/A;  . ROTATOR CUFF REPAIR    . TONSILLECTOMY    . UPPER GASTROINTESTINAL ENDOSCOPY      Social History   Socioeconomic History  . Marital status: Married    Spouse name: Not on file  . Number of children: Not on file  . Years of education: Not on file  . Highest education level: Not on file  Occupational History  . Occupation: Building surveyor: [employer]  Social Needs  . Financial resource strain: Not on file  .  Food insecurity:    Worry: Not on file    Inability: Not on file  . Transportation needs:    Medical: Not on file    Non-medical: Not on file  Tobacco Use  . Smoking status: Never Smoker  . Smokeless tobacco: Never Used  Substance and Sexual Activity  . Alcohol use: Yes    Alcohol/week: 0.6 oz    Types: 1 Glasses of wine per week    Comment: occassional  . Drug use: No  . Sexual activity: Yes    Birth control/protection: Post-menopausal  Lifestyle  . Physical activity:    Days per week: Not on file    Minutes per session: Not on file  . Stress: Not on file  Relationships  . Social connections:    Talks on phone: Not on file    Gets together: Not on file    Attends religious service: Not on file    Active member of club or organization: Not on file    Attends meetings of clubs or organizations: Not on file    Relationship status: Not on file  Other Topics Concern  . Not on file  Social History Narrative   Married and lives with husband.    Dental hygienist - retired    Family History  Problem Relation Age of Onset  . Colitis Mother   . Heart disease Mother   . Hyperlipidemia Mother   . Lung cancer Mother        Cause of death  . Hypertension Mother   . Lung cancer Father   . Leukemia Brother   . Coronary artery disease Brother        MI in late 35's. Still living  . Ovarian cancer Maternal Aunt   . Diabetes Maternal Aunt   . Coronary artery disease Brother        MI in 23's. Also still living.  . Colon cancer Neg Hx   . Colon polyps Neg Hx     Review of Systems  Constitutional: Negative for fever.  Respiratory: Negative for cough, shortness of breath and wheezing.   Cardiovascular: Positive for palpitations (occ) and leg swelling (right ankle - from old injury). Negative for chest pain.  Neurological: Negative for light-headedness and headaches.       Objective:   Vitals:   03/15/18 0908  BP: (!) 150/84  Pulse: 68  Resp: 16  Temp: 98.5 F (36.9  C)  SpO2: 99%   BP Readings from Last 3 Encounters:  03/15/18 (!) 150/84  08/11/17 (!) 152/90  02/23/17 (!) 154/88   Wt Readings from Last 3 Encounters:  03/15/18 117 lb (53.1 kg)  08/11/17 123 lb (55.8 kg)  02/23/17 121 lb (54.9 kg)   Body mass index is 22.85 kg/m.  Physical Exam    Constitutional: Appears well-developed and well-nourished. No distress.  HENT:  Head: Normocephalic and atraumatic.  Neck: Neck supple. No tracheal deviation present. No thyromegaly present.  No cervical lymphadenopathy Cardiovascular: Normal rate, regular rhythm and normal heart sounds.   No murmur heard. No carotid bruit .  No edema Pulmonary/Chest: Effort normal and breath sounds normal. No respiratory distress. No has no wheezes. No rales.  Skin: Skin is warm and dry. Not diaphoretic.  Psychiatric: Normal mood and affect. Behavior is normal.      Assessment & Plan:    See Problem List for Assessment and Plan of chronic medical problems.

## 2018-03-13 NOTE — Patient Instructions (Addendum)
  Test(s) ordered today. Your results will be released to MyChart (or called to you) after review, usually within 72hours after test completion. If any changes need to be made, you will be notified at that same time.  Medications reviewed and updated.  No changes recommended at this time.    Please followup in 6 months   

## 2018-03-15 ENCOUNTER — Ambulatory Visit (INDEPENDENT_AMBULATORY_CARE_PROVIDER_SITE_OTHER): Payer: Medicare Other | Admitting: Internal Medicine

## 2018-03-15 ENCOUNTER — Encounter: Payer: Self-pay | Admitting: Internal Medicine

## 2018-03-15 VITALS — BP 150/84 | HR 68 | Temp 98.5°F | Resp 16 | Ht 60.0 in | Wt 117.0 lb

## 2018-03-15 DIAGNOSIS — M25512 Pain in left shoulder: Secondary | ICD-10-CM | POA: Diagnosis not present

## 2018-03-15 DIAGNOSIS — F411 Generalized anxiety disorder: Secondary | ICD-10-CM | POA: Diagnosis not present

## 2018-03-15 DIAGNOSIS — I10 Essential (primary) hypertension: Secondary | ICD-10-CM | POA: Diagnosis not present

## 2018-03-15 DIAGNOSIS — N390 Urinary tract infection, site not specified: Secondary | ICD-10-CM

## 2018-03-15 DIAGNOSIS — G8929 Other chronic pain: Secondary | ICD-10-CM | POA: Diagnosis not present

## 2018-03-15 DIAGNOSIS — E785 Hyperlipidemia, unspecified: Secondary | ICD-10-CM

## 2018-03-15 NOTE — Assessment & Plan Note (Signed)
BP elevated here today BP better controlled at home - will continue to monitor at home Discussed goal Continue current meds cmp today

## 2018-03-15 NOTE — Assessment & Plan Note (Signed)
Taking macrobid No UTI symptoms Continue macrobid

## 2018-03-15 NOTE — Assessment & Plan Note (Signed)
Chronic  Diclofenac as needed

## 2018-03-15 NOTE — Assessment & Plan Note (Signed)
Check lipid panel  Regular exercise and healthy diet encouraged  

## 2018-03-15 NOTE — Assessment & Plan Note (Addendum)
Taking xanax prn for anxiety and sleep Continue prn

## 2018-03-27 ENCOUNTER — Other Ambulatory Visit: Payer: Self-pay | Admitting: Internal Medicine

## 2018-04-13 ENCOUNTER — Other Ambulatory Visit: Payer: Self-pay | Admitting: Internal Medicine

## 2018-04-14 ENCOUNTER — Other Ambulatory Visit: Payer: Self-pay | Admitting: Internal Medicine

## 2018-05-12 ENCOUNTER — Telehealth: Payer: Self-pay | Admitting: Internal Medicine

## 2018-05-12 MED ORDER — ALPRAZOLAM 0.25 MG PO TABS: 0.2500 mg | ORAL_TABLET | Freq: Two times a day (BID) | ORAL | 0 refills | Status: DC | PRN

## 2018-05-12 NOTE — Telephone Encounter (Signed)
sent 

## 2018-05-12 NOTE — Telephone Encounter (Signed)
Copied from [place] [phone]. Topic: Quick Communication - See Telephone Encounter >> May 12, 2018 12:03 PM Ivar Drape wrote: CRM for notification. See Telephone encounter for: 05/12/18. Patient would like a refill on her ALPRAZolam (XANAX) 0.25 MG tablet medication and sent to her preferred pharmacy [facility] on [address]. Patient stated she just lost her granddaughter in a car accident and she needs this medication ASAP.

## 2018-05-12 NOTE — Telephone Encounter (Signed)
Please advise 

## 2018-05-12 NOTE — Telephone Encounter (Signed)
Xanax refill Last Refill:11/25/17 # 30 with 0 refill Last OV: 03/15/18 PCP: Dr. Quay Burow Pharmacy:[facility] on [address].

## 2018-06-04 NOTE — Progress Notes (Signed)
Subjective:    Patient ID: [name], female    DOB: 1951-07-23, 67 y.o.   MRN: [id number]  HPI Here for a welcome to medicare wellness exam and follow up of her chronic medical problems.   I have personally reviewed and have noted 1.The patient's medical and social history 2.Their use of alcohol, tobacco or illicit drugs 3.Their current medications and supplements 4.The patient's functional ability including ADL's, fall risks, home                 safety risk and hearing or visual impairment. 5.Diet and physical activities 6.Evidence for depression or mood disorders 7.Care team reviewed  -   Gyn - Dr Milta Deiters, Eye doctor - going to switch  Hypertension: She is taking her medication daily. She is compliant with a low sodium diet.  She denies chest pain, edema, shortness of breath and regular headaches. She is exercising regularly-walking.      Hyperlipidemia: She is taking her medication daily. She is compliant with a low fat/cholesterol diet. She is exercising regularly. She denies myalgias.   Anxiety: She is taking her Xanax daily as needed only.  Her granddaughter died suddenly in a MVA and over the past few weeks she is only taking the medication 3 times.. She denies any side effects from the medication. She feels her anxiety is well controlled and she is happy with her current dose of medication.  She does not feel that she needs a daily medication at this time.  She may call back at some point to request a list of therapists to speak with.   Are there smokers in your home (other than you)? No  Risk Factors Exercise:   Walking regulary Dietary issues discussed:   Well balanced, eating less sugars  Vitamin and supplement use:  Vitamin B12, vitamin C, vitamin d, magnesium  Opiod use:  none  Cardiac risk factors: advanced age, hypertension, hyperlipidemia.  Depression Screen  Have you felt down, depressed or  hopeless? Yes due to granddaughters death  Have you felt little interest or pleasure in doing things?  No  Activities of Daily Living In your present state of health, do you have any difficulty performing the following activities?:  Driving? No Managing money?  No Feeding yourself? No Getting from bed to chair? No Climbing a flight of stairs? No Preparing food and eating?: No Bathing or showering? No Getting dressed: No Getting to/using the toilet? No Moving around from place to place: No In the past year have you fallen or had a near fall?: yes - roller skating   Are you sexually active?  no  Do you have more than one partner?  No   Hearing Difficulties: No Do you often ask people to speak up or repeat themselves? No Do you experience ringing or noises in your ears? Yes  Do you have difficulty understanding soft or whispered voices? No Vision:              Any change in vision:  no             Up to date with eye exam:   yes  Memory:  Do you feel that you have a problem with memory? No  Do you often misplace items? No  Do you feel safe at home?  Yes  Cognitive Testing  Alert, Orientated? Yes  Normal Appearance? Yes  Recall of three objects?  Yes  Can perform simple calculations? Yes  Displays appropriate judgment?  Yes  Can read the correct time from a watch face? Yes   Advanced Directives have been discussed with the patient? Yes     Medications and allergies reviewed with patient and updated if appropriate.  Patient Active Problem List   Diagnosis Date Noted  . Shoulder pain, left 02/23/2017  . B12 deficiency 08/07/2016  . Recurrent UTI 11/08/2015  . Non-celiac gluten sensitivity   . Anxiety state 01/17/2010  . ALLERGIC RHINITIS 01/17/2010  . Dyslipidemia 07/12/2009  . Essential hypertension 07/12/2009  . Osteoporosis 06/22/2008  . 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 ANEMIA, PERNICIOUS 10/14/2007  . IRRITABLE BOWEL SYNDROME 10/14/2007     Current Outpatient Medications on File Prior to Visit  Medication Sig Dispense Refill  . ALPRAZolam (XANAX) 0.25 MG tablet Take 1 tablet (0.25 mg total) by mouth 2 (two) times daily as needed for anxiety. 60 tablet 0  . amLODipine (NORVASC) 10 MG tablet TAKE 1 TABLET BY MOUTH EVERY DAY 90 tablet 1  . diclofenac (VOLTAREN) 75 MG EC tablet TAKE 1 TABLET BY MOUTH TWICE A DAY 180 tablet 1  . hydrochlorothiazide (HYDRODIURIL) 12.5 MG tablet Take 1 tablet (12.5 mg total) by mouth daily. 90 tablet 3  . metaxalone (SKELAXIN) 800 MG tablet TAKE 1 TABLET (800 MG TOTAL) BY MOUTH EVERY 8 (EIGHT) HOURS AS NEEDED FOR MUSCLE SPASMS. 60 tablet 0  . nitrofurantoin, macrocrystal-monohydrate, (MACROBID) 100 MG capsule TAKE 1 CAPSULE BY MOUTH TWICE A DAY 180 capsule 1  . pravastatin (PRAVACHOL) 40 MG tablet TAKE 1 TABLET BY MOUTH EVERY DAY 90 tablet 1  . vitamin B-12 (CYANOCOBALAMIN) 1000 MCG tablet Take 1 tablet (1,000 mcg total) by mouth daily.     No current facility-administered medications on file prior to visit.     Past Medical History:  Diagnosis Date  . ALLERGIC RHINITIS   . ANEMIA, PERNICIOUS   . ANXIETY   . BILIARY DYSKINESIA   . CARPAL TUNNEL SYNDROME, LEFT   . DYSLIPIDEMIA   . Headache(784.0)   . HYPERTENSION   . Irritable bowel syndrome   . Non-celiac gluten sensitivity   . OSTEOPENIA   . 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 APPENDECTOMY    . CARPAL TUNNEL RELEASE    . CESAREAN SECTION  76 & 83   x's 2   . CHOLECYSTECTOMY  2005   Dr. Dalbert Batman (Gallbladder dyskinesia)  . COLONOSCOPY    . DILATION AND CURETTAGE OF UTERUS    . LEFT HEART CATHETERIZATION WITH CORONARY ANGIOGRAM N/A 05/19/2012   Procedure: LEFT HEART CATHETERIZATION WITH CORONARY ANGIOGRAM;  Surgeon: Hillary Bow, MD;  Location: [facility] CATH LAB;  Service: Cardiovascular;  Laterality: N/A;  . ROTATOR CUFF REPAIR    . TONSILLECTOMY    . UPPER  GASTROINTESTINAL ENDOSCOPY      Social History   Socioeconomic History  . Marital status: Married    Spouse name: Not on file  . Number of children: Not on file  . Years of education: Not on file  . Highest education level: Not on file  Occupational History  . Occupation: Building surveyor: [employer]  Social Needs  . Financial resource strain: Not on file  . Food insecurity:    Worry: Not on file    Inability: Not on file  . Transportation needs:    Medical: Not on file    Non-medical: Not on file  Tobacco Use  . Smoking status: Never Smoker  . Smokeless tobacco: Never Used  Substance and Sexual Activity  . Alcohol use: Yes    Alcohol/week: 1.0 standard drinks    Types: 1 Glasses of wine per week    Comment: occassional  . Drug use: No  . Sexual activity: Yes    Birth control/protection: Post-menopausal  Lifestyle  . Physical activity:    Days per week: Not on file    Minutes per session: Not on file  . Stress: Not on file  Relationships  . Social connections:    Talks on phone: Not on file    Gets together: Not on file    Attends religious service: Not on file    Active member of club or organization: Not on file    Attends meetings of clubs or organizations: Not on file    Relationship status: Not on file  Other Topics Concern  . Not on file  Social History Narrative   Married and lives with husband.    Dental hygienist - retired    Family History  Problem Relation Age of Onset  . Colitis Mother   . Heart disease Mother   . Hyperlipidemia Mother   . Lung cancer Mother        Cause of death  . Hypertension Mother   . Heart disease Brother   . Valvular heart disease Brother   . Heart attack Brother   . Atrial fibrillation Brother   . Heart failure Brother   . Miscarriages / [place] Brother   . Lung cancer Father   . Leukemia Brother   . Coronary artery disease Brother        MI in late 17's. Still living  . Ovarian cancer  Maternal Aunt   . Diabetes Maternal Aunt   . Coronary artery disease Brother        MI in 8's. Also still living.  . Colon cancer Neg Hx   . Colon polyps Neg Hx     Review of Systems  Constitutional: Negative for chills and fever.  HENT: Positive for tinnitus (mild, intermittent). Negative for hearing loss.   Eyes: Negative for visual disturbance.  Respiratory: Negative for cough, shortness of breath and wheezing.   Cardiovascular: Positive for palpitations (occ). Negative for chest pain and leg swelling.  Gastrointestinal: Positive for abdominal pain (occ - certain foods). Negative for blood in stool, constipation, diarrhea and nausea.       Occ gerd - zantac as needed  Genitourinary: Negative for dysuria and hematuria.  Musculoskeletal: Positive for back pain (mild, occ). Negative for arthralgias.  Skin: Negative for color change and rash.  Neurological: Positive for headaches (occ). Negative for light-headedness.  Psychiatric/Behavioral: Positive for dysphoric mood. The patient is nervous/anxious.        Objective:   Vitals:   06/06/18 1327  BP: (!) 146/82  Pulse: 65  Resp: 16  Temp: 97.8 F (36.6 C)  SpO2: 98%   Filed Weights   06/06/18 1327  Weight: 108 lb (49 kg)   Body mass index is 21.09 kg/m.  Wt Readings from Last 3 Encounters:  06/06/18 108 lb (49 kg)  03/15/18 117 lb (53.1 kg)  08/11/17 123 lb (55.8 kg)     Visual Acuity Screening   Right eye Left eye Both eyes  Without correction:     With correction: 20 15 20 15 20 15       Physical Exam Constitutional: She appears well-developed and well-nourished.  No distress.  HENT:  Head: Normocephalic and atraumatic.  Right Ear: External ear normal. Normal ear canal and TM Left Ear: External ear normal.  Normal ear canal and TM Mouth/Throat: Oropharynx is clear and moist.  Eyes: Conjunctivae and EOM are normal.  Neck: Neck supple. No tracheal deviation present. No thyromegaly present.  No carotid  bruit  Cardiovascular: Normal rate, regular rhythm and normal heart sounds.   No murmur heard.  No edema. Pulmonary/Chest: Effort normal and breath sounds normal. No respiratory distress. She has no wheezes. She has no rales.  Breast: deferred to Gyn Abdominal: Soft. She exhibits no distension. There is no tenderness.  Lymphadenopathy: She has no cervical adenopathy.  Skin: Skin is warm and dry. She is not diaphoretic.  Psychiatric: She has a normal mood and affect. Her behavior is normal.        Assessment & Plan:   Wellness Exam: Immunizations   Flu today,  prevnar declined , had shingrix Colonoscopy   Up to date  Mammogram   Up to date  Dexa    Up to date  Gyn   Up to date  Eye exam  Up to date  EKG today:  NSR at 65 bpm, negative t waves in precordial leads, probably normal - unchanged from prior EKGs - 2012 Hearing loss - some tinnitus on occasion, mild hearing loss- ? Memory concerns/difficulties  none Independent of ADLs   fully Stressed the importance of regular exercise   Patient received copy of preventative screening tests/immunizations recommended for the next 5-10 years.    See Problem List for Assessment and Plan of chronic medical problems.

## 2018-06-04 NOTE — Patient Instructions (Addendum)
  Ms. Pina , Thank you for taking time to come for your Medicare Wellness Visit. I appreciate your ongoing commitment to your health goals. Please review the following plan we discussed and let me know if I can assist you in the future.   These are the goals we discussed: Goals   None     This is a list of the screening recommended for you and due dates:  Health Maintenance  Topic Date Due  . Pneumonia vaccines (1 of 2 - PCV13) 04/01/2018  . Flu Shot  today  . Mammogram  02/16/2019  . DEXA scan (bone density measurement)  02/16/2019  . Pap Smear  02/16/2020  . Colon Cancer Screening  05/22/2025  . Tetanus Vaccine  11/07/2025  .  Hepatitis C: One time screening is recommended by Center for Disease Control  (CDC) for  adults born from [birth year] through 1963.   Completed  . HIV Screening  Completed      Tests ordered today. Your results will be released to [facility] (or called to you) after review, usually within 72hours after test completion. If any changes need to be made, you will be notified at that same time.  All other Health Maintenance issues reviewed.   All recommended immunizations and age-appropriate screenings are up-to-date or discussed.  Flu immunization administered today.    Medications reviewed and updated.  Changes include :   none  Your prescription(s) have been submitted to your pharmacy. Please take as directed and contact our office if you believe you are having problem(s) with the medication(s).   Please followup in 6 months

## 2018-06-06 ENCOUNTER — Other Ambulatory Visit (INDEPENDENT_AMBULATORY_CARE_PROVIDER_SITE_OTHER): Payer: Medicare Other

## 2018-06-06 ENCOUNTER — Ambulatory Visit (INDEPENDENT_AMBULATORY_CARE_PROVIDER_SITE_OTHER): Payer: Medicare Other | Admitting: Internal Medicine

## 2018-06-06 ENCOUNTER — Encounter: Payer: Self-pay | Admitting: Internal Medicine

## 2018-06-06 VITALS — BP 146/82 | HR 65 | Temp 97.8°F | Resp 16 | Ht 60.0 in | Wt 108.0 lb

## 2018-06-06 DIAGNOSIS — I10 Essential (primary) hypertension: Secondary | ICD-10-CM

## 2018-06-06 DIAGNOSIS — Z0000 Encounter for general adult medical examination without abnormal findings: Secondary | ICD-10-CM

## 2018-06-06 DIAGNOSIS — M25512 Pain in left shoulder: Secondary | ICD-10-CM

## 2018-06-06 DIAGNOSIS — E538 Deficiency of other specified B group vitamins: Secondary | ICD-10-CM | POA: Diagnosis not present

## 2018-06-06 DIAGNOSIS — N390 Urinary tract infection, site not specified: Secondary | ICD-10-CM | POA: Diagnosis not present

## 2018-06-06 DIAGNOSIS — M810 Age-related osteoporosis without current pathological fracture: Secondary | ICD-10-CM

## 2018-06-06 DIAGNOSIS — Z23 Encounter for immunization: Secondary | ICD-10-CM

## 2018-06-06 DIAGNOSIS — E785 Hyperlipidemia, unspecified: Secondary | ICD-10-CM

## 2018-06-06 DIAGNOSIS — G8929 Other chronic pain: Secondary | ICD-10-CM | POA: Diagnosis not present

## 2018-06-06 LAB — CBC WITH DIFFERENTIAL/PLATELET
BASOS PCT: 0.6 % (ref 0.0–3.0)
Basophils Absolute: 0 10*3/uL (ref 0.0–0.1)
EOS PCT: 0.7 % (ref 0.0–5.0)
Eosinophils Absolute: 0 10*3/uL (ref 0.0–0.7)
HCT: 39.8 % (ref 36.0–46.0)
Hemoglobin: 13.8 g/dL (ref 12.0–15.0)
Lymphocytes Relative: 24 % (ref 12.0–46.0)
Lymphs Abs: 1.2 10*3/uL (ref 0.7–4.0)
MCHC: 34.7 g/dL (ref 30.0–36.0)
MCV: 89.9 fl (ref 78.0–100.0)
MONO ABS: 0.3 10*3/uL (ref 0.1–1.0)
Monocytes Relative: 6.7 % (ref 3.0–12.0)
NEUTROS PCT: 68 % (ref 43.0–77.0)
Neutro Abs: 3.5 10*3/uL (ref 1.4–7.7)
PLATELETS: 219 10*3/uL (ref 150.0–400.0)
RBC: 4.43 Mil/uL (ref 3.87–5.11)
RDW: 13 % (ref 11.5–15.5)
WBC: 5.2 10*3/uL (ref 4.0–10.5)

## 2018-06-06 LAB — COMPREHENSIVE METABOLIC PANEL
ALBUMIN: 5.2 g/dL (ref 3.5–5.2)
ALT: 31 U/L (ref 0–35)
AST: 26 U/L (ref 0–37)
Alkaline Phosphatase: 59 U/L (ref 39–117)
BUN: 7 mg/dL (ref 6–23)
CALCIUM: 10.3 mg/dL (ref 8.4–10.5)
CHLORIDE: 99 meq/L (ref 96–112)
CO2: 29 mEq/L (ref 19–32)
Creatinine, Ser: 0.66 mg/dL (ref 0.40–1.20)
GFR: 95.48 mL/min (ref >60.00)
Glucose, Bld: 102 mg/dL — ABNORMAL HIGH (ref 70–99)
POTASSIUM: 4.3 meq/L (ref 3.5–5.1)
SODIUM: 137 meq/L (ref 135–145)
Total Bilirubin: 1 mg/dL (ref 0.2–1.2)
Total Protein: 7.3 g/dL (ref 6.0–8.3)

## 2018-06-06 LAB — TSH: TSH: 2.3 u[IU]/mL (ref 0.35–4.50)

## 2018-06-06 LAB — LIPID PANEL
CHOLESTEROL: 206 mg/dL — AB (ref 0–200)
HDL: 94.4 mg/dL (ref >39.00)
LDL CALC: 96 mg/dL (ref 0–99)
NonHDL: 111.36
Total CHOL/HDL Ratio: 2
Triglycerides: 78 mg/dL (ref 0.0–149.0)
VLDL: 15.6 mg/dL (ref 0.0–40.0)

## 2018-06-06 NOTE — Assessment & Plan Note (Signed)
Walking regularly DEXA up-to-date

## 2018-06-06 NOTE — Assessment & Plan Note (Signed)
Check lipid panel  Continue daily statin Regular exercise and healthy diet encouraged  

## 2018-06-06 NOTE — Assessment & Plan Note (Signed)
Takes Macrobid for prevention Tolerates it well Has prevented UTIs Continue

## 2018-06-06 NOTE — Assessment & Plan Note (Addendum)
BP Readings from Last 3 Encounters:  06/06/18 (!) 146/82  03/15/18 (!) 150/84  08/11/17 (!) 152/90   BP at home controlled, continue current medications at current dose She will continue to monitor at home [facility]

## 2018-06-06 NOTE — Assessment & Plan Note (Signed)
Taking B12 daily, continue

## 2018-06-07 ENCOUNTER — Encounter: Payer: Self-pay | Admitting: Internal Medicine

## 2018-07-19 ENCOUNTER — Other Ambulatory Visit: Payer: Self-pay | Admitting: Internal Medicine

## 2018-08-10 NOTE — Progress Notes (Signed)
`   Subjective:    Patient ID: [name], female    DOB: [birth year], 67 y.o.   MRN: [id number]  HPI The patient is here for an acute visit for lower abdominal pain and change in bowel habits.   4 nights ago she started having mild achiness across her lower abdomen.  She described it as a cramping sensation and pain that was sharp at times.  She took in an antacid.  Her pain continued to worsen over the next few days.  Last night she did take a diclofenac and that helped a little.  She has had pain with trying to have a bowel movement and her stools have been loose.  She denies constipation.  She does not feel like she is having a complete bowel movement.  She does have decreased appetite and has not been eating as much.  She denies any blood in the stool, but has seen some mucus.  She has been experiencing low-grade fevers at home 99-100.5.  When her bladder gets full she has had increased pain, which improves after she urinates.  She denies any dysuria or hematuria.  She does have a sensitive feeling from the skin in her lower back all the way to the top of her head.  She is unsure if that is related or not.  Her last colonoscopy was 3 years ago and she does have mild diverticulosis in the sigmoid colon.  She has never had an episode of diverticulitis.  She does recall that she ate a lot of pecans at Thanksgiving 1 week ago.  She does have a history of recurrent UTIs and is on preventative medication with nitrofurantoin daily.  Medications and allergies reviewed with patient and updated if appropriate.  Patient Active Problem List   Diagnosis Date Noted  . Shoulder pain, left 02/23/2017  . B12 deficiency 08/07/2016  . Recurrent UTI 11/08/2015  . Non-celiac gluten sensitivity   . Anxiety state 01/17/2010  . ALLERGIC RHINITIS 01/17/2010  . Dyslipidemia 07/12/2009  . Essential hypertension 07/12/2009  . Osteoporosis 06/22/2008  . 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  ANEMIA, PERNICIOUS 10/14/2007  . IRRITABLE BOWEL SYNDROME 10/14/2007    Current Outpatient Medications on File Prior to Visit  Medication Sig Dispense Refill  . ALPRAZolam (XANAX) 0.25 MG tablet Take 1 tablet (0.25 mg total) by mouth 2 (two) times daily as needed for anxiety. 60 tablet 0  . amLODipine (NORVASC) 10 MG tablet TAKE 1 TABLET BY MOUTH EVERY DAY 90 tablet 1  . diclofenac (VOLTAREN) 75 MG EC tablet TAKE 1 TABLET BY MOUTH TWICE A DAY 180 tablet 1  . hydrochlorothiazide (HYDRODIURIL) 12.5 MG tablet TAKE 1 TABLET BY MOUTH EVERY DAY 90 tablet 0  . metaxalone (SKELAXIN) 800 MG tablet TAKE 1 TABLET (800 MG TOTAL) BY MOUTH EVERY 8 (EIGHT) HOURS AS NEEDED FOR MUSCLE SPASMS. 60 tablet 0  . nitrofurantoin, macrocrystal-monohydrate, (MACROBID) 100 MG capsule TAKE 1 CAPSULE BY MOUTH TWICE A DAY 180 capsule 1  . pravastatin (PRAVACHOL) 40 MG tablet TAKE 1 TABLET BY MOUTH EVERY DAY 90 tablet 1  . vitamin B-12 (CYANOCOBALAMIN) 1000 MCG tablet Take 1 tablet (1,000 mcg total) by mouth daily.     No current facility-administered medications on file prior to visit.     Past Medical History:  Diagnosis Date  . ALLERGIC RHINITIS   . ANEMIA, PERNICIOUS   . ANXIETY   . BILIARY DYSKINESIA   . CARPAL TUNNEL SYNDROME, LEFT   .  DYSLIPIDEMIA   . Headache(784.0)   . HYPERTENSION   . Irritable bowel syndrome   . Non-celiac gluten sensitivity   . OSTEOPENIA   . 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 APPENDECTOMY    . CARPAL TUNNEL RELEASE    . CESAREAN SECTION  74 & 83   x's 2   . CHOLECYSTECTOMY  2005   Dr. Dalbert Batman (Gallbladder dyskinesia)  . COLONOSCOPY    . DILATION AND CURETTAGE OF UTERUS    . LEFT HEART CATHETERIZATION WITH CORONARY ANGIOGRAM N/A 05/19/2012   Procedure: LEFT HEART CATHETERIZATION WITH CORONARY ANGIOGRAM;  Surgeon: Hillary Bow, MD;  Location: [facility] CATH LAB;  Service: Cardiovascular;  Laterality: N/A;    . ROTATOR CUFF REPAIR    . TONSILLECTOMY    . UPPER GASTROINTESTINAL ENDOSCOPY      Social History   Socioeconomic History  . Marital status: Married    Spouse name: Not on file  . Number of children: Not on file  . Years of education: Not on file  . Highest education level: Not on file  Occupational History  . Occupation: Building surveyor: [employer]  Social Needs  . Financial resource strain: Not on file  . Food insecurity:    Worry: Not on file    Inability: Not on file  . Transportation needs:    Medical: Not on file    Non-medical: Not on file  Tobacco Use  . Smoking status: Never Smoker  . Smokeless tobacco: Never Used  Substance and Sexual Activity  . Alcohol use: Yes    Alcohol/week: 1.0 standard drinks    Types: 1 Glasses of wine per week    Comment: occassional  . Drug use: No  . Sexual activity: Yes    Birth control/protection: Post-menopausal  Lifestyle  . Physical activity:    Days per week: Not on file    Minutes per session: Not on file  . Stress: Not on file  Relationships  . Social connections:    Talks on phone: Not on file    Gets together: Not on file    Attends religious service: Not on file    Active member of club or organization: Not on file    Attends meetings of clubs or organizations: Not on file    Relationship status: Not on file  Other Topics Concern  . Not on file  Social History Narrative   Married and lives with husband.    Dental hygienist - retired    Family History  Problem Relation Age of Onset  . Colitis Mother   . Heart disease Mother   . Hyperlipidemia Mother   . Lung cancer Mother        Cause of death  . Hypertension Mother   . Heart disease Brother   . Valvular heart disease Brother   . Heart attack Brother   . Atrial fibrillation Brother   . Heart failure Brother   . Miscarriages / [place] Brother   . Lung cancer Father   . Leukemia Brother   . Coronary artery disease Brother         MI in late 72's. Still living  . Ovarian cancer Maternal Aunt   . Diabetes Maternal Aunt   . Coronary artery disease Brother        MI in 12's. Also still living.  . Colon cancer Neg Hx   .  Colon polyps Neg Hx     Review of Systems  Constitutional: Positive for appetite change (decreased) and fever (low grade).  Gastrointestinal: Positive for abdominal pain (across lower abdomen). Negative for blood in stool, nausea and vomiting.  Genitourinary: Negative for difficulty urinating, dysuria, frequency, hematuria and urgency.       No change in urine color or odor  Neurological: Positive for light-headedness. Negative for headaches.       Objective:   Vitals:   08/11/18 0944  BP: 130/72  Pulse: 100  Temp: 99.1 F (37.3 C)   BP Readings from Last 3 Encounters:  08/11/18 130/72  06/06/18 (!) 146/82  03/15/18 (!) 150/84   Wt Readings from Last 3 Encounters:  08/11/18 108 lb 12.8 oz (49.4 kg)  06/06/18 108 lb (49 kg)  03/15/18 117 lb (53.1 kg)   Body mass index is 21.25 kg/m.   Physical Exam  Constitutional: She appears well-developed and well-nourished.  Non-toxic appearance. She does not appear ill. No distress.  HENT:  Head: Normocephalic and atraumatic.  Cardiovascular: Normal rate and regular rhythm.  Pulmonary/Chest: Effort normal and breath sounds normal.  Abdominal: Soft. Normal appearance and bowel sounds are normal. There is no hepatosplenomegaly. There is tenderness (across lower abdomen - rated as 9.5/10). There is guarding. There is no rigidity and no rebound. No hernia.  Skin: Skin is warm and dry.           Assessment & Plan:    See Problem List for Assessment and Plan of chronic medical problems.

## 2018-08-11 ENCOUNTER — Telehealth: Payer: Self-pay | Admitting: Internal Medicine

## 2018-08-11 ENCOUNTER — Other Ambulatory Visit: Payer: Self-pay

## 2018-08-11 ENCOUNTER — Inpatient Hospital Stay (HOSPITAL_COMMUNITY)
Admission: EM | Admit: 2018-08-11 | Discharge: 2018-08-14 | DRG: 872 | Disposition: A | Discharge status: Home / Self Care | Payer: Medicare Other | Attending: Family Medicine | Admitting: Family Medicine

## 2018-08-11 ENCOUNTER — Encounter: Payer: Self-pay | Admitting: Internal Medicine

## 2018-08-11 ENCOUNTER — Encounter (HOSPITAL_COMMUNITY): Payer: Self-pay

## 2018-08-11 ENCOUNTER — Other Ambulatory Visit (INDEPENDENT_AMBULATORY_CARE_PROVIDER_SITE_OTHER): Payer: Medicare Other

## 2018-08-11 ENCOUNTER — Encounter

## 2018-08-11 ENCOUNTER — Ambulatory Visit (INDEPENDENT_AMBULATORY_CARE_PROVIDER_SITE_OTHER): Payer: Medicare Other | Admitting: Internal Medicine

## 2018-08-11 ENCOUNTER — Ambulatory Visit
Admission: RE | Admit: 2018-08-11 | Discharge: 2018-08-11 | Disposition: A | Discharge status: Home / Self Care | Payer: Medicare Other | Source: Ambulatory Visit | Attending: Internal Medicine | Admitting: Internal Medicine

## 2018-08-11 VITALS — BP 130/72 | HR 100 | Temp 99.1°F | Wt 108.8 lb

## 2018-08-11 DIAGNOSIS — Z806 Family history of leukemia: Secondary | ICD-10-CM

## 2018-08-11 DIAGNOSIS — E876 Hypokalemia: Secondary | ICD-10-CM | POA: Diagnosis present

## 2018-08-11 DIAGNOSIS — I10 Essential (primary) hypertension: Secondary | ICD-10-CM | POA: Diagnosis present

## 2018-08-11 DIAGNOSIS — F419 Anxiety disorder, unspecified: Secondary | ICD-10-CM | POA: Diagnosis present

## 2018-08-11 DIAGNOSIS — A419 Sepsis, unspecified organism: Secondary | ICD-10-CM | POA: Diagnosis present

## 2018-08-11 DIAGNOSIS — K5900 Constipation, unspecified: Secondary | ICD-10-CM | POA: Diagnosis present

## 2018-08-11 DIAGNOSIS — R1030 Lower abdominal pain, unspecified: Secondary | ICD-10-CM

## 2018-08-11 DIAGNOSIS — Z888 Allergy status to other drugs, medicaments and biological substances status: Secondary | ICD-10-CM

## 2018-08-11 DIAGNOSIS — K5730 Diverticulosis of large intestine without perforation or abscess without bleeding: Secondary | ICD-10-CM | POA: Diagnosis not present

## 2018-08-11 DIAGNOSIS — R198 Other specified symptoms and signs involving the digestive system and abdomen: Secondary | ICD-10-CM | POA: Insufficient documentation

## 2018-08-11 DIAGNOSIS — K9041 Non-celiac gluten sensitivity: Secondary | ICD-10-CM | POA: Diagnosis present

## 2018-08-11 DIAGNOSIS — Z8249 Family history of ischemic heart disease and other diseases of the circulatory system: Secondary | ICD-10-CM | POA: Diagnosis not present

## 2018-08-11 DIAGNOSIS — D802 Selective deficiency of immunoglobulin A [IgA]: Secondary | ICD-10-CM | POA: Diagnosis present

## 2018-08-11 DIAGNOSIS — Z8041 Family history of malignant neoplasm of ovary: Secondary | ICD-10-CM

## 2018-08-11 DIAGNOSIS — E871 Hypo-osmolality and hyponatremia: Secondary | ICD-10-CM | POA: Diagnosis present

## 2018-08-11 DIAGNOSIS — Z9049 Acquired absence of other specified parts of digestive tract: Secondary | ICD-10-CM

## 2018-08-11 DIAGNOSIS — Z885 Allergy status to narcotic agent status: Secondary | ICD-10-CM

## 2018-08-11 DIAGNOSIS — Z8349 Family history of other endocrine, nutritional and metabolic diseases: Secondary | ICD-10-CM | POA: Diagnosis not present

## 2018-08-11 DIAGNOSIS — Z801 Family history of malignant neoplasm of trachea, bronchus and lung: Secondary | ICD-10-CM | POA: Diagnosis not present

## 2018-08-11 DIAGNOSIS — M8580 Other specified disorders of bone density and structure, unspecified site: Secondary | ICD-10-CM | POA: Diagnosis present

## 2018-08-11 DIAGNOSIS — D649 Anemia, unspecified: Secondary | ICD-10-CM | POA: Diagnosis present

## 2018-08-11 DIAGNOSIS — Z833 Family history of diabetes mellitus: Secondary | ICD-10-CM | POA: Diagnosis not present

## 2018-08-11 DIAGNOSIS — K651 Peritoneal abscess: Secondary | ICD-10-CM | POA: Diagnosis not present

## 2018-08-11 DIAGNOSIS — E860 Dehydration: Secondary | ICD-10-CM | POA: Diagnosis present

## 2018-08-11 DIAGNOSIS — E785 Hyperlipidemia, unspecified: Secondary | ICD-10-CM | POA: Diagnosis not present

## 2018-08-11 DIAGNOSIS — K5792 Diverticulitis of intestine, part unspecified, without perforation or abscess without bleeding: Secondary | ICD-10-CM | POA: Diagnosis not present

## 2018-08-11 DIAGNOSIS — K5720 Diverticulitis of large intestine with perforation and abscess without bleeding: Secondary | ICD-10-CM | POA: Diagnosis not present

## 2018-08-11 LAB — CBC
HCT: 37.7 % (ref 36.0–46.0)
Hemoglobin: 13 g/dL (ref 12.0–15.0)
MCH: 31.9 pg (ref 26.0–34.0)
MCHC: 34.5 g/dL (ref 30.0–36.0)
MCV: 92.4 fL (ref 80.0–100.0)
NRBC: 0 % (ref 0.0–0.2)
Platelets: 211 10*3/uL (ref 150–400)
RBC: 4.08 MIL/uL (ref 3.87–5.11)
RDW: 12.1 % (ref 11.5–15.5)
WBC: 18.8 10*3/uL — ABNORMAL HIGH (ref 4.0–10.5)

## 2018-08-11 LAB — URINALYSIS, ROUTINE W REFLEX MICROSCOPIC
BILIRUBIN URINE: NEGATIVE
Bacteria, UA: NONE SEEN
Glucose, UA: NEGATIVE mg/dL
KETONES UR: 40 — AB
Ketones, ur: 5 mg/dL — AB
LEUKOCYTES UA: NEGATIVE
Nitrite: NEGATIVE
Nitrite: NEGATIVE
PH: 7 (ref 5.0–8.0)
Protein, ur: NEGATIVE mg/dL
SPECIFIC GRAVITY, URINE: 1.026 (ref 1.005–1.030)
Specific Gravity, Urine: <=1.005 — AB (ref 1.000–1.030)
TOTAL PROTEIN, URINE-UPE24: NEGATIVE
URINE GLUCOSE: NEGATIVE
UROBILINOGEN UA: 0.2 (ref 0.0–1.0)
pH: 7 (ref 5.0–8.0)

## 2018-08-11 LAB — COMPREHENSIVE METABOLIC PANEL
ALBUMIN: 4.5 g/dL (ref 3.5–5.2)
ALT: 20 U/L (ref 0–35)
ALT: 24 U/L (ref 0–44)
AST: 15 U/L (ref 0–37)
AST: 23 U/L (ref 15–41)
Albumin: 4.5 g/dL (ref 3.5–5.0)
Alkaline Phosphatase: 66 U/L (ref 39–117)
Alkaline Phosphatase: 68 U/L (ref 38–126)
Anion gap: 13 (ref 5–15)
BUN: 7 mg/dL (ref 6–23)
BUN: 8 mg/dL (ref 8–23)
CALCIUM: 9.5 mg/dL (ref 8.4–10.5)
CHLORIDE: 92 meq/L — AB (ref 96–112)
CO2: 27 mEq/L (ref 19–32)
CO2: 26 mmol/L (ref 22–32)
Calcium: 9 mg/dL (ref 8.9–10.3)
Chloride: 90 mmol/L — ABNORMAL LOW (ref 98–111)
Creatinine, Ser: 0.71 mg/dL (ref 0.40–1.20)
Creatinine, Ser: 0.73 mg/dL (ref 0.44–1.00)
GFR calc non Af Amer: >60 mL/min (ref >60)
GFR: 87.71 mL/min (ref >60.00)
Glucose, Bld: 113 mg/dL — ABNORMAL HIGH (ref 70–99)
Glucose, Bld: 155 mg/dL — ABNORMAL HIGH (ref 70–99)
POTASSIUM: 3.7 meq/L (ref 3.5–5.1)
Potassium: 2.9 mmol/L — ABNORMAL LOW (ref 3.5–5.1)
SODIUM: 129 mmol/L — AB (ref 135–145)
Sodium: 129 mEq/L — ABNORMAL LOW (ref 135–145)
Total Bilirubin: 1.3 mg/dL — ABNORMAL HIGH (ref 0.2–1.2)
Total Bilirubin: 1.2 mg/dL (ref 0.3–1.2)
Total Protein: 7.1 g/dL (ref 6.0–8.3)
Total Protein: 7.3 g/dL (ref 6.5–8.1)

## 2018-08-11 LAB — CBC WITH DIFFERENTIAL/PLATELET
Basophils Absolute: 0.1 10*3/uL (ref 0.0–0.1)
Basophils Relative: 0.4 % (ref 0.0–3.0)
Eosinophils Absolute: 0 10*3/uL (ref 0.0–0.7)
Eosinophils Relative: 0.1 % (ref 0.0–5.0)
HCT: 38.4 % (ref 36.0–46.0)
Hemoglobin: 13.1 g/dL (ref 12.0–15.0)
Lymphocytes Relative: 3.7 % — ABNORMAL LOW (ref 12.0–46.0)
Lymphs Abs: 0.6 10*3/uL — ABNORMAL LOW (ref 0.7–4.0)
MCHC: 34 g/dL (ref 30.0–36.0)
MCV: 91.3 fl (ref 78.0–100.0)
Monocytes Absolute: 1.2 10*3/uL — ABNORMAL HIGH (ref 0.1–1.0)
Monocytes Relative: 7.2 % (ref 3.0–12.0)
Neutro Abs: 14.8 10*3/uL — ABNORMAL HIGH (ref 1.4–7.7)
Neutrophils Relative %: 88.6 % — ABNORMAL HIGH (ref 43.0–77.0)
Platelets: 202 10*3/uL (ref 150.0–400.0)
RBC: 4.2 Mil/uL (ref 3.87–5.11)
RDW: 12.6 % (ref 11.5–15.5)
WBC: 16.7 10*3/uL — ABNORMAL HIGH (ref 4.0–10.5)

## 2018-08-11 LAB — LIPASE, BLOOD: LIPASE: 26 U/L (ref 11–51)

## 2018-08-11 MED ORDER — ALPRAZOLAM 0.25 MG PO TABS: 0.2500 mg | ORAL_TABLET | Freq: Two times a day (BID) | ORAL | Status: DC | PRN

## 2018-08-11 MED ORDER — ONDANSETRON HCL 4 MG/2ML IJ SOLN: 4.0000 mg | Freq: Four times a day (QID) | INTRAMUSCULAR | Status: DC | PRN

## 2018-08-11 MED ORDER — METRONIDAZOLE IN NACL 5-0.79 MG/ML-% IV SOLN
500.0000 mg | Freq: Once | INTRAVENOUS | Status: AC
  Administered 2018-08-11: 500 mg via INTRAVENOUS
  Filled 2018-08-11: qty 100

## 2018-08-11 MED ORDER — DEXTROSE-NACL 5-0.9 % IV SOLN
INTRAVENOUS | Status: DC
  Administered 2018-08-11 – 2018-08-13 (×3): via INTRAVENOUS

## 2018-08-11 MED ORDER — ENOXAPARIN SODIUM 40 MG/0.4ML ~~LOC~~ SOLN
40.0000 mg | SUBCUTANEOUS | Status: DC
  Administered 2018-08-11 – 2018-08-13 (×3): 40 mg via SUBCUTANEOUS
  Filled 2018-08-11 (×3): qty 0.4

## 2018-08-11 MED ORDER — ACETAMINOPHEN 650 MG RE SUPP: 650.0000 mg | Freq: Four times a day (QID) | RECTAL | Status: DC | PRN

## 2018-08-11 MED ORDER — PIPERACILLIN-TAZOBACTAM 3.375 G IVPB 30 MIN: 3.3750 g | Freq: Four times a day (QID) | INTRAVENOUS | Status: DC

## 2018-08-11 MED ORDER — PIPERACILLIN-TAZOBACTAM 3.375 G IVPB 30 MIN
3.3750 g | INTRAVENOUS | Status: AC
  Administered 2018-08-11: 3.375 g via INTRAVENOUS
  Filled 2018-08-11: qty 50

## 2018-08-11 MED ORDER — ONDANSETRON HCL 4 MG/2ML IJ SOLN
4.0000 mg | Freq: Once | INTRAMUSCULAR | Status: AC
  Administered 2018-08-11: 4 mg via INTRAVENOUS
  Filled 2018-08-11: qty 2

## 2018-08-11 MED ORDER — FAMOTIDINE IN NACL 20-0.9 MG/50ML-% IV SOLN
20.0000 mg | INTRAVENOUS | Status: DC
  Administered 2018-08-12 (×2): 20 mg via INTRAVENOUS
  Filled 2018-08-11 (×2): qty 50

## 2018-08-11 MED ORDER — POTASSIUM CHLORIDE 10 MEQ/100ML IV SOLN
10.0000 meq | INTRAVENOUS | Status: AC
  Administered 2018-08-11 – 2018-08-12 (×5): 10 meq via INTRAVENOUS
  Filled 2018-08-11 (×5): qty 100

## 2018-08-11 MED ORDER — FENTANYL CITRATE (PF) 100 MCG/2ML IJ SOLN: 50.0000 ug | INTRAMUSCULAR | Status: DC | PRN

## 2018-08-11 MED ORDER — IOPAMIDOL (ISOVUE-300) INJECTION 61%
100.0000 mL | Freq: Once | INTRAVENOUS | Status: AC | PRN
  Administered 2018-08-11: 100 mL via INTRAVENOUS

## 2018-08-11 MED ORDER — ACETAMINOPHEN 325 MG PO TABS
650.0000 mg | ORAL_TABLET | Freq: Four times a day (QID) | ORAL | Status: DC | PRN
  Administered 2018-08-11 – 2018-08-14 (×4): 650 mg via ORAL
  Filled 2018-08-11 (×4): qty 2

## 2018-08-11 MED ORDER — CIPROFLOXACIN IN D5W 400 MG/200ML IV SOLN
400.0000 mg | Freq: Once | INTRAVENOUS | Status: DC
  Filled 2018-08-11: qty 200

## 2018-08-11 MED ORDER — METAXALONE 800 MG PO TABS
800.0000 mg | ORAL_TABLET | Freq: Three times a day (TID) | ORAL | Status: DC | PRN
  Filled 2018-08-11: qty 1

## 2018-08-11 MED ORDER — ONDANSETRON HCL 4 MG PO TABS: 4.0000 mg | ORAL_TABLET | Freq: Four times a day (QID) | ORAL | Status: DC | PRN

## 2018-08-11 MED ORDER — MORPHINE SULFATE (PF) 2 MG/ML IV SOLN: 1.0000 mg | INTRAVENOUS | Status: DC | PRN

## 2018-08-11 MED ORDER — CALCIUM CARBONATE-VITAMIN D 500-200 MG-UNIT PO TABS
1.0000 | ORAL_TABLET | Freq: Every day | ORAL | Status: DC
  Administered 2018-08-12 – 2018-08-14 (×3): 1 via ORAL
  Filled 2018-08-11 (×3): qty 1

## 2018-08-11 MED ORDER — PRAVASTATIN SODIUM 20 MG PO TABS
40.0000 mg | ORAL_TABLET | Freq: Every day | ORAL | Status: DC
  Administered 2018-08-11 – 2018-08-14 (×4): 40 mg via ORAL
  Filled 2018-08-11 (×5): qty 2

## 2018-08-11 MED ORDER — VITAMIN D 25 MCG (1000 UNIT) PO TABS
1000.0000 [IU] | ORAL_TABLET | Freq: Every day | ORAL | Status: DC
  Administered 2018-08-11 – 2018-08-14 (×4): 1000 [IU] via ORAL
  Filled 2018-08-11 (×4): qty 1

## 2018-08-11 MED ORDER — PIPERACILLIN-TAZOBACTAM 3.375 G IVPB
3.3750 g | Freq: Three times a day (TID) | INTRAVENOUS | Status: DC
  Administered 2018-08-12 – 2018-08-13 (×5): 3.375 g via INTRAVENOUS
  Filled 2018-08-11 (×5): qty 50

## 2018-08-11 NOTE — ED Provider Notes (Signed)
[facility] DEPT Provider Note   CSN: [id number] Arrival date & time: 08/11/18  1258     History   Chief Complaint Chief Complaint  Patient presents with  . Abdominal Pain    HPI [name] is a 67 y.o. female.  Patient with past medical history remarkable for IBS, hypertension, hyperlipidemia, presents to the emergency department with a chief complaint of abdominal pain.  She states that the pain started on Sunday night (4 days ago), and has gradually worsened.  She states that she saw her primary care doctor this morning, who ordered a CT of the abdomen.  The CT showed diverticulitis with 2 small abscesses and possible contained perforation.  She was referred by her doctor to the emergency department for further evaluation care.  She reports that her pain is about a 2 out of 10 as long she is not moving.  She reports mild elevated temperature at home of 100.1.  She reports having some loose stool; non-bloody. She denies any other associated symptoms.  The history is provided by the patient. No language interpreter was used.    Past Medical History:  Diagnosis Date  . ALLERGIC RHINITIS   . ANEMIA, PERNICIOUS   . ANXIETY   . BILIARY DYSKINESIA   . CARPAL TUNNEL SYNDROME, LEFT   . DYSLIPIDEMIA   . Headache(784.0)   . HYPERTENSION   . Irritable bowel syndrome   . Non-celiac gluten sensitivity   . OSTEOPENIA   . 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 Change in bowel function 08/11/2018  . Lower abdominal pain 08/11/2018  . Shoulder pain, left 02/23/2017  . B12 deficiency 08/07/2016  . Recurrent UTI 11/08/2015  . Non-celiac gluten sensitivity   . Anxiety state 01/17/2010  . ALLERGIC RHINITIS 01/17/2010  . Dyslipidemia 07/12/2009  . Essential hypertension 07/12/2009  . Osteoporosis 06/22/2008  . 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 ANEMIA, PERNICIOUS  10/14/2007  . IRRITABLE BOWEL SYNDROME 10/14/2007    Past Surgical History:  Procedure Laterality Date  . APPENDECTOMY    . CARPAL TUNNEL RELEASE    . CESAREAN SECTION  76 & 85   x's 2   . CHOLECYSTECTOMY  2005   Dr. Dalbert Batman (Gallbladder dyskinesia)  . COLONOSCOPY    . DILATION AND CURETTAGE OF UTERUS    . LEFT HEART CATHETERIZATION WITH CORONARY ANGIOGRAM N/A 05/19/2012   Procedure: LEFT HEART CATHETERIZATION WITH CORONARY ANGIOGRAM;  Surgeon: Hillary Bow, MD;  Location: [facility] CATH LAB;  Service: Cardiovascular;  Laterality: N/A;  . ROTATOR CUFF REPAIR    . TONSILLECTOMY    . UPPER GASTROINTESTINAL ENDOSCOPY       OB History   None      Home Medications    Prior to Admission medications   Medication Sig Start Date End Date Taking? Authorizing Provider  ALPRAZolam (XANAX) 0.25 MG tablet Take 1 tablet (0.25 mg total) by mouth 2 (two) times daily as needed for anxiety. 05/12/18  Yes Burns, Claudina Lick, MD  amLODipine (NORVASC) 10 MG tablet TAKE 1 TABLET BY MOUTH EVERY DAY 03/28/18  Yes Burns, Claudina Lick, MD  calcium-vitamin D (OSCAL WITH D) 500-200 MG-UNIT tablet Take 1 tablet by mouth daily with breakfast.   Yes [provider]  cholecalciferol (VITAMIN D3) 25 MCG (1000 UT) tablet Take 1,000 Units by mouth daily.   Yes [provider]  diclofenac (VOLTAREN) 75  MG EC tablet TAKE 1 TABLET BY MOUTH TWICE A DAY Patient taking differently: Take by mouth 2 (two) times daily as needed for mild pain.  04/14/18  Yes Burns, Claudina Lick, MD  hydrochlorothiazide (HYDRODIURIL) 12.5 MG tablet TAKE 1 TABLET BY MOUTH EVERY DAY 07/19/18  Yes Burns, Claudina Lick, MD  Ibuprofen-diphenhydrAMINE HCl (IBUPROFEN PM) 200-25 MG CAPS Take 1 tablet by mouth at bedtime as needed (pain/sleep).   Yes [provider]  metaxalone (SKELAXIN) 800 MG tablet TAKE 1 TABLET (800 MG TOTAL) BY MOUTH EVERY 8 (EIGHT) HOURS AS NEEDED FOR MUSCLE SPASMS. Patient taking differently: Take 400-800 mg by mouth every 8  (eight) hours as needed for muscle spasms.  04/15/17  Yes Burns, Claudina Lick, MD  nitrofurantoin, macrocrystal-monohydrate, (MACROBID) 100 MG capsule TAKE 1 CAPSULE BY MOUTH TWICE A DAY 01/13/18  Yes Burns, Claudina Lick, MD  pravastatin (PRAVACHOL) 40 MG tablet TAKE 1 TABLET BY MOUTH EVERY DAY 04/13/18  Yes Burns, Claudina Lick, MD  pseudoephedrine (SUDAFED) 30 MG tablet Take 30 mg by mouth every 4 (four) hours as needed for congestion.   Yes [provider]  vitamin B-12 (CYANOCOBALAMIN) 1000 MCG tablet Take 1 tablet (1,000 mcg total) by mouth daily. 08/07/16  Yes [name], MD    Family History Family History  Problem Relation Age of Onset  . Colitis Mother   . Heart disease Mother   . Hyperlipidemia Mother   . Lung cancer Mother        Cause of death  . Hypertension Mother   . Heart disease Brother   . Valvular heart disease Brother   . Heart attack Brother   . Atrial fibrillation Brother   . Heart failure Brother   . Miscarriages / [place] Brother   . Lung cancer Father   . Leukemia Brother   . Coronary artery disease Brother        MI in late 3's. Still living  . Ovarian cancer Maternal Aunt   . Diabetes Maternal Aunt   . Coronary artery disease Brother        MI in 53's. Also still living.  . Colon cancer Neg Hx   . Colon polyps Neg Hx     Social History Social History   Tobacco Use  . Smoking status: Never Smoker  . Smokeless tobacco: Never Used  Substance Use Topics  . Alcohol use: Yes    Alcohol/week: 1.0 standard drinks    Types: 1 Glasses of wine per week    Comment: occassional  . Drug use: No     Allergies   Fosamax [alendronate sodium]; Hydrocodone-acetaminophen; and Oxycodone-acetaminophen   Review of Systems Review of Systems  All other systems reviewed and are negative.    Physical Exam Updated Vital Signs BP (!) 125/59 (BP Location: Left Arm)   Pulse (!) 106   Temp 99.4 F (37.4 C) (Oral)   Resp 16   Wt 49 kg   SpO2 99%   BMI 21.10  kg/m   Physical Exam  Constitutional: She is oriented to person, place, and time. She appears well-developed and well-nourished.  HENT:  Head: Normocephalic and atraumatic.  Eyes: Pupils are equal, round, and reactive to light. Conjunctivae and EOM are normal.  Neck: Normal range of motion. Neck supple.  Cardiovascular: Normal rate and regular rhythm. Exam reveals no gallop and no friction rub.  No murmur heard. Pulmonary/Chest: Effort normal and breath sounds normal. No respiratory distress. She has no wheezes. She has no  rales. She exhibits no tenderness.  Abdominal: Soft. Bowel sounds are normal. She exhibits no distension and no mass. There is tenderness in the right lower quadrant and left lower quadrant. There is no rebound and no guarding.  Musculoskeletal: Normal range of motion. She exhibits no edema or tenderness.  Neurological: She is alert and oriented to person, place, and time.  Skin: Skin is warm and dry.  Psychiatric: She has a normal mood and affect. Her behavior is normal. Judgment and thought content normal.  Nursing note and vitals reviewed.    ED Treatments / Results  Labs (all labs ordered are listed, but only abnormal results are displayed) Labs Reviewed  COMPREHENSIVE METABOLIC PANEL - Abnormal; Notable for the following components:      Result Value   Sodium 129 (*)    Potassium 2.9 (*)    Chloride 90 (*)    Glucose, Bld 155 (*)    All other components within normal limits  CBC - Abnormal; Notable for the following components:   WBC 18.8 (*)    All other components within normal limits  LIPASE, BLOOD  URINALYSIS, ROUTINE W REFLEX MICROSCOPIC    EKG None  Radiology Ct Abdomen Pelvis W Contrast  Result Date: 08/11/2018 CLINICAL DATA:  Abdominal and pelvic pain since 08/07/2018. Question diverticulitis. Creatinine was obtained on site at [facility] at [address] Results: Creatinine 0.7 mg/dL. EXAM: CT ABDOMEN AND PELVIS WITH  CONTRAST TECHNIQUE: Multidetector CT imaging of the abdomen and pelvis was performed using the standard protocol following bolus administration of intravenous contrast. CONTRAST:  100 mL ISOVUE-300 IOPAMIDOL (ISOVUE-300) INJECTION 61% COMPARISON:  CT abdomen and pelvis 04/29/2005. FINDINGS: Lower chest: Minimal basilar atelectasis is noted. No pleural or pericardial effusion. Hepatobiliary: No focal liver abnormality is seen. Status post cholecystectomy. No biliary dilatation. Pancreas: Unremarkable. No pancreatic ductal dilatation or surrounding inflammatory changes. Spleen: Splenic cyst measuring 3.7 cm is unchanged. The spleen is normal in size. Adrenals/Urinary Tract: Scar in the upper pole of the right kidney is again seen with dilatation of the underlying calyx, unchanged. The kidneys otherwise appear normal. Ureters and urinary bladder are normal. Adrenal glands are normal. Stomach/Bowel: The patient has sigmoid diverticulosis with extensive inflammatory change about the mid to distal sigmoid colon. A contained perforation with abscess formation measures 2.6 cm transverse x 1.8 cm AP x 1.7 cm craniocaudal and is contiguous with the anterior wall of the mid sigmoid colon. In the right pelvis, a second small abscess measures 1 cm craniocaudal x 1.4 cm AP x 1.5 cm transverse. No drainable abscess is identified. The colon is otherwise unremarkable. The appendix has been removed. The stomach and small bowel appear normal. Vascular/Lymphatic: Aortic atherosclerosis. No enlarged abdominal or pelvic lymph nodes. Reproductive: Small calcified uterine fibroid noted. Adnexa unremarkable. Other: None. Musculoskeletal: No fracture or worrisome lesion. There is convex left lumbar scoliosis and multilevel degenerative change. IMPRESSION: Acute sigmoid diverticulitis with 2 small abscesses identified. One of these contiguous with the anterior wall of the sigmoid and compatible with a contained perforation. No drainable  collection. Atherosclerosis. These results will be called to the ordering clinician or representative by the Radiologist Assistant, and communication documented in the PACS or zVision Dashboard. Electronically Signed   By: Inge Rise M.D.   On: 08/11/2018 12:11    Procedures Procedures (including critical care time)  Medications Ordered in ED Medications  ciprofloxacin (CIPRO) IVPB 400 mg (has no administration in time range)  metroNIDAZOLE (FLAGYL) IVPB 500 mg (  has no administration in time range)  ondansetron (ZOFRAN) injection 4 mg (has no administration in time range)  fentaNYL (SUBLIMAZE) injection 50 mcg (has no administration in time range)     Initial Impression / Assessment and Plan / ED Course  I have reviewed the triage vital signs and the nursing notes.  Pertinent labs & imaging results that were available during my care of the patient were reviewed by me and considered in my medical decision making (see chart for details).     Patient with abdominal pain.  Outpatient CT shows complicated diverticulitis.  Leukocytosis to 18.8.  Will start on Cipro and Flagyl.  PRN pain medication ordered.  Patient n.p.o.  Will consult with general surgery and hospitalist service.  Patient discussed with Dr. Francia Greaves, who agrees with the plan.  Discussed with general surgery PA Will, who states general surgery will consult, but asks for medicine admit given non-operative at this time.  Appreciated [facility] for admitting the patient.  Final Clinical Impressions(s) / ED Diagnoses   Final diagnoses:  Abscess of sigmoid colon due to diverticulitis    ED Discharge Orders    None       Montine Circle, PA-C 08/11/18 1419    Valarie Merino, MD 08/12/18 0700

## 2018-08-11 NOTE — H&P (Addendum)
History and Physical    [name] BZJ:[id number] DOB: 1950/11/17 DOA: 08/11/2018  PCP: [name], MD   Patient coming from: Home   Chief Complaint: Abdominal pain.   HPI: [name] is a 67 y.o. female with medical history significant dyslipidemia, hypertension, pernicious anemia, irritable bowel syndrome, diverticulosis and anxiety.  Patient reported 5 days of worsening abdominal pain, localized in the lower quadrants, colicky in nature, severe in intensity, worse with movement, improved with fasting, associated with poor appetite and loose stools, no hematochezia or melena.  No nausea or vomiting.  Symptoms have been worsening in intensity and frequency.  She also has noticed fevers and chills.  She had appendectomy about 13 years ago, she is known to have diverticulosis.  ED Course: Further work-up revealed acute diverticulitis with small abscesses, patient received IV fluids, IV antibiotics, surgery was consulted with recommendations for medical therapy.   Review of Systems:  1. General: Positive fevers, and chills, no weight gain or weight loss 2. ENT: No runny nose or sore throat, no hearing disturbances 3. Pulmonary: No dyspnea, cough, wheezing, or hemoptysis 4. Cardiovascular: No angina, claudication, lower extremity edema, pnd or orthopnea 5. Gastrointestinal: No nausea or vomiting, positive diarrhea and abdominal pain, no constipation 6. Hematology: No easy bruisability or frequent infections 7. Urology: No dysuria, hematuria or increased urinary frequency 8. Dermatology: No rashes. 9. Neurology: No seizures or paresthesias 10. Musculoskeletal: No joint pain or deformities  Past Medical History:  Diagnosis Date  . ALLERGIC RHINITIS   . ANEMIA, PERNICIOUS   . ANXIETY   . BILIARY DYSKINESIA   . CARPAL TUNNEL SYNDROME, LEFT   . DYSLIPIDEMIA   . Headache(784.0)   . HYPERTENSION   . Irritable bowel syndrome   . Non-celiac gluten sensitivity   . OSTEOPENIA     . 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 APPENDECTOMY    . CARPAL TUNNEL RELEASE    . CESAREAN SECTION  76 & 85   x's 2   . CHOLECYSTECTOMY  2005   Dr. Dalbert Batman (Gallbladder dyskinesia)  . COLONOSCOPY    . DILATION AND CURETTAGE OF UTERUS    . LEFT HEART CATHETERIZATION WITH CORONARY ANGIOGRAM N/A 05/19/2012   Procedure: LEFT HEART CATHETERIZATION WITH CORONARY ANGIOGRAM;  Surgeon: Hillary Bow, MD;  Location: [facility] CATH LAB;  Service: Cardiovascular;  Laterality: N/A;  . ROTATOR CUFF REPAIR    . TONSILLECTOMY    . UPPER GASTROINTESTINAL ENDOSCOPY       reports that she has never smoked. She has never used smokeless tobacco. She reports that she drinks about 1.0 standard drinks of alcohol per week. She reports that she does not use drugs.  Allergies  Allergen Reactions  . Fosamax [Alendronate Sodium]     GI intolerance remotely  . Hydrocodone-Acetaminophen     REACTION: itching  . Oxycodone-Acetaminophen     REACTION: itching    Family History  Problem Relation Age of Onset  . Colitis Mother   . Heart disease Mother   . Hyperlipidemia Mother   . Lung cancer Mother        Cause of death  . Hypertension Mother   . Heart disease Brother   . Valvular heart disease Brother   . Heart attack Brother   . Atrial fibrillation Brother   . Heart failure Brother   . Miscarriages / [place] Brother   . Lung cancer Father   .  Leukemia Brother   . Coronary artery disease Brother        MI in late 76's. Still living  . Ovarian cancer Maternal Aunt   . Diabetes Maternal Aunt   . Coronary artery disease Brother        MI in 49's. Also still living.  . Colon cancer Neg Hx   . Colon polyps Neg Hx      Prior to Admission medications   Medication Sig Start Date End Date Taking? Authorizing Provider  ALPRAZolam (XANAX) 0.25 MG tablet Take 1 tablet (0.25 mg total) by mouth 2 (two) times daily as needed for  anxiety. 05/12/18  Yes Burns, Claudina Lick, MD  amLODipine (NORVASC) 10 MG tablet TAKE 1 TABLET BY MOUTH EVERY DAY 03/28/18  Yes Burns, Claudina Lick, MD  calcium-vitamin D (OSCAL WITH D) 500-200 MG-UNIT tablet Take 1 tablet by mouth daily with breakfast.   Yes [provider]  cholecalciferol (VITAMIN D3) 25 MCG (1000 UT) tablet Take 1,000 Units by mouth daily.   Yes [provider]  diclofenac (VOLTAREN) 75 MG EC tablet TAKE 1 TABLET BY MOUTH TWICE A DAY Patient taking differently: Take by mouth 2 (two) times daily as needed for mild pain.  04/14/18  Yes Burns, Claudina Lick, MD  hydrochlorothiazide (HYDRODIURIL) 12.5 MG tablet TAKE 1 TABLET BY MOUTH EVERY DAY 07/19/18  Yes Burns, Claudina Lick, MD  Ibuprofen-diphenhydrAMINE HCl (IBUPROFEN PM) 200-25 MG CAPS Take 1 tablet by mouth at bedtime as needed (pain/sleep).   Yes [provider]  metaxalone (SKELAXIN) 800 MG tablet TAKE 1 TABLET (800 MG TOTAL) BY MOUTH EVERY 8 (EIGHT) HOURS AS NEEDED FOR MUSCLE SPASMS. Patient taking differently: Take 400-800 mg by mouth every 8 (eight) hours as needed for muscle spasms.  04/15/17  Yes Burns, Claudina Lick, MD  nitrofurantoin, macrocrystal-monohydrate, (MACROBID) 100 MG capsule TAKE 1 CAPSULE BY MOUTH TWICE A DAY 01/13/18  Yes Burns, Claudina Lick, MD  pravastatin (PRAVACHOL) 40 MG tablet TAKE 1 TABLET BY MOUTH EVERY DAY 04/13/18  Yes Burns, Claudina Lick, MD  pseudoephedrine (SUDAFED) 30 MG tablet Take 30 mg by mouth every 4 (four) hours as needed for congestion.   Yes [provider]  vitamin B-12 (CYANOCOBALAMIN) 1000 MCG tablet Take 1 tablet (1,000 mcg total) by mouth daily. 08/07/16  Yes [name], MD    Physical Exam: Vitals:   08/11/18 1313  BP: (!) 125/59  Pulse: (!) 106  Resp: 16  Temp: 99.4 F (37.4 C)  TempSrc: Oral  SpO2: 99%  Weight: 49 kg    Vitals:   08/11/18 1313  BP: (!) 125/59  Pulse: (!) 106  Resp: 16  Temp: 99.4 F (37.4 C)  TempSrc: Oral  SpO2: 99%  Weight: 49 kg   General:  deconditioned, has received fentanyl before my examination, and patient did not look in severe pain.  Neurology: Awake and alert, non focal Head and Neck. Head normocephalic. Neck supple with no adenopathy or thyromegaly.   E ENT: mild pallor, no icterus, oral mucosa dry.  Cardiovascular: No JVD. S1-S2 present, rhythmic, no gallops, rubs, or murmurs. No lower extremity edema. Pulmonary: positive breath sounds bilaterally, adequate air movement, no wheezing, rhonchi or rales. Gastrointestinal. Abdomen mild distended, tender to deep palpation with no organomegaly, with no rebound or guarding Skin. No rashes Musculoskeletal: no joint deformities    Labs on Admission: I have personally reviewed following labs and imaging studies  CBC: Recent Labs  Lab 08/11/18 1015 08/11/18 1334  WBC 16.7* 18.8*  NEUTROABS 14.8*  --   HGB 13.1 13.0  HCT 38.4 37.7  MCV 91.3 92.4  PLT 202.0 644   Basic Metabolic Panel: Recent Labs  Lab 08/11/18 1015 08/11/18 1334  NA 129* 129*  K 3.7 2.9*  CL 92* 90*  CO2 27 26  GLUCOSE 113* 155*  BUN 7 8  CREATININE 0.71 0.73  CALCIUM 9.5 9.0   GFR: Estimated Creatinine Clearance: 50.4 mL/min (by C-G formula based on SCr of 0.73 mg/dL). Liver Function Tests: Recent Labs  Lab 08/11/18 1015 08/11/18 1334  AST 15 23  ALT 20 24  ALKPHOS 66 68  BILITOT 1.3* 1.2  PROT 7.1 7.3  ALBUMIN 4.5 4.5   Recent Labs  Lab 08/11/18 1334  LIPASE 26   No results for input(s): AMMONIA in the last 168 hours. Coagulation Profile: No results for input(s): INR, PROTIME in the last 168 hours. Cardiac Enzymes: No results for input(s): CKTOTAL, CKMB, CKMBINDEX, TROPONINI in the last 168 hours. BNP (last 3 results) No results for input(s): PROBNP in the last 8760 hours. HbA1C: No results for input(s): HGBA1C in the last 72 hours. CBG: No results for input(s): GLUCAP in the last 168 hours. Lipid Profile: No results for input(s): CHOL, HDL, LDLCALC, TRIG, CHOLHDL,  LDLDIRECT in the last 72 hours. Thyroid Function Tests: No results for input(s): TSH, T4TOTAL, FREET4, T3FREE, THYROIDAB in the last 72 hours. Anemia Panel: No results for input(s): VITAMINB12, FOLATE, FERRITIN, TIBC, IRON, RETICCTPCT in the last 72 hours. Urine analysis:    Component Value Date/Time   COLORURINE YELLOW 08/11/2018 1015   APPEARANCEUR Sl Cloudy (A) 08/11/2018 1015   LABSPEC <=1.005 (A) 08/11/2018 1015   PHURINE 7.0 08/11/2018 1015   GLUCOSEU NEGATIVE 08/11/2018 1015   HGBUR SMALL (A) 08/11/2018 1015   BILIRUBINUR SMALL (A) 08/11/2018 1015   BILIRUBINUR negative 01/10/2015 1456   KETONESUR 40 (A) 08/11/2018 1015   PROTEINUR negative 01/10/2015 1456   UROBILINOGEN 0.2 08/11/2018 1015   NITRITE NEGATIVE 08/11/2018 1015   LEUKOCYTESUR LARGE (A) 08/11/2018 1015    Radiological Exams on Admission: Ct Abdomen Pelvis W Contrast  Result Date: 08/11/2018 CLINICAL DATA:  Abdominal and pelvic pain since 08/07/2018. Question diverticulitis. Creatinine was obtained on site at [place] at [address] Results: Creatinine 0.7 mg/dL. EXAM: CT ABDOMEN AND PELVIS WITH CONTRAST TECHNIQUE: Multidetector CT imaging of the abdomen and pelvis was performed using the standard protocol following bolus administration of intravenous contrast. CONTRAST:  100 mL ISOVUE-300 IOPAMIDOL (ISOVUE-300) INJECTION 61% COMPARISON:  CT abdomen and pelvis 04/29/2005. FINDINGS: Lower chest: Minimal basilar atelectasis is noted. No pleural or pericardial effusion. Hepatobiliary: No focal liver abnormality is seen. Status post cholecystectomy. No biliary dilatation. Pancreas: Unremarkable. No pancreatic ductal dilatation or surrounding inflammatory changes. Spleen: Splenic cyst measuring 3.7 cm is unchanged. The spleen is normal in size. Adrenals/Urinary Tract: Scar in the upper pole of the right kidney is again seen with dilatation of the underlying calyx, unchanged. The kidneys otherwise appear  normal. Ureters and urinary bladder are normal. Adrenal glands are normal. Stomach/Bowel: The patient has sigmoid diverticulosis with extensive inflammatory change about the mid to distal sigmoid colon. A contained perforation with abscess formation measures 2.6 cm transverse x 1.8 cm AP x 1.7 cm craniocaudal and is contiguous with the anterior wall of the mid sigmoid colon. In the right pelvis, a second small abscess measures 1 cm craniocaudal x 1.4 cm AP x 1.5 cm transverse. No drainable abscess is  identified. The colon is otherwise unremarkable. The appendix has been removed. The stomach and small bowel appear normal. Vascular/Lymphatic: Aortic atherosclerosis. No enlarged abdominal or pelvic lymph nodes. Reproductive: Small calcified uterine fibroid noted. Adnexa unremarkable. Other: None. Musculoskeletal: No fracture or worrisome lesion. There is convex left lumbar scoliosis and multilevel degenerative change. IMPRESSION: Acute sigmoid diverticulitis with 2 small abscesses identified. One of these contiguous with the anterior wall of the sigmoid and compatible with a contained perforation. No drainable collection. Atherosclerosis. These results will be called to the ordering clinician or representative by the Radiologist Assistant, and communication documented in the PACS or zVision Dashboard. Electronically Signed   By: Inge Rise M.D.   On: 08/11/2018 12:11    EKG: Independently reviewed. na  Assessment/Plan Active Problems:   Diverticulitis  67 year old female who is known to have diverticulosis who presents with 5 days history of lower abdominal colicky pain which has been worsening in intensity and frequency.  Associated with poor appetite and generalized weakness.  On her initial physical examination she was febrile 38.3 C, blood pressure 119/58, heart rate 93 to 106, respiratory rate 18, oxygen saturation 99% on room air.  Dry mucous membranes, lungs clear to auscultation bilaterally,  heart S1-S2 present rhythmic, abdomen tender to deep palpation lower quadrants, no rebound or guarding, no lower extremity edema.  Sodium 129, potassium 2.9, chloride 90, bicarb 26, glucose 155, BUN 8, creatinine 0.73, white cell count 18.8, hemoglobin 13.0, hematocrit 37.7, platelets 211.  Urinalysis negative for infection.  CT scan of the abdomen with acute sigmoid diverticulitis with 2 small abscesses. One of these continues with anterior wall of the sigmoid and compatible with a contained perforation.  No drainable collection.   Patient will be admitted to the hospital with a working diagnosis of complicated diverticulitis by 2 small abscesses, 1 of them consistent with contained perforation.  1.  Acute complicated diverticulitis, perforated diverticulitis.  Patient has 2 evident abscesses, at this point there is no drainable collection, will continue pain control with IV morphine, will keep patient nothing by mouth, hydration with IV fluids, and broad-spectrum IV antibiotic with Zosyn.  Continue follow-up on cultures, temperature curve and cell count.  Follow-up on surgery recommendations.  Continue IV antiacids and as needed antiemetics.  2.  Hypokalemia and hyponatremia.  Likely related to dehydration, continue hydration with isotonic saline, potassium correction with potassium chloride.  Kidney function is preserved, follow-up kidney function morning.  2.  Hypertension.  Will hold on oral antihypertensive agents, at home patient on amlodipine and hydrochlorothiazide, to prevent hypotension, continue hydration with IV fluids and blood pressure monitoring.  3.  Dyslipidemia.  Resume pravastatin when able to tolerate p.o.  4.  Anxiety.  Continue alprazolam.  DVT prophylaxis:  Enoxaparin  Code Status: full  Family Communication: I spoke with patient's family at the bedside and all questions were addressed.    Disposition Plan: med surg   Consults called: surgery   Admission status:  inpatient.     Daziya Redmond Gerome Apley MD [facility] Pager [phone]  If 7PM-7AM, please contact night-coverage [email or web address] Password [email or web address]  08/11/2018, 2:19 PM

## 2018-08-11 NOTE — Consult Note (Addendum)
Reason for Consult: Sigmoid diverticulitis with 2 small abscesses. Referring Physician: Montine Circle, PA-C  [name] is an 67 y.o. female.    HPI: Patient is a 67 year old female who presented to her primary care today with 4 days of lower abdominal pain.  She describes as a cramping sensation and was sharp at times.  She has had no relief with antacids, or NSAID medications.  She reported some loose stools with some mucus.  She reported low-grade fever between 99-100.5.  Still complaining of urinary symptoms which improve after she voids.  She has a history of diverticulosis with her last colonoscopy 3 years ago.  She has never had diverticulitis.  She was set for evaluation including laboratory which showed a WBC of 16.7, H/H 13.1/38.4.  Platelets 202,000.  Sodium is 129, electrolytes are otherwise normal creatinine 0.71.  Repeat labs in the ED shows her sodium 129 but her potassium is down to 2.9.  Glucose up to 155, WBC is up to 18.8.  CT scan shows the patient has [place] diverticulosis with extensive inflammatory changes about the mid to distal colon a contained perforation abscess measures 2.6 x 1.8 x 1.7 contiguous with the anterior wall of the mid sigmoid colon in the right pelvis a second small abscess measuring 1 cm x 1.4 x 1.5 cm.  There is no drainable abscess identified.  The colon was otherwise unremarkable.  Appendix is absent.  Stomach and small bowel appeared normal.  She has a small calcified uterine fibroid.  We are asked to see.  Past Medical History:  Diagnosis Date  . ALLERGIC RHINITIS   . ANEMIA, PERNICIOUS   . ANXIETY   . BILIARY DYSKINESIA   . CARPAL TUNNEL SYNDROME, LEFT   . DYSLIPIDEMIA   . Headache(784.0)   . HYPERTENSION   . Irritable bowel syndrome   . Non-celiac gluten sensitivity   . OSTEOPENIA   . 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[place])    borderline    Past Surgical History:  Procedure Laterality Date  . APPENDECTOMY    . CARPAL TUNNEL RELEASE     . CESAREAN SECTION  76 & 85   x's 2   . CHOLECYSTECTOMY  2005   Dr. Dalbert Batman (Gallbladder dyskinesia)  . COLONOSCOPY    . DILATION AND CURETTAGE OF UTERUS    . LEFT HEART CATHETERIZATION WITH CORONARY ANGIOGRAM N/A 05/19/2012   Procedure: LEFT HEART CATHETERIZATION WITH CORONARY ANGIOGRAM;  Surgeon: Hillary Bow, MD;  Location: [facility] CATH LAB;  Service: Cardiovascular;  Laterality: N/A;  . ROTATOR CUFF REPAIR    . TONSILLECTOMY    . UPPER GASTROINTESTINAL ENDOSCOPY      Family History  Problem Relation Age of Onset  . Colitis Mother   . Heart disease Mother   . Hyperlipidemia Mother   . Lung cancer Mother        Cause of death  . Hypertension Mother   . Heart disease Brother   . Valvular heart disease Brother   . Heart attack Brother   . Atrial fibrillation Brother   . Heart failure Brother   . Miscarriages / [place] Brother   . Lung cancer Father   . Leukemia Brother   . Coronary artery disease Brother        MI in late 55's. Still living  . Ovarian cancer Maternal Aunt   . Diabetes Maternal Aunt   . Coronary artery disease Brother        MI in 89's. Also still living.  [name]  Colon cancer Neg Hx   . Colon polyps Neg Hx     Social History:  reports that she has never smoked. She has never used smokeless tobacco. She reports that she drinks about 1.0 standard drinks of alcohol per week. She reports that she does not use drugs.  Allergies:  Allergies  Allergen Reactions  . Fosamax [Alendronate Sodium]     GI intolerance remotely  . Hydrocodone-Acetaminophen     REACTION: itching  . Oxycodone-Acetaminophen     REACTION: itching   Prior to Admission medications   Medication Sig Start Date End Date Taking? Authorizing Provider  ALPRAZolam (XANAX) 0.25 MG tablet Take 1 tablet (0.25 mg total) by mouth 2 (two) times daily as needed for anxiety. 05/12/18  Yes Burns, Claudina Lick, MD  amLODipine (NORVASC) 10 MG tablet TAKE 1 TABLET BY MOUTH EVERY DAY 03/28/18  Yes Burns, Claudina Lick,  MD  calcium-vitamin D (OSCAL WITH D) 500-200 MG-UNIT tablet Take 1 tablet by mouth daily with breakfast.   Yes [provider]  cholecalciferol (VITAMIN D3) 25 MCG (1000 UT) tablet Take 1,000 Units by mouth daily.   Yes [provider]  diclofenac (VOLTAREN) 75 MG EC tablet TAKE 1 TABLET BY MOUTH TWICE A DAY Patient taking differently: Take by mouth 2 (two) times daily as needed for mild pain.  04/14/18  Yes Burns, Claudina Lick, MD  hydrochlorothiazide (HYDRODIURIL) 12.5 MG tablet TAKE 1 TABLET BY MOUTH EVERY DAY 07/19/18  Yes Burns, Claudina Lick, MD  Ibuprofen-diphenhydrAMINE HCl (IBUPROFEN PM) 200-25 MG CAPS Take 1 tablet by mouth at bedtime as needed (pain/sleep).   Yes [provider]  metaxalone (SKELAXIN) 800 MG tablet TAKE 1 TABLET (800 MG TOTAL) BY MOUTH EVERY 8 (EIGHT) HOURS AS NEEDED FOR MUSCLE SPASMS. Patient taking differently: Take 400-800 mg by mouth every 8 (eight) hours as needed for muscle spasms.  04/15/17  Yes Burns, Claudina Lick, MD  nitrofurantoin, macrocrystal-monohydrate, (MACROBID) 100 MG capsule TAKE 1 CAPSULE BY MOUTH TWICE A DAY 01/13/18  Yes Burns, Claudina Lick, MD  pravastatin (PRAVACHOL) 40 MG tablet TAKE 1 TABLET BY MOUTH EVERY DAY 04/13/18  Yes Burns, Claudina Lick, MD  pseudoephedrine (SUDAFED) 30 MG tablet Take 30 mg by mouth every 4 (four) hours as needed for congestion.   Yes [provider]  vitamin B-12 (CYANOCOBALAMIN) 1000 MCG tablet Take 1 tablet (1,000 mcg total) by mouth daily. 08/07/16  Yes [name], MD      Medications:  Continuous: . ciprofloxacin    . metronidazole 500 mg (08/11/18 1410)   Anti-infectives (From admission, onward)   Start     Dose/Rate Route Frequency Ordered Stop   08/11/18 1400  ciprofloxacin (CIPRO) IVPB 400 mg     400 mg 200 mL/hr over 60 Minutes Intravenous  Once 08/11/18 1353     08/11/18 1400  metroNIDAZOLE (FLAGYL) IVPB 500 mg     500 mg 100 mL/hr over 60 Minutes Intravenous  Once 08/11/18 1353         Results for orders placed or performed during the hospital encounter of 08/11/18 (from the past 48 hour(s))  CBC     Status: Abnormal   Collection Time: 08/11/18  1:34 PM  Result Value Ref Range   WBC 18.8 (H) 4.0 - 10.5 K/uL   RBC 4.08 3.87 - 5.11 MIL/uL   Hemoglobin 13.0 12.0 - 15.0 g/dL   HCT 37.7 36.0 - 46.0 %   MCV 92.4 80.0 - 100.0 fL   MCH  31.9 26.0 - 34.0 pg   MCHC 34.5 30.0 - 36.0 g/dL   RDW 12.1 11.5 - 15.5 %   Platelets 211 150 - 400 K/uL   nRBC 0.0 0.0 - 0.2 %    Comment: Performed at [facility], [place] [address], [place], [place] [address]    Ct Abdomen Pelvis W Contrast  Result Date: 08/11/2018 CLINICAL DATA:  Abdominal and pelvic pain since 08/07/2018. Question diverticulitis. Creatinine was obtained on site at [place] at [address] Results: Creatinine 0.7 mg/dL. EXAM: CT ABDOMEN AND PELVIS WITH CONTRAST TECHNIQUE: Multidetector CT imaging of the abdomen and pelvis was performed using the standard protocol following bolus administration of intravenous contrast. CONTRAST:  100 mL ISOVUE-300 IOPAMIDOL (ISOVUE-300) INJECTION 61% COMPARISON:  CT abdomen and pelvis 04/29/2005. FINDINGS: Lower chest: Minimal basilar atelectasis is noted. No pleural or pericardial effusion. Hepatobiliary: No focal liver abnormality is seen. Status post cholecystectomy. No biliary dilatation. Pancreas: Unremarkable. No pancreatic ductal dilatation or surrounding inflammatory changes. Spleen: Splenic cyst measuring 3.7 cm is unchanged. The spleen is normal in size. Adrenals/Urinary Tract: Scar in the upper pole of the right kidney is again seen with dilatation of the underlying calyx, unchanged. The kidneys otherwise appear normal. Ureters and urinary bladder are normal. Adrenal glands are normal. Stomach/Bowel: The patient has sigmoid diverticulosis with extensive inflammatory change about the mid to distal sigmoid colon. A contained perforation with abscess  formation measures 2.6 cm transverse x 1.8 cm AP x 1.7 cm craniocaudal and is contiguous with the anterior wall of the mid sigmoid colon. In the right pelvis, a second small abscess measures 1 cm craniocaudal x 1.4 cm AP x 1.5 cm transverse. No drainable abscess is identified. The colon is otherwise unremarkable. The appendix has been removed. The stomach and small bowel appear normal. Vascular/Lymphatic: Aortic atherosclerosis. No enlarged abdominal or pelvic lymph nodes. Reproductive: Small calcified uterine fibroid noted. Adnexa unremarkable. Other: None. Musculoskeletal: No fracture or worrisome lesion. There is convex left lumbar scoliosis and multilevel degenerative change. IMPRESSION: Acute sigmoid diverticulitis with 2 small abscesses identified. One of these contiguous with the anterior wall of the sigmoid and compatible with a contained perforation. No drainable collection. Atherosclerosis. These results will be called to the ordering clinician or representative by the Radiologist Assistant, and communication documented in the PACS or zVision Dashboard. Electronically Signed   By: Inge Rise M.D.   On: 08/11/2018 12:11    Review of Systems  Constitutional: Positive for fever. Negative for chills, diaphoresis, malaise/fatigue and weight loss.  HENT: Negative.   Eyes: Negative.   Respiratory: Negative.   Cardiovascular: Negative.   Gastrointestinal: Positive for abdominal pain (Pain both left and right lower quadrants "pain hiip to him."  worse with ambulation.  Some relief with voiding), constipation (remote hx of constipation in the past, Rx Miralax and stool softern PRN - Dr,. Sam [place]), diarrhea (some not much) and heartburn. Negative for blood in stool, melena, nausea and vomiting.  Genitourinary: Positive for dysuria. Negative for flank pain, frequency, hematuria and urgency.       Relief of some of the abdominal pain with voiding  Musculoskeletal: Negative.   Skin: Negative.    Neurological: Negative.   Endo/Heme/Allergies: Negative.   Psychiatric/Behavioral: Negative.    Blood pressure (!) 125/59, pulse (!) 106, temperature 99.4 F (37.4 C), temperature source Oral, resp. rate 16, weight 49 kg, SpO2 99 %. Physical Exam  Constitutional: She is oriented to person, place, and time. She  appears well-developed and well-nourished. No distress.  HENT:  Head: Normocephalic and atraumatic.  Mouth/Throat: Oropharynx is clear and moist. No oropharyngeal exudate.  Eyes: Right eye exhibits no discharge. Left eye exhibits no discharge. No scleral icterus.  Pupils are equal  Neck: Normal range of motion. Neck supple. No JVD present. No tracheal deviation present. No thyromegaly present.  Cardiovascular: Normal rate, regular rhythm, normal heart sounds and intact distal pulses.  No murmur heard. Respiratory: Effort normal and breath sounds normal. No respiratory distress. She has no wheezes. She has no rales. She exhibits no tenderness.  GI: She exhibits distension (minimal ). She exhibits no mass. There is tenderness (left>right lower quadrants). There is rebound (some, and it hurts to walk). There is no guarding.  BS hypoactive  Musculoskeletal: She exhibits no edema or tenderness.  Lymphadenopathy:    She has no cervical adenopathy.  Neurological: She is alert and oriented to person, place, and time. No cranial nerve deficit.  Skin: Skin is dry. No rash noted. She is not diaphoretic. No erythema. No pallor.  Psychiatric: She has a normal mood and affect. Her behavior is normal. Judgment and thought content normal.    Assessment/Plan: Acute sigmoid diverticulitis with 2 abscesses   1.  2.6 x 1.8 x 1.7 cm anterior wall of the mid sigmoid colon  2. 1.0 x 1.4 x 1.5 cm right pelvis  Hx hypertension Selective IgA immunodeficiency  Non celiac gluten sensitivity Occasional constipation Anemia Dyslipidemia S/p cholecystectomy/appendectomy Hx of negative cardiac cath  2013  Plan:  Pt has sigmoid diverticulitis with perforation and 2 abscess; right pelvis and anterior wall mid sigmoid..  Both to small to drain.   Plan medical management at this time.  NPO except ice, IV hydration, bowel rest, starting Zosyn.  Will follow with you.   Redding Cloe 08/11/2018, 2:08 PM

## 2018-08-11 NOTE — Telephone Encounter (Signed)
Elnora Morrison with [facility] called a CT Abdomen stat report, result read back and verified resulted in chart. She says the patient went home to wait on the results.  IMPRESSION: Acute sigmoid diverticulitis with 2 small abscesses identified. One of these contiguous with the anterior wall of the sigmoid and compatible with a contained perforation. No drainable collection.

## 2018-08-11 NOTE — Assessment & Plan Note (Signed)
With lower abdominal pain and low-grade fever concern for possible diverticulitis CBC, CMP and CT of the abdomen and pelvis

## 2018-08-11 NOTE — ED Notes (Signed)
Per PCP-patient has a history diverticulitis, states had CT done, showed possible abscess

## 2018-08-11 NOTE — ED Triage Notes (Signed)
Pt states she has had abdominal pain and cramping since Sunday that has since intensified. Pt denies N/V. Pt states it has been difficult to go to the bathroom, but when she can have a BM, it has been loose. Pt states pain has been from "hip to hip"

## 2018-08-11 NOTE — Telephone Encounter (Signed)
'  [name]' from [facility] calling with results CT Abdomen/Pelvis with contrast:  IMPRESSION: Acute sigmoid diverticulitis with 2 small abscesses identified. One of these contiguous with the anterior wall of the sigmoid and compatible with a contained perforation. No drainable collection.  Atherosclerosis. Spoke to [place], made aware results are in Epic.

## 2018-08-11 NOTE — Telephone Encounter (Signed)
Called [facility] ED spoke w/charge nurse Stacy inform her MD advise pt to come to ED. She had CT abd done...results showed diverticulitis w/abcess w/perforation.[name] KitchenJohny Chess

## 2018-08-11 NOTE — Patient Instructions (Signed)
  Tests ordered today. Your results will be released to [place] (or called to you) after review, usually within 72hours after test completion. If any changes need to be made, you will be notified at that same time.   A Ct scan of your abdomen was ordered.

## 2018-08-11 NOTE — Progress Notes (Signed)
Pharmacy Antibiotic Note  [name] is a 67 y.o. female admitted on 08/11/2018 with intra-abdominal infection.  CT showed diverticulitis with 2 small abscesses and possible contained perforation.  Pharmacy has been consulted for Zosyn dosing.  Plan: Zosyn 3.375g IV Q8H infused over 4hrs. Dosage remains stable and need for further dosage adjustment appears unlikely at present.  Pharmacy will sign off at this time.  Please reconsult if a change in clinical status warrants re-evaluation of dosage.  Weight: 108 lb 0.4 oz (49 kg)  Temp (24hrs), Avg:99.3 F (37.4 C), Min:99.1 F (37.3 C), Max:99.4 F (37.4 C)  Recent Labs  Lab 08/11/18 1015 08/11/18 1334  WBC 16.7* 18.8*  CREATININE 0.71 0.73    Estimated Creatinine Clearance: 50.4 mL/min (by C-G formula based on SCr of 0.73 mg/dL).    Allergies  Allergen Reactions  . Fosamax [Alendronate Sodium]     GI intolerance remotely  . Hydrocodone-Acetaminophen     REACTION: itching  . Oxycodone-Acetaminophen     REACTION: itching    Antimicrobials this admission: 12/5 Metronidazole x1 12/5 Zosyn >>  Dose adjustments this admission:  Microbiology results:   Thank you for allowing pharmacy to be a part of this patient's care.  Gretta Arab PharmD, BCPS Pager [phone] 08/11/2018 2:53 PM

## 2018-08-11 NOTE — ED Notes (Signed)
ED TO INPATIENT HANDOFF REPORT  Name/Age/Gender [name] 67 y.o. female  Code Status Code Status History    Date Active Date Inactive Code Status Order ID Comments User Context   05/18/2012 1943 05/20/2012 0058 Full Code [id number]  Monika Salk, MD Inpatient      Home/SNF/Other Home  Chief Complaint abd pain   Level of Care/Admitting Diagnosis ED Disposition    ED Disposition Condition [place]: [facility] [[id number]]  Level of Care: Med-Surg [16]  Diagnosis: Diverticulitis [737106]  Admitting Physician: Tawni Millers [[id number]]  Attending Physician: Tawni Millers [[id number]]  Estimated length of stay: 3 - 4 days  Certification:: I certify this patient will need inpatient services for at least 2 midnights  PT Class (Do Not Modify): Inpatient [101]  PT Acc Code (Do Not Modify): Private [1]       Medical History Past Medical History:  Diagnosis Date  . ALLERGIC RHINITIS   . ANEMIA, PERNICIOUS   . ANXIETY   . BILIARY DYSKINESIA   . CARPAL TUNNEL SYNDROME, LEFT   . DYSLIPIDEMIA   . Headache(784.0)   . HYPERTENSION   . Irritable bowel syndrome   . Non-celiac gluten sensitivity   . OSTEOPENIA   . 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[facility])    borderline    Allergies Allergies  Allergen Reactions  . Fosamax [Alendronate Sodium]     GI intolerance remotely  . Hydrocodone-Acetaminophen     REACTION: itching  . Oxycodone-Acetaminophen     REACTION: itching    IV Location/Drains/Wounds Patient Lines/Drains/Airways Status   Active Line/Drains/Airways    Name:   Placement date:   Placement time:   Site:   Days:   Peripheral IV 08/11/18 Right Antecubital   08/11/18    1338    Antecubital   less than 1   Post Cath / Sheath 05/19/12 Right Femoral   05/19/12    2010    Femoral   2275          Labs/Imaging Results for orders placed or performed during the hospital encounter of 08/11/18 (from the past 48  hour(s))  Lipase, blood     Status: None   Collection Time: 08/11/18  1:34 PM  Result Value Ref Range   Lipase 26 11 - 51 U/L    Comment: Performed at [facility], [place] [address] [place], [place] [address]  Comprehensive metabolic panel     Status: Abnormal   Collection Time: 08/11/18  1:34 PM  Result Value Ref Range   Sodium 129 (L) 135 - 145 mmol/L   Potassium 2.9 (L) 3.5 - 5.1 mmol/L   Chloride 90 (L) 98 - 111 mmol/L   CO2 26 22 - 32 mmol/L   Glucose, Bld 155 (H) 70 - 99 mg/dL   BUN 8 8 - 23 mg/dL   Creatinine, Ser 0.73 0.44 - 1.00 mg/dL   Calcium 9.0 8.9 - 10.3 mg/dL   Total Protein 7.3 6.5 - 8.1 g/dL   Albumin 4.5 3.5 - 5.0 g/dL   AST 23 15 - 41 U/L   ALT 24 0 - 44 U/L   Alkaline Phosphatase 68 38 - 126 U/L   Total Bilirubin 1.2 0.3 - 1.2 mg/dL   GFR calc non Af Amer >60 >60 mL/min   GFR calc Af Amer >60 >60 mL/min   Anion gap 13 5 - 15    Comment: Performed at [facility], [address] [place], [place] [address]  CBC     Status: Abnormal   Collection Time: 08/11/18  1:34 PM  Result Value Ref Range   WBC 18.8 (H) 4.0 - 10.5 K/uL   RBC 4.08 3.87 - 5.11 MIL/uL   Hemoglobin 13.0 12.0 - 15.0 g/dL   HCT 37.7 36.0 - 46.0 %   MCV 92.4 80.0 - 100.0 fL   MCH 31.9 26.0 - 34.0 pg   MCHC 34.5 30.0 - 36.0 g/dL   RDW 12.1 11.5 - 15.5 %   Platelets 211 150 - 400 K/uL   nRBC 0.0 0.0 - 0.2 %    Comment: Performed at [facility], [place] [address] [place], [place] [address]   Ct Abdomen Pelvis W Contrast  Result Date: 08/11/2018 CLINICAL DATA:  Abdominal and pelvic pain since 08/07/2018. Question diverticulitis. Creatinine was obtained on site at [place] at [address] Results: Creatinine 0.7 mg/dL. EXAM: CT ABDOMEN AND PELVIS WITH CONTRAST TECHNIQUE: Multidetector CT imaging of the abdomen and pelvis was performed using the standard protocol following bolus administration of intravenous contrast.  CONTRAST:  100 mL ISOVUE-300 IOPAMIDOL (ISOVUE-300) INJECTION 61% COMPARISON:  CT abdomen and pelvis 04/29/2005. FINDINGS: Lower chest: Minimal basilar atelectasis is noted. No pleural or pericardial effusion. Hepatobiliary: No focal liver abnormality is seen. Status post cholecystectomy. No biliary dilatation. Pancreas: Unremarkable. No pancreatic ductal dilatation or surrounding inflammatory changes. Spleen: Splenic cyst measuring 3.7 cm is unchanged. The spleen is normal in size. Adrenals/Urinary Tract: Scar in the upper pole of the right kidney is again seen with dilatation of the underlying calyx, unchanged. The kidneys otherwise appear normal. Ureters and urinary bladder are normal. Adrenal glands are normal. Stomach/Bowel: The patient has sigmoid diverticulosis with extensive inflammatory change about the mid to distal sigmoid colon. A contained perforation with abscess formation measures 2.6 cm transverse x 1.8 cm AP x 1.7 cm craniocaudal and is contiguous with the anterior wall of the mid sigmoid colon. In the right pelvis, a second small abscess measures 1 cm craniocaudal x 1.4 cm AP x 1.5 cm transverse. No drainable abscess is identified. The colon is otherwise unremarkable. The appendix has been removed. The stomach and small bowel appear normal. Vascular/Lymphatic: Aortic atherosclerosis. No enlarged abdominal or pelvic lymph nodes. Reproductive: Small calcified uterine fibroid noted. Adnexa unremarkable. Other: None. Musculoskeletal: No fracture or worrisome lesion. There is convex left lumbar scoliosis and multilevel degenerative change. IMPRESSION: Acute sigmoid diverticulitis with 2 small abscesses identified. One of these contiguous with the anterior wall of the sigmoid and compatible with a contained perforation. No drainable collection. Atherosclerosis. These results will be called to the ordering clinician or representative by the Radiologist Assistant, and communication documented in the PACS  or zVision Dashboard. Electronically Signed   By: Inge Rise M.D.   On: 08/11/2018 12:11    Pending Labs Unresulted Labs (From admission, onward)    Start     Ordered   08/11/18 1315  Urinalysis, Routine w reflex microscopic  ONCE - STAT,   STAT     08/11/18 1314   Signed and Held  HIV antibody (Routine Testing)  Once,   R     Signed and Held   Signed and Held  CBC  (enoxaparin (LOVENOX)    CrCl >/= 30 ml/min)  Once,   R    Comments:  Baseline for enoxaparin therapy IF NOT ALREADY DRAWN.  Notify MD if PLT < 100 K.    Signed  and Held   Signed and Held  Creatinine, serum  (enoxaparin (LOVENOX)    CrCl >/= 30 ml/min)  Once,   R    Comments:  Baseline for enoxaparin therapy IF NOT ALREADY DRAWN.    Signed and Held   Signed and Held  Creatinine, serum  (enoxaparin (LOVENOX)    CrCl >/= 30 ml/min)  Weekly,   R    Comments:  while on enoxaparin therapy    Signed and Held   Signed and Held  Basic metabolic panel  Tomorrow morning,   R     Signed and Held   Signed and Held  CBC  Tomorrow morning,   R     Signed and Held          Vitals/Pain Today's Vitals   08/11/18 1313  BP: (!) 125/59  Pulse: (!) 106  Resp: 16  Temp: 99.4 F (37.4 C)  TempSrc: Oral  SpO2: 99%  Weight: 49 kg  PainSc: 9     Isolation Precautions No active isolations  Medications Medications  fentaNYL (SUBLIMAZE) injection 50 mcg (has no administration in time range)  piperacillin-tazobactam (ZOSYN) IVPB 3.375 g (has no administration in time range)  piperacillin-tazobactam (ZOSYN) IVPB 3.375 g (has no administration in time range)  metroNIDAZOLE (FLAGYL) IVPB 500 mg (500 mg Intravenous New Bag/Given 08/11/18 1410)  ondansetron (ZOFRAN) injection 4 mg (4 mg Intravenous Given 08/11/18 1408)    Mobility walks

## 2018-08-11 NOTE — Assessment & Plan Note (Signed)
9.5/10 pain with palpation with guarding across lower abdomen, change in bowels, low grade fever Concern for diverticulitis,r/o UTI Cbc, cmp, UA, UCx Ct A/P today Management depending on above results

## 2018-08-12 LAB — CBC
HCT: 32.9 % — ABNORMAL LOW (ref 36.0–46.0)
Hemoglobin: 11.3 g/dL — ABNORMAL LOW (ref 12.0–15.0)
MCH: 31.2 pg (ref 26.0–34.0)
MCHC: 34.3 g/dL (ref 30.0–36.0)
MCV: 90.9 fL (ref 80.0–100.0)
Platelets: 181 10*3/uL (ref 150–400)
RBC: 3.62 MIL/uL — ABNORMAL LOW (ref 3.87–5.11)
RDW: 11.9 % (ref 11.5–15.5)
WBC: 12.6 10*3/uL — ABNORMAL HIGH (ref 4.0–10.5)
nRBC: 0 % (ref 0.0–0.2)

## 2018-08-12 LAB — BASIC METABOLIC PANEL
Anion gap: 12 (ref 5–15)
BUN: 5 mg/dL — ABNORMAL LOW (ref 8–23)
CALCIUM: 8.3 mg/dL — AB (ref 8.9–10.3)
CO2: 22 mmol/L (ref 22–32)
Chloride: 98 mmol/L (ref 98–111)
Creatinine, Ser: 0.63 mg/dL (ref 0.44–1.00)
GFR calc Af Amer: >60 mL/min (ref >60)
GFR calc non Af Amer: >60 mL/min (ref >60)
Glucose, Bld: 138 mg/dL — ABNORMAL HIGH (ref 70–99)
Potassium: 3.2 mmol/L — ABNORMAL LOW (ref 3.5–5.1)
Sodium: 132 mmol/L — ABNORMAL LOW (ref 135–145)

## 2018-08-12 LAB — URINE CULTURE
MICRO NUMBER: 91457889
SPECIMEN QUALITY:: ADEQUATE

## 2018-08-12 LAB — HIV ANTIBODY (ROUTINE TESTING W REFLEX): HIV Screen 4th Generation wRfx: NONREACTIVE

## 2018-08-12 MED ORDER — POTASSIUM CHLORIDE 10 MEQ/100ML IV SOLN
10.0000 meq | INTRAVENOUS | Status: AC
  Administered 2018-08-12 – 2018-08-13 (×6): 10 meq via INTRAVENOUS
  Filled 2018-08-12 (×2): qty 100

## 2018-08-12 MED ORDER — POTASSIUM CHLORIDE 10 MEQ/100ML IV SOLN
INTRAVENOUS | Status: AC
  Administered 2018-08-12: 10 meq
  Filled 2018-08-12: qty 100

## 2018-08-12 NOTE — Progress Notes (Signed)
Patient ID: [name], female   DOB: 1950/12/26, 67 y.o.   MRN: [id number]       Subjective: Patient feeling much better today.  Has been started on clear liquids and seems to be tolerating them well.  Pain is improved.  Objective: Vital signs in last 24 hours: Temp:  [97.9 F (36.6 C)-100.9 F (38.3 C)] 97.9 F (36.6 C) (12/06 0526) Pulse Rate:  [83-106] 83 (12/06 0526) Resp:  [16-19] 18 (12/06 0526) BP: (117-125)/(58-68) 117/60 (12/06 0526) SpO2:  [97 %-99 %] 97 % (12/06 0526) Weight:  [49 kg] 49 kg (12/05 1313) Last BM Date: 08/12/18  Intake/Output from previous day: 12/05 0701 - 12/06 0700 In: 100 [IV Piggyback:100] Out: 1850 [Urine:1850] Intake/Output this shift: Total I/O In: 800 [P.O.:800] Out: 300 [Urine:300]  PE: Heart: regular Lungs: CTAB Abd: soft, mildly tender in LLQ, +BS, ND  Lab Results:  Recent Labs    08/11/18 1334 08/12/18 0603  WBC 18.8* 12.6*  HGB 13.0 11.3*  HCT 37.7 32.9*  PLT 211 181   BMET Recent Labs    08/11/18 1334 08/12/18 0603  NA 129* 132*  K 2.9* 3.2*  CL 90* 98  CO2 26 22  GLUCOSE 155* 138*  BUN 8 5*  CREATININE 0.73 0.63  CALCIUM 9.0 8.3*   PT/INR No results for input(s): LABPROT, INR in the last 72 hours. CMP     Component Value Date/Time   NA 132 (L) 08/12/2018 0603   K 3.2 (L) 08/12/2018 0603   CL 98 08/12/2018 0603   CO2 22 08/12/2018 0603   GLUCOSE 138 (H) 08/12/2018 0603   BUN 5 (L) 08/12/2018 0603   CREATININE 0.63 08/12/2018 0603   CALCIUM 8.3 (L) 08/12/2018 0603   PROT 7.3 08/11/2018 1334   ALBUMIN 4.5 08/11/2018 1334   AST 23 08/11/2018 1334   ALT 24 08/11/2018 1334   ALKPHOS 68 08/11/2018 1334   BILITOT 1.2 08/11/2018 1334   GFRNONAA >60 08/12/2018 0603   GFRAA >60 08/12/2018 0603   Lipase     Component Value Date/Time   LIPASE 26 08/11/2018 1334       Studies/Results: Ct Abdomen Pelvis W Contrast  Result Date: 08/11/2018 CLINICAL DATA:  Abdominal and pelvic pain since  08/07/2018. Question diverticulitis. Creatinine was obtained on site at [facility] at [address] Results: Creatinine 0.7 mg/dL. EXAM: CT ABDOMEN AND PELVIS WITH CONTRAST TECHNIQUE: Multidetector CT imaging of the abdomen and pelvis was performed using the standard protocol following bolus administration of intravenous contrast. CONTRAST:  100 mL ISOVUE-300 IOPAMIDOL (ISOVUE-300) INJECTION 61% COMPARISON:  CT abdomen and pelvis 04/29/2005. FINDINGS: Lower chest: Minimal basilar atelectasis is noted. No pleural or pericardial effusion. Hepatobiliary: No focal liver abnormality is seen. Status post cholecystectomy. No biliary dilatation. Pancreas: Unremarkable. No pancreatic ductal dilatation or surrounding inflammatory changes. Spleen: Splenic cyst measuring 3.7 cm is unchanged. The spleen is normal in size. Adrenals/Urinary Tract: Scar in the upper pole of the right kidney is again seen with dilatation of the underlying calyx, unchanged. The kidneys otherwise appear normal. Ureters and urinary bladder are normal. Adrenal glands are normal. Stomach/Bowel: The patient has sigmoid diverticulosis with extensive inflammatory change about the mid to distal sigmoid colon. A contained perforation with abscess formation measures 2.6 cm transverse x 1.8 cm AP x 1.7 cm craniocaudal and is contiguous with the anterior wall of the mid sigmoid colon. In the right pelvis, a second small abscess measures 1 cm craniocaudal x 1.4 cm AP  x 1.5 cm transverse. No drainable abscess is identified. The colon is otherwise unremarkable. The appendix has been removed. The stomach and small bowel appear normal. Vascular/Lymphatic: Aortic atherosclerosis. No enlarged abdominal or pelvic lymph nodes. Reproductive: Small calcified uterine fibroid noted. Adnexa unremarkable. Other: None. Musculoskeletal: No fracture or worrisome lesion. There is convex left lumbar scoliosis and multilevel degenerative change. IMPRESSION: Acute  sigmoid diverticulitis with 2 small abscesses identified. One of these contiguous with the anterior wall of the sigmoid and compatible with a contained perforation. No drainable collection. Atherosclerosis. These results will be called to the ordering clinician or representative by the Radiologist Assistant, and communication documented in the PACS or zVision Dashboard. Electronically Signed   By: Inge Rise M.D.   On: 08/11/2018 12:11    Anti-infectives: Anti-infectives (From admission, onward)   Start     Dose/Rate Route Frequency Ordered Stop   08/11/18 2200  piperacillin-tazobactam (ZOSYN) IVPB 3.375 g     3.375 g 12.5 mL/hr over 240 Minutes Intravenous Every 8 hours 08/11/18 1455     08/11/18 1800  piperacillin-tazobactam (ZOSYN) IVPB 3.375 g  Status:  Discontinued     3.375 g 100 mL/hr over 30 Minutes Intravenous Every 6 hours 08/11/18 1628 08/11/18 1636   08/11/18 1500  piperacillin-tazobactam (ZOSYN) IVPB 3.375 g     3.375 g 100 mL/hr over 30 Minutes Intravenous STAT 08/11/18 1453 08/11/18 1606   08/11/18 1400  ciprofloxacin (CIPRO) IVPB 400 mg  Status:  Discontinued     400 mg 200 mL/hr over 60 Minutes Intravenous  Once 08/11/18 1353 08/11/18 1453   08/11/18 1400  metroNIDAZOLE (FLAGYL) IVPB 500 mg     500 mg 100 mL/hr over 60 Minutes Intravenous  Once 08/11/18 1353 08/11/18 1519       Assessment/Plan Hx hypertension Selective IgA immunodeficiency  Non celiac gluten sensitivity Occasional constipation Anemia Dyslipidemia  Acute sigmoid diverticulitis with 2 abscesses  -patient improving with conservative management -agree with clear liquid diet -cont IV abx therapy for now  FEN - IVFs/CLD VTE - Lovenox ID - [id number] -->   LOS: 1 day    Henreitta Cea , [facility] 08/12/2018, 12:39 PM Pager: [phone]

## 2018-08-12 NOTE — Progress Notes (Signed)
PROGRESS NOTE  AMA MCMASTER JIR:[id number] DOB: 09-08-1950 DOA: 08/11/2018 PCP: [name], MD  HPI/Recap of past 24 hours:  [name] is a 67 y.o. female with medical history significant dyslipidemia, hypertension, pernicious anemia, irritable bowel syndrome, diverticulosis and anxiety.  Patient reported 5 days of worsening abdominal pain, localized in the lower quadrants, colicky in nature, severe in intensity, worse with movement, improved with fasting, associated with poor appetite and loose stools, no hematochezia or melena.  No nausea or vomiting.  Symptoms have been worsening in intensity and frequency.  She also has noticed fevers and chills.  She had appendectomy about 13 years ago, she is known to have diverticulosis.  08/12/2018: Patient seen and examined at her bedside. States abdominal pain is improving.  Poor appetite but willing to try a clear liquid diet.  Assessment/Plan: Active Problems:   Diverticulitis   Sepsis secondary to acute sigmoid diverticulitis with 2 abscesses, improving Presented with fever with T-max of 100.9, tachycardia, leukocytosis with above findings per CT abdomen and pelvis with contrast done on presentation Continue IV antibiotics empirically on Zosyn General surgery following with recommendation for medical management Currently on clear liquid diet Continue diet as recommended by general surgery Leukocytosis is improving from 18 K to 12 K Monitor fever curve and WBC Repeat CBC in the morning  Euvolemic hyponatremia, improving Could be contributed by significant pain Sodium 132 from 129  Hypokalemia Potassium 3.2 Repleted with p.o. potassium 40 mEq x 2  Hypertension Blood pressures are stable Antihypertensive medications are being held to avoid hypotension  Hyperlipidemia On Pravachol   DVT prophylaxis:   Subcu enoxaparin daily Code Status: full  Family Communication: Updated husband and son at bedside.  All questions  answered to their satisfaction.   Disposition Plan: Home possibly tomorrow 08/13/2018 if she can tolerate a soft diet and general surgery signs off.    Consults called: surgery      Objective: Vitals:   08/11/18 1616 08/11/18 2031 08/12/18 0526 08/12/18 1410  BP: (!) 119/58 125/68 117/60 118/66  Pulse: 93 85 83 72  Resp: 18 19 18    Temp: (!) 100.9 F (38.3 C) 98.4 F (36.9 C) 97.9 F (36.6 C) 98 F (36.7 C)  TempSrc: Oral Oral Oral Oral  SpO2: 99% 98% 97% 100%  Weight:        Intake/Output Summary (Last 24 hours) at 08/12/2018 1645 Last data filed at 08/12/2018 1022 Gross per 24 hour  Intake 800 ml  Output 1500 ml  Net -700 ml   Filed Weights   08/11/18 1313  Weight: 49 kg    Exam:  . General: 67 y.o. year-old female well developed well nourished in no acute distress.  Alert and oriented x3. . Cardiovascular: Regular rate and rhythm with no rubs or gallops.  No thyromegaly or JVD noted.   [name] Respiratory: Clear to auscultation with no wheezes or rales. Good inspiratory effort. . Abdomen: Soft mild tenderness noted in left lower quadrant abdomen on palpation.  Hypoactive bowel sounds x4 quadrant.   . Musculoskeletal: No lower extremity edema. 2/4 pulses in all 4 extremities. . Skin: No ulcerative lesions noted or rashes . Psychiatry: Mood is appropriate for condition and setting   Data Reviewed: CBC: Recent Labs  Lab 08/11/18 1015 08/11/18 1334 08/12/18 0603  WBC 16.7* 18.8* 12.6*  NEUTROABS 14.8*  --   --   HGB 13.1 13.0 11.3*  HCT 38.4 37.7 32.9*  MCV 91.3 92.4 90.9  PLT 202.0  211 196   Basic Metabolic Panel: Recent Labs  Lab 08/11/18 1015 08/11/18 1334 08/12/18 0603  NA 129* 129* 132*  K 3.7 2.9* 3.2*  CL 92* 90* 98  CO2 27 26 22   GLUCOSE 113* 155* 138*  BUN 7 8 5*  CREATININE 0.71 0.73 0.63  CALCIUM 9.5 9.0 8.3*   GFR: Estimated Creatinine Clearance: 50.4 mL/min (by C-G formula based on SCr of 0.63 mg/dL). Liver Function Tests: Recent Labs    Lab 08/11/18 1015 08/11/18 1334  AST 15 23  ALT 20 24  ALKPHOS 66 68  BILITOT 1.3* 1.2  PROT 7.1 7.3  ALBUMIN 4.5 4.5   Recent Labs  Lab 08/11/18 1334  LIPASE 26   No results for input(s): AMMONIA in the last 168 hours. Coagulation Profile: No results for input(s): INR, PROTIME in the last 168 hours. Cardiac Enzymes: No results for input(s): CKTOTAL, CKMB, CKMBINDEX, TROPONINI in the last 168 hours. BNP (last 3 results) No results for input(s): PROBNP in the last 8760 hours. HbA1C: No results for input(s): HGBA1C in the last 72 hours. CBG: No results for input(s): GLUCAP in the last 168 hours. Lipid Profile: No results for input(s): CHOL, HDL, LDLCALC, TRIG, CHOLHDL, LDLDIRECT in the last 72 hours. Thyroid Function Tests: No results for input(s): TSH, T4TOTAL, FREET4, T3FREE, THYROIDAB in the last 72 hours. Anemia Panel: No results for input(s): VITAMINB12, FOLATE, FERRITIN, TIBC, IRON, RETICCTPCT in the last 72 hours. Urine analysis:    Component Value Date/Time   COLORURINE STRAW (A) 08/11/2018 [place] 08/11/2018 1509   LABSPEC 1.026 08/11/2018 1509   PHURINE 7.0 08/11/2018 1509   GLUCOSEU NEGATIVE 08/11/2018 1509   GLUCOSEU NEGATIVE 08/11/2018 1015   HGBUR SMALL (A) 08/11/2018 1509   BILIRUBINUR NEGATIVE 08/11/2018 1509   BILIRUBINUR negative 01/10/2015 1456   KETONESUR 5 (A) 08/11/2018 1509   PROTEINUR NEGATIVE 08/11/2018 1509   UROBILINOGEN 0.2 08/11/2018 1015   NITRITE NEGATIVE 08/11/2018 1509   LEUKOCYTESUR NEGATIVE 08/11/2018 1509   Sepsis Labs: @LABRCNTIP (procalcitonin:4,lacticidven:4)  )No results found for this or any previous visit (from the past 240 hour(s)).    Studies: No results found.  Scheduled Meds: . calcium-vitamin D  1 tablet Oral Q breakfast  . cholecalciferol  1,000 Units Oral Daily  . enoxaparin (LOVENOX) injection  40 mg Subcutaneous Q24H  . pravastatin  40 mg Oral Daily    Continuous Infusions: .  dextrose 5 % and 0.9% NaCl 75 mL/hr at 08/12/18 1449  . famotidine (PEPCID) IV 20 mg (08/12/18 0024)  . piperacillin-tazobactam (ZOSYN)  IV 3.375 g (08/12/18 1452)     LOS: 1 day     Kayleen Memos, MD [facility] Pager [phone]  If 7PM-7AM, please contact night-coverage [email or web address] Password [email or web address] 08/12/2018, 4:45 PM

## 2018-08-13 DIAGNOSIS — E871 Hypo-osmolality and hyponatremia: Secondary | ICD-10-CM

## 2018-08-13 LAB — MAGNESIUM: Magnesium: 2.2 mg/dL (ref 1.7–2.4)

## 2018-08-13 LAB — BASIC METABOLIC PANEL
Anion gap: 9 (ref 5–15)
BUN: <5 mg/dL — ABNORMAL LOW (ref 8–23)
CO2: 24 mmol/L (ref 22–32)
Calcium: 8.5 mg/dL — ABNORMAL LOW (ref 8.9–10.3)
Chloride: 103 mmol/L (ref 98–111)
Creatinine, Ser: 0.54 mg/dL (ref 0.44–1.00)
GFR calc Af Amer: >60 mL/min (ref >60)
Glucose, Bld: 114 mg/dL — ABNORMAL HIGH (ref 70–99)
Potassium: 3.5 mmol/L (ref 3.5–5.1)
Sodium: 136 mmol/L (ref 135–145)

## 2018-08-13 LAB — CBC WITH DIFFERENTIAL/PLATELET
Abs Immature Granulocytes: 0.03 10*3/uL (ref 0.00–0.07)
Basophils Absolute: 0 10*3/uL (ref 0.0–0.1)
Basophils Relative: 0 %
Eosinophils Absolute: 0.1 10*3/uL (ref 0.0–0.5)
Eosinophils Relative: 1 %
HCT: 32.3 % — ABNORMAL LOW (ref 36.0–46.0)
Hemoglobin: 10.8 g/dL — ABNORMAL LOW (ref 12.0–15.0)
Immature Granulocytes: 0 %
Lymphocytes Relative: 12 %
Lymphs Abs: 0.9 10*3/uL (ref 0.7–4.0)
MCH: 31.7 pg (ref 26.0–34.0)
MCHC: 33.4 g/dL (ref 30.0–36.0)
MCV: 94.7 fL (ref 80.0–100.0)
Monocytes Absolute: 0.8 10*3/uL (ref 0.1–1.0)
Monocytes Relative: 11 %
Neutro Abs: 5.8 10*3/uL (ref 1.7–7.7)
Neutrophils Relative %: 76 %
Platelets: 200 10*3/uL (ref 150–400)
RBC: 3.41 MIL/uL — ABNORMAL LOW (ref 3.87–5.11)
RDW: 12 % (ref 11.5–15.5)
WBC: 7.6 10*3/uL (ref 4.0–10.5)
nRBC: 0 % (ref 0.0–0.2)

## 2018-08-13 MED ORDER — SODIUM CHLORIDE 0.9 % IV SOLN
1.5000 g | Freq: Four times a day (QID) | INTRAVENOUS | Status: DC
  Administered 2018-08-13 – 2018-08-14 (×4): 1.5 g via INTRAVENOUS
  Filled 2018-08-13 (×4): qty 1.5

## 2018-08-13 MED ORDER — AMOXICILLIN-POT CLAVULANATE 875-125 MG PO TABS
1.0000 | ORAL_TABLET | Freq: Two times a day (BID) | ORAL | Status: DC
  Filled 2018-08-13 (×2): qty 1

## 2018-08-13 MED ORDER — AMLODIPINE BESYLATE 10 MG PO TABS
10.0000 mg | ORAL_TABLET | Freq: Every day | ORAL | Status: DC
  Administered 2018-08-14: 10 mg via ORAL
  Filled 2018-08-13: qty 1

## 2018-08-13 MED ORDER — HYDROCHLOROTHIAZIDE 25 MG PO TABS
12.5000 mg | ORAL_TABLET | Freq: Every day | ORAL | Status: DC
  Administered 2018-08-14: 12.5 mg via ORAL
  Filled 2018-08-13: qty 1

## 2018-08-13 MED ORDER — FAMOTIDINE 20 MG PO TABS
20.0000 mg | ORAL_TABLET | Freq: Every day | ORAL | Status: DC
  Administered 2018-08-13: 20 mg via ORAL
  Filled 2018-08-13: qty 1

## 2018-08-13 NOTE — Progress Notes (Signed)
PROGRESS NOTE    [name]  DJT:[id number] DOB: January 04, 1951 DOA: 08/11/2018 PCP: [name], MD      Brief Narrative:  [name] is a 67 y.o. F with HTN, anemia who presents with lower abdominal pain, found on CT to have diverticulitis with two 2 cm abscesses.  Admitted for IV antibiotics.   Assessment & Plan:  Sepsis from Acute sigmoid diverticulitis Sepsis ruled in.  Presented with fever, tachycardia, leukocytosis from diverticulitis on CT.  sTarted on Zosyn. -Continue IV antibiotics, may narrow to Unasyn -Advance diet as tolerated -Appreciate general surgery consultation -Follow up with Gen Surg as outpatient, with repeat imaging per GS   Hypertension Blood pressure trending up -Restart amlodipine, HCTZ tomorrow -Continue pravastatin  Hypokalemia Supplemented  Hyponatremia Resolved  Anemia, normocytic Hgb stable relative to baseline -Continue B12 at discharge  Other medciations -Continue Pepcid        MDM and disposition: The below labs and imaging reports were reviewed and summarized above.  Medication management as above.  The patient was admitted with diverticulitis and sepsis.  She is improving, has not yet tolerated advancing her diet.  We will trial soft foods today, continue IV antibiotics.  If tolerates advancing diet, to home tomorrow with Augmentin.          DVT prophylaxis: Lovenox Code Status: FULL Family Communication: Husband    Consultants:   General Surgery  Procedures:   None  Antimicrobials:   Zosyn 12/5 >> 12/7  Unasyn 12/7 >>    Subjective: Abdominal Pain mostly resolved.  No nausea.  No fever.  No confusion, vomiting.  Some loose stools.  Objective: Vitals:   08/12/18 1410 08/12/18 2207 08/13/18 0556 08/13/18 1425  BP: 118/66 139/72 120/68 (!) 139/58  Pulse: 72 91 72 76  Resp:  16 13   Temp: 98 F (36.7 C) 98.2 F (36.8 C) 98.4 F (36.9 C) 98.2 F (36.8 C)  TempSrc: Oral Oral Oral Oral  SpO2:  100% 100% 100% 100%  Weight:        Intake/Output Summary (Last 24 hours) at 08/13/2018 1824 Last data filed at 08/13/2018 1650 Gross per 24 hour  Intake 1007.5 ml  Output 2600 ml  Net -1592.5 ml   Filed Weights   08/11/18 1313  Weight: 49 kg    Examination: General appearance: thin adult female, alert and in no acute distress.   HEENT: Anicteric, conjunctiva pink, lids and lashes normal. No nasal deformity, discharge, epistaxis.  Lips moist.   Skin: Warm and dry.  no jaundice.  No suspicious rashes or lesions. Cardiac: RRR, nl S1-S2, no murmurs appreciated.  Capillary refill is brisk.  JVP not visible.  No LE edema.  Radia  pulses 2+ and symmetric. Respiratory: Normal respiratory rate and rhythm.  CTAB without rales or wheezes. Abdomen: Abdomen soft.  no TTP. No ascites, distension, hepatosplenomegaly.   MSK: No deformities or effusions. Neuro: Awake and alert.  EOMI, moves all extremities. Speech fluent.    Psych: Sensorium intact and responding to questions, attention normal. Affect normal.  Judgment and insight appear normal.    Data Reviewed: I have personally reviewed following labs and imaging studies:  CBC: Recent Labs  Lab 08/11/18 1015 08/11/18 1334 08/12/18 0603 08/13/18 0419  WBC 16.7* 18.8* 12.6* 7.6  NEUTROABS 14.8*  --   --  5.8  HGB 13.1 13.0 11.3* 10.8*  HCT 38.4 37.7 32.9* 32.3*  MCV 91.3 92.4 90.9 94.7  PLT 202.0 211 181 200  Basic Metabolic Panel: Recent Labs  Lab 08/11/18 1015 08/11/18 1334 08/12/18 0603 08/13/18 0419  NA 129* 129* 132* 136  K 3.7 2.9* 3.2* 3.5  CL 92* 90* 98 103  CO2 27 26 22 24   GLUCOSE 113* 155* 138* 114*  BUN 7 8 5* <5*  CREATININE 0.71 0.73 0.63 0.54  CALCIUM 9.5 9.0 8.3* 8.5*  MG  --   --   --  2.2   GFR: Estimated Creatinine Clearance: 50.4 mL/min (by C-G formula based on SCr of 0.54 mg/dL). Liver Function Tests: Recent Labs  Lab 08/11/18 1015 08/11/18 1334  AST 15 23  ALT 20 24  ALKPHOS 66 68  BILITOT  1.3* 1.2  PROT 7.1 7.3  ALBUMIN 4.5 4.5   Recent Labs  Lab 08/11/18 1334  LIPASE 26   No results for input(s): AMMONIA in the last 168 hours. Coagulation Profile: No results for input(s): INR, PROTIME in the last 168 hours. Cardiac Enzymes: No results for input(s): CKTOTAL, CKMB, CKMBINDEX, TROPONINI in the last 168 hours. BNP (last 3 results) No results for input(s): PROBNP in the last 8760 hours. HbA1C: No results for input(s): HGBA1C in the last 72 hours. CBG: No results for input(s): GLUCAP in the last 168 hours. Lipid Profile: No results for input(s): CHOL, HDL, LDLCALC, TRIG, CHOLHDL, LDLDIRECT in the last 72 hours. Thyroid Function Tests: No results for input(s): TSH, T4TOTAL, FREET4, T3FREE, THYROIDAB in the last 72 hours. Anemia Panel: No results for input(s): VITAMINB12, FOLATE, FERRITIN, TIBC, IRON, RETICCTPCT in the last 72 hours. Urine analysis:    Component Value Date/Time   COLORURINE STRAW (A) 08/11/2018 1509   APPEARANCEUR CLEAR 08/11/2018 1509   LABSPEC 1.026 08/11/2018 1509   PHURINE 7.0 08/11/2018 1509   GLUCOSEU NEGATIVE 08/11/2018 1509   GLUCOSEU NEGATIVE 08/11/2018 1015   HGBUR SMALL (A) 08/11/2018 1509   BILIRUBINUR NEGATIVE 08/11/2018 1509   BILIRUBINUR negative 01/10/2015 1456   KETONESUR 5 (A) 08/11/2018 1509   PROTEINUR NEGATIVE 08/11/2018 1509   UROBILINOGEN 0.2 08/11/2018 1015   NITRITE NEGATIVE 08/11/2018 1509   LEUKOCYTESUR NEGATIVE 08/11/2018 1509   Sepsis Labs: @LABRCNTIP (procalcitonin:4,lacticacidven:4)  ) Recent Results (from the past 240 hour(s))  Urine Culture     Status: None   Collection Time: 08/11/18 10:15 AM  Result Value Ref Range Status   MICRO NUMBER: [id number]  Final   SPECIMEN QUALITY: Adequate  Final   Sample Source NOT GIVEN  Final   STATUS: FINAL  Final   ISOLATE 1:   Final    Single organism less than 10,000 CFU/mL isolated. These organisms, commonly found on external and internal genitalia, are considered  colonizers. No further testing performed.         Radiology Studies: No results found.      Scheduled Meds: . calcium-vitamin D  1 tablet Oral Q breakfast  . cholecalciferol  1,000 Units Oral Daily  . enoxaparin (LOVENOX) injection  40 mg Subcutaneous Q24H  . famotidine  20 mg Oral QHS  . pravastatin  40 mg Oral Daily   Continuous Infusions: . ampicillin-sulbactam (UNASYN) IV 1.5 g (08/13/18 1610)     LOS: 2 days    Time spent: 25 minutes    Edwin Dada, MD [facility] 08/13/2018, 6:24 PM     Please page through [place]:  [email or web address] Password [email or web address] If 7PM-7AM, please contact night-coverage

## 2018-08-13 NOTE — Progress Notes (Signed)
Subjective/Chief Complaint: Feeling good. No complaints   Objective: Vital signs in last 24 hours: Temp:  [98 F (36.7 C)-98.4 F (36.9 C)] 98.4 F (36.9 C) (12/07 0556) Pulse Rate:  [72-91] 72 (12/07 0556) Resp:  [13-16] 13 (12/07 0556) BP: (118-139)/(66-72) 120/68 (12/07 0556) SpO2:  [100 %] 100 % (12/07 0556) Last BM Date: 08/12/18  Intake/Output from previous day: 12/06 0701 - 12/07 0700 In: 1160 [P.O.:1160] Out: 2400 [Urine:2400] Intake/Output this shift: No intake/output data recorded.  General appearance: alert and cooperative Resp: clear to auscultation bilaterally Cardio: regular rate and rhythm GI: soft, nontender  Lab Results:  Recent Labs    08/12/18 0603 08/13/18 0419  WBC 12.6* 7.6  HGB 11.3* 10.8*  HCT 32.9* 32.3*  PLT 181 200   BMET Recent Labs    08/12/18 0603 08/13/18 0419  NA 132* 136  K 3.2* 3.5  CL 98 103  CO2 22 24  GLUCOSE 138* 114*  BUN 5* <5*  CREATININE 0.63 0.54  CALCIUM 8.3* 8.5*   PT/INR No results for input(s): LABPROT, INR in the last 72 hours. ABG No results for input(s): PHART, HCO3 in the last 72 hours.  Invalid input(s): PCO2, PO2  Studies/Results: Ct Abdomen Pelvis W Contrast  Result Date: 08/11/2018 CLINICAL DATA:  Abdominal and pelvic pain since 08/07/2018. Question diverticulitis. Creatinine was obtained on site at [place] at [address] Results: Creatinine 0.7 mg/dL. EXAM: CT ABDOMEN AND PELVIS WITH CONTRAST TECHNIQUE: Multidetector CT imaging of the abdomen and pelvis was performed using the standard protocol following bolus administration of intravenous contrast. CONTRAST:  100 mL ISOVUE-300 IOPAMIDOL (ISOVUE-300) INJECTION 61% COMPARISON:  CT abdomen and pelvis 04/29/2005. FINDINGS: Lower chest: Minimal basilar atelectasis is noted. No pleural or pericardial effusion. Hepatobiliary: No focal liver abnormality is seen. Status post cholecystectomy. No biliary dilatation. Pancreas:  Unremarkable. No pancreatic ductal dilatation or surrounding inflammatory changes. Spleen: Splenic cyst measuring 3.7 cm is unchanged. The spleen is normal in size. Adrenals/Urinary Tract: Scar in the upper pole of the right kidney is again seen with dilatation of the underlying calyx, unchanged. The kidneys otherwise appear normal. Ureters and urinary bladder are normal. Adrenal glands are normal. Stomach/Bowel: The patient has sigmoid diverticulosis with extensive inflammatory change about the mid to distal sigmoid colon. A contained perforation with abscess formation measures 2.6 cm transverse x 1.8 cm AP x 1.7 cm craniocaudal and is contiguous with the anterior wall of the mid sigmoid colon. In the right pelvis, a second small abscess measures 1 cm craniocaudal x 1.4 cm AP x 1.5 cm transverse. No drainable abscess is identified. The colon is otherwise unremarkable. The appendix has been removed. The stomach and small bowel appear normal. Vascular/Lymphatic: Aortic atherosclerosis. No enlarged abdominal or pelvic lymph nodes. Reproductive: Small calcified uterine fibroid noted. Adnexa unremarkable. Other: None. Musculoskeletal: No fracture or worrisome lesion. There is convex left lumbar scoliosis and multilevel degenerative change. IMPRESSION: Acute sigmoid diverticulitis with 2 small abscesses identified. One of these contiguous with the anterior wall of the sigmoid and compatible with a contained perforation. No drainable collection. Atherosclerosis. These results will be called to the ordering clinician or representative by the Radiologist Assistant, and communication documented in the PACS or zVision Dashboard. Electronically Signed   By: Inge Rise M.D.   On: 08/11/2018 12:11    Anti-infectives: Anti-infectives (From admission, onward)   Start     Dose/Rate Route Frequency Ordered Stop   08/13/18 1000  amoxicillin-clavulanate (AUGMENTIN) 875-125 MG per  tablet 1 tablet     1 tablet Oral Every 12  hours 08/13/18 0835     08/11/18 2200  piperacillin-tazobactam (ZOSYN) IVPB 3.375 g     3.375 g 12.5 mL/hr over 240 Minutes Intravenous Every 8 hours 08/11/18 1455     08/11/18 1800  piperacillin-tazobactam (ZOSYN) IVPB 3.375 g  Status:  Discontinued     3.375 g 100 mL/hr over 30 Minutes Intravenous Every 6 hours 08/11/18 1628 08/11/18 1636   08/11/18 1500  piperacillin-tazobactam (ZOSYN) IVPB 3.375 g     3.375 g 100 mL/hr over 30 Minutes Intravenous STAT 08/11/18 1453 08/11/18 1606   08/11/18 1400  ciprofloxacin (CIPRO) IVPB 400 mg  Status:  Discontinued     400 mg 200 mL/hr over 60 Minutes Intravenous  Once 08/11/18 1353 08/11/18 1453   08/11/18 1400  metroNIDAZOLE (FLAGYL) IVPB 500 mg     500 mg 100 mL/hr over 60 Minutes Intravenous  Once 08/11/18 1353 08/11/18 1519      Assessment/Plan: s/p * No surgery found * Advance diet  Switch to oral abx for diverticulitis If she continues to improve then plan for CT as outpt to follow up on abscesses  LOS: 2 days    Autumn Messing III 08/13/2018

## 2018-08-14 LAB — CBC
HCT: 34.1 % — ABNORMAL LOW (ref 36.0–46.0)
Hemoglobin: 11.2 g/dL — ABNORMAL LOW (ref 12.0–15.0)
MCH: 30.9 pg (ref 26.0–34.0)
MCHC: 32.8 g/dL (ref 30.0–36.0)
MCV: 94.2 fL (ref 80.0–100.0)
PLATELETS: 219 10*3/uL (ref 150–400)
RBC: 3.62 MIL/uL — ABNORMAL LOW (ref 3.87–5.11)
RDW: 11.9 % (ref 11.5–15.5)
WBC: 5.1 10*3/uL (ref 4.0–10.5)
nRBC: 0 % (ref 0.0–0.2)

## 2018-08-14 LAB — BASIC METABOLIC PANEL
Anion gap: 8 (ref 5–15)
BUN: <5 mg/dL — ABNORMAL LOW (ref 8–23)
CO2: 24 mmol/L (ref 22–32)
Calcium: 8.8 mg/dL — ABNORMAL LOW (ref 8.9–10.3)
Chloride: 105 mmol/L (ref 98–111)
Creatinine, Ser: 0.59 mg/dL (ref 0.44–1.00)
GFR calc Af Amer: >60 mL/min (ref >60)
GFR calc non Af Amer: >60 mL/min (ref >60)
Glucose, Bld: 98 mg/dL (ref 70–99)
Potassium: 3.5 mmol/L (ref 3.5–5.1)
Sodium: 137 mmol/L (ref 135–145)

## 2018-08-14 MED ORDER — DICLOFENAC SODIUM 75 MG PO TBEC: 75.0000 mg | DELAYED_RELEASE_TABLET | Freq: Two times a day (BID) | ORAL | Status: DC | PRN

## 2018-08-14 MED ORDER — AMOXICILLIN-POT CLAVULANATE 875-125 MG PO TABS: 1.0000 | ORAL_TABLET | Freq: Two times a day (BID) | ORAL | 0 refills | Status: AC

## 2018-08-14 NOTE — Progress Notes (Signed)
   Subjective/Chief Complaint: No complaints   Objective: Vital signs in last 24 hours: Temp:  [98.2 F (36.8 C)-98.5 F (36.9 C)] 98.5 F (36.9 C) (12/08 0431) Pulse Rate:  [74-83] 74 (12/08 0431) Resp:  [16] 16 (12/08 0431) BP: (134-139)/(58-78) 139/78 (12/08 0431) SpO2:  [99 %-100 %] 99 % (12/08 0431) Last BM Date: 08/13/18  Intake/Output from previous day: 12/07 0701 - 12/08 0700 In: 1304.3 [P.O.:800; I.V.:150; IV Piggyback:354.3] Out: 3800 [Urine:3800] Intake/Output this shift: No intake/output data recorded.  General appearance: alert and cooperative Resp: clear to auscultation bilaterally Cardio: regular rate and rhythm GI: soft, nontender  Lab Results:  Recent Labs    08/13/18 0419 08/14/18 0418  WBC 7.6 5.1  HGB 10.8* 11.2*  HCT 32.3* 34.1*  PLT 200 219   BMET Recent Labs    08/13/18 0419 08/14/18 0418  NA 136 137  K 3.5 3.5  CL 103 105  CO2 24 24  GLUCOSE 114* 98  BUN <5* <5*  CREATININE 0.54 0.59  CALCIUM 8.5* 8.8*   PT/INR No results for input(s): LABPROT, INR in the last 72 hours. ABG No results for input(s): PHART, HCO3 in the last 72 hours.  Invalid input(s): PCO2, PO2  Studies/Results: No results found.  Anti-infectives: Anti-infectives (From admission, onward)   Start     Dose/Rate Route Frequency Ordered Stop   08/13/18 1600  ampicillin-sulbactam (UNASYN) 1.5 g in sodium chloride 0.9 % 100 mL IVPB     1.5 g 200 mL/hr over 30 Minutes Intravenous Every 6 hours 08/13/18 1140     08/13/18 1200  amoxicillin-clavulanate (AUGMENTIN) 875-125 MG per tablet 1 tablet  Status:  Discontinued     1 tablet Oral Every 12 hours 08/13/18 0835 08/13/18 1140   08/11/18 2200  piperacillin-tazobactam (ZOSYN) IVPB 3.375 g  Status:  Discontinued     3.375 g 12.5 mL/hr over 240 Minutes Intravenous Every 8 hours 08/11/18 1455 08/13/18 0843   08/11/18 1800  piperacillin-tazobactam (ZOSYN) IVPB 3.375 g  Status:  Discontinued     3.375 g 100 mL/hr  over 30 Minutes Intravenous Every 6 hours 08/11/18 1628 08/11/18 1636   08/11/18 1500  piperacillin-tazobactam (ZOSYN) IVPB 3.375 g     3.375 g 100 mL/hr over 30 Minutes Intravenous STAT 08/11/18 1453 08/11/18 1606   08/11/18 1400  ciprofloxacin (CIPRO) IVPB 400 mg  Status:  Discontinued     400 mg 200 mL/hr over 60 Minutes Intravenous  Once 08/11/18 1353 08/11/18 1453   08/11/18 1400  metroNIDAZOLE (FLAGYL) IVPB 500 mg     500 mg 100 mL/hr over 60 Minutes Intravenous  Once 08/11/18 1353 08/11/18 1519      Assessment/Plan: s/p * No surgery found * Advance diet  Continue oral abx for at least 2 weeks until she comes back to see [place] in clinic She will need follow up CT as outpt to evaluate abscess Ok for discharge from surgical standpoint  LOS: 3 days    [name] 08/14/2018

## 2018-08-14 NOTE — Progress Notes (Signed)
Pt was discharged home today. Instructions were reviewed with patient, and questions were answered. Pt was taken to main entrance via wheelchair by NT.  

## 2018-08-14 NOTE — Discharge Summary (Signed)
Physician Discharge Summary  AZHA CONSTANTIN KKX:[id number] DOB: 1950-10-09 DOA: 08/11/2018  PCP: [name], MD  Admit date: 08/11/2018 Discharge date: 08/14/2018  Admitted From: Home  Disposition:  Home   Recommendations for Outpatient Follow-up:  1. Follow up with General Surgery in 1 week 2. General Surgery to coordinate necessary repeat CT abdomen    Home Health: None  Equipment/Devices: None  Discharge Condition: Good  CODE STATUS: FULL Diet recommendation: Soft  Brief/Interim Summary: Mrs. Travaglini is a 67 y.o. F with HTN, anemia who presents with lower abdominal pain, found on CT to have diverticulitis with two 2 cm abscesses.  Admitted for IV antibiotics.      PRINCIPAL HOSPITAL DIAGNOSIS: Diverticulitis with microperforation and small abscess    Discharge Diagnoses:   Sepsis from Acute sigmoid diverticulitis with abscess Presented with fever, tachycardia, leukocytosis.  CT at admission showed diverticulitis with small 1-2 cm abscesses.  Started on Zosyn.  Quickly defervesced.  WBC normalized, pain completely resolved.  Able to advance diet without pain.  Transitioned to oral antibiotics.  Will follow up with General Surgery as an outpatient for repeat CT abdomen.    Hypertension  Hypokalemia Supplemented  Hyponatremia Present on admission, resolved with fluids.    Anemia, normocytic Hgb stable relative to baseline.               Discharge Instructions  Discharge Instructions    Diet general   Complete by:  As directed    Discharge instructions   Complete by:  As directed    From Dr. Loleta Books: You were admitted for diverticulitis. You were treated with IV antibiotics and have improved very well.  Continue antibiotics with: Augmentin 875-125 mg twice daily starting today Take for 10 days Call Dr. Ethlyn Gallery office for a follow up appointment with him or with Dr. Excell Seltzer whom you saw in the ER. Their office is [facility], at [phone]   Stick with bland "low residue" foods for now.  Bananas, rice, toast, apple sauce, broth.  Consider avoiding seeds and nuts for now.   Increase activity slowly   Complete by:  As directed      Allergies as of 08/14/2018      Reactions   Fosamax [alendronate Sodium]    GI intolerance remotely   Hydrocodone-acetaminophen    REACTION: itching   Oxycodone-acetaminophen    REACTION: itching      Medication List    TAKE these medications   ALPRAZolam 0.25 MG tablet Commonly known as:  XANAX Take 1 tablet (0.25 mg total) by mouth 2 (two) times daily as needed for anxiety.   amLODipine 10 MG tablet Commonly known as:  NORVASC TAKE 1 TABLET BY MOUTH EVERY DAY   amoxicillin-clavulanate 875-125 MG tablet Commonly known as:  AUGMENTIN Take 1 tablet by mouth 2 (two) times daily for 10 days.   calcium-vitamin D 500-200 MG-UNIT tablet Commonly known as:  OSCAL WITH D Take 1 tablet by mouth daily with breakfast.   cholecalciferol 25 MCG (1000 UT) tablet Commonly known as:  VITAMIN D3 Take 1,000 Units by mouth daily.   diclofenac 75 MG EC tablet Commonly known as:  VOLTAREN Take 1 tablet (75 mg total) by mouth 2 (two) times daily as needed for mild pain. What changed:    how much to take  how to take this  when to take this  reasons to take this  additional instructions   hydrochlorothiazide 12.5 MG tablet Commonly known as:  HYDRODIURIL TAKE 1 TABLET BY MOUTH EVERY DAY   IBUPROFEN PM 200-25 MG Caps Generic drug:  Ibuprofen-diphenhydrAMINE HCl Take 1 tablet by mouth at bedtime as needed (pain/sleep).   metaxalone 800 MG tablet Commonly known as:  SKELAXIN TAKE 1 TABLET (800 MG TOTAL) BY MOUTH EVERY 8 (EIGHT) HOURS AS NEEDED FOR MUSCLE SPASMS. What changed:  how much to take   nitrofurantoin (macrocrystal-monohydrate) 100 MG capsule Commonly known as:  MACROBID TAKE 1 CAPSULE BY MOUTH TWICE A DAY   pravastatin 40 MG tablet Commonly  known as:  PRAVACHOL TAKE 1 TABLET BY MOUTH EVERY DAY   pseudoephedrine 30 MG tablet Commonly known as:  SUDAFED Take 30 mg by mouth every 4 (four) hours as needed for congestion.   vitamin B-12 1000 MCG tablet Commonly known as:  CYANOCOBALAMIN Take 1 tablet (1,000 mcg total) by mouth daily.       Allergies  Allergen Reactions  . Fosamax [Alendronate Sodium]     GI intolerance remotely  . Hydrocodone-Acetaminophen     REACTION: itching  . Oxycodone-Acetaminophen     REACTION: itching    Consultations:  General Surgery   Procedures/Studies: Ct Abdomen Pelvis W Contrast  Result Date: 08/11/2018 CLINICAL DATA:  Abdominal and pelvic pain since 08/07/2018. Question diverticulitis. Creatinine was obtained on site at [facility] at [address] Results: Creatinine 0.7 mg/dL. EXAM: CT ABDOMEN AND PELVIS WITH CONTRAST TECHNIQUE: Multidetector CT imaging of the abdomen and pelvis was performed using the standard protocol following bolus administration of intravenous contrast. CONTRAST:  100 mL ISOVUE-300 IOPAMIDOL (ISOVUE-300) INJECTION 61% COMPARISON:  CT abdomen and pelvis 04/29/2005. FINDINGS: Lower chest: Minimal basilar atelectasis is noted. No pleural or pericardial effusion. Hepatobiliary: No focal liver abnormality is seen. Status post cholecystectomy. No biliary dilatation. Pancreas: Unremarkable. No pancreatic ductal dilatation or surrounding inflammatory changes. Spleen: Splenic cyst measuring 3.7 cm is unchanged. The spleen is normal in size. Adrenals/Urinary Tract: Scar in the upper pole of the right kidney is again seen with dilatation of the underlying calyx, unchanged. The kidneys otherwise appear normal. Ureters and urinary bladder are normal. Adrenal glands are normal. Stomach/Bowel: The patient has sigmoid diverticulosis with extensive inflammatory change about the mid to distal sigmoid colon. A contained perforation with abscess formation measures 2.6 cm  transverse x 1.8 cm AP x 1.7 cm craniocaudal and is contiguous with the anterior wall of the mid sigmoid colon. In the right pelvis, a second small abscess measures 1 cm craniocaudal x 1.4 cm AP x 1.5 cm transverse. No drainable abscess is identified. The colon is otherwise unremarkable. The appendix has been removed. The stomach and small bowel appear normal. Vascular/Lymphatic: Aortic atherosclerosis. No enlarged abdominal or pelvic lymph nodes. Reproductive: Small calcified uterine fibroid noted. Adnexa unremarkable. Other: None. Musculoskeletal: No fracture or worrisome lesion. There is convex left lumbar scoliosis and multilevel degenerative change. IMPRESSION: Acute sigmoid diverticulitis with 2 small abscesses identified. One of these contiguous with the anterior wall of the sigmoid and compatible with a contained perforation. No drainable collection. Atherosclerosis. These results will be called to the ordering clinician or representative by the Radiologist Assistant, and communication documented in the PACS or zVision Dashboard. Electronically Signed   By: Inge Rise M.D.   On: 08/11/2018 12:11       Subjective: Feeling well.  No abdominal pain.  No confusion, fever, vomiting.  Some loose stools but nothing frequent.    Discharge Exam: Vitals:   08/13/18 2119 08/14/18 0431  BP: 134/77  139/78  Pulse: 83 74  Resp: 16 16  Temp: 98.2 F (36.8 C) 98.5 F (36.9 C)  SpO2: 100% 99%   Vitals:   08/13/18 0556 08/13/18 1425 08/13/18 2119 08/14/18 0431  BP: 120/68 (!) 139/58 134/77 139/78  Pulse: 72 76 83 74  Resp: 13  16 16   Temp: 98.4 F (36.9 C) 98.2 F (36.8 C) 98.2 F (36.8 C) 98.5 F (36.9 C)  TempSrc: Oral Oral Oral Oral  SpO2: 100% 100% 100% 99%  Weight:      Height:   5\' 2"  (1.575 m)     General: Pt is alert, awake, not in acute distress, sitting crosslegged on chair Cardiovascular: RRR, nl S1-S2, no murmurs appreciated.   No LE edema.   Respiratory: Normal  respiratory rate and rhythm.  CTAB without rales or wheezes. Abdominal: Abdomen soft and non-tender.  No distension or HSM.   Neuro/Psych: Strength symmetric in upper and lower extremities.  Judgment and insight appear normal.   The results of significant diagnostics from this hospitalization (including imaging, microbiology, ancillary and laboratory) are listed below for reference.     Microbiology: Recent Results (from the past 240 hour(s))  Urine Culture     Status: None   Collection Time: 08/11/18 10:15 AM  Result Value Ref Range Status   MICRO NUMBER: [id number]  Final   SPECIMEN QUALITY: Adequate  Final   Sample Source NOT GIVEN  Final   STATUS: FINAL  Final   ISOLATE 1:   Final    Single organism less than 10,000 CFU/mL isolated. These organisms, commonly found on external and internal genitalia, are considered colonizers. No further testing performed.     Labs: BNP (last 3 results) No results for input(s): BNP in the last 8760 hours. Basic Metabolic Panel: Recent Labs  Lab 08/11/18 1015 08/11/18 1334 08/12/18 0603 08/13/18 0419 08/14/18 0418  NA 129* 129* 132* 136 137  K 3.7 2.9* 3.2* 3.5 3.5  CL 92* 90* 98 103 105  CO2 27 26 22 24 24   GLUCOSE 113* 155* 138* 114* 98  BUN 7 8 5* <5* <5*  CREATININE 0.71 0.73 0.63 0.54 0.59  CALCIUM 9.5 9.0 8.3* 8.5* 8.8*  MG  --   --   --  2.2  --    Liver Function Tests: Recent Labs  Lab 08/11/18 1015 08/11/18 1334  AST 15 23  ALT 20 24  ALKPHOS 66 68  BILITOT 1.3* 1.2  PROT 7.1 7.3  ALBUMIN 4.5 4.5   Recent Labs  Lab 08/11/18 1334  LIPASE 26   No results for input(s): AMMONIA in the last 168 hours. CBC: Recent Labs  Lab 08/11/18 1015 08/11/18 1334 08/12/18 0603 08/13/18 0419 08/14/18 0418  WBC 16.7* 18.8* 12.6* 7.6 5.1  NEUTROABS 14.8*  --   --  5.8  --   HGB 13.1 13.0 11.3* 10.8* 11.2*  HCT 38.4 37.7 32.9* 32.3* 34.1*  MCV 91.3 92.4 90.9 94.7 94.2  PLT 202.0 211 181 200 219   Cardiac Enzymes: No  results for input(s): CKTOTAL, CKMB, CKMBINDEX, TROPONINI in the last 168 hours. BNP: Invalid input(s): POCBNP CBG: No results for input(s): GLUCAP in the last 168 hours. D-Dimer No results for input(s): DDIMER in the last 72 hours. Hgb A1c No results for input(s): HGBA1C in the last 72 hours. Lipid Profile No results for input(s): CHOL, HDL, LDLCALC, TRIG, CHOLHDL, LDLDIRECT in the last 72 hours. Thyroid function studies No results for input(s): TSH, T4TOTAL, T3FREE, THYROIDAB in  the last 72 hours.  Invalid input(s): FREET3 Anemia work up No results for input(s): VITAMINB12, FOLATE, FERRITIN, TIBC, IRON, RETICCTPCT in the last 72 hours. Urinalysis    Component Value Date/Time   COLORURINE STRAW (A) 08/11/2018 [place] 08/11/2018 1509   LABSPEC 1.026 08/11/2018 1509   PHURINE 7.0 08/11/2018 1509   GLUCOSEU NEGATIVE 08/11/2018 1509   GLUCOSEU NEGATIVE 08/11/2018 1015   HGBUR SMALL (A) 08/11/2018 1509   BILIRUBINUR NEGATIVE 08/11/2018 1509   BILIRUBINUR negative 01/10/2015 1456   KETONESUR 5 (A) 08/11/2018 1509   PROTEINUR NEGATIVE 08/11/2018 1509   UROBILINOGEN 0.2 08/11/2018 1015   NITRITE NEGATIVE 08/11/2018 1509   LEUKOCYTESUR NEGATIVE 08/11/2018 1509   Sepsis Labs Invalid input(s): PROCALCITONIN,  WBC,  LACTICIDVEN Microbiology Recent Results (from the past 240 hour(s))  Urine Culture     Status: None   Collection Time: 08/11/18 10:15 AM  Result Value Ref Range Status   MICRO NUMBER: [id number]  Final   SPECIMEN QUALITY: Adequate  Final   Sample Source NOT GIVEN  Final   STATUS: FINAL  Final   ISOLATE 1:   Final    Single organism less than 10,000 CFU/mL isolated. These organisms, commonly found on external and internal genitalia, are considered colonizers. No further testing performed.     Time coordinating discharge: 25 minutes       SIGNED:   Edwin Dada, MD  [facility] 08/14/2018, 4:10 PM

## 2018-08-15 ENCOUNTER — Telehealth: Payer: Self-pay | Admitting: *Deleted

## 2018-08-15 NOTE — Telephone Encounter (Signed)
Pt was on TCM report admitted 08/11/18 for lower abdominal pain, found on CT to have diverticulitis with two 2 cm abscesses. Was started on IV antibiotics after admission which quickly defervesce. Pt D/C 08/14/18,and will follow-up w/surgeon to repeat CT../lm,b

## 2018-08-25 DIAGNOSIS — K5720 Diverticulitis of large intestine with perforation and abscess without bleeding: Secondary | ICD-10-CM | POA: Diagnosis not present

## 2018-09-13 DIAGNOSIS — Z1231 Encounter for screening mammogram for malignant neoplasm of breast: Secondary | ICD-10-CM | POA: Diagnosis not present

## 2018-09-13 DIAGNOSIS — Z6822 Body mass index (BMI) 22.0-22.9, adult: Secondary | ICD-10-CM | POA: Diagnosis not present

## 2018-09-13 DIAGNOSIS — Z01419 Encounter for gynecological examination (general) (routine) without abnormal findings: Secondary | ICD-10-CM | POA: Diagnosis not present

## 2018-09-14 ENCOUNTER — Other Ambulatory Visit: Payer: Self-pay | Admitting: General Surgery

## 2018-09-14 ENCOUNTER — Encounter: Payer: Self-pay | Admitting: Internal Medicine

## 2018-09-16 ENCOUNTER — Other Ambulatory Visit: Payer: Self-pay | Admitting: General Surgery

## 2018-09-16 DIAGNOSIS — K5720 Diverticulitis of large intestine with perforation and abscess without bleeding: Secondary | ICD-10-CM

## 2018-09-19 ENCOUNTER — Ambulatory Visit: Payer: Medicare Other | Admitting: Internal Medicine

## 2018-09-23 ENCOUNTER — Ambulatory Visit
Admission: RE | Admit: 2018-09-23 | Discharge: 2018-09-23 | Disposition: A | Discharge status: Home / Self Care | Payer: Medicare Other | Source: Ambulatory Visit | Attending: General Surgery | Admitting: General Surgery

## 2018-09-23 DIAGNOSIS — K5720 Diverticulitis of large intestine with perforation and abscess without bleeding: Secondary | ICD-10-CM

## 2018-09-23 MED ORDER — IOPAMIDOL (ISOVUE-300) INJECTION 61%
100.0000 mL | Freq: Once | INTRAVENOUS | Status: AC | PRN
  Administered 2018-09-23: 100 mL via INTRAVENOUS

## 2018-10-07 ENCOUNTER — Other Ambulatory Visit: Payer: Self-pay | Admitting: Internal Medicine

## 2018-10-07 MED ORDER — METAXALONE 800 MG PO TABS: 800.0000 mg | ORAL_TABLET | Freq: Three times a day (TID) | ORAL | 0 refills | Status: DC | PRN

## 2018-10-12 ENCOUNTER — Encounter: Payer: Self-pay | Admitting: Internal Medicine

## 2018-10-14 ENCOUNTER — Encounter: Payer: Self-pay | Admitting: Internal Medicine

## 2018-10-14 DIAGNOSIS — M542 Cervicalgia: Secondary | ICD-10-CM | POA: Insufficient documentation

## 2018-10-15 ENCOUNTER — Other Ambulatory Visit: Payer: Self-pay | Admitting: Internal Medicine

## 2018-11-10 ENCOUNTER — Other Ambulatory Visit: Payer: Self-pay | Admitting: Internal Medicine

## 2018-12-04 NOTE — Progress Notes (Signed)
Virtual Visit via Video Note  I connected with [name] on 12/04/18 at  9:45 AM EDT by a video enabled telemedicine application and verified that I am speaking with the correct person using two identifiers.   I discussed the limitations of evaluation and management by telemedicine and the availability of in person appointments. The patient expressed understanding and agreed to proceed.  The patient is currently at home and I am in the office.    No referring provider.    History of Present Illness: She is here for follow up of her chronic medical conditions.   Hypertension: She is taking her medication daily. She is compliant with a low sodium diet.  She denies chest pain, palpitations, edema, shortness of breath and regular headaches. She does monitor her blood pressure at home on occasion but has not checked it recently.    Hyperlipidemia: She is taking her medication daily. She is compliant with a low fat/cholesterol diet.   Anxiety, insomnia: She is xanax prn only. She last took it 4-6 weeks ago.  She denies any side effects from the medication. The xanax works well for her anxiety and insomnia.    Chronic left shoulder pain, chronic back pain;  She takes diclofenac prn.  She takes skelaxin prn as well, which her insurance no longer wants to pay for.     Recurrent UTI's:  She takes Macrobid daily for prevention.  She denies any symptoms of  UTI in the past few months.  Her insurance is stating it will not cover the [place].      ROS:  Negative or fever/chills, cough, wheeze, SOB, CP, palpitations, edema and lightheadedness.  She has occasional headaches.     Observations/Objective: She appears well in NAD She has not checked her BP at home recently.   Assessment and Plan:  See Problem List for Assessment and Plan of chronic medical problems.   Follow Up Instructions:    I discussed the assessment and treatment plan with the patient. The patient was provided an  opportunity to ask questions and all were answered. The patient agreed with the plan and demonstrated an understanding of the instructions.   The patient was advised to call back or seek an in-person evaluation if the symptoms worsen or if the condition fails to improve as anticipated.  I provided 5 minutes of non-face-to-face time and 20 minutes of face-to-face time during this encounter.   [name], MD

## 2018-12-05 ENCOUNTER — Encounter: Payer: Self-pay | Admitting: Internal Medicine

## 2018-12-05 ENCOUNTER — Ambulatory Visit (INDEPENDENT_AMBULATORY_CARE_PROVIDER_SITE_OTHER): Payer: Medicare Other | Admitting: Internal Medicine

## 2018-12-05 DIAGNOSIS — F411 Generalized anxiety disorder: Secondary | ICD-10-CM | POA: Diagnosis not present

## 2018-12-05 DIAGNOSIS — M542 Cervicalgia: Secondary | ICD-10-CM

## 2018-12-05 DIAGNOSIS — N390 Urinary tract infection, site not specified: Secondary | ICD-10-CM

## 2018-12-05 DIAGNOSIS — I10 Essential (primary) hypertension: Secondary | ICD-10-CM | POA: Diagnosis not present

## 2018-12-05 DIAGNOSIS — M25512 Pain in left shoulder: Secondary | ICD-10-CM

## 2018-12-05 DIAGNOSIS — G8929 Other chronic pain: Secondary | ICD-10-CM

## 2018-12-05 DIAGNOSIS — E785 Hyperlipidemia, unspecified: Secondary | ICD-10-CM | POA: Diagnosis not present

## 2018-12-05 MED ORDER — NITROFURANTOIN MONOHYD MACRO 100 MG PO CAPS: 100.0000 mg | ORAL_CAPSULE | Freq: Every day | ORAL | 1 refills | Status: DC

## 2018-12-05 MED ORDER — TIZANIDINE HCL 2 MG PO TABS: 2.0000 mg | ORAL_TABLET | Freq: Four times a day (QID) | ORAL | 0 refills | Status: DC | PRN

## 2018-12-05 NOTE — Assessment & Plan Note (Signed)
Has been taking Macrobid once daily for prevention for a very long time and has done well on it She has no side effects and has prevented most UTIs-her last one was 08/27/2015 Her insurance company states this is no longer on formulary-I will send a prescription for this to her pharmacy and we will try for prior authorization since she has been stable on this medication If it is not covered may need to consider an alternative, such as Keflex

## 2018-12-05 NOTE — Assessment & Plan Note (Signed)
Continue pravastatin at current dose Will recheck lipid panel, CMP at her next visit in 6 months

## 2018-12-05 NOTE — Assessment & Plan Note (Signed)
Takes diclofenac as needed only Also takes Skelaxin as needed for muscle spasms-insurance states or no longer cover this-we will try tizanidine She will use extremity as needed only 

## 2018-12-05 NOTE — Assessment & Plan Note (Signed)
Takes diclofenac as needed only Also takes Skelaxin as needed for muscle spasms-insurance states or no longer cover this-we will try tizanidine She will use extremity as needed only

## 2018-12-05 NOTE — Assessment & Plan Note (Signed)
BP Readings from Last 3 Encounters:  08/14/18 139/78  08/11/18 130/72  06/06/18 (!) 146/82   Blood pressure typically controlled when she is here She has not been monitoring it at home-advised her to check it on occasion to make sure it is still controlled Continue current medication at current doses We will defer blood work for 6 months

## 2018-12-05 NOTE — Assessment & Plan Note (Signed)
Rarely takes Xanax for insomnia or anxiety Last took the medication 4-6 weeks ago No side effects, effective Continue only as needed

## 2018-12-11 ENCOUNTER — Encounter: Payer: Self-pay | Admitting: Internal Medicine

## 2018-12-12 ENCOUNTER — Encounter: Payer: Self-pay | Admitting: Internal Medicine

## 2018-12-12 ENCOUNTER — Ambulatory Visit (INDEPENDENT_AMBULATORY_CARE_PROVIDER_SITE_OTHER): Payer: Medicare Other | Admitting: Internal Medicine

## 2018-12-12 DIAGNOSIS — R300 Dysuria: Secondary | ICD-10-CM | POA: Insufficient documentation

## 2018-12-12 MED ORDER — AMOXICILLIN-POT CLAVULANATE 875-125 MG PO TABS: 1.0000 | ORAL_TABLET | Freq: Two times a day (BID) | ORAL | 0 refills | Status: DC

## 2018-12-12 NOTE — Progress Notes (Signed)
Virtual Visit via Video Note  I connected with [name] on 12/12/18 at  1:30 PM EDT by a video enabled telemedicine application and verified that I am speaking with the correct person using two identifiers.   I discussed the limitations of evaluation and management by telemedicine and the availability of in person appointments. The patient expressed understanding and agreed to proceed.  The patient is currently at home and I am in the office.    No referring provider.    History of Present Illness: This is an acute visit for a possible UTI.  She is taking nitrofurantoin daily for prevention of recurrent UTIs.  A couple of weeks ago she had some subtle symptoms of a possible UTI, but they did not persist.  Over the past few days they have returned and been more consistent.  She states some dysuria, sometimes increased urinary frequency, her urine is cloudy and mild tenderness in the pelvic/bladder region.  She denies any blood in the urine, fevers, back pain or nausea.  Her last UTI was approximately 4 years ago.     Observations/Objective: Appears well in NAD   Assessment and Plan:  See Problem List for Assessment and Plan of chronic medical problems.   Follow Up Instructions:    I discussed the assessment and treatment plan with the patient. The patient was provided an opportunity to ask questions and all were answered. The patient agreed with the plan and demonstrated an understanding of the instructions.   The patient was advised to call back or seek an in-person evaluation if the symptoms worsen or if the condition fails to improve as anticipated.     [name], MD

## 2018-12-12 NOTE — Assessment & Plan Note (Signed)
Symptoms consistent with probable UTI We will treat empirically with Augmentin twice daily x7 days She will hold the nitrofurantoin while taking this and restart it when she completes it If her symptoms do not improve I would like her to come in for urinalysis/culture, but does not need appointment Continue increase fluids Tylenol as needed

## 2018-12-15 ENCOUNTER — Encounter: Payer: Self-pay | Admitting: Internal Medicine

## 2018-12-22 MED ORDER — TIZANIDINE HCL 2 MG PO TABS: 2.0000 mg | ORAL_TABLET | Freq: Four times a day (QID) | ORAL | 0 refills | Status: DC | PRN

## 2018-12-27 ENCOUNTER — Encounter: Payer: Self-pay | Admitting: Internal Medicine

## 2018-12-28 ENCOUNTER — Encounter: Payer: Self-pay | Admitting: Internal Medicine

## 2019-01-01 ENCOUNTER — Other Ambulatory Visit: Payer: Self-pay | Admitting: Internal Medicine

## 2019-01-02 ENCOUNTER — Encounter: Payer: Self-pay | Admitting: Internal Medicine

## 2019-01-02 MED ORDER — ALPRAZOLAM 0.25 MG PO TABS: 0.2500 mg | ORAL_TABLET | Freq: Two times a day (BID) | ORAL | 0 refills | Status: DC | PRN

## 2019-01-02 NOTE — Telephone Encounter (Signed)
Check [place] registry last filled 05/12/2018.Marland KitchenJohny [name]

## 2019-01-03 MED ORDER — PREDNISONE 20 MG PO TABS: 40.0000 mg | ORAL_TABLET | Freq: Every day | ORAL | 0 refills | Status: DC

## 2019-01-05 ENCOUNTER — Encounter: Payer: Self-pay | Admitting: Internal Medicine

## 2019-01-06 ENCOUNTER — Encounter: Payer: Self-pay | Admitting: Internal Medicine

## 2019-01-06 ENCOUNTER — Other Ambulatory Visit: Payer: Self-pay | Admitting: Internal Medicine

## 2019-01-06 NOTE — Telephone Encounter (Signed)
Copied from [place] [id number]. Topic: Quick Communication - Rx Refill/Question >> Jan 06, 2019  1:51 PM Scherrie Gerlach wrote: Medication: tiZANidine (ZANAFLEX) 2 MG tablet  60 tabs  Pt states the pharmacy could not fax and advised her to call.  [id number] - [place], [place] [phone] (Phone) [phone] (Fax)

## 2019-01-09 ENCOUNTER — Encounter: Payer: Self-pay | Admitting: Internal Medicine

## 2019-01-10 ENCOUNTER — Encounter: Payer: Self-pay | Admitting: Internal Medicine

## 2019-01-10 ENCOUNTER — Other Ambulatory Visit: Payer: Self-pay | Admitting: Internal Medicine

## 2019-01-10 DIAGNOSIS — M549 Dorsalgia, unspecified: Secondary | ICD-10-CM

## 2019-01-18 NOTE — Progress Notes (Signed)
[facility] [place], [place] [address] Phone: [phone] Subjective:   I Kandace Blitz am serving as a Education administrator for Dr. Hulan Saas.  I'm seeing this patient by the request  of:  [name], MD   CC: Neck pain  [id number]  [name] is a 68 y.o. female coming in with complaint of neck pain. Pain started in her neck. Right sided back pain. Pain radiates to the left as well. States that it feels like muscle spasms from the occiput to the shoulders. Painful after light activity (walking). Pain wakes her up at night.   Onset- 12/12/2018 Location- neck, back-  Character- dull, achy, sharp Aggravating factors- side bending, twisting  Therapies tried- muscle relaxer, ice, heat, topical (CBD, magnesium)  Severity- 5/10    Patient had neck pain in 2010.  Independently lifting x-rays that showed moderate to severe degenerative disc disease and facet arthropathy of multiple joints in the neck.  Past Medical History:  Diagnosis Date  . ALLERGIC RHINITIS   . ANEMIA, PERNICIOUS   . ANXIETY   . BILIARY DYSKINESIA   . CARPAL TUNNEL SYNDROME, LEFT   . DYSLIPIDEMIA   . Headache(784.0)   . HYPERTENSION   . Irritable bowel syndrome   . Non-celiac gluten sensitivity   . OSTEOPENIA   . 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[place])    borderline   Past Surgical History:  Procedure Laterality Date  . APPENDECTOMY    . CARPAL TUNNEL RELEASE    . CESAREAN SECTION  74 & 83   x's 2   . CHOLECYSTECTOMY  2005   Dr. Dalbert Batman (Gallbladder dyskinesia)  . COLONOSCOPY    . DILATION AND CURETTAGE OF UTERUS    . LEFT HEART CATHETERIZATION WITH CORONARY ANGIOGRAM N/A 05/19/2012   Procedure: LEFT HEART CATHETERIZATION WITH CORONARY ANGIOGRAM;  Surgeon: Hillary Bow, MD;  Location: [facility] CATH LAB;  Service: Cardiovascular;  Laterality: N/A;  . ROTATOR CUFF REPAIR    . TONSILLECTOMY    . UPPER GASTROINTESTINAL ENDOSCOPY     Social History   Socioeconomic  History  . Marital status: Married    Spouse name: Not on file  . Number of children: Not on file  . Years of education: Not on file  . Highest education level: Not on file  Occupational History  . Occupation: Building surveyor: [employer]  Social Needs  . Financial resource strain: Not on file  . Food insecurity:    Worry: Not on file    Inability: Not on file  . Transportation needs:    Medical: Not on file    Non-medical: Not on file  Tobacco Use  . Smoking status: Never Smoker  . Smokeless tobacco: Never Used  Substance and Sexual Activity  . Alcohol use: Yes    Alcohol/week: 1.0 standard drinks    Types: 1 Glasses of wine per week    Comment: occassional  . Drug use: No  . Sexual activity: Yes    Birth control/protection: Post-menopausal  Lifestyle  . Physical activity:    Days per week: Not on file    Minutes per session: Not on file  . Stress: Not on file  Relationships  . Social connections:    Talks on phone: Not on file    Gets together: Not on file    Attends religious service: Not on file    Active member of club or organization: Not on file  Attends meetings of clubs or organizations: Not on file    Relationship status: Not on file  Other Topics Concern  . Not on file  Social History Narrative   Married and lives with husband.    Dental hygienist - retired   Allergies  Allergen Reactions  . Fosamax [Alendronate Sodium]     GI intolerance remotely  . Hydrocodone-Acetaminophen     REACTION: itching  . Oxycodone-Acetaminophen     REACTION: itching   Family History  Problem Relation Age of Onset  . Colitis Mother   . Heart disease Mother   . Hyperlipidemia Mother   . Lung cancer Mother        Cause of death  . Hypertension Mother   . Heart disease Brother   . Valvular heart disease Brother   . Heart attack Brother   . Atrial fibrillation Brother   . Heart failure Brother   . Miscarriages / [place] Brother   . Lung  cancer Father   . Leukemia Brother   . Coronary artery disease Brother        MI in late 61's. Still living  . Ovarian cancer Maternal Aunt   . Diabetes Maternal Aunt   . Coronary artery disease Brother        MI in 41's. Also still living.  . Colon cancer Neg Hx   . Colon polyps Neg Hx     Current Outpatient Medications (Endocrine & Metabolic):  .  predniSONE (DELTASONE) 20 MG tablet, Take 2 tablets (40 mg total) by mouth daily with breakfast.  Current Outpatient Medications (Cardiovascular):  .  amLODipine (NORVASC) 10 MG tablet, TAKE 1 TABLET BY MOUTH EVERY DAY .  hydrochlorothiazide (HYDRODIURIL) 12.5 MG tablet, TAKE 1 TABLET BY MOUTH EVERY DAY .  pravastatin (PRAVACHOL) 40 MG tablet, TAKE 1 TABLET BY MOUTH EVERY DAY  Current Outpatient Medications (Respiratory):  .  pseudoephedrine (SUDAFED) 30 MG tablet, Take 30 mg by mouth every 4 (four) hours as needed for congestion.  Current Outpatient Medications (Analgesics):  .  diclofenac (VOLTAREN) 75 MG EC tablet, Take 1 tablet (75 mg total) by mouth 2 (two) times daily as needed for mild pain.  Current Outpatient Medications (Hematological):  .  vitamin B-12 (CYANOCOBALAMIN) 1000 MCG tablet, Take 1 tablet (1,000 mcg total) by mouth daily.  Current Outpatient Medications (Other):  [name]  ALPRAZolam (XANAX) 0.25 MG tablet, Take 1 tablet (0.25 mg total) by mouth 2 (two) times daily as needed for anxiety. [name]  amoxicillin-clavulanate (AUGMENTIN) 875-125 MG tablet, Take 1 tablet by mouth 2 (two) times daily. .  calcium-vitamin D (OSCAL WITH D) 500-200 MG-UNIT tablet, Take 1 tablet by mouth daily with breakfast. .  cholecalciferol (VITAMIN D3) 25 MCG (1000 UT) tablet, Take 1,000 Units by mouth daily. .  Ibuprofen-diphenhydrAMINE HCl (IBUPROFEN PM) 200-25 MG CAPS, Take 1 tablet by mouth at bedtime as needed (pain/sleep). .  nitrofurantoin, macrocrystal-monohydrate, (MACROBID) 100 MG capsule, Take 1 capsule (100 mg total) by mouth at bedtime. .   gabapentin (NEURONTIN) 100 MG capsule, Take 2 capsules (200 mg total) by mouth at bedtime. [name]  tiZANidine (ZANAFLEX) 4 MG tablet, Take 1 tablet (4 mg total) by mouth Nightly for 10 days.    Past medical history, social, surgical and family history all reviewed in electronic medical record.  No pertanent information unless stated regarding to the chief complaint.   Review of Systems:  No headache, visual changes, nausea, vomiting, diarrhea, constipation, dizziness, abdominal pain, skin rash, fevers, chills, night  sweats, weight loss, swollen lymph nodes, body aches, joint swelling,, chest pain, shortness of breath, mood changes.  Positive muscle aches  Objective  Blood pressure (!) 160/78, pulse 91, height 5\' 2"  (1.575 m), weight 110 lb (49.9 kg), SpO2 98 %.    General: No apparent distress alert and oriented x3 mood and affect normal, dressed appropriately.  HEENT: Pupils equal, extraocular movements intact  Respiratory: Patient's speak in full sentences and does not appear short of breath  Cardiovascular: No lower extremity edema, non tender, no erythema  Skin: Warm dry intact with no signs of infection or rash on extremities or on axial skeleton.  Abdomen: Soft nontender  Neuro: Cranial nerves II through XII are intact, neurovascularly intact in all extremities with 2+ DTRs and 2+ pulses.  Lymph: No lymphadenopathy of posterior or anterior cervical chain or axillae bilaterally.  Gait normal with good balance and coordination.  MSK:  Non tender with full range of motion and good stability and symmetric strength and tone of shoulders, elbows, wrist, hip, knee and ankles bilaterally.  Neck: Inspection loss of lordosis. No palpable stepoffs. Negative Spurling's maneuver. Limited range of motion in all planes significant voluntary guarding noted Grip strength and sensation normal in bilateral hands Strength good C4 to T1 distribution No sensory change to C4 to T1 Negative Hoffman sign  bilaterally Reflexes normal Severe tightness of the trapezius bilaterally.  Patient seemed to be able to move the shoulder spine.  No chest pain or any increase in pain with deep inspiration  [id number]; 15 additional minutes spent for Therapeutic exercises as stated in above notes.  This included exercises focusing on stretching, strengthening, with significant focus on eccentric aspects.   Long term goals include an improvement in range of motion, strength, endurance as well as avoiding reinjury. Patient's frequency would include in 1-2 times a day, 3-5 times a week for a duration of 6-12 weeks. exercises that included:  Basic scapular stabilization to include adduction and depression of scapula [place], focusing on proper movement and good control Internal and External rotation utilizing a theraband, with elbow tucked at side entire time Rows with theraband   Proper technique shown and discussed handout in great detail with ATC.  All questions were discussed and answered.     Impression and Recommendations:     This case required medical decision making of moderate complexity. The above documentation has been reviewed and is accurate and complete Lyndal Pulley, DO       Note: This dictation was prepared with Dragon dictation along with smaller phrase technology. Any transcriptional errors that result from this process are unintentional.

## 2019-01-19 ENCOUNTER — Ambulatory Visit (INDEPENDENT_AMBULATORY_CARE_PROVIDER_SITE_OTHER)
Admission: RE | Admit: 2019-01-19 | Discharge: 2019-01-19 | Disposition: A | Discharge status: Home / Self Care | Payer: Medicare Other | Source: Ambulatory Visit | Attending: Family Medicine | Admitting: Family Medicine

## 2019-01-19 ENCOUNTER — Other Ambulatory Visit: Payer: Self-pay

## 2019-01-19 ENCOUNTER — Ambulatory Visit (INDEPENDENT_AMBULATORY_CARE_PROVIDER_SITE_OTHER): Payer: Medicare Other | Admitting: Family Medicine

## 2019-01-19 ENCOUNTER — Encounter: Payer: Self-pay | Admitting: Family Medicine

## 2019-01-19 VITALS — BP 160/78 | HR 91 | Ht 62.0 in | Wt 110.0 lb

## 2019-01-19 DIAGNOSIS — G8929 Other chronic pain: Secondary | ICD-10-CM

## 2019-01-19 DIAGNOSIS — M542 Cervicalgia: Secondary | ICD-10-CM

## 2019-01-19 MED ORDER — METHYLPREDNISOLONE ACETATE 80 MG/ML IJ SUSP
80.0000 mg | Freq: Once | INTRAMUSCULAR | Status: AC
  Administered 2019-01-19: 12:00:00 80 mg via INTRAMUSCULAR

## 2019-01-19 MED ORDER — TIZANIDINE HCL 4 MG PO TABS: 4.0000 mg | ORAL_TABLET | Freq: Every evening | ORAL | 2 refills | Status: AC

## 2019-01-19 MED ORDER — GABAPENTIN 100 MG PO CAPS: 200.0000 mg | ORAL_CAPSULE | Freq: Every day | ORAL | 3 refills | Status: DC

## 2019-01-19 MED ORDER — KETOROLAC TROMETHAMINE 60 MG/2ML IM SOLN
60.0000 mg | Freq: Once | INTRAMUSCULAR | Status: AC
  Administered 2019-01-19: 12:00:00 60 mg via INTRAMUSCULAR

## 2019-01-19 NOTE — Assessment & Plan Note (Signed)
Appears to be more musculoskeletal in nature.  Discussed with patient in great length.  Refilled muscle relaxer at a greater dose.  Gabapentin given at night for Neurologic aspect.  New x-rays ordered today to further evaluate the underlying arthritic changes that could be contributing.  We discussed which type of symptoms would seek medical attention sooner.  Injection of Toradol and Depo-Medrol given today.  Home exercises and work with her current trainer.  Follow-up again with me virtually in 2 weeks.  Worsening symptoms advanced imaging may be warranted at this moment no weakness or numbness of the hands.

## 2019-01-19 NOTE — Patient Instructions (Signed)
Good to see you  Ice 20 minutes 2 times daily. Usually after activity and before bed. Exercises 3 times a week.  Kepe hands within peripheral visoin  Xrays downstairs 2 injections today to decrease inflammation  Start tomorrow Ibuprofen 3 pills 3 times a dya for 3 days Gabapentin 200mg  at night and can start now.  Muscle relaxer as you need it and increased dose We can do virtual in 2 weeks

## 2019-01-28 ENCOUNTER — Other Ambulatory Visit: Payer: Self-pay | Admitting: Internal Medicine

## 2019-02-01 NOTE — Progress Notes (Signed)
Virtual Visit via Video Note  I connected with [name] on 02/01/19 at 12:45 PM EDT by a video enabled telemedicine application and verified that I am speaking with the correct person using two identifiers.  Location: Patient: Patient was in the home setting Provider: I was in office setting   I discussed the limitations of evaluation and management by telemedicine and the availability of in person appointments. The patient expressed understanding and agreed to proceed.  History of Present Illness: Patient is a very pleasant 68 year old female who was having more neck pain.  Patient's x-rays were independently visualized by me showing severe advanced degenerative disc disease.  Patient has been started on gabapentin, anti-inflammatories and muscle relaxers.  States that she is feeling 80 to 90% better.  No radicular symptoms.  Doing the exercises occasionally.  Did not start the vitamin D    Observations/Objective: Patient appears in much better mood.  Patient appears to being upright and walking around her kitchen  Assessment and Plan: Cervical degenerative disc disease doing well at the moment.  We discussed how to titrate down on some of the medications.  Start traction again, encouraged home exercise, follow-up again 6 weeks if not completely resolved      I discussed the assessment and treatment plan with the patient. The patient was provided an opportunity to ask questions and all were answered. The patient agreed with the plan and demonstrated an understanding of the instructions.   The patient was advised to call back or seek an in-person evaluation if the symptoms worsen or if the condition fails to improve as anticipated.  I provided 26 minutes of non-face-to-face time during this encounter.   Lyndal Pulley, DO

## 2019-02-02 ENCOUNTER — Encounter: Payer: Self-pay | Admitting: Family Medicine

## 2019-02-02 ENCOUNTER — Ambulatory Visit (INDEPENDENT_AMBULATORY_CARE_PROVIDER_SITE_OTHER): Payer: Medicare Other | Admitting: Family Medicine

## 2019-02-02 DIAGNOSIS — M5030 Other cervical disc degeneration, unspecified cervical region: Secondary | ICD-10-CM | POA: Insufficient documentation

## 2019-02-07 ENCOUNTER — Other Ambulatory Visit: Payer: Self-pay | Admitting: Internal Medicine

## 2019-02-10 ENCOUNTER — Encounter: Payer: Self-pay | Admitting: Family Medicine

## 2019-02-10 ENCOUNTER — Encounter: Payer: Self-pay | Admitting: Internal Medicine

## 2019-04-08 ENCOUNTER — Other Ambulatory Visit: Payer: Self-pay | Admitting: Internal Medicine

## 2019-04-12 ENCOUNTER — Other Ambulatory Visit: Payer: Self-pay | Admitting: Internal Medicine

## 2019-04-12 ENCOUNTER — Encounter: Payer: Self-pay | Admitting: Internal Medicine

## 2019-04-21 ENCOUNTER — Other Ambulatory Visit: Payer: Self-pay | Admitting: Internal Medicine

## 2019-05-25 ENCOUNTER — Encounter: Payer: Self-pay | Admitting: Internal Medicine

## 2019-06-07 DIAGNOSIS — R739 Hyperglycemia, unspecified: Secondary | ICD-10-CM | POA: Insufficient documentation

## 2019-06-07 NOTE — Patient Instructions (Addendum)
  Tests ordered today. Your results will be released to [place] (or called to you) after review.  If any changes need to be made, you will be notified at that same time.  All other Health Maintenance issues reviewed.   All recommended immunizations and age-appropriate screenings are up-to-date or discussed.  Pneumonia and flu immunizations administered today.    Medications reviewed and updated.  Changes include :   none     Please followup in 6 months   [facility]  [address]  [place]  [place], [place] [address]  Main: [phone]

## 2019-06-07 NOTE — Progress Notes (Addendum)
Subjective:   [name] is a 68 y.o. female who presents for Medicare Annual (Subsequent) preventive examination.  Review of Systems:   Cardiac Risk Factors include: advanced age (>22men, >39 women);hypertension Sleep patterns: feels rested on waking, gets up 1 times nightly to void and sleeps 6-8 hours nightly.    Home Safety/Smoke Alarms: Feels safe in home. Smoke alarms in place.  Living environment; residence and Firearm Safety: 2-story house. Lives with husband, no needs for DME, good support system Seat Belt Safety/Bike Helmet: Wears seat belt.     Objective:     Vitals: BP 134/72   Pulse 68   Temp 98.1 F (36.7 C)   Resp 17   Ht 5' (1.524 m)   Wt 109 lb (49.4 kg)   SpO2 99%   BMI 21.29 kg/m   Body mass index is 21.29 kg/m.  Advanced Directives 06/08/2019 08/11/2018 05/09/2015 05/19/2012 05/18/2012  Does Patient Have a Medical Advance Directive? Yes No No Patient does not have advance directive;Patient would not like information Patient does not have advance directive;Patient would not like information  Type of Scientist, forensic Power of [place];Living will - - - -  Copy of [place] in Chart? No - copy requested - - - -  Would patient like information on creating a medical advance directive? - No - Patient declined No - patient declined information - -  Pre-existing out of facility DNR order (yellow form or pink MOST form) - - - - No    Tobacco Social History   Tobacco Use  Smoking Status Never Smoker  Smokeless Tobacco Never Used     Counseling given: Not Answered  Past Medical History:  Diagnosis Date  . ALLERGIC RHINITIS   . ANEMIA, PERNICIOUS   . ANXIETY   . BILIARY DYSKINESIA   . CARPAL TUNNEL SYNDROME, LEFT   . DYSLIPIDEMIA   . Headache(784.0)   . HYPERTENSION   . Irritable bowel syndrome   . Non-celiac gluten sensitivity   . OSTEOPENIA   . 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 APPENDECTOMY    . CARPAL TUNNEL RELEASE    . CESAREAN SECTION  74 & 83   x's 2   . CHOLECYSTECTOMY  2005   Dr. Dalbert Batman (Gallbladder dyskinesia)  . COLONOSCOPY    . DILATION AND CURETTAGE OF UTERUS    . LEFT HEART CATHETERIZATION WITH CORONARY ANGIOGRAM N/A 05/19/2012   Procedure: LEFT HEART CATHETERIZATION WITH CORONARY ANGIOGRAM;  Surgeon: Hillary Bow, MD;  [facility] C[facility] LAB;  Service: Cardiovascular;  Laterality: N/A;  . ROTATOR CUFF REPAIR    . TONSILLECTOMY    . UPPER GASTROINTESTINAL ENDOSCOPY     Family History  Problem Relation Age of Onset  . Colitis Mother   . Heart disease Mother   . Hyperlipidemia Mother   . Lung cancer Mother        Cause of death  . Hypertension Mother   . Heart disease Brother   . Valvular heart disease Brother   . Heart attack Brother   . Atrial fibrillation Brother   . Heart failure Brother   . Miscarriages / [place] Brother   . Lung cancer Father   . Leukemia Brother   . Coronary artery disease Brother        MI in late 23's. Still living  . Ovarian cancer Maternal Aunt   . Diabetes Maternal  Aunt   . Coronary artery disease Brother        MI in 70's. Also still living.  . Colon cancer Neg Hx   . Colon polyps Neg Hx    Social History   Socioeconomic History  . Marital status: Married    Spouse name: Not on file  . Number of children: Not on file  . Years of education: Not on file  . Highest education level: Not on file  Occupational History  . 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 Financial resource strain: Not hard at all  . Food insecurity    Worry: Never true    Inability: Never true  . Transportation needs    Medical: No    Non-medical: No  Tobacco Use  . Smoking status: Never Smoker  . Smokeless tobacco: Never Used  Substance and Sexual Activity  . Alcohol use: Yes    Alcohol/week: 1.0 standard drinks    Types: 1 Glasses  of wine per week    Comment: occassional  . Drug use: No  . Sexual activity: Yes    Birth control/protection: Post-menopausal  Lifestyle  . Physical activity    Days per week: 5 days    Minutes per session: 50 min  . Stress: Not at all  Relationships  . Social connections    Talks on phone: More than three times a week    Gets together: More than three times a week    Attends religious service: 1 to 4 times per year    Active member of club or organization: Yes    Attends meetings of clubs or organizations: More than 4 times per year    Relationship status: Married  Other Topics Concern  . Not on file  Social History Narrative   Married and lives with husband.    Dental hygienist - retired    Outpatient Encounter Medications as of 06/08/2019  Medication Sig  . ALPRAZolam (XANAX) 0.25 MG tablet Take 1 tablet (0.25 mg total) by mouth 2 (two) times daily as needed for anxiety.  [name] amLODipine (NORVASC) 10 MG tablet TAKE 1 TABLET BY MOUTH EVERY DAY  . calcium-vitamin D (OSCAL WITH D) 500-200 MG-UNIT tablet Take 1 tablet by mouth daily with breakfast.  . cholecalciferol (VITAMIN D3) 25 MCG (1000 UT) tablet Take 1,000 Units by mouth daily.  . diclofenac (VOLTAREN) 75 MG EC tablet Take 1 tablet (75 mg total) by mouth 2 (two) times daily as needed for mild pain.  . hydrochlorothiazide (HYDRODIURIL) 12.5 MG tablet TAKE 1 TABLET BY MOUTH EVERY DAY  . Ibuprofen-diphenhydrAMINE HCl (IBUPROFEN PM) 200-25 MG CAPS Take 1 tablet by mouth at bedtime as needed (pain/sleep).  . nitrofurantoin, macrocrystal-monohydrate, (MACROBID) 100 MG capsule TAKE 1 CAPSULE BY MOUTH TWICE DAILY (Patient taking differently: daily. )  . pravastatin (PRAVACHOL) 40 MG tablet TAKE 1 TABLET BY MOUTH EVERY DAY  . pseudoephedrine (SUDAFED) 30 MG tablet Take 30 mg by mouth every 4 (four) hours as needed for congestion.  [name] tiZANidine (ZANAFLEX) 4 MG tablet as needed.   . vitamin B-12 (CYANOCOBALAMIN) 1000 MCG tablet Take 1  tablet (1,000 mcg total) by mouth daily.  . [DISCONTINUED] amoxicillin-clavulanate (AUGMENTIN) 875-125 MG tablet Take 1 tablet by mouth 2 (two) times daily.  . [DISCONTINUED] gabapentin (NEURONTIN) 100 MG capsule Take 2 capsules (200 mg total) by mouth at bedtime. (Patient not taking: Reported on 06/08/2019)  . [DISCONTINUED] predniSONE (DELTASONE) 20 MG  tablet Take 2 tablets (40 mg total) by mouth daily with breakfast.   No facility-administered encounter medications on file as of 06/08/2019.     Activities of Daily Living In your present state of health, do you have any difficulty performing the following activities: 06/08/2019 08/11/2018  Hearing? N N  Vision? N N  Difficulty concentrating or making decisions? N N  Walking or climbing stairs? N N  Dressing or bathing? N N  Doing errands, shopping? N N  Preparing Food and eating ? N -  Using the Toilet? N -  In the past six months, have you accidently leaked urine? N -  Do you have problems with loss of bowel control? N -  Managing your Medications? N -  Managing your Finances? N -  Housekeeping or managing your Housekeeping? N -  Some recent data might be hidden    Patient Care Team: [name], MD as PCP - General (Internal Medicine) Thurman Coyer, DO as Consulting Physician (Sports Medicine) Maisie Fus, MD (Obstetrics and Gynecology) Kristeen Miss, MD (Neurosurgery) Hillary Bow, MD (Cardiology) Gatha Mayer, MD (Gastroenterology)    Assessment:   This is a routine wellness examination for [place].Physical assessment deferred to PCP.  Exercise Activities and Dietary recommendations Current Exercise Habits: Home exercise routine, Type of exercise: walking, Time (Minutes): 45, Frequency (Times/Week): 5, Weekly Exercise (Minutes/Week): 225, Intensity: Mild, Exercise limited by: None identified  Diet (meal preparation, eat out, water intake, caffeinated beverages, dairy products, fruits and vegetables): in  general, a "healthy" diet  , well balanced eats a variety of fruits and vegetables daily, limits salt, fat/cholesterol, sugar,carbohydrates,caffeine, drinks 6-8 glasses of water daily.  Reviewed heart healthy diet  Goals    . Patient Stated     Maintain current health status.       Fall Risk Fall Risk  06/08/2019 08/11/2017  Falls in the past year? 0 No  Number falls in past yr: 0 -  Injury with Fall? 1 -   Is the patient's home free of loose throw rugs in walkways, pet beds, electrical cords, etc?         Grab bars in the bathroom?       Handrails on the stairs?         Adequate lighting?      Depression Screen PHQ 2/9 Scores 06/08/2019 08/11/2017  PHQ - 2 Score 1 0  PHQ- 9 Score 2 -     Cognitive Function       Ad8 score reviewed for issues:  Issues making decisions: no  Less interest in hobbies / activities: no  Repeats questions, stories (family complaining): no  Trouble using ordinary gadgets (microwave, computer, phone):no  Forgets the month or year: no  Mismanaging finances: no  Remembering appts: no  Daily problems with thinking and/or memory: no Ad8 score is= 0  Immunization History  Administered Date(s) Administered  . Fluad Quad(high Dose 65+) 06/08/2019  . Influenza Split 06/17/2012  . Influenza, High Dose Seasonal PF 06/06/2018  . Influenza,inj,Quad PF,6+ Mos 07/10/2013, 08/16/2014, 08/07/2016, 08/11/2017  . Pneumococcal Conjugate-13 06/08/2019  . Td 09/08/2003  . Tdap 11/08/2015  . Zoster Recombinat (Shingrix) 06/18/2017, 10/06/2017   Screening Tests Health Maintenance  Topic Date Due  . MAMMOGRAM  02/16/2019  . DEXA SCAN  02/16/2019  . PNA vac Low Risk Adult (2 of 2 - PPSV23) 06/07/2020  . COLONOSCOPY  05/22/2025  . TETANUS/TDAP  11/07/2025  . INFLUENZA VACCINE  Completed  .  Hepatitis C Screening  Completed       Plan:     Reviewed health maintenance screenings with patient today and relevant education, vaccines, and/or  referrals were provided.   I have personally reviewed and noted the following in the patient's chart:   . Medical and social history . Use of alcohol, tobacco or illicit drugs  . Current medications and supplements . Functional ability and status . Nutritional status . Physical activity . Advanced directives . List of other physicians . Vitals . Screenings to include cognitive, depression, and falls . Referrals and appointments  In addition, I have reviewed and discussed with patient certain preventive protocols, quality metrics, and best practice recommendations. A written personalized care plan for preventive services as well as general preventive health recommendations were provided to patient.     Michiel Cowboy, RN  06/08/2019    Medical screening examination/treatment/procedure(s) were performed by non-physician practitioner and as supervising physician I was immediately available for consultation/collaboration. I agree with above. [name], MD

## 2019-06-07 NOTE — Assessment & Plan Note (Addendum)
dexa due - done at [facility] - Dr Nori Riis - ordered for here by health coach nurse, but I recommend getting it with dr neal for better comparison to two years ago. Taking calcium and vitamin d daily exercising

## 2019-06-07 NOTE — Progress Notes (Signed)
Subjective:    Patient ID: [name], female    DOB: [birth year], 68 y.o.   MRN: [id number]  HPI The patient is here for follow up.  She is exercising regularly.    Hypertension: She is taking her medication daily. She is compliant with a low sodium diet.  She denies chest pain, palpitations, edema, shortness of breath and regular headaches. She does not monitor her blood pressure at home.    Osteoporosis:  She takes calcium and vitamin d daily.  She had her last dexa at her gyn's office - this was ordered by our healthcoach nurse and will be done here.  She is exercising.   Anxiety: She is taking her xanax as needed only.  She denies any side effects from the medication. She feels her anxiety is well controlled and she is happy with her current dose of medication.   Hyperlipidemia: She is taking her medication daily. She is compliant with a low fat/cholesterol diet. She denies myalgias.   Recurrent UTI:  She takes Macrobid daily for prophylaxis.      Medications and allergies reviewed with patient and updated if appropriate.  Patient Active Problem List   Diagnosis Date Noted  . Hyperglycemia 06/07/2019  . DDD (degenerative disc disease), cervical 02/02/2019  . Musculoskeletal neck pain 10/14/2018  . Change in bowel function 08/11/2018  . Lower abdominal pain 08/11/2018  . Diverticulitis 08/11/2018  . Shoulder pain, left 02/23/2017  . B12 deficiency 08/07/2016  . Recurrent UTI 11/08/2015  . Non-celiac gluten sensitivity   . Anxiety state 01/17/2010  . ALLERGIC RHINITIS 01/17/2010  . Dyslipidemia 07/12/2009  . Essential hypertension 07/12/2009  . Osteoporosis 06/22/2008  . 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 ANEMIA, PERNICIOUS 10/14/2007  . IRRITABLE BOWEL SYNDROME 10/14/2007    Current Outpatient Medications on File Prior to Visit  Medication Sig Dispense Refill  . ALPRAZolam (XANAX) 0.25 MG tablet Take 1 tablet (0.25 mg total) by mouth 2 (two)  times daily as needed for anxiety. 60 tablet 0  . amLODipine (NORVASC) 10 MG tablet TAKE 1 TABLET BY MOUTH EVERY DAY 90 tablet 1  . calcium-vitamin D (OSCAL WITH D) 500-200 MG-UNIT tablet Take 1 tablet by mouth daily with breakfast.    . cholecalciferol (VITAMIN D3) 25 MCG (1000 UT) tablet Take 1,000 Units by mouth daily.    . diclofenac (VOLTAREN) 75 MG EC tablet Take 1 tablet (75 mg total) by mouth 2 (two) times daily as needed for mild pain.    . hydrochlorothiazide (HYDRODIURIL) 12.5 MG tablet TAKE 1 TABLET BY MOUTH EVERY DAY 90 tablet 1  . Ibuprofen-diphenhydrAMINE HCl (IBUPROFEN PM) 200-25 MG CAPS Take 1 tablet by mouth at bedtime as needed (pain/sleep).    . nitrofurantoin, macrocrystal-monohydrate, (MACROBID) 100 MG capsule TAKE 1 CAPSULE BY MOUTH TWICE DAILY (Patient taking differently: daily. ) 180 capsule 1  . pravastatin (PRAVACHOL) 40 MG tablet TAKE 1 TABLET BY MOUTH EVERY DAY 90 tablet 0  . pseudoephedrine (SUDAFED) 30 MG tablet Take 30 mg by mouth every 4 (four) hours as needed for congestion.    . vitamin B-12 (CYANOCOBALAMIN) 1000 MCG tablet Take 1 tablet (1,000 mcg total) by mouth daily.     No current facility-administered medications on file prior to visit.     Past Medical History:  Diagnosis Date  . ALLERGIC RHINITIS   . ANEMIA, PERNICIOUS   . ANXIETY   . BILIARY DYSKINESIA   . CARPAL TUNNEL SYNDROME, LEFT   .  DYSLIPIDEMIA   . Headache(784.0)   . HYPERTENSION   . Irritable bowel syndrome   . Non-celiac gluten sensitivity   . OSTEOPENIA   . 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 APPENDECTOMY    . CARPAL TUNNEL RELEASE    . CESAREAN SECTION  76 & 85   x's 2   . CHOLECYSTECTOMY  2005   Dr. Dalbert Batman (Gallbladder dyskinesia)  . COLONOSCOPY    . DILATION AND CURETTAGE OF UTERUS    . LEFT HEART CATHETERIZATION WITH CORONARY ANGIOGRAM N/A 05/19/2012   Procedure: LEFT HEART CATHETERIZATION WITH  CORONARY ANGIOGRAM;  Surgeon: Hillary Bow, MD;  Location: [facility] CATH LAB;  Service: Cardiovascular;  Laterality: N/A;  . ROTATOR CUFF REPAIR    . TONSILLECTOMY    . UPPER GASTROINTESTINAL ENDOSCOPY      Social History   Socioeconomic History  . Marital status: Married    Spouse name: Not on file  . Number of children: Not on file  . Years of education: Not on file  . Highest education level: Not on file  Occupational History  . 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 Financial resource strain: Not hard at all  . Food insecurity    Worry: Never true    Inability: Never true  . Transportation needs    Medical: No    Non-medical: No  Tobacco Use  . Smoking status: Never Smoker  . Smokeless tobacco: Never Used  Substance and Sexual Activity  . Alcohol use: Yes    Alcohol/week: 1.0 standard drinks    Types: 1 Glasses of wine per week    Comment: occassional  . Drug use: No  . Sexual activity: Yes    Birth control/protection: Post-menopausal  Lifestyle  . Physical activity    Days per week: 5 days    Minutes per session: 50 min  . Stress: Not at all  Relationships  . Social connections    Talks on phone: More than three times a week    Gets together: More than three times a week    Attends religious service: 1 to 4 times per year    Active member of club or organization: Yes    Attends meetings of clubs or organizations: More than 4 times per year    Relationship status: Married  Other Topics Concern  . Not on file  Social History Narrative   Married and lives with husband.    Dental hygienist - retired    Family History  Problem Relation Age of Onset  . Colitis Mother   . Heart disease Mother   . Hyperlipidemia Mother   . Lung cancer Mother        Cause of death  . Hypertension Mother   . Heart disease Brother   . Valvular heart disease Brother   . Heart attack Brother   . Atrial fibrillation Brother   . Heart  failure Brother   . Miscarriages / [place] Brother   . Lung cancer Father   . Leukemia Brother   . Coronary artery disease Brother        MI in late 58's. Still living  . Ovarian cancer Maternal Aunt   . Diabetes Maternal Aunt   . Coronary artery disease Brother        MI in 56's. Also still living.  . Colon cancer Neg Hx   .  Colon polyps Neg Hx     Review of Systems  Constitutional: Negative for chills and fever.  Respiratory: Negative for cough, shortness of breath and wheezing.   Cardiovascular: Positive for chest pain (mild tightness - anxiety related, occ). Negative for palpitations and leg swelling.  Neurological: Negative for light-headedness and headaches.       Objective:   Vitals:   06/08/19 1021  BP: 134/72  Pulse: 68   BP Readings from Last 3 Encounters:  06/08/19 134/72  06/08/19 134/72  01/19/19 (!) 160/78   Wt Readings from Last 3 Encounters:  06/08/19 109 lb (49.4 kg)  01/19/19 110 lb (49.9 kg)  08/11/18 108 lb 0.4 oz (49 kg)   There is no height or weight on file to calculate BMI.   Physical Exam    Constitutional: Appears well-developed and well-nourished. No distress.  HENT:  Head: Normocephalic and atraumatic.  Neck: Neck supple. No tracheal deviation present. No thyromegaly present.  No cervical lymphadenopathy Cardiovascular: Normal rate, regular rhythm and normal heart sounds.   No murmur heard. No carotid bruit .  No edema Pulmonary/Chest: Effort normal and breath sounds normal. No respiratory distress. No has no wheezes. No rales.  Skin: Skin is warm and dry. Not diaphoretic.  Psychiatric: Normal mood and affect. Behavior is normal.      Assessment & Plan:    See Problem List for Assessment and Plan of chronic medical problems.

## 2019-06-08 ENCOUNTER — Encounter: Payer: Self-pay | Admitting: Internal Medicine

## 2019-06-08 ENCOUNTER — Other Ambulatory Visit (INDEPENDENT_AMBULATORY_CARE_PROVIDER_SITE_OTHER): Payer: Medicare Other

## 2019-06-08 ENCOUNTER — Other Ambulatory Visit: Payer: Self-pay

## 2019-06-08 ENCOUNTER — Ambulatory Visit (INDEPENDENT_AMBULATORY_CARE_PROVIDER_SITE_OTHER): Payer: Medicare Other | Admitting: Internal Medicine

## 2019-06-08 ENCOUNTER — Ambulatory Visit (INDEPENDENT_AMBULATORY_CARE_PROVIDER_SITE_OTHER): Payer: Medicare Other | Admitting: *Deleted

## 2019-06-08 VITALS — BP 134/72 | HR 68 | Temp 98.1°F | Resp 17 | Ht 60.0 in | Wt 109.0 lb

## 2019-06-08 VITALS — BP 134/72 | HR 68

## 2019-06-08 DIAGNOSIS — R739 Hyperglycemia, unspecified: Secondary | ICD-10-CM | POA: Diagnosis not present

## 2019-06-08 DIAGNOSIS — M810 Age-related osteoporosis without current pathological fracture: Secondary | ICD-10-CM | POA: Diagnosis not present

## 2019-06-08 DIAGNOSIS — I10 Essential (primary) hypertension: Secondary | ICD-10-CM

## 2019-06-08 DIAGNOSIS — Z780 Asymptomatic menopausal state: Secondary | ICD-10-CM

## 2019-06-08 DIAGNOSIS — E785 Hyperlipidemia, unspecified: Secondary | ICD-10-CM | POA: Diagnosis not present

## 2019-06-08 DIAGNOSIS — Z23 Encounter for immunization: Secondary | ICD-10-CM | POA: Diagnosis not present

## 2019-06-08 DIAGNOSIS — N390 Urinary tract infection, site not specified: Secondary | ICD-10-CM

## 2019-06-08 DIAGNOSIS — Z0000 Encounter for general adult medical examination without abnormal findings: Secondary | ICD-10-CM

## 2019-06-08 DIAGNOSIS — F411 Generalized anxiety disorder: Secondary | ICD-10-CM

## 2019-06-08 LAB — COMPREHENSIVE METABOLIC PANEL
ALT: 18 U/L (ref 0–35)
AST: 19 U/L (ref 0–37)
Albumin: 5.1 g/dL (ref 3.5–5.2)
Alkaline Phosphatase: 123 U/L — ABNORMAL HIGH (ref 39–117)
BUN: 8 mg/dL (ref 6–23)
CO2: 28 mEq/L (ref 19–32)
Calcium: 9.8 mg/dL (ref 8.4–10.5)
Chloride: 97 mEq/L (ref 96–112)
Creatinine, Ser: 0.64 mg/dL (ref 0.40–1.20)
GFR: 92.79 mL/min (ref >60.00)
Glucose, Bld: 111 mg/dL — ABNORMAL HIGH (ref 70–99)
Potassium: 4.1 mEq/L (ref 3.5–5.1)
Sodium: 134 mEq/L — ABNORMAL LOW (ref 135–145)
Total Bilirubin: 0.9 mg/dL (ref 0.2–1.2)
Total Protein: 7.1 g/dL (ref 6.0–8.3)

## 2019-06-08 LAB — CBC WITH DIFFERENTIAL/PLATELET
Basophils Absolute: 0 10*3/uL (ref 0.0–0.1)
Basophils Relative: 0.5 % (ref 0.0–3.0)
Eosinophils Absolute: 0.1 10*3/uL (ref 0.0–0.7)
Eosinophils Relative: 1.3 % (ref 0.0–5.0)
HCT: 39.1 % (ref 36.0–46.0)
Hemoglobin: 13.6 g/dL (ref 12.0–15.0)
Lymphocytes Relative: 20.2 % (ref 12.0–46.0)
Lymphs Abs: 1.2 10*3/uL (ref 0.7–4.0)
MCHC: 34.9 g/dL (ref 30.0–36.0)
MCV: 91.5 fl (ref 78.0–100.0)
Monocytes Absolute: 0.5 10*3/uL (ref 0.1–1.0)
Monocytes Relative: 7.8 % (ref 3.0–12.0)
Neutro Abs: 4.1 10*3/uL (ref 1.4–7.7)
Neutrophils Relative %: 70.2 % (ref 43.0–77.0)
Platelets: 248 10*3/uL (ref 150.0–400.0)
RBC: 4.28 Mil/uL (ref 3.87–5.11)
RDW: 13.3 % (ref 11.5–15.5)
WBC: 5.8 10*3/uL (ref 4.0–10.5)

## 2019-06-08 LAB — LIPID PANEL
Cholesterol: 214 mg/dL — ABNORMAL HIGH (ref 0–200)
HDL: 106 mg/dL (ref >39.00)
LDL Cholesterol: 95 mg/dL (ref 0–99)
NonHDL: 108.14
Total CHOL/HDL Ratio: 2
Triglycerides: 67 mg/dL (ref 0.0–149.0)
VLDL: 13.4 mg/dL (ref 0.0–40.0)

## 2019-06-08 LAB — TSH: TSH: 1.67 u[IU]/mL (ref 0.35–4.50)

## 2019-06-08 LAB — HEMOGLOBIN A1C: Hgb A1c MFr Bld: 5.4 % (ref 4.6–6.5)

## 2019-06-08 NOTE — Assessment & Plan Note (Signed)
Check a1c Low sugar / carb diet Stressed regular exercise   

## 2019-06-08 NOTE — Assessment & Plan Note (Signed)
Check lipid panel,cmp ,tsh Continue daily statin Regular exercise and healthy diet encouraged  

## 2019-06-08 NOTE — Assessment & Plan Note (Signed)
BP well controlled Current regimen effective and well tolerated Continue current medications at current doses cmp  

## 2019-06-08 NOTE — Patient Instructions (Signed)
Continue doing brain stimulating activities (puzzles, reading, adult coloring books, staying active) to keep memory sharp.   Continue to eat heart healthy diet (full of fruits, vegetables, whole grains, lean protein, water--limit salt, fat, and sugar intake) and increase physical activity as tolerated.   [name] , Thank you for taking time to come for your Medicare Wellness Visit. I appreciate your ongoing commitment to your health goals. Please review the following plan we discussed and let me know if I can assist you in the future.   These are the goals we discussed: Goals    . Patient Stated     Maintain current health status.       This is a list of the screening recommended for you and due dates:  Health Maintenance  Topic Date Due  . Pneumonia vaccines (1 of 2 - PCV13) 04/01/2018  . Mammogram  02/16/2019  . DEXA scan (bone density measurement)  02/16/2019  . Flu Shot  04/08/2019  . Colon Cancer Screening  05/22/2025  . Tetanus Vaccine  11/07/2025  .  Hepatitis C: One time screening is recommended by Center for Disease Control  (CDC) for  adults born from [birth year] through 1963.   Completed    Preventive Care 61 Years and Older, Female Preventive care refers to lifestyle choices and visits with your health care provider that can promote health and wellness. This includes:  A yearly physical exam. This is also called an annual well check.  Regular dental and eye exams.  Immunizations.  Screening for certain conditions.  Healthy lifestyle choices, such as diet and exercise. What can I expect for my preventive care visit? Physical exam Your health care provider will check:  Height and weight. These may be used to calculate body mass index (BMI), which is a measurement that tells if you are at a healthy weight.  Heart rate and blood pressure.  Your skin for abnormal spots. Counseling Your health care provider may ask you questions about:  Alcohol, tobacco, and drug  use.  Emotional well-being.  Home and relationship well-being.  Sexual activity.  Eating habits.  History of falls.  Memory and ability to understand (cognition).  Work and work Statistician.  Pregnancy and menstrual history. What immunizations do I need?  Influenza (flu) vaccine  This is recommended every year. Tetanus, diphtheria, and pertussis (Tdap) vaccine  You may need a Td booster every 10 years. Varicella (chickenpox) vaccine  You may need this vaccine if you have not already been vaccinated. Zoster (shingles) vaccine  You may need this after age 17. Pneumococcal conjugate (PCV13) vaccine  One dose is recommended after age 8. Pneumococcal polysaccharide (PPSV23) vaccine  One dose is recommended after age 48. Measles, mumps, and rubella (MMR) vaccine  You may need at least one dose of MMR if you were born in 1957 or later. You may also need a second dose. Meningococcal conjugate (MenACWY) vaccine  You may need this if you have certain conditions. Hepatitis A vaccine  You may need this if you have certain conditions or if you travel or work in places where you may be exposed to hepatitis A. Hepatitis B vaccine  You may need this if you have certain conditions or if you travel or work in places where you may be exposed to hepatitis B. Haemophilus influenzae type b (Hib) vaccine  You may need this if you have certain conditions. You may receive vaccines as individual doses or as more than one vaccine together in one  shot (combination vaccines). Talk with your health care provider about the risks and benefits of combination vaccines. What tests do I need? Blood tests  Lipid and cholesterol levels. These may be checked every 5 years, or more frequently depending on your overall health.  Hepatitis C test.  Hepatitis B test. Screening  Lung cancer screening. You may have this screening every year starting at age 54 if you have a 30-pack-year history of  smoking and currently smoke or have quit within the past 15 years.  Colorectal cancer screening. All adults should have this screening starting at age 77 and continuing until age 44. Your health care provider may recommend screening at age 66 if you are at increased risk. You will have tests every 1-10 years, depending on your results and the type of screening test.  Diabetes screening. This is done by checking your blood sugar (glucose) after you have not eaten for a while (fasting). You may have this done every 1-3 years.  Mammogram. This may be done every 1-2 years. Talk with your health care provider about how often you should have regular mammograms.  BRCA-related cancer screening. This may be done if you have a family history of breast, ovarian, tubal, or peritoneal cancers. Other tests  Sexually transmitted disease (STD) testing.  Bone density scan. This is done to screen for osteoporosis. You may have this done starting at age 31. Follow these instructions at home: Eating and drinking  Eat a diet that includes fresh fruits and vegetables, whole grains, lean protein, and low-fat dairy products. Limit your intake of foods with high amounts of sugar, saturated fats, and salt.  Take vitamin and mineral supplements as recommended by your health care provider.  Do not drink alcohol if your health care provider tells you not to drink.  If you drink alcohol: ? Limit how much you have to 0-1 drink a day. ? Be aware of how much alcohol is in your drink. In the [place] one drink equals one 12 oz bottle of beer (355 mL), one 5 oz glass of [name] (148 mL), or one 1 oz glass of hard liquor (44 mL). Lifestyle  Take daily care of your teeth and gums.  Stay active. Exercise for at least 30 minutes on 5 or more days each week.  Do not use any products that contain nicotine or tobacco, such as cigarettes, e-cigarettes, and chewing tobacco. If you need help quitting, ask your health care provider.   If you are sexually active, practice safe sex. Use a condom or other form of protection in order to prevent STIs (sexually transmitted infections).  Talk with your health care provider about taking a low-dose aspirin or statin. What's next?  Go to your health care provider once a year for a well check visit.  Ask your health care provider how often you should have your eyes and teeth checked.  Stay up to date on all vaccines. This information is not intended to replace advice given to you by your health care provider. Make sure you discuss any questions you have with your health care provider. Document Released: 09/20/2015 Document Revised: 08/18/2018 Document Reviewed: 08/18/2018 Elsevier Patient Education  2020 [employer].

## 2019-06-08 NOTE — Assessment & Plan Note (Signed)
On macrobid for prevention - has been on it for years No UTI symptoms Continue macrobid

## 2019-06-08 NOTE — Assessment & Plan Note (Signed)
Xanax prn only continue

## 2019-07-07 ENCOUNTER — Other Ambulatory Visit: Payer: Self-pay | Admitting: Internal Medicine

## 2019-07-17 ENCOUNTER — Encounter: Payer: Self-pay | Admitting: Internal Medicine

## 2019-07-19 MED ORDER — METHOCARBAMOL 500 MG PO TABS: 500.0000 mg | ORAL_TABLET | Freq: Two times a day (BID) | ORAL | 2 refills | Status: DC | PRN

## 2019-07-19 NOTE — Addendum Note (Signed)
Addended by: [name] on: 07/19/2019 07:40 AM   Modules accepted: Orders

## 2019-07-24 ENCOUNTER — Other Ambulatory Visit: Payer: Self-pay | Admitting: Internal Medicine

## 2019-07-25 ENCOUNTER — Encounter: Payer: Self-pay | Admitting: Internal Medicine

## 2019-07-25 MED ORDER — NITROFURANTOIN MONOHYD MACRO 100 MG PO CAPS: 100.0000 mg | ORAL_CAPSULE | Freq: Every day | ORAL | 1 refills | Status: DC

## 2019-09-23 ENCOUNTER — Other Ambulatory Visit: Payer: Self-pay | Admitting: Internal Medicine

## 2019-10-02 ENCOUNTER — Ambulatory Visit: Payer: Medicare Other | Attending: Internal Medicine

## 2019-10-02 DIAGNOSIS — Z23 Encounter for immunization: Secondary | ICD-10-CM | POA: Insufficient documentation

## 2019-10-02 NOTE — Progress Notes (Signed)
   Covid-19 Vaccination Clinic  Name:  [name]    MRN: [id number] DOB: 07/17/1951  10/02/2019  [name] was observed post Covid-19 immunization for 15 minutes without incidence. She was provided with Vaccine Information Sheet and instruction to access the V-Safe system.   [name] was instructed to call 911 with any severe reactions post vaccine: [name] Difficulty breathing  . Swelling of your face and throat  . A fast heartbeat  . A bad rash all over your body  . Dizziness and weakness    Immunizations Administered    Name Date Dose VIS Date Route   Pfizer COVID-19 Vaccine 10/02/2019  1:10 PM 0.3 mL 08/18/2019 Intramuscular   Manufacturer: [place]   Lot: [id number]   [place]: [id number]

## 2019-10-20 ENCOUNTER — Other Ambulatory Visit: Payer: Self-pay | Admitting: Internal Medicine

## 2019-10-23 ENCOUNTER — Ambulatory Visit: Payer: Medicare Other | Attending: Internal Medicine

## 2019-10-23 DIAGNOSIS — Z23 Encounter for immunization: Secondary | ICD-10-CM | POA: Insufficient documentation

## 2019-10-23 NOTE — Progress Notes (Signed)
   Covid-19 Vaccination Clinic  Name:  [name]    MRN: [id number] DOB: [birth year]  10/23/2019  [name] was observed post Covid-19 immunization for 15 minutes without incidence. She was provided with Vaccine Information Sheet and instruction to access the V-Safe system.   [name] was instructed to call 911 with any severe reactions post vaccine: [name] Difficulty breathing  . Swelling of your face and throat  . A fast heartbeat  . A bad rash all over your body  . Dizziness and weakness    Immunizations Administered    Name Date Dose VIS Date Route   Pfizer COVID-19 Vaccine 10/23/2019 12:10 PM 0.3 mL 08/18/2019 Intramuscular   Manufacturer: [place]   Lot: [id number]   [place]: [id number]

## 2019-12-06 NOTE — Progress Notes (Signed)
Subjective:    Patient ID: [name], female    DOB: 05/04/1951, 69 y.o.   MRN: [id number]  HPI The patient is here for follow up of their chronic medical problems, including hypertension, OP, anxiety, hyperlipidemia, recurrent UTI  She is taking all of her medications as prescribed.   She is exercising regularly.  Walks 3-5 miles daily.    After eating or while eating - gets sharp pain lasts few secs in RUQ. Then later an ache in the LMQ that is not has severe and lasts sev sec 5-10.  The pain is not daily.  She has not noticed any association with what she ate or bowel movements.  Sometimes after eating she will feel her lower abdomen is distended.  Her bowel movements have been normal and she denies constipation.  She has not noticed any association with her symptoms with gas.  Medications and allergies reviewed with patient and updated if appropriate.  Patient Active Problem List   Diagnosis Date Noted  . Hyperglycemia 06/07/2019  . DDD (degenerative disc disease), cervical 02/02/2019  . Musculoskeletal neck pain 10/14/2018  . Change in bowel function 08/11/2018  . Lower abdominal pain 08/11/2018  . Diverticulitis 08/11/2018  . Shoulder pain, left 02/23/2017  . B12 deficiency 08/07/2016  . Recurrent UTI 11/08/2015  . Non-celiac gluten sensitivity   . Anxiety state 01/17/2010  . ALLERGIC RHINITIS 01/17/2010  . Dyslipidemia 07/12/2009  . Essential hypertension 07/12/2009  . Osteoporosis 06/22/2008  . 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[place]) 10/14/2007  . ANEMIA, PERNICIOUS 10/14/2007  . IRRITABLE BOWEL SYNDROME 10/14/2007    Current Outpatient Medications on File Prior to Visit  Medication Sig Dispense Refill  . ALPRAZolam (XANAX) 0.25 MG tablet Take 1 tablet (0.25 mg total) by mouth 2 (two) times daily as needed for anxiety. 60 tablet 0  . amLODipine (NORVASC) 10 MG tablet TAKE 1 TABLET BY MOUTH EVERY DAY 90 tablet 1  . calcium-vitamin D (OSCAL WITH D) 500-200  MG-UNIT tablet Take 1 tablet by mouth daily with breakfast.    . cholecalciferol (VITAMIN D3) 25 MCG (1000 UT) tablet Take 1,000 Units by mouth daily.    . diclofenac (VOLTAREN) 75 MG EC tablet Take 1 tablet (75 mg total) by mouth 2 (two) times daily as needed for mild pain.    . hydrochlorothiazide (HYDRODIURIL) 12.5 MG tablet TAKE 1 TABLET BY MOUTH EVERY DAY 90 tablet 1  . Ibuprofen-diphenhydrAMINE HCl (IBUPROFEN PM) 200-25 MG CAPS Take 1 tablet by mouth at bedtime as needed (pain/sleep).    . nitrofurantoin, macrocrystal-monohydrate, (MACROBID) 100 MG capsule Take 1 capsule (100 mg total) by mouth daily. 90 capsule 1  . pravastatin (PRAVACHOL) 40 MG tablet TAKE 1 TABLET BY MOUTH EVERY DAY 90 tablet 0  . pseudoephedrine (SUDAFED) 30 MG tablet Take 30 mg by mouth every 4 (four) hours as needed for congestion.    . vitamin B-12 (CYANOCOBALAMIN) 1000 MCG tablet Take 1 tablet (1,000 mcg total) by mouth daily.     No current facility-administered medications on file prior to visit.    Past Medical History:  Diagnosis Date  . ALLERGIC RHINITIS   . ANEMIA, PERNICIOUS   . ANXIETY   . BILIARY DYSKINESIA   . CARPAL TUNNEL SYNDROME, LEFT   . DYSLIPIDEMIA   . Headache(784.0)   . HYPERTENSION   . Irritable bowel syndrome   . Non-celiac gluten sensitivity   . OSTEOPENIA   . 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[facility])    borderline  Past Surgical History:  Procedure Laterality Date  . APPENDECTOMY    . CARPAL TUNNEL RELEASE    . CESAREAN SECTION  74 & 83   x's 2   . CHOLECYSTECTOMY  2005   Dr. Dalbert Batman (Gallbladder dyskinesia)  . COLONOSCOPY    . DILATION AND CURETTAGE OF UTERUS    . LEFT HEART CATHETERIZATION WITH CORONARY ANGIOGRAM N/A 05/19/2012   Procedure: LEFT HEART CATHETERIZATION WITH CORONARY ANGIOGRAM;  Surgeon: Hillary Bow, MD;  Location: [facility] CATH LAB;  Service: Cardiovascular;  Laterality: N/A;  . ROTATOR CUFF REPAIR    . TONSILLECTOMY    . UPPER GASTROINTESTINAL ENDOSCOPY       Social History   Socioeconomic History  . Marital status: Married    Spouse name: Not on file  . Number of children: Not on file  . Years of education: Not on file  . Highest education level: Not on file  Occupational History  . 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 Smoking status: Never Smoker  . Smokeless tobacco: Never Used  Substance and Sexual Activity  . Alcohol use: Yes    Alcohol/week: 1.0 standard drinks    Types: 1 Glasses of wine per week    Comment: occassional  . Drug use: No  . Sexual activity: Yes    Birth control/protection: Post-menopausal  Other Topics Concern  . Not on file  Social History Narrative   Married and lives with husband.    Copywriter, advertising - retired   Scientist, physiological Strain: [place]   . Difficulty of Paying Living Expenses: Not hard at all  Food Insecurity: No Food Insecurity  . Worried About Charity fundraiser in the Last Year: Never true  . Ran Out of Food in the Last Year: Never true  Transportation Needs: No Transportation Needs  . Lack of Transportation (Medical): No  . Lack of Transportation (Non-Medical): No  Physical Activity: Sufficiently Active  . Days of Exercise per Week: 5 days  . Minutes of Exercise per Session: 50 min  Stress: No Stress Concern Present  . Feeling of Stress : Not at all  Social Connections: Not Isolated  . Frequency of Communication with Friends and Family: More than three times a week  . Frequency of Social Gatherings with Friends and Family: More than three times a week  . Attends Religious Services: 1 to 4 times per year  . Active Member of Clubs or Organizations: Yes  . Attends Archivist Meetings: More than 4 times per year  . Marital Status: Married    Family History  Problem Relation Age of Onset  . Colitis Mother   . Heart disease Mother   . Hyperlipidemia Mother   . Lung cancer Mother         Cause of death  . Hypertension Mother   . Heart disease Brother   . Valvular heart disease Brother   . Heart attack Brother   . Atrial fibrillation Brother   . Heart failure Brother   . Miscarriages / [place] Brother   . Lung cancer Father   . Leukemia Brother   . Coronary artery disease Brother        MI in late 54's. Still living  . Ovarian cancer Maternal Aunt   . Diabetes Maternal Aunt   . Coronary artery disease Brother        MI in 35's. Also  still living.  . Colon cancer Neg Hx   . Colon polyps Neg Hx     Review of Systems  Constitutional: Negative for chills and fever.  Respiratory: Positive for cough (allergy related). Negative for shortness of breath and wheezing.   Cardiovascular: Negative for chest pain, palpitations and leg swelling.  Gastrointestinal: Positive for abdominal distention and abdominal pain. Negative for constipation and diarrhea.  Genitourinary: Negative for dysuria and hematuria.  Musculoskeletal: Positive for neck pain.  Neurological: Negative for light-headedness and headaches.       Objective:   Vitals:   12/07/19 1045  BP: (!) 154/82  Pulse: 67  Resp: 16  Temp: 98.5 F (36.9 C)  SpO2: 99%   BP Readings from Last 3 Encounters:  12/07/19 (!) 154/82  06/08/19 134/72  06/08/19 134/72   Wt Readings from Last 3 Encounters:  12/07/19 113 lb (51.3 kg)  06/08/19 109 lb (49.4 kg)  01/19/19 110 lb (49.9 kg)   Body mass index is 22.07 kg/m.   Physical Exam    Constitutional: Appears well-developed and well-nourished. No distress.  HENT:  Head: Normocephalic and atraumatic.  Neck: Neck supple. No tracheal deviation present. No thyromegaly present.  No cervical lymphadenopathy Cardiovascular: Normal rate, regular rhythm and normal heart sounds.   No murmur heard. No carotid bruit .  No edema Pulmonary/Chest: Effort normal and breath sounds normal. No respiratory distress. No has no wheezes. No rales.  Skin: Skin is warm and dry.  Not diaphoretic.  Psychiatric: Normal mood and affect. Behavior is normal.      Assessment & Plan:    See Problem List for Assessment and Plan of chronic medical problems.    This visit occurred during the SARS-CoV-2 public health emergency.  Safety protocols were in place, including screening questions prior to the visit, additional usage of staff PPE, and extensive cleaning of exam room while observing appropriate contact time as indicated for disinfecting solutions.

## 2019-12-06 NOTE — Patient Instructions (Addendum)
  Medications reviewed and updated.  Changes include :   None        Please followup in 6 months   

## 2019-12-07 ENCOUNTER — Ambulatory Visit (INDEPENDENT_AMBULATORY_CARE_PROVIDER_SITE_OTHER): Payer: Medicare Other | Admitting: Internal Medicine

## 2019-12-07 ENCOUNTER — Encounter: Payer: Self-pay | Admitting: Internal Medicine

## 2019-12-07 ENCOUNTER — Other Ambulatory Visit: Payer: Self-pay

## 2019-12-07 VITALS — BP 154/82 | HR 67 | Temp 98.5°F | Resp 16 | Ht 60.0 in | Wt 113.0 lb

## 2019-12-07 DIAGNOSIS — N390 Urinary tract infection, site not specified: Secondary | ICD-10-CM | POA: Diagnosis not present

## 2019-12-07 DIAGNOSIS — E785 Hyperlipidemia, unspecified: Secondary | ICD-10-CM | POA: Diagnosis not present

## 2019-12-07 DIAGNOSIS — F411 Generalized anxiety disorder: Secondary | ICD-10-CM | POA: Diagnosis not present

## 2019-12-07 DIAGNOSIS — I10 Essential (primary) hypertension: Secondary | ICD-10-CM | POA: Diagnosis not present

## 2019-12-07 DIAGNOSIS — M542 Cervicalgia: Secondary | ICD-10-CM

## 2019-12-07 DIAGNOSIS — R109 Unspecified abdominal pain: Secondary | ICD-10-CM | POA: Insufficient documentation

## 2019-12-07 NOTE — Assessment & Plan Note (Addendum)
Chronic Has been on macrobid for prevention No UTI symptoms since she was here last Will continue

## 2019-12-07 NOTE — Assessment & Plan Note (Signed)
Abdominal pain is very nonspecific and only lasts a few seconds-does not seem to be related to any specific GU or GI symptoms Possibly related to gas or constipation She does have IBS and gluten sensitivity  --may be related to something she is eating No pain here today She will let me know if this persists

## 2019-12-07 NOTE — Assessment & Plan Note (Signed)
Chronic Check lipid panel  Continue daily statin Regular exercise and healthy diet encouraged  

## 2019-12-07 NOTE — Assessment & Plan Note (Signed)
Chronic, intermittent Taking xanax as needed on occasion continue

## 2019-12-07 NOTE — Assessment & Plan Note (Addendum)
Chronic Taking advil daily, which helps - advised to keep advil to a minimum Methocarbamol did not help Try voltaren gel, topical arthritis meds otc

## 2019-12-07 NOTE — Assessment & Plan Note (Addendum)
Chronic BP well controlled at home, elevated here Current regimen effective and well tolerated Continue current medications at current doses cmp

## 2019-12-22 ENCOUNTER — Encounter: Payer: Self-pay | Admitting: Internal Medicine

## 2019-12-24 ENCOUNTER — Other Ambulatory Visit: Payer: Self-pay | Admitting: Internal Medicine

## 2019-12-26 NOTE — Telephone Encounter (Signed)
Last OV 12/07/19 Next OV 06/07/20 Last RF 01/02/19

## 2019-12-31 ENCOUNTER — Other Ambulatory Visit: Payer: Self-pay | Admitting: Internal Medicine

## 2020-01-16 ENCOUNTER — Other Ambulatory Visit: Payer: Self-pay | Admitting: Internal Medicine

## 2020-01-18 ENCOUNTER — Telehealth: Payer: Self-pay | Admitting: Internal Medicine

## 2020-01-18 MED ORDER — NITROFURANTOIN MONOHYD MACRO 100 MG PO CAPS: 100.0000 mg | ORAL_CAPSULE | Freq: Every day | ORAL | 1 refills | Status: DC

## 2020-01-18 NOTE — Telephone Encounter (Signed)
sent 

## 2020-01-18 NOTE — Telephone Encounter (Signed)
New Message:   1.Medication Requested: nitrofurantoin, macrocrystal-monohydrate, (MACROBID) 100 MG capsule 2. Pharmacy (Name, Street, [place]): [facility] [address] 3. On Med List: Yes  4. Last Visit with PCP: 12/07/19  5. Next visit date with PCP: 06/07/20  Pt states she will be heading out of town and she is almost out. Please advise. Agent: Please be advised that RX refills may take up to 3 business days. We ask that you follow-up with your pharmacy.

## 2020-02-02 ENCOUNTER — Encounter: Payer: Self-pay | Admitting: Internal Medicine

## 2020-02-02 MED ORDER — AMOXICILLIN-POT CLAVULANATE 875-125 MG PO TABS: 1.0000 | ORAL_TABLET | Freq: Two times a day (BID) | ORAL | 0 refills | Status: DC

## 2020-02-07 ENCOUNTER — Telehealth: Payer: Self-pay

## 2020-02-07 ENCOUNTER — Other Ambulatory Visit: Payer: Self-pay

## 2020-02-07 DIAGNOSIS — R350 Frequency of micturition: Secondary | ICD-10-CM

## 2020-02-07 NOTE — Telephone Encounter (Signed)
New message    The patient is asking for a urinalysis to double-check to see if the medication prescribes by Dr. Quay Burow had clear up her UTI.

## 2020-02-07 NOTE — Telephone Encounter (Signed)
LVM letting pt know she can urine rechecked. Told her to call back to let us know when she can have her urine checked so I can schedule an appointment.

## 2020-02-12 NOTE — Progress Notes (Signed)
Subjective:    Patient ID: [name], female    DOB: 12-09-1950, 69 y.o.   MRN: [id number]  HPI The patient is here for an acute visit.   Left arm pain s/p fall yesterday:  She was emptying her dishwasher and she turned too quickly and fell into it.  She hit her left arm, shoulder and her jaw and thigh.   She also noticed a bruise on her left breast and bruise on the right side of her tongue.  She does not recall biting her tongue.  She denies any loss of consciousness.  Last night she had an achy, stinging feeling from her elbow down in the left forearm and she was concerned whether she injures herself more than she realized.  She does have some bruising there.  She did take Advil and iced it and that did seem to help.    Her dog also happened to bite her on her right forearm and she has a injury there.  She just wanted to make sure nothing was broken on the forearm.  She states full range of motion of all of her joints.  There is no numbness tingling or weakness.  Medications and allergies reviewed with patient and updated if appropriate.  Patient Active Problem List   Diagnosis Date Noted  . Abdominal pain 12/07/2019  . Hyperglycemia 06/07/2019  . DDD (degenerative disc disease), cervical 02/02/2019  . Musculoskeletal neck pain 10/14/2018  . Change in bowel function 08/11/2018  . Lower abdominal pain 08/11/2018  . Diverticulitis 08/11/2018  . Shoulder pain, left 02/23/2017  . B12 deficiency 08/07/2016  . Recurrent UTI 11/08/2015  . Non-celiac gluten sensitivity   . Anxiety state 01/17/2010  . ALLERGIC RHINITIS 01/17/2010  . Dyslipidemia 07/12/2009  . Essential hypertension 07/12/2009  . Osteoporosis 06/22/2008  . 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 ANEMIA, PERNICIOUS 10/14/2007  . IRRITABLE BOWEL SYNDROME 10/14/2007    Current Outpatient Medications on File Prior to Visit  Medication Sig Dispense Refill  . ALPRAZolam (XANAX) 0.25 MG tablet TAKE  1 TABLET(0.25 MG) BY MOUTH TWICE DAILY AS NEEDED FOR ANXIETY 60 tablet 0  . amLODipine (NORVASC) 10 MG tablet TAKE 1 TABLET BY MOUTH EVERY DAY 90 tablet 1  . calcium-vitamin D (OSCAL WITH D) 500-200 MG-UNIT tablet Take 1 tablet by mouth daily with breakfast.    . cholecalciferol (VITAMIN D3) 25 MCG (1000 UT) tablet Take 1,000 Units by mouth daily.    . diclofenac (VOLTAREN) 75 MG EC tablet Take 1 tablet (75 mg total) by mouth 2 (two) times daily as needed for mild pain.    . hydrochlorothiazide (HYDRODIURIL) 12.5 MG tablet TAKE 1 TABLET BY MOUTH EVERY DAY 90 tablet 1  . Ibuprofen-diphenhydrAMINE HCl (IBUPROFEN PM) 200-25 MG CAPS Take 1 tablet by mouth at bedtime as needed (pain/sleep).    . methocarbamol (ROBAXIN) 500 MG tablet     . nitrofurantoin, macrocrystal-monohydrate, (MACROBID) 100 MG capsule Take 1 capsule (100 mg total) by mouth daily. 90 capsule 1  . pravastatin (PRAVACHOL) 40 MG tablet TAKE 1 TABLET BY MOUTH EVERY DAY 90 tablet 0  . pseudoephedrine (SUDAFED) 30 MG tablet Take 30 mg by mouth every 4 (four) hours as needed for congestion.    . vitamin B-12 (CYANOCOBALAMIN) 1000 MCG tablet Take 1 tablet (1,000 mcg total) by mouth daily.     No current facility-administered medications on file prior to visit.    Past Medical History:  Diagnosis Date  . ALLERGIC  RHINITIS   . ANEMIA, PERNICIOUS   . ANXIETY   . BILIARY DYSKINESIA   . CARPAL TUNNEL SYNDROME, LEFT   . DYSLIPIDEMIA   . Headache(784.0)   . HYPERTENSION   . Irritable bowel syndrome   . Non-celiac gluten sensitivity   . OSTEOPENIA   . 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[place])    borderline    Past Surgical History:  Procedure Laterality Date  . APPENDECTOMY    . CARPAL TUNNEL RELEASE    . CESAREAN SECTION  74 & 83   x's 2   . CHOLECYSTECTOMY  2005   Dr. Dalbert Batman (Gallbladder dyskinesia)  . COLONOSCOPY    . DILATION AND CURETTAGE OF UTERUS    . LEFT HEART CATHETERIZATION WITH CORONARY ANGIOGRAM N/A 05/19/2012    Procedure: LEFT HEART CATHETERIZATION WITH CORONARY ANGIOGRAM;  Surgeon: Hillary Bow, MD;  Location: [facility] CATH LAB;  Service: Cardiovascular;  Laterality: N/A;  . ROTATOR CUFF REPAIR    . TONSILLECTOMY    . UPPER GASTROINTESTINAL ENDOSCOPY      Social History   Socioeconomic History  . Marital status: Married    Spouse name: Not on file  . Number of children: Not on file  . Years of education: Not on file  . Highest education level: Not on file  Occupational History  . 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 Smoking status: Never Smoker  . Smokeless tobacco: Never Used  Substance and Sexual Activity  . Alcohol use: Yes    Alcohol/week: 1.0 standard drinks    Types: 1 Glasses of wine per week    Comment: occassional  . Drug use: No  . Sexual activity: Yes    Birth control/protection: Post-menopausal  Other Topics Concern  . Not on file  Social History Narrative   Married and lives with husband.    Copywriter, advertising - retired   Scientist, physiological Strain: [place]   . Difficulty of Paying Living Expenses: Not hard at all  Food Insecurity: No Food Insecurity  . Worried About Charity fundraiser in the Last Year: Never true  . Ran Out of Food in the Last Year: Never true  Transportation Needs: No Transportation Needs  . Lack of Transportation (Medical): No  . Lack of Transportation (Non-Medical): No  Physical Activity: Sufficiently Active  . Days of Exercise per Week: 5 days  . Minutes of Exercise per Session: 50 min  Stress: No Stress Concern Present  . Feeling of Stress : Not at all  Social Connections: Not Isolated  . Frequency of Communication with Friends and Family: More than three times a week  . Frequency of Social Gatherings with Friends and Family: More than three times a week  . Attends Religious Services: 1 to 4 times per year  . Active Member of Clubs or Organizations: Yes  .  Attends Archivist Meetings: More than 4 times per year  . Marital Status: Married    Family History  Problem Relation Age of Onset  . Colitis Mother   . Heart disease Mother   . Hyperlipidemia Mother   . Lung cancer Mother        Cause of death  . Hypertension Mother   . Heart disease Brother   . Valvular heart disease Brother   . Heart attack Brother   . Atrial fibrillation Brother   . Heart failure Brother   . Miscarriages /  Stillbirths Brother   . Lung cancer Father   . Leukemia Brother   . Coronary artery disease Brother        MI in late 61's. Still living  . Ovarian cancer Maternal Aunt   . Diabetes Maternal Aunt   . Coronary artery disease Brother        MI in 43's. Also still living.  . Colon cancer Neg Hx   . Colon polyps Neg Hx     Review of Systems     Objective:   Vitals:   02/13/20 1517  BP: (!) 150/90  Pulse: 82  Temp: 98 F (36.7 C)  SpO2: 99%   BP Readings from Last 3 Encounters:  02/13/20 (!) 150/90  12/07/19 (!) 154/82  06/08/19 134/72   Wt Readings from Last 3 Encounters:  02/13/20 112 lb 3.2 oz (50.9 kg)  12/07/19 113 lb (51.3 kg)  06/08/19 109 lb (49.4 kg)   Body mass index is 21.91 kg/m.   Physical Exam Constitutional:      General: She is not in acute distress.    Appearance: Normal appearance. She is not ill-appearing.  Musculoskeletal:     Right lower leg: No edema.     Left lower leg: No edema.     Comments: Full range of motion of left shoulder, elbow and wrist without pain.  Bones in left forearm nontender to palpation  Skin:    General: Skin is warm and dry.     Comments: Bruises present on left jaw, several small lacerations/scrapes on left arm, bruising left distal forearm and left upper arm.  Are mild in nature.  Small laceration from dog bite right forearm with Band-Aid.  No open wound.  Neurological:     General: No focal deficit present.     Mental Status: She is alert and oriented to person, place, and  time.            Assessment & Plan:   Fall yesterday in the dishwasher, left arm pain with bruises, small cuts, also left jaw bruise, left lower leg bruise: Overall she is very lucky her injuries are mild Discussed that this was a fall that may have been difficult to avoid-avoid quick turning No concerning injuries-I do not think she broke anything and will not need further imaging Continue ibuprofen for muscle soreness Can try heating pad or icing She will call if symptoms worsen or do not resolve  See Problem List for Assessment and Plan of chronic medical problems.    This visit occurred during the SARS-CoV-2 public health emergency.  Safety protocols were in place, including screening questions prior to the visit, additional usage of staff PPE, and extensive cleaning of exam room while observing appropriate contact time as indicated for disinfecting solutions.

## 2020-02-13 ENCOUNTER — Ambulatory Visit (INDEPENDENT_AMBULATORY_CARE_PROVIDER_SITE_OTHER): Payer: Medicare Other | Admitting: Internal Medicine

## 2020-02-13 ENCOUNTER — Encounter: Payer: Self-pay | Admitting: Internal Medicine

## 2020-02-13 ENCOUNTER — Other Ambulatory Visit: Payer: Self-pay

## 2020-02-13 DIAGNOSIS — M79602 Pain in left arm: Secondary | ICD-10-CM | POA: Diagnosis not present

## 2020-02-13 DIAGNOSIS — R350 Frequency of micturition: Secondary | ICD-10-CM | POA: Diagnosis not present

## 2020-02-13 DIAGNOSIS — W19XXXA Unspecified fall, initial encounter: Secondary | ICD-10-CM | POA: Diagnosis not present

## 2020-02-13 NOTE — Patient Instructions (Addendum)
Continue advil as needed. Use a heating pad.    Please call if there is no improvement in your symptoms.

## 2020-02-14 ENCOUNTER — Other Ambulatory Visit: Payer: Medicare Other

## 2020-02-14 ENCOUNTER — Encounter: Payer: Self-pay | Admitting: Internal Medicine

## 2020-02-14 LAB — URINALYSIS, ROUTINE W REFLEX MICROSCOPIC
Bilirubin Urine: NEGATIVE
Ketones, ur: NEGATIVE
Leukocytes,Ua: NEGATIVE
Nitrite: NEGATIVE
Specific Gravity, Urine: 1.01 (ref 1.000–1.030)
Total Protein, Urine: NEGATIVE
Urine Glucose: NEGATIVE
Urobilinogen, UA: 0.2 (ref 0.0–1.0)
pH: 7.5 (ref 5.0–8.0)

## 2020-02-14 LAB — URINE CULTURE

## 2020-03-19 ENCOUNTER — Other Ambulatory Visit: Payer: Self-pay | Admitting: Internal Medicine

## 2020-04-14 ENCOUNTER — Other Ambulatory Visit: Payer: Self-pay | Admitting: Internal Medicine

## 2020-04-15 ENCOUNTER — Other Ambulatory Visit: Payer: Self-pay | Admitting: Internal Medicine

## 2020-05-20 ENCOUNTER — Telehealth: Payer: Self-pay | Admitting: Internal Medicine

## 2020-05-20 NOTE — Progress Notes (Signed)
°  Chronic Care Management   Note  05/20/2020 Name: [name] MRN: [id number] DOB: 1950/12/13  [name] is a 69 y.o. year old female who is a primary care patient of Burns, Claudina Lick, MD. I reached out to [name] by phone today in response to a referral sent by Ms. [name]'s PCP, [name], MD.   [name] was given information about Chronic Care Management services today including:  1. CCM service includes personalized support from designated clinical staff supervised by her physician, including individualized plan of care and coordination with other care providers 2. 24/7 contact phone numbers for assistance for urgent and routine care needs. 3. Service will only be billed when office clinical staff spend 20 minutes or more in a month to coordinate care. 4. Only one practitioner may furnish and bill the service in a calendar month. 5. The patient may stop CCM services at any time (effective at the end of the month) by phone call to the office staff.   Patient wishes to consider information provided and/or speak with a member of the care team before deciding about enrollment in care management services.   Follow up plan:   Carley Perdue [employer] Scheduler

## 2020-05-21 ENCOUNTER — Encounter: Payer: Self-pay | Admitting: Internal Medicine

## 2020-05-31 ENCOUNTER — Other Ambulatory Visit: Payer: Medicare Other

## 2020-05-31 DIAGNOSIS — Z20822 Contact with and (suspected) exposure to covid-19: Secondary | ICD-10-CM | POA: Diagnosis not present

## 2020-06-01 LAB — SARS-COV-2, NAA 2 DAY TAT

## 2020-06-01 LAB — NOVEL CORONAVIRUS, NAA: SARS-CoV-2, NAA: NOT DETECTED

## 2020-06-03 ENCOUNTER — Telehealth: Payer: Self-pay | Admitting: Internal Medicine

## 2020-06-03 ENCOUNTER — Other Ambulatory Visit: Payer: Medicare Other

## 2020-06-03 NOTE — Progress Notes (Signed)
  Chronic Care Management   Outreach Note  06/03/2020 Name: [name] MRN: [id number] DOB: 1950-09-17  Referred by: [name], MD Reason for referral : No chief complaint on file.   A second unsuccessful telephone outreach was attempted today. The patient was referred to pharmacist for assistance with care management and care coordination.  Follow Up Plan:   Carley Perdue [employer] Scheduler

## 2020-06-07 ENCOUNTER — Ambulatory Visit: Payer: Medicare Other | Admitting: Internal Medicine

## 2020-06-10 ENCOUNTER — Telehealth: Payer: Self-pay | Admitting: Internal Medicine

## 2020-06-10 NOTE — Progress Notes (Signed)
  Chronic Care Management   Note  06/10/2020 Name: [name] MRN: [id number] DOB: [birth year]  [name] is a 69 y.o. year old female who is a primary care patient of Burns, Claudina Lick, MD. I reached out to [name] by phone today in response to a referral sent by [name]'s PCP, [name], MD.   [name] was given information about Chronic Care Management services today including:  1. CCM service includes personalized support from designated clinical staff supervised by her physician, including individualized plan of care and coordination with other care providers 2. 24/7 contact phone numbers for assistance for urgent and routine care needs. 3. Service will only be billed when office clinical staff spend 20 minutes or more in a month to coordinate care. 4. Only one practitioner may furnish and bill the service in a calendar month. 5. The patient may stop CCM services at any time (effective at the end of the month) by phone call to the office staff.   Patient agreed to services and verbal consent obtained.   Follow up plan:   [name] [employer] Scheduler

## 2020-06-19 DIAGNOSIS — Z23 Encounter for immunization: Secondary | ICD-10-CM | POA: Diagnosis not present

## 2020-06-26 NOTE — Progress Notes (Signed)
Subjective:    Patient ID: [name], female    DOB: 29-Aug-1951, 69 y.o.   MRN: [id number]  HPI The patient is here for follow up of their chronic medical problems, including htn, OP, hyperlipidemia, anxiety, recurrent UTI  She is exercising regularly.   She walks daily. She did some water aerobics  She is taking all of her medications as prescribed.     Her neck has been hurting more.  A couple of months ago she was getting a lot of left posterior headaches that ran up her head.  The left side of her neck feels different. Takes 600 mg ibuprofen in morning and occasionally later in day.      Medications and allergies reviewed with patient and updated if appropriate.  Patient Active Problem List   Diagnosis Date Noted  . Fall 02/13/2020  . Left arm pain 02/13/2020  . Abdominal pain 12/07/2019  . Hyperglycemia 06/07/2019  . DDD (degenerative disc disease), cervical 02/02/2019  . Musculoskeletal neck pain 10/14/2018  . Change in bowel function 08/11/2018  . Lower abdominal pain 08/11/2018  . Diverticulitis 08/11/2018  . Shoulder pain, left 02/23/2017  . B12 deficiency 08/07/2016  . Recurrent UTI 11/08/2015  . Non-celiac gluten sensitivity   . Anxiety state 01/17/2010  . ALLERGIC RHINITIS 01/17/2010  . Dyslipidemia 07/12/2009  . Essential hypertension 07/12/2009  . Osteoporosis 06/22/2008  . 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 ANEMIA, PERNICIOUS 10/14/2007  . IRRITABLE BOWEL SYNDROME 10/14/2007    Current Outpatient Medications on File Prior to Visit  Medication Sig Dispense Refill  . ALPRAZolam (XANAX) 0.25 MG tablet TAKE 1 TABLET(0.25 MG) BY MOUTH TWICE DAILY AS NEEDED FOR ANXIETY 60 tablet 0  . amLODipine (NORVASC) 10 MG tablet TAKE 1 TABLET BY MOUTH EVERY DAY 90 tablet 1  . calcium-vitamin D (OSCAL WITH D) 500-200 MG-UNIT tablet Take 1 tablet by mouth daily with breakfast.    . cholecalciferol (VITAMIN D3) 25 MCG (1000 UT) tablet Take 1,000  Units by mouth daily.    . diclofenac (VOLTAREN) 75 MG EC tablet Take 1 tablet (75 mg total) by mouth 2 (two) times daily as needed for mild pain.    . hydrochlorothiazide (HYDRODIURIL) 12.5 MG tablet TAKE 1 TABLET BY MOUTH EVERY DAY 90 tablet 1  . Ibuprofen-diphenhydrAMINE HCl (IBUPROFEN PM) 200-25 MG CAPS Take 1 tablet by mouth at bedtime as needed (pain/sleep).    . methocarbamol (ROBAXIN) 500 MG tablet TAKE 1 TABLET(500 MG) BY MOUTH TWICE DAILY AS NEEDED FOR MUSCLE SPASMS 30 tablet 0  . nitrofurantoin, macrocrystal-monohydrate, (MACROBID) 100 MG capsule Take 1 capsule (100 mg total) by mouth daily. 90 capsule 1  . pravastatin (PRAVACHOL) 40 MG tablet Take 1 tablet (40 mg total) by mouth daily. Annual appt w/labs due in Octt must see provider for future refills 90 tablet 0  . pseudoephedrine (SUDAFED) 30 MG tablet Take 30 mg by mouth every 4 (four) hours as needed for congestion.    . vitamin B-12 (CYANOCOBALAMIN) 1000 MCG tablet Take 1 tablet (1,000 mcg total) by mouth daily.     No current facility-administered medications on file prior to visit.    Past Medical History:  Diagnosis Date  . ALLERGIC RHINITIS   . ANEMIA, PERNICIOUS   . ANXIETY   . BILIARY DYSKINESIA   . CARPAL TUNNEL SYNDROME, LEFT   . DYSLIPIDEMIA   . Headache(784.0)   . HYPERTENSION   . Irritable bowel syndrome   . Non-celiac gluten sensitivity   .  OSTEOPENIA   . 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 APPENDECTOMY    . CARPAL TUNNEL RELEASE    . CESAREAN SECTION  76 & 85   x's 2   . CHOLECYSTECTOMY  2005   Dr. Dalbert Batman (Gallbladder dyskinesia)  . COLONOSCOPY    . DILATION AND CURETTAGE OF UTERUS    . LEFT HEART CATHETERIZATION WITH CORONARY ANGIOGRAM N/A 05/19/2012   Procedure: LEFT HEART CATHETERIZATION WITH CORONARY ANGIOGRAM;  Surgeon: Hillary Bow, MD;  Location: [facility] CATH LAB;  Service: Cardiovascular;  Laterality: N/A;  . ROTATOR  CUFF REPAIR    . TONSILLECTOMY    . UPPER GASTROINTESTINAL ENDOSCOPY      Social History   Socioeconomic History  . Marital status: Married    Spouse name: Not on file  . Number of children: Not on file  . Years of education: Not on file  . Highest education level: Not on file  Occupational History  . 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 Smoking status: Never Smoker  . Smokeless tobacco: Never Used  Vaping Use  . Vaping Use: Never used  Substance and Sexual Activity  . Alcohol use: Yes    Alcohol/week: 1.0 standard drink    Types: 1 Glasses of wine per week    Comment: occassional  . Drug use: No  . Sexual activity: Yes    Birth control/protection: Post-menopausal  Other Topics Concern  . Not on file  Social History Narrative   Married and lives with husband.    Copywriter, advertising - retired   Scientist, physiological Strain:   . Difficulty of Paying Living Expenses: Not on file  Food Insecurity:   . Worried About Charity fundraiser in the Last Year: Not on file  . Ran Out of Food in the Last Year: Not on file  Transportation Needs:   . Lack of Transportation (Medical): Not on file  . Lack of Transportation (Non-Medical): Not on file  Physical Activity:   . Days of Exercise per Week: Not on file  . Minutes of Exercise per Session: Not on file  Stress:   . Feeling of Stress : Not on file  Social Connections:   . Frequency of Communication with Friends and Family: Not on file  . Frequency of Social Gatherings with Friends and Family: Not on file  . Attends Religious Services: Not on file  . Active Member of Clubs or Organizations: Not on file  . Attends Archivist Meetings: Not on file  . Marital Status: Not on file    Family History  Problem Relation Age of Onset  . Colitis Mother   . Heart disease Mother   . Hyperlipidemia Mother   . Lung cancer Mother        Cause of death    . Hypertension Mother   . Heart disease Brother   . Valvular heart disease Brother   . Heart attack Brother   . Atrial fibrillation Brother   . Heart failure Brother   . Miscarriages / [place] Brother   . Lung cancer Father   . Leukemia Brother   . Coronary artery disease Brother        MI in late 63's. Still living  . Ovarian cancer Maternal Aunt   . Diabetes Maternal Aunt   . Coronary artery  disease Brother        MI in 57's. Also still living.  . Colon cancer Neg Hx   . Colon polyps Neg Hx     Review of Systems  Constitutional: Negative for chills and fever.  Respiratory: Negative for cough, shortness of breath and wheezing.   Cardiovascular: Negative for chest pain, palpitations and leg swelling.  Genitourinary: Negative for dysuria, frequency, hematuria and urgency.  Musculoskeletal: Positive for neck pain.  Neurological: Positive for headaches (posterior from neck). Negative for light-headedness.       Objective:   Vitals:   06/27/20 0827  BP: (!) 144/76  Pulse: 66  Temp: 98.1 F (36.7 C)  SpO2: 95%   BP Readings from Last 3 Encounters:  06/27/20 (!) 144/76  02/13/20 (!) 150/90  12/07/19 (!) 154/82   Wt Readings from Last 3 Encounters:  06/27/20 113 lb (51.3 kg)  02/13/20 112 lb 3.2 oz (50.9 kg)  12/07/19 113 lb (51.3 kg)   Body mass index is 22.07 kg/m.   Physical Exam    Constitutional: Appears well-developed and well-nourished. No distress.  HENT:  Head: Normocephalic and atraumatic.  Neck: Neck supple. No tracheal deviation present. No thyromegaly present.  No cervical lymphadenopathy Cardiovascular: Normal rate, regular rhythm and normal heart sounds.   No murmur heard. No carotid bruit .  No edema Pulmonary/Chest: Effort normal and breath sounds normal. No respiratory distress. No has no wheezes. No rales.  Skin: Skin is warm and dry. Not diaphoretic.  Psychiatric: Normal mood and affect. Behavior is normal.      Assessment & Plan:     See Problem List for Assessment and Plan of chronic medical problems.    This visit occurred during the SARS-CoV-2 public health emergency.  Safety protocols were in place, including screening questions prior to the visit, additional usage of staff PPE, and extensive cleaning of exam room while observing appropriate contact time as indicated for disinfecting solutions.

## 2020-06-26 NOTE — Patient Instructions (Addendum)
  Blood work was ordered.     Flu immunization administered today.     Medications reviewed and updated.  Changes include :   none  Your prescription(s) have been submitted to your pharmacy. Please take as directed and contact our office if you believe you are having problem(s) with the medication(s).    Please followup in 6 months

## 2020-06-27 ENCOUNTER — Other Ambulatory Visit: Payer: Self-pay

## 2020-06-27 ENCOUNTER — Encounter: Payer: Self-pay | Admitting: Internal Medicine

## 2020-06-27 ENCOUNTER — Ambulatory Visit (INDEPENDENT_AMBULATORY_CARE_PROVIDER_SITE_OTHER): Payer: Medicare Other | Admitting: Internal Medicine

## 2020-06-27 VITALS — BP 144/76 | HR 66 | Temp 98.1°F | Ht 60.0 in | Wt 113.0 lb

## 2020-06-27 DIAGNOSIS — M5030 Other cervical disc degeneration, unspecified cervical region: Secondary | ICD-10-CM

## 2020-06-27 DIAGNOSIS — F411 Generalized anxiety disorder: Secondary | ICD-10-CM | POA: Diagnosis not present

## 2020-06-27 DIAGNOSIS — E785 Hyperlipidemia, unspecified: Secondary | ICD-10-CM | POA: Diagnosis not present

## 2020-06-27 DIAGNOSIS — I10 Essential (primary) hypertension: Secondary | ICD-10-CM

## 2020-06-27 DIAGNOSIS — N390 Urinary tract infection, site not specified: Secondary | ICD-10-CM | POA: Diagnosis not present

## 2020-06-27 DIAGNOSIS — Z23 Encounter for immunization: Secondary | ICD-10-CM

## 2020-06-27 DIAGNOSIS — M810 Age-related osteoporosis without current pathological fracture: Secondary | ICD-10-CM

## 2020-06-27 LAB — COMPREHENSIVE METABOLIC PANEL
ALT: 24 U/L (ref 0–35)
AST: 23 U/L (ref 0–37)
Albumin: 5 g/dL (ref 3.5–5.2)
Alkaline Phosphatase: 55 U/L (ref 39–117)
BUN: 8 mg/dL (ref 6–23)
CO2: 27 mEq/L (ref 19–32)
Calcium: 9.8 mg/dL (ref 8.4–10.5)
Chloride: 97 mEq/L (ref 96–112)
Creatinine, Ser: 0.69 mg/dL (ref 0.40–1.20)
GFR: 89.92 mL/min (ref >60.00)
Glucose, Bld: 93 mg/dL (ref 70–99)
Potassium: 4.1 mEq/L (ref 3.5–5.1)
Sodium: 135 mEq/L (ref 135–145)
Total Bilirubin: 0.8 mg/dL (ref 0.2–1.2)
Total Protein: 6.9 g/dL (ref 6.0–8.3)

## 2020-06-27 LAB — CBC WITH DIFFERENTIAL/PLATELET
Basophils Absolute: 0 10*3/uL (ref 0.0–0.1)
Basophils Relative: 0.5 % (ref 0.0–3.0)
Eosinophils Absolute: 0.1 10*3/uL (ref 0.0–0.7)
Eosinophils Relative: 2.6 % (ref 0.0–5.0)
HCT: 39.2 % (ref 36.0–46.0)
Hemoglobin: 13.5 g/dL (ref 12.0–15.0)
Lymphocytes Relative: 24.3 % (ref 12.0–46.0)
Lymphs Abs: 1 10*3/uL (ref 0.7–4.0)
MCHC: 34.5 g/dL (ref 30.0–36.0)
MCV: 91.9 fl (ref 78.0–100.0)
Monocytes Absolute: 0.4 10*3/uL (ref 0.1–1.0)
Monocytes Relative: 9.5 % (ref 3.0–12.0)
Neutro Abs: 2.6 10*3/uL (ref 1.4–7.7)
Neutrophils Relative %: 63.1 % (ref 43.0–77.0)
Platelets: 227 10*3/uL (ref 150.0–400.0)
RBC: 4.26 Mil/uL (ref 3.87–5.11)
RDW: 13 % (ref 11.5–15.5)
WBC: 4.2 10*3/uL (ref 4.0–10.5)

## 2020-06-27 LAB — LIPID PANEL
Cholesterol: 213 mg/dL — ABNORMAL HIGH (ref 0–200)
HDL: 106.4 mg/dL (ref >39.00)
LDL Cholesterol: 95 mg/dL (ref 0–99)
NonHDL: 106.67
Total CHOL/HDL Ratio: 2
Triglycerides: 59 mg/dL (ref 0.0–149.0)
VLDL: 11.8 mg/dL (ref 0.0–40.0)

## 2020-06-27 LAB — VITAMIN D 25 HYDROXY (VIT D DEFICIENCY, FRACTURES): VITD: 34.45 ng/mL (ref 30.00–100.00)

## 2020-06-27 MED ORDER — AMLODIPINE BESYLATE 10 MG PO TABS: 10.0000 mg | ORAL_TABLET | Freq: Every day | ORAL | 1 refills | Status: DC

## 2020-06-27 NOTE — Assessment & Plan Note (Signed)
Chronic dexa due - will get done at gyn's office Has not been taking calcium - having some stomach issues with it - will try a different brand walking

## 2020-06-27 NOTE — Assessment & Plan Note (Signed)
Chronic Severe DDD and spondylosis With some posterior headaches, no radiculopathy Taking ibuprofen, tylenol - trying ot keep this at a minimum Methocarbamol 500 mg as needed If worsens can retry gabapentin Has seen Dr Tamala Julian

## 2020-06-27 NOTE — Assessment & Plan Note (Signed)
Chronic Controlled, stable Continue xanax 0.25 mg daily prn

## 2020-06-27 NOTE — Assessment & Plan Note (Signed)
Chronic Check lipid panel  Continue pravastatin 40 mg daily Regular exercise and healthy diet encouraged  

## 2020-06-27 NOTE — Addendum Note (Signed)
Addended by: Boris Lown B on: 06/27/2020 09:11 AM   Modules accepted: Orders

## 2020-06-27 NOTE — Assessment & Plan Note (Signed)
Chronic No uti symptoms Continue nitrofurantoin daily

## 2020-06-27 NOTE — Assessment & Plan Note (Signed)
Chronic BP controlled Continue amlodipine 10 mg dialy, hctz 12.5 mg daily cmp

## 2020-06-27 NOTE — Addendum Note (Signed)
Addended by: Marcina Millard on: 06/27/2020 09:33 AM   Modules accepted: Orders

## 2020-06-28 ENCOUNTER — Other Ambulatory Visit: Payer: Self-pay | Admitting: Internal Medicine

## 2020-07-13 ENCOUNTER — Other Ambulatory Visit: Payer: Self-pay | Admitting: Internal Medicine

## 2020-07-25 ENCOUNTER — Encounter: Payer: Self-pay | Admitting: Internal Medicine

## 2020-07-25 MED ORDER — NITROFURANTOIN MONOHYD MACRO 100 MG PO CAPS
100.0000 mg | ORAL_CAPSULE | Freq: Every day | ORAL | 1 refills | Status: DC
Start: 2020-07-25 — End: 2020-11-15

## 2020-08-14 ENCOUNTER — Other Ambulatory Visit: Payer: Self-pay

## 2020-08-14 ENCOUNTER — Telehealth: Payer: Self-pay | Admitting: Pharmacist

## 2020-08-14 DIAGNOSIS — I10 Essential (primary) hypertension: Secondary | ICD-10-CM

## 2020-08-14 NOTE — Progress Notes (Signed)
Chronic Care Management Pharmacy Assistant   Name: [name]  MRN: [id number] DOB: 11-Sep-1950  Reason for Encounter: Initial Questions   PCP : [name], MD  Allergies:   Allergies  Allergen Reactions   Fosamax [Alendronate Sodium]     GI intolerance remotely   Hydrocodone-Acetaminophen     REACTION: itching   Oxycodone-Acetaminophen     REACTION: itching   Tizanidine Other (See Comments)    Made her feel like she could not swallow    Medications: Outpatient Encounter Medications as of 08/14/2020  Medication Sig   ALPRAZolam (XANAX) 0.25 MG tablet TAKE 1 TABLET(0.25 MG) BY MOUTH TWICE DAILY AS NEEDED FOR ANXIETY   amLODipine (NORVASC) 10 MG tablet Take 1 tablet (10 mg total) by mouth daily.   calcium-vitamin D (OSCAL WITH D) 500-200 MG-UNIT tablet Take 1 tablet by mouth daily with breakfast.   cholecalciferol (VITAMIN D3) 25 MCG (1000 UT) tablet Take 1,000 Units by mouth daily.   diclofenac (VOLTAREN) 75 MG EC tablet Take 1 tablet (75 mg total) by mouth 2 (two) times daily as needed for mild pain.   hydrochlorothiazide (HYDRODIURIL) 12.5 MG tablet TAKE 1 TABLET BY MOUTH EVERY DAY   Ibuprofen-diphenhydrAMINE HCl (IBUPROFEN PM) 200-25 MG CAPS Take 1 tablet by mouth at bedtime as needed (pain/sleep).   methocarbamol (ROBAXIN) 500 MG tablet TAKE 1 TABLET(500 MG) BY MOUTH TWICE DAILY AS NEEDED FOR MUSCLE SPASMS   nitrofurantoin, macrocrystal-monohydrate, (MACROBID) 100 MG capsule Take 1 capsule (100 mg total) by mouth daily.   pravastatin (PRAVACHOL) 40 MG tablet TAKE 1 TABLET BY MOUTH DAILY   pseudoephedrine (SUDAFED) 30 MG tablet Take 30 mg by mouth every 4 (four) hours as needed for congestion.   vitamin B-12 (CYANOCOBALAMIN) 1000 MCG tablet Take 1 tablet (1,000 mcg total) by mouth daily.   No facility-administered encounter medications on file as of 08/14/2020.    Current Diagnosis: Patient Active Problem List   Diagnosis Date Noted   Fall  02/13/2020   Left arm pain 02/13/2020   Abdominal pain 12/07/2019   Hyperglycemia 06/07/2019   DDD (degenerative disc disease), cervical 02/02/2019   Musculoskeletal neck pain 10/14/2018   Change in bowel function 08/11/2018   Lower abdominal pain 08/11/2018   Diverticulitis 08/11/2018   Shoulder pain, left 02/23/2017   B12 deficiency 08/07/2016   Recurrent UTI 11/08/2015   Non-celiac gluten sensitivity    Anxiety state 01/17/2010   ALLERGIC RHINITIS 01/17/2010   Dyslipidemia 07/12/2009   Essential hypertension 07/12/2009   Osteoporosis 06/22/2008   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 The patient has not seen any other providers  Any changes in your medications or health? There has been no changes in medications or health  Any side effects from any medications? No side affects form medications  Do you have an symptoms or problems not managed by your medications? The patient has no known symptoms not managed by medication  Any concerns about your health right now? The patient states that she is doing well, has some neck issues but has managed them for years  Has your provider asked that you check blood pressure, blood sugar, or follow special diet at home? The patient states that she should be taking but is not, not on any special  diet.  Do you get any type of exercise on a regular basis? The patient does walk occasionally  Can you think of a goal you would like to reach for your health? The patient would like to walk more.  Do you have any problems getting your medications? The patient states that once in a while she does have issues with getting medications but believes it is because they are so busy  Is there anything that you would like to discuss  during the appointment? The patient states that there are no issues she would like to discuss at this time.  Please bring medications and supplements to appointment

## 2020-08-15 ENCOUNTER — Other Ambulatory Visit: Payer: Self-pay

## 2020-08-15 ENCOUNTER — Ambulatory Visit: Payer: Medicare Other | Admitting: Pharmacist

## 2020-08-15 DIAGNOSIS — I10 Essential (primary) hypertension: Secondary | ICD-10-CM

## 2020-08-15 DIAGNOSIS — M810 Age-related osteoporosis without current pathological fracture: Secondary | ICD-10-CM

## 2020-08-15 DIAGNOSIS — E785 Hyperlipidemia, unspecified: Secondary | ICD-10-CM

## 2020-08-15 DIAGNOSIS — M5030 Other cervical disc degeneration, unspecified cervical region: Secondary | ICD-10-CM

## 2020-08-15 NOTE — Chronic Care Management (AMB) (Signed)
Chronic Care Management Pharmacy  Name: [name]  MRN: [id number] DOB: 1950/12/01   Chief Complaint/ HPI  [name],  69 y.o. , female presents for her Initial CCM visit with the clinical pharmacist via telephone due to COVID-19 Pandemic.  PCP : [name], MD Patient Care Team: [name], MD as PCP - General (Internal Medicine) Thurman Coyer, DO as Consulting Physician (Sports Medicine) Maisie Fus, MD (Obstetrics and Gynecology) Kristeen Miss, MD (Neurosurgery) Hillary Bow, MD (Cardiology) Gatha Mayer, MD (Gastroenterology) Charlton Haws, [facility] as Pharmacist (Pharmacist)  Patient's chronic conditions include: Hypertension, Hyperlipidemia, Anxiety, Osteoporosis and Allergic Rhinitis, chronic pain   Moved to [place] from [place] due to high cost of living. 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 She enjoys gardening, sewing, walking, visiting with friends/family. She was caring for an elderly aunt before she passed recently.   Office Visits: 06/27/20 Dr Quay Burow OV: chronic f/u, labs stable. DEXA due - GYN office. Try different brand of calcium (stomach issues).  02/13/20 Dr Quay Burow OV: acute visit for arm pain after fall. Continue PRN advil, heating pad.  Consult Visit: n/a  Allergies  Allergen Reactions  . Fosamax [Alendronate Sodium]     GI intolerance remotely  . Hydrocodone-Acetaminophen     REACTION: itching  . Oxycodone-Acetaminophen     REACTION: itching  . Tizanidine Other (See Comments)    Made her feel like she could not swallow    Medications: Outpatient Encounter Medications as of 08/15/2020  Medication Sig  . acetaminophen (TYLENOL) 500 MG tablet Take 1,000 mg by mouth every 8 (eight) hours as needed.  . ALPRAZolam (XANAX) 0.25 MG tablet TAKE 1 TABLET(0.25 MG) BY MOUTH TWICE DAILY AS NEEDED FOR ANXIETY  . amLODipine (NORVASC) 10 MG tablet Take 1 tablet (10  mg total) by mouth daily.  . cetirizine (ZYRTEC) 10 MG tablet Take 10 mg by mouth daily.  . cholecalciferol (VITAMIN D3) 25 MCG (1000 UT) tablet Take 1,000 Units by mouth 2 (two) times daily.  . hydrochlorothiazide (HYDRODIURIL) 12.5 MG tablet TAKE 1 TABLET BY MOUTH EVERY DAY  . ibuprofen (ADVIL) 200 MG tablet Take 200 mg by mouth every 8 (eight) hours as needed.  . Ibuprofen-diphenhydrAMINE HCl 200-25 MG CAPS Take 1 tablet by mouth at bedtime as needed (pain/sleep).  . methocarbamol (ROBAXIN) 500 MG tablet TAKE 1 TABLET(500 MG) BY MOUTH TWICE DAILY AS NEEDED FOR MUSCLE SPASMS  . nitrofurantoin, macrocrystal-monohydrate, (MACROBID) 100 MG capsule Take 1 capsule (100 mg total) by mouth daily.  . phenylephrine (SUDAFED PE) 10 MG TABS tablet Take 10 mg by mouth every 4 (four) hours as needed.  . pravastatin (PRAVACHOL) 40 MG tablet TAKE 1 TABLET BY MOUTH DAILY  . vitamin B-12 (CYANOCOBALAMIN) 1000 MCG tablet Take 1 tablet (1,000 mcg total) by mouth daily.  . calcium citrate (CALCITRATE - DOSED IN MG ELEMENTAL CALCIUM) 950 (200 Ca) MG tablet Take 200 mg of elemental calcium by mouth 2 (two) times daily. (Patient not taking: Reported on 08/15/2020)  . [DISCONTINUED] calcium-vitamin D (OSCAL WITH D) 500-200 MG-UNIT tablet Take 1 tablet by mouth daily with breakfast.  . [DISCONTINUED] diclofenac (VOLTAREN) 75 MG EC tablet Take 1 tablet (75 mg total) by mouth 2 (two) times daily as needed for mild pain. (Patient not taking: Reported on 08/15/2020)  . [DISCONTINUED] pseudoephedrine (SUDAFED) 30 MG tablet Take 30 mg by mouth every 4 (four) hours as needed  for congestion.   No facility-administered encounter medications on file as of 08/15/2020.    Wt Readings from Last 3 Encounters:  06/27/20 113 lb (51.3 kg)  02/13/20 112 lb 3.2 oz (50.9 kg)  12/07/19 113 lb (51.3 kg)   Lab Results  Component Value Date   CREATININE 0.69 06/27/2020   BUN 8 06/27/2020   GFR 89.92 06/27/2020   GFRNONAA >60 08/14/2018    GFRAA >60 08/14/2018   NA 135 06/27/2020   K 4.1 06/27/2020   CALCIUM 9.8 06/27/2020   CO2 27 06/27/2020    Current Diagnosis/Assessment:  SDOH Interventions   Flowsheet Row Most Recent Value  SDOH Interventions   Financial Strain Interventions Intervention Not Indicated      Goals Addressed            This Visit's Progress   . Pharmacy Care Plan       CARE PLAN ENTRY (see longitudinal plan of care for additional care plan information)  Current Barriers:  . Chronic Disease Management support, education, and care coordination needs related to Hypertension, Hyperlipidemia, and Osteoporosis, Degenerative disc disease   Hypertension BP Readings from Last 3 Encounters:  06/27/20 (!) 144/76  02/13/20 (!) 150/90  12/07/19 (!) 154/82 .  Pharmacist Clinical Goal(s): o Over the next 60 days, patient will work with PharmD and providers to achieve BP goal <130/80 . Current regimen:  . Amlodipine 10 mg daily AM . HCTZ 12.5 mg daily AM . Interventions: o Discussed BP goals and benefits of medications for prevention of heart attack / stroke o Advised to check BP daily for several weeks and follow up with pharmacist . Patient self care activities - Over the next 60 days, patient will: o Check BP daily, document, and provide at future appointments o Ensure daily salt intake < 2300 mg/day  Hyperlipidemia Lab Results  Component Value Date/Time   LDLCALC 95 06/27/2020 09:11 AM   LDLCALC 2.2 03/29/2009 12:00 AM   LDLDIRECT 87.0 09/16/2012 08:21 AM .  Pharmacist Clinical Goal(s): o Over the next 60 days, patient will work with PharmD and providers to maintain LDL goal < 100 . Current regimen:  o Pravastatin 40 mg daily . Interventions: o Discussed cholesterol goals and benefits of medications for prevention of heart attack / stroke . Patient self care activities - Over the next 60 days, patient will: o Continue current medication  Osteoporosis . Pharmacist Clinical  Goal(s) o Over the next 60 days, patient will work with PharmD and providers to optimize therapy . Current regimen:  o Vitamin D 1000 IU twice a day . Interventions: o Recommend [phone] units of vitamin D daily.  o Recommend 1200 mg of calcium daily from dietary and supplemental sources . Patient self care activities - Over the next 60 days, patient will: o Try other calcium brands to find one that does not upset stomach o Follow up with gynecologist for repeat DEXA scan  Degenerative disc disease, cervical . Pharmacist Clinical Goal(s) o Over the next 60 days, patient will work with PharmD and providers to optimize therapy . Current regimen:  o Ibuprofen 200 mg - 3 tablets daily o Tylenol 500 mg - 1-2 tablets per week as needed . Interventions: o Discussed risks of chronic ibuprofen use, including kidney damage and high BP o Advised to use Tylenol first up to 3000 mg/day, and use ibuprofen sparingly as needed . Patient self care activities - Over the next 60 days, patient will: o Use Tylenol daily as directed  and ibuprofen sparingly as needed  Medication management . Pharmacist Clinical Goal(s): o Over the next 60 days, patient will work with PharmD and providers to maintain optimal medication adherence . Current pharmacy: [facility] . Interventions o Comprehensive medication review performed. o Continue current medication management strategy . Patient self care activities - Over the next 60 days, patient will: o Focus on medication adherence by fill date o Take medications as prescribed o Report any questions or concerns to PharmD and/or provider(s)  Initial goal documentation       Hypertension   BP goal is:  <130/80  Office blood pressures are  BP Readings from Last 3 Encounters:  06/27/20 (!) 144/76  02/13/20 (!) 150/90  12/07/19 (!) 154/82   Patient checks BP at home infrequently Patient home BP readings are ranging: n/a  Patient has failed these meds in the  past: n/a Patient is currently uncontrolled on the following medications:  . Amlodipine 10 mg daily AM . HCTZ 12.5 mg daily AM  We discussed diet and exercise extensively; BP goals; benefits of medications; benefits of monitoring BP at home  Plan  Continue current medications and control with diet and exercise  Monitor BP at home for several weeks F/U 1 month to check BP  Hyperlipidemia   LDL goal < 100  Last lipids Lab Results  Component Value Date   CHOL 213 (H) 06/27/2020   HDL 106.40 06/27/2020   LDLCALC 95 06/27/2020   LDLDIRECT 87.0 09/16/2012   TRIG 59.0 06/27/2020   CHOLHDL 2 06/27/2020   Hepatic Function Latest Ref Rng & Units 06/27/2020 06/08/2019 08/11/2018  Total Protein 6.0 - 8.3 g/dL 6.9 7.1 7.3  Albumin 3.5 - 5.2 g/dL 5.0 5.1 4.5  AST 0 - 37 U/L $Remo'23 19 23  'IGJJE$ ALT 0 - 35 U/L $Remo'24 18 24  'pxNbI$ Alk Phosphatase 39 - 117 U/L 55 123(H) 68  Total Bilirubin 0.2 - 1.2 mg/dL 0.8 0.9 1.2  Bilirubin, Direct 0.0 - 0.3 mg/dL - - -    The ASCVD Risk score Mikey Bussing DC Jr., et al., 2013) failed to calculate for the following reasons:   The valid HDL cholesterol range is 20 to 100 mg/dL   Patient has failed these meds in past: n/a Patient is currently controlled on the following medications:  . Pravastatin 40 mg daily PM  We discussed:  diet and exercise extensively; Cholesterol goals; benefits of statin for ASCVD risk reduction; pt denies side effects  Plan  Continue current medications and control with diet and exercise  Anxiety   No flowsheet data found.  Patient has failed these meds in past: n/a Patient is currently controlled on the following medications:  . Alprazolam 0.25 mg BID prn  We discussed: Pt takes very infrequently (few times a month); she is low risk for benzo-related complications  Plan  Continue current medications  Osteoporosis   Last DEXA Scan: 03/05/2017  T-Score femoral neck: -2.9  T-Score total hip: n/a  T-Score lumbar spine: -1.1  Last  vitamin D/Calcium Lab Results  Component Value Date   VD25OH 34.45 06/27/2020   CALCIUM 9.8 06/27/2020   Patient is a candidate for pharmacologic treatment due to T-Score < -2.5 in femoral neck  Patient has failed these meds in past: alendronate (GERD), calcium (stomach upset) Patient is currently uncontrolled on the following medications:  [name] Vitamin D 1000 IU daily  We discussed:  Recommend [phone] units of vitamin D daily. Recommend 1200 mg of calcium daily from dietary and supplemental sources.  -  advised to try different brand of Calcium to find one she can tolerate -Has discussed Prolia before, prevoiusly declined to get it -DEXA due (GYN office) - appt in January per patient  Plan  Continue current medications and control with diet and exercise  Chronic pain   Cervical degenerative disc disease  Patient has failed these meds in past: diclofenac Patient is currently controlled on the following medications:  . Ibuprofen 200 mg - 3 tab daily . Tylenol 500 mg - couple times a week . Methocarbamol 500 mg BID prn  We discussed: risks associated with chronic NSAID use; advised to use Tylenol first, ibuprofen sparingly as needed. Pt reports using methocarbamol infrequently a few times per month  Plan  Recommend Tylenol first up to 3000 mg/day May use ibuprofen sparingly as needed  Allergic rhinitis   Patient has failed these meds in past: n/a Patient is currently controlled on the following medications:  . Phenylephrine 10 mg PRN . Cetirizine 10 mg daily  We discussed:  Clarified that patient gets OTC "Sudafed" which contains phenylephrine, not the behind-the-counter Sudafed contained pseudoephedrine. Discussed BP risks associated with frequent use. Pt reports using very sparingly for congestion. She has started daily cetirizine since problems seem allergy-related.  Plan  Continue current medications  Recurrent UTI   Patient has failed these meds in past:  n/a Patient is currently controlled on the following medications:  . Nitrofurantoin 100 mg daily  We discussed:  Patient has taken med for a "long time", she has tried to come off of it before and gets a recurrent UTI each time; she has discussed prophylactic therapy with other providers and all have agreed to keep using it since benefits outweigh risks of recurrent infections  Plan  Continue current medications  Vaccines   Reviewed and discussed patient's vaccination history.    Immunization History  Administered Date(s) Administered  . Fluad Quad(high Dose 65+) 06/08/2019, 06/27/2020  . Influenza Split 06/17/2012  . Influenza, High Dose Seasonal PF 06/06/2018  . Influenza,inj,Quad PF,6+ Mos 07/10/2013, 08/16/2014, 08/07/2016, 08/11/2017  . PFIZER SARS-COV-2 Vaccination 10/02/2019, 10/23/2019, 06/19/2020  . Pneumococcal Conjugate-13 06/08/2019  . Pneumococcal Polysaccharide-23 06/27/2020  . Td 09/08/2003  . Tdap 11/08/2015  . Zoster Recombinat (Shingrix) 06/18/2017, 10/06/2017    Plan  Up to date  Health Maintenance   Lab Results  Component Value Date/Time   VITAMINB12 473 08/11/2017 10:05 AM   VITAMINB12 370 08/07/2016 09:27 AM   Patient is currently controlled on the following medications:  [name] Vitamin B12 1000 mcg daily  We discussed:  Patient is satisfied with current regimen and denies issues  Plan  Continue current medications  Medication Management   Patient's preferred pharmacy is:  [facility] [address] [place] [address] [place] [address] Phone: [phone] Fax: [phone]  Uses pill box? No - prefers bottles Pt endorses 100% compliance  We discussed: Discussed benefits of medication synchronization, packaging and delivery as well as enhanced pharmacist oversight with [employer]. Pt will let pharmacist know if she wants to switch  pharmacies.  Plan  Continue current medication management strategy    Follow up: 2 month phone visit  Charlene Brooke, PharmD, BCACP Clinical Pharmacist [facility] [phone]

## 2020-08-15 NOTE — Patient Instructions (Addendum)
Visit Information  Phone number for Pharmacist: [phone]  Thank you for meeting with me to discuss your medications! I look forward to working with you to achieve your health care goals. Below is a summary of what we talked about during the visit:  Goals Addressed            This Visit's Progress   . Pharmacy Care Plan       CARE PLAN ENTRY (see longitudinal plan of care for additional care plan information)  Current Barriers:  . Chronic Disease Management support, education, and care coordination needs related to Hypertension, Hyperlipidemia, and Osteoporosis, Degenerative disc disease   Hypertension BP Readings from Last 3 Encounters:  06/27/20 (!) 144/76  02/13/20 (!) 150/90  12/07/19 (!) 154/82 .  Pharmacist Clinical Goal(s): o Over the next 60 days, patient will work with PharmD and providers to achieve BP goal <130/80 . Current regimen:  . Amlodipine 10 mg daily AM . HCTZ 12.5 mg daily AM . Interventions: o Discussed BP goals and benefits of medications for prevention of heart attack / stroke o Advised to check BP daily for several weeks and follow up with pharmacist . Patient self care activities - Over the next 60 days, patient will: o Check BP daily, document, and provide at future appointments o Ensure daily salt intake < 2300 mg/day  Hyperlipidemia Lab Results  Component Value Date/Time   LDLCALC 95 06/27/2020 09:11 AM   LDLCALC 2.2 03/29/2009 12:00 AM   LDLDIRECT 87.0 09/16/2012 08:21 AM .  Pharmacist Clinical Goal(s): o Over the next 60 days, patient will work with PharmD and providers to maintain LDL goal < 100 . Current regimen:  o Pravastatin 40 mg daily . Interventions: o Discussed cholesterol goals and benefits of medications for prevention of heart attack / stroke . Patient self care activities - Over the next 60 days, patient will: o Continue current medication  Osteoporosis . Pharmacist Clinical Goal(s) o Over the next 60 days, patient  will work with PharmD and providers to optimize therapy . Current regimen:  o Vitamin D 1000 IU twice a day . Interventions: o Recommend [phone] units of vitamin D daily.  o Recommend 1200 mg of calcium daily from dietary and supplemental sources . Patient self care activities - Over the next 60 days, patient will: o Try other calcium brands to find one that does not upset stomach o Follow up with gynecologist for repeat DEXA scan  Degenerative disc disease, cervical . Pharmacist Clinical Goal(s) o Over the next 60 days, patient will work with PharmD and providers to optimize therapy . Current regimen:  o Ibuprofen 200 mg - 3 tablets daily o Tylenol 500 mg - 1-2 tablets per week as needed . Interventions: o Discussed risks of chronic ibuprofen use, including kidney damage and high BP o Advised to use Tylenol first up to 3000 mg/day, and use ibuprofen sparingly as needed . Patient self care activities - Over the next 60 days, patient will: o Use Tylenol daily as directed and ibuprofen sparingly as needed  Medication management . Pharmacist Clinical Goal(s): o Over the next 60 days, patient will work with PharmD and providers to maintain optimal medication adherence . Current pharmacy: [facility] . Interventions o Comprehensive medication review performed. o Continue current medication management strategy . Patient self care activities - Over the next 60 days, patient will: o Focus on medication adherence by fill date o Take medications as prescribed o Report any questions or concerns to PharmD and/or  provider(s)  Initial goal documentation      Ms. Rosell was given information about Chronic Care Management services today including:  1. CCM service includes personalized support from designated clinical staff supervised by her physician, including individualized plan of care and coordination with other care providers 2. 24/7 contact phone numbers for assistance for urgent and  routine care needs. 3. Standard insurance, coinsurance, copays and deductibles apply for chronic care management only during months in which we provide at least 20 minutes of these services. Most insurances cover these services at 100%, however patients may be responsible for any copay, coinsurance and/or deductible if applicable. This service may help you avoid the need for more expensive face-to-face services. 4. Only one practitioner may furnish and bill the service in a calendar month. 5. The patient may stop CCM services at any time (effective at the end of the month) by phone call to the office staff.  Patient agreed to services and verbal consent obtained.   The patient verbalized understanding of instructions, educational materials, and care plan provided today and agreed to receive a mailed copy of patient instructions, educational materials, and care plan.  Telephone follow up appointment with pharmacy team member scheduled for: 2 months  Charlene Brooke, PharmD, BCACP Clinical Pharmacist [facility] [place] protect organs, store calcium, anchor muscles, and support the whole body. Keeping your bones strong is important, especially as you get older. You can take actions to help keep your bones strong and healthy. Why is keeping my bones healthy important?  Keeping your bones healthy is important because your body constantly replaces bone cells. Cells get old, and new cells take their place. As we age, we lose bone cells because the body may not be able to make enough new cells to replace the old cells. The amount of bone cells and bone tissue you have is referred to as bone mass. The higher your bone mass, the stronger your bones. The aging process leads to an overall loss of bone mass in the body, which can increase the likelihood of:  Joint pain and stiffness.  Broken bones.  A condition in which the bones become weak and  brittle (osteoporosis). A large decline in bone mass occurs in older adults. In women, it occurs about the time of menopause. What actions can I take to keep my bones healthy? Good health habits are important for maintaining healthy bones. This includes eating nutritious foods and exercising regularly. To have healthy bones, you need to get enough of the right minerals and vitamins. Most nutrition experts recommend getting these nutrients from the foods that you eat. In some cases, taking supplements may also be recommended. Doing certain types of exercise is also important for bone health. What are the nutritional recommendations for healthy bones?  Eating a well-balanced diet with plenty of calcium and vitamin D will help to protect your bones. Nutritional recommendations vary from person to person. Ask your health care provider what is healthy for you. Here are some general guidelines. Get enough calcium Calcium is the most important (essential) mineral for bone health. Most people can get enough calcium from their diet, but supplements may be recommended for people who are at risk for osteoporosis. Good sources of calcium include:  Dairy products, such as low-fat or nonfat milk, cheese, and yogurt.  Dark green leafy vegetables, such as bok choy and broccoli.  Calcium-fortified foods, such as orange juice, cereal, bread, soy beverages, and  tofu products.  Nuts, such as almonds. Follow these recommended amounts for daily calcium intake:  Children, age 66-3: 700 mg.  Children, age 28-8: 1,000 mg.  Children, age 666-13: 1,300 mg.  Teens, age 36-18: 1,300 mg.  Adults, age 55-50: 1,000 mg.  Adults, age 67-70: ? Men: 1,000 mg. ? Women: 1,200 mg.  Adults, age 51 or older: 1,200 mg.  Pregnant and breastfeeding females: ? Teens: 1,300 mg. ? Adults: 1,000 mg. Get enough vitamin D Vitamin D is the most essential vitamin for bone health. It helps the body absorb calcium. Sunlight stimulates  the skin to make vitamin D, so be sure to get enough sunlight. If you live in a cold climate or you do not get outside often, your health care provider may recommend that you take vitamin D supplements. Good sources of vitamin D in your diet include:  Egg yolks.  Saltwater fish.  Milk and cereal fortified with vitamin D. Follow these recommended amounts for daily vitamin D intake:  Children and teens, age 66-18: 600 international units.  Adults, age 56 or younger: 400-800 international units.  Adults, age 51 or older: 800-1,000 international units. Get other important nutrients Other nutrients that are important for bone health include:  Phosphorus. This mineral is found in meat, poultry, dairy foods, nuts, and legumes. The recommended daily intake for adult men and adult women is 700 mg.  Magnesium. This mineral is found in seeds, nuts, dark green vegetables, and legumes. The recommended daily intake for adult men is 400-420 mg. For adult women, it is 310-320 mg.  Vitamin K. This vitamin is found in green leafy vegetables. The recommended daily intake is 120 mg for adult men and 90 mg for adult women. What type of physical activity is best for building and maintaining healthy bones? Weight-bearing and strength-building activities are important for building and maintaining healthy bones. Weight-bearing activities cause muscles and bones to work against gravity. Strength-building activities increase the strength of the muscles that support bones. Weight-bearing and muscle-building activities include:  Walking and hiking.  Jogging and running.  Dancing.  Gym exercises.  Lifting weights.  Tennis and racquetball.  Climbing stairs.  Aerobics. Adults should get at least 30 minutes of moderate physical activity on most days. Children should get at least 60 minutes of moderate physical activity on most days. Ask your health care provider what type of exercise is best for you. How can  I find out if my bone mass is low? Bone mass can be measured with an X-ray test called a bone mineral density (BMD) test. This test is recommended for all women who are age 5 or older. It may also be recommended for:  Men who are age 38 or older.  People who are at risk for osteoporosis because of: ? Having bones that break easily. ? Having a long-term disease that weakens bones, such as kidney disease or rheumatoid arthritis. ? Having menopause earlier than normal. ? Taking medicine that weakens bones, such as steroids, thyroid hormones, or hormone treatment for breast cancer or prostate cancer. ? Smoking. ? Drinking three or more alcoholic drinks a day. If you find that you have a low bone mass, you may be able to prevent osteoporosis or further bone loss by changing your diet and lifestyle. Where can I find more information? For more information, check out the following websites:  [place]: AviationTales.fr  [employer] of Health: www.bones.SouthExposed.es  International Osteoporosis Foundation: Administrator.iofbonehealth.org Summary  The aging process leads  to an overall loss of bone mass in the body, which can increase the likelihood of broken bones and osteoporosis.  Eating a well-balanced diet with plenty of calcium and vitamin D will help to protect your bones.  Weight-bearing and strength-building activities are also important for building and maintaining strong bones.  Bone mass can be measured with an X-ray test called a bone mineral density (BMD) test. This information is not intended to replace advice given to you by your health care provider. Make sure you discuss any questions you have with your health care provider. Document Revised: 09/20/2017 Document Reviewed: 09/20/2017 Elsevier Patient Education  2020 [employer].

## 2020-08-23 ENCOUNTER — Other Ambulatory Visit: Payer: Self-pay | Admitting: Family

## 2020-08-26 ENCOUNTER — Encounter: Payer: Self-pay | Admitting: Internal Medicine

## 2020-09-05 ENCOUNTER — Telehealth: Payer: Self-pay | Admitting: Pharmacist

## 2020-09-05 NOTE — Progress Notes (Signed)
    Chronic Care Management Pharmacy Assistant   Name: [name]  MRN: [id number] DOB: Dec 12, 1950  Reason for Encounter: Chart Review   PCP : Pincus Sanes, MD  Allergies:   Allergies  Allergen Reactions  . Fosamax [Alendronate Sodium]     GI intolerance remotely  . Hydrocodone-Acetaminophen     REACTION: itching  . Oxycodone-Acetaminophen     REACTION: itching  . Tizanidine Other (See Comments)    Made her feel like she could not swallow    Medications: Outpatient Encounter Medications as of 09/05/2020  Medication Sig  . methocarbamol (ROBAXIN) 500 MG tablet TAKE 1 TABLET(500 MG) BY MOUTH TWICE DAILY AS NEEDED FOR MUSCLE SPASMS  . acetaminophen (TYLENOL) 500 MG tablet Take 1,000 mg by mouth every 8 (eight) hours as needed.  . ALPRAZolam (XANAX) 0.25 MG tablet TAKE 1 TABLET(0.25 MG) BY MOUTH TWICE DAILY AS NEEDED FOR ANXIETY  . amLODipine (NORVASC) 10 MG tablet Take 1 tablet (10 mg total) by mouth daily.  . calcium citrate (CALCITRATE - DOSED IN MG ELEMENTAL CALCIUM) 950 (200 Ca) MG tablet Take 200 mg of elemental calcium by mouth 2 (two) times daily. (Patient not taking: Reported on 08/15/2020)  . cetirizine (ZYRTEC) 10 MG tablet Take 10 mg by mouth daily.  . cholecalciferol (VITAMIN D3) 25 MCG (1000 UT) tablet Take 1,000 Units by mouth 2 (two) times daily.  . hydrochlorothiazide (HYDRODIURIL) 12.5 MG tablet TAKE 1 TABLET BY MOUTH EVERY DAY  . ibuprofen (ADVIL) 200 MG tablet Take 200 mg by mouth every 8 (eight) hours as needed.  . Ibuprofen-diphenhydrAMINE HCl 200-25 MG CAPS Take 1 tablet by mouth at bedtime as needed (pain/sleep).  . nitrofurantoin, macrocrystal-monohydrate, (MACROBID) 100 MG capsule Take 1 capsule (100 mg total) by mouth daily.  . phenylephrine (SUDAFED PE) 10 MG TABS tablet Take 10 mg by mouth every 4 (four) hours as needed.  . pravastatin (PRAVACHOL) 40 MG tablet TAKE 1 TABLET BY MOUTH DAILY  . vitamin B-12 (CYANOCOBALAMIN) 1000 MCG tablet Take 1  tablet (1,000 mcg total) by mouth daily.   No facility-administered encounter medications on file as of 09/05/2020.    Current Diagnosis: Patient Active Problem List   Diagnosis Date Noted  . Fall 02/13/2020  . Left arm pain 02/13/2020  . Abdominal pain 12/07/2019  . Hyperglycemia 06/07/2019  . DDD (degenerative disc disease), cervical 02/02/2019  . Musculoskeletal neck pain 10/14/2018  . Change in bowel function 08/11/2018  . Lower abdominal pain 08/11/2018  . Diverticulitis 08/11/2018  . Shoulder pain, left 02/23/2017  . B12 deficiency 08/07/2016  . Recurrent UTI 11/08/2015  . Non-celiac gluten sensitivity   . Anxiety state 01/17/2010  . ALLERGIC RHINITIS 01/17/2010  . Dyslipidemia 07/12/2009  . Essential hypertension 07/12/2009  . Osteoporosis 06/22/2008  . 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  . ANEMIA, PERNICIOUS 10/14/2007  . IRRITABLE BOWEL SYNDROME 10/14/2007    Goals Addressed   None     Follow-Up:  Pharmacist Review   Reviewed chart for medication changes and adherence.  No office visits, consults, or hospital visits since last coordination call/Pharmacist No medication changes indicated  No gaps in adherence identified. Patient has follow up scheduled with pharmacy team. No further action required.   Horald Pollen, Clinical Pharmacist Assistant [facility]

## 2020-09-13 ENCOUNTER — Other Ambulatory Visit: Payer: Self-pay | Admitting: Internal Medicine

## 2020-09-18 ENCOUNTER — Other Ambulatory Visit: Payer: Self-pay

## 2020-09-18 ENCOUNTER — Ambulatory Visit: Payer: Medicare Other | Admitting: Pharmacist

## 2020-09-18 DIAGNOSIS — M810 Age-related osteoporosis without current pathological fracture: Secondary | ICD-10-CM

## 2020-09-18 DIAGNOSIS — I10 Essential (primary) hypertension: Secondary | ICD-10-CM

## 2020-09-18 DIAGNOSIS — E785 Hyperlipidemia, unspecified: Secondary | ICD-10-CM

## 2020-09-18 NOTE — Patient Instructions (Addendum)
Visit Information  Phone number for Pharmacist: [phone]  Goals Addressed            This Visit's Progress   . Pharmacy Care Plan       CARE PLAN ENTRY (see longitudinal plan of care for additional care plan information)  Current Barriers:  . Chronic Disease Management support, education, and care coordination needs related to Hypertension, Hyperlipidemia, and Osteoporosis  Hypertension BP Readings from Last 3 Encounters:  06/27/20 (!) 144/76  02/13/20 (!) 150/90  12/07/19 (!) 154/82 .  Pharmacist Clinical Goal(s): o Over the next 60 days, patient will work with PharmD and providers to achieve BP goal <130/80 . Current regimen:  . Amlodipine 10 mg daily AM . HCTZ 12.5 mg daily AM . Interventions: o Discussed BP goals and benefits of medications for prevention of heart attack / stroke o Advised to check BP daily for several weeks and follow up with pharmacist . Patient self care activities - Over the next 60 days, patient will: o Check BP daily, document, and provide at future appointments o Ensure daily salt intake < 2300 mg/day  Hyperlipidemia Lab Results  Component Value Date/Time   LDLCALC 95 06/27/2020 09:11 AM   LDLCALC 2.2 03/29/2009 12:00 AM   LDLDIRECT 87.0 09/16/2012 08:21 AM .  Pharmacist Clinical Goal(s): o Over the next 60 days, patient will work with PharmD and providers to maintain LDL goal < 100 . Current regimen:  o Pravastatin 40 mg daily . Interventions: o Discussed cholesterol goals and benefits of medications for prevention of heart attack / stroke . Patient self care activities - Over the next 60 days, patient will: o Continue current medication  Osteoporosis . Pharmacist Clinical Goal(s) o Over the next 60 days, patient will work with PharmD and providers to optimize therapy . Current regimen:  o Vitamin D 1000 IU twice a day . Interventions: o Recommend [phone] units of vitamin D daily.  o Recommend 1200 mg of calcium daily from  dietary and supplemental sources . Patient self care activities - Over the next 60 days, patient will: o Try other calcium brands to find one that does not upset stomach o Follow up with gynecologist for repeat DEXA scan  Medication management . Pharmacist Clinical Goal(s): o Over the next 60 days, patient will work with PharmD and providers to maintain optimal medication adherence . Current pharmacy: [facility] . Interventions o Comprehensive medication review performed. o Continue current medication management strategy . Patient self care activities - Over the next 60 days, patient will: o Focus on medication adherence by fill date o Take medications as prescribed o Report any questions or concerns to PharmD and/or provider(s)  Please see past updates related to this goal by clicking on the "Past Updates" button in the selected goal       There are no care plans to display for this patient.   The patient verbalized understanding of instructions, educational materials, and care plan provided today and declined offer to receive copy of patient instructions, educational materials, and care plan.  Telephone follow up appointment with pharmacy team member scheduled for: 2 months  Charlene Brooke, PharmD, [facility] Clinical Pharmacist [facility] [phone]

## 2020-09-18 NOTE — Chronic Care Management (AMB) (Signed)
Chronic Care Management Pharmacy  Name: [name]  MRN: [id number] DOB: 1951-03-30   Chief Complaint/ HPI  [name],  70 y.o. , female presents for her Follow-Up CCM visit with the clinical pharmacist via telephone due to COVID-19 Pandemic.  PCP : [name], MD Patient Care Team: [name], MD as PCP - General (Internal Medicine) Thurman Coyer, DO as Consulting Physician (Sports Medicine) Maisie Fus, MD (Obstetrics and Gynecology) Kristeen Miss, MD (Neurosurgery) Hillary Bow, MD (Cardiology) Gatha Mayer, MD (Gastroenterology) Charlton Haws, [facility] as Pharmacist (Pharmacist)  Patient's chronic conditions include: Hypertension, Hyperlipidemia, Anxiety, Osteoporosis and Allergic Rhinitis, chronic pain  Moved to [place] from [place] due to high cost of living. 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 She enjoys gardening, sewing, walking, visiting with friends/family. She was caring for an elderly aunt before she passed recently.   Office Visits: 06/27/20 Dr Quay Burow OV: chronic f/u, labs stable. DEXA due - GYN office. Try different brand of calcium (stomach issues).  02/13/20 Dr Quay Burow OV: acute visit for arm pain after fall. Continue PRN advil, heating pad.  Consult Visit: n/a  Allergies  Allergen Reactions  . Fosamax [Alendronate Sodium]     GI intolerance remotely  . Hydrocodone-Acetaminophen     REACTION: itching  . Oxycodone-Acetaminophen     REACTION: itching  . Tizanidine Other (See Comments)    Made her feel like she could not swallow    Medications: Outpatient Encounter Medications as of 09/18/2020  Medication Sig  . acetaminophen (TYLENOL) 500 MG tablet Take 1,000 mg by mouth every 8 (eight) hours as needed.  . ALPRAZolam (XANAX) 0.25 MG tablet TAKE 1 TABLET(0.25 MG) BY MOUTH TWICE DAILY AS NEEDED FOR ANXIETY  . amLODipine (NORVASC) 10 MG tablet Take 1 tablet (10  mg total) by mouth daily.  . calcium citrate (CALCITRATE - DOSED IN MG ELEMENTAL CALCIUM) 950 (200 Ca) MG tablet Take 200 mg of elemental calcium by mouth 2 (two) times daily.  . cetirizine (ZYRTEC) 10 MG tablet Take 10 mg by mouth daily.  . cholecalciferol (VITAMIN D3) 25 MCG (1000 UT) tablet Take 1,000 Units by mouth 2 (two) times daily.  . hydrochlorothiazide (HYDRODIURIL) 12.5 MG tablet TAKE 1 TABLET BY MOUTH EVERY DAY  . ibuprofen (ADVIL) 200 MG tablet Take 200 mg by mouth every 8 (eight) hours as needed.  . Ibuprofen-diphenhydrAMINE HCl 200-25 MG CAPS Take 1 tablet by mouth at bedtime as needed (pain/sleep).  . methocarbamol (ROBAXIN) 500 MG tablet TAKE 1 TABLET(500 MG) BY MOUTH TWICE DAILY AS NEEDED FOR MUSCLE SPASMS  . nitrofurantoin, macrocrystal-monohydrate, (MACROBID) 100 MG capsule Take 1 capsule (100 mg total) by mouth daily.  . phenylephrine (SUDAFED PE) 10 MG TABS tablet Take 10 mg by mouth every 4 (four) hours as needed.  . pravastatin (PRAVACHOL) 40 MG tablet TAKE 1 TABLET BY MOUTH DAILY  . vitamin B-12 (CYANOCOBALAMIN) 1000 MCG tablet Take 1 tablet (1,000 mcg total) by mouth daily.   No facility-administered encounter medications on file as of 09/18/2020.    Wt Readings from Last 3 Encounters:  06/27/20 113 lb (51.3 kg)  02/13/20 112 lb 3.2 oz (50.9 kg)  12/07/19 113 lb (51.3 kg)   Lab Results  Component Value Date   CREATININE 0.69 06/27/2020   BUN 8 06/27/2020   GFR 89.92 06/27/2020   GFRNONAA >60 08/14/2018   GFRAA >60 08/14/2018   NA 135  06/27/2020   K 4.1 06/27/2020   CALCIUM 9.8 06/27/2020   CO2 27 06/27/2020    Current Diagnosis/Assessment:    Goals Addressed            This Visit's Progress   . Pharmacy Care Plan       CARE PLAN ENTRY (see longitudinal plan of care for additional care plan information)  Current Barriers:  . Chronic Disease Management support, education, and care coordination needs related to Hypertension, Hyperlipidemia, and  Osteoporosis  Hypertension BP Readings from Last 3 Encounters:  06/27/20 (!) 144/76  02/13/20 (!) 150/90  12/07/19 (!) 154/82 .  Pharmacist Clinical Goal(s): o Over the next 60 days, patient will work with PharmD and providers to achieve BP goal <130/80 . Current regimen:  . Amlodipine 10 mg daily AM . HCTZ 12.5 mg daily AM . Interventions: o Discussed BP goals and benefits of medications for prevention of heart attack / stroke o Advised to check BP daily for several weeks and follow up with pharmacist . Patient self care activities - Over the next 60 days, patient will: o Check BP daily, document, and provide at future appointments o Ensure daily salt intake < 2300 mg/day  Hyperlipidemia Lab Results  Component Value Date/Time   LDLCALC 95 06/27/2020 09:11 AM   LDLCALC 2.2 03/29/2009 12:00 AM   LDLDIRECT 87.0 09/16/2012 08:21 AM .  Pharmacist Clinical Goal(s): o Over the next 60 days, patient will work with PharmD and providers to maintain LDL goal < 100 . Current regimen:  o Pravastatin 40 mg daily . Interventions: o Discussed cholesterol goals and benefits of medications for prevention of heart attack / stroke . Patient self care activities - Over the next 60 days, patient will: o Continue current medication  Osteoporosis . Pharmacist Clinical Goal(s) o Over the next 60 days, patient will work with PharmD and providers to optimize therapy . Current regimen:  o Vitamin D 1000 IU twice a day . Interventions: o Recommend [phone] units of vitamin D daily.  o Recommend 1200 mg of calcium daily from dietary and supplemental sources . Patient self care activities - Over the next 60 days, patient will: o Try other calcium brands to find one that does not upset stomach o Follow up with gynecologist for repeat DEXA scan  Medication management . Pharmacist Clinical Goal(s): o Over the next 60 days, patient will work with PharmD and providers to maintain optimal medication  adherence . Current pharmacy: [facility] . Interventions o Comprehensive medication review performed. o Continue current medication management strategy . Patient self care activities - Over the next 60 days, patient will: o Focus on medication adherence by fill date o Take medications as prescribed o Report any questions or concerns to PharmD and/or provider(s)  Please see past updates related to this goal by clicking on the "Past Updates" button in the selected goal        Hypertension   BP goal is:  <130/80  Office blood pressures are  BP Readings from Last 3 Encounters:  06/27/20 (!) 144/76  02/13/20 (!) 150/90  12/07/19 (!) 154/82   Patient checks BP at home infrequently Patient home BP readings are ranging: n/a  Patient has failed these meds in the past: n/a Patient is currently uncontrolled on the following medications:  . Amlodipine 10 mg daily AM . HCTZ 12.5 mg daily AM  We discussed diet and exercise extensively; BP goals; benefits of medications; benefits of monitoring BP at home - pt has  not started monitoring yet to assess efficacy of meds, she reports she will start this week.  Plan  Continue current medications and control with diet and exercise  Monitor BP at home for several weeks F/U 1 month to check BP   Hyperlipidemia   LDL goal < 100  Last lipids Lab Results  Component Value Date   CHOL 213 (H) 06/27/2020   HDL 106.40 06/27/2020   LDLCALC 95 06/27/2020   LDLDIRECT 87.0 09/16/2012   TRIG 59.0 06/27/2020   CHOLHDL 2 06/27/2020   Hepatic Function Latest Ref Rng & Units 06/27/2020 06/08/2019 08/11/2018  Total Protein 6.0 - 8.3 g/dL 6.9 7.1 7.3  Albumin 3.5 - 5.2 g/dL 5.0 5.1 4.5  AST 0 - 37 U/L $Remo'23 19 23  'laHSc$ ALT 0 - 35 U/L $Remo'24 18 24  'RBjtT$ Alk Phosphatase 39 - 117 U/L 55 123(H) 68  Total Bilirubin 0.2 - 1.2 mg/dL 0.8 0.9 1.2  Bilirubin, Direct 0.0 - 0.3 mg/dL - - -    The ASCVD Risk score (Russell., et al., 2013) failed to calculate for the  following reasons:   The valid HDL cholesterol range is 20 to 100 mg/dL   Patient has failed these meds in past: n/a Patient is currently controlled on the following medications:  . Pravastatin 40 mg daily PM  We discussed:  diet and exercise extensively; Cholesterol goals; benefits of statin for ASCVD risk reduction; pt denies side effects  Plan  Continue current medications and control with diet and exercise  Osteoporosis   Last DEXA Scan: 03/05/2017  T-Score femoral neck: -2.9  T-Score total hip: n/a  T-Score lumbar spine: -1.1  Last vitamin D/Calcium Lab Results  Component Value Date   VD25OH 34.45 06/27/2020   CALCIUM 9.8 06/27/2020   Patient is a candidate for pharmacologic treatment due to T-Score < -2.5 in femoral neck  Patient has failed these meds in past: alendronate (GERD), calcium (stomach upset) Patient is currently uncontrolled on the following medications:  [name] Vitamin D 1000 IU daily  We discussed:  Recommend [phone] units of vitamin D daily. Recommend 1200 mg of calcium daily from dietary and supplemental sources.  -advised to try different brand of Calcium to find one she can tolerate -Has discussed Prolia before, prevoiusly declined to get it -DEXA due (GYN office) - appt in January per patient  Plan  Continue current medications and control with diet and exercise   Medication Management   Patient's preferred pharmacy is:  [facility] [address], [address] [place] [address] Phone: [phone] Fax: [phone]  Uses pill box? No - prefers bottles Pt endorses 100% compliance  We discussed: Discussed benefits of medication synchronization, packaging and delivery as well as enhanced pharmacist oversight with [facility]. Pt will let pharmacist know if she wants to switch pharmacies.  Plan  Continue current medication management strategy    Follow  up: 1 month phone visit  Charlene Brooke, PharmD, [facility] Clinical Pharmacist [facility] [phone]

## 2020-10-02 ENCOUNTER — Encounter: Payer: Self-pay | Admitting: Internal Medicine

## 2020-10-02 DIAGNOSIS — Z1231 Encounter for screening mammogram for malignant neoplasm of breast: Secondary | ICD-10-CM | POA: Diagnosis not present

## 2020-10-02 DIAGNOSIS — Z01419 Encounter for gynecological examination (general) (routine) without abnormal findings: Secondary | ICD-10-CM | POA: Diagnosis not present

## 2020-10-02 DIAGNOSIS — Z124 Encounter for screening for malignant neoplasm of cervix: Secondary | ICD-10-CM | POA: Diagnosis not present

## 2020-10-02 DIAGNOSIS — Z6824 Body mass index (BMI) 24.0-24.9, adult: Secondary | ICD-10-CM | POA: Diagnosis not present

## 2020-10-02 DIAGNOSIS — N958 Other specified menopausal and perimenopausal disorders: Secondary | ICD-10-CM | POA: Diagnosis not present

## 2020-10-02 DIAGNOSIS — M816 Localized osteoporosis [Lequesne]: Secondary | ICD-10-CM | POA: Diagnosis not present

## 2020-10-02 LAB — HM DEXA SCAN

## 2020-10-04 LAB — HM MAMMOGRAPHY

## 2020-10-07 ENCOUNTER — Telehealth: Payer: Self-pay | Admitting: Pharmacist

## 2020-10-07 NOTE — Progress Notes (Signed)
° ° °  Chronic Care Management Pharmacy Assistant   Name: [name]  MRN: [id number] DOB: 08/04/1951  Reason for Encounter: Chart Review   PCP : [name], MD  Allergies:   Allergies  Allergen Reactions   Fosamax [Alendronate Sodium]     GI intolerance remotely   Hydrocodone-Acetaminophen     REACTION: itching   Oxycodone-Acetaminophen     REACTION: itching   Tizanidine Other (See Comments)    Made her feel like she could not swallow    Medications: Outpatient Encounter Medications as of 10/07/2020  Medication Sig   acetaminophen (TYLENOL) 500 MG tablet Take 1,000 mg by mouth every 8 (eight) hours as needed.   ALPRAZolam (XANAX) 0.25 MG tablet TAKE 1 TABLET(0.25 MG) BY MOUTH TWICE DAILY AS NEEDED FOR ANXIETY   amLODipine (NORVASC) 10 MG tablet Take 1 tablet (10 mg total) by mouth daily.   calcium citrate (CALCITRATE - DOSED IN MG ELEMENTAL CALCIUM) 950 (200 Ca) MG tablet Take 200 mg of elemental calcium by mouth 2 (two) times daily.   cetirizine (ZYRTEC) 10 MG tablet Take 10 mg by mouth daily.   cholecalciferol (VITAMIN D3) 25 MCG (1000 UT) tablet Take 1,000 Units by mouth 2 (two) times daily.   hydrochlorothiazide (HYDRODIURIL) 12.5 MG tablet TAKE 1 TABLET BY MOUTH EVERY DAY   ibuprofen (ADVIL) 200 MG tablet Take 200 mg by mouth every 8 (eight) hours as needed.   Ibuprofen-diphenhydrAMINE HCl 200-25 MG CAPS Take 1 tablet by mouth at bedtime as needed (pain/sleep).   methocarbamol (ROBAXIN) 500 MG tablet TAKE 1 TABLET(500 MG) BY MOUTH TWICE DAILY AS NEEDED FOR MUSCLE SPASMS   nitrofurantoin, macrocrystal-monohydrate, (MACROBID) 100 MG capsule Take 1 capsule (100 mg total) by mouth daily.   phenylephrine (SUDAFED PE) 10 MG TABS tablet Take 10 mg by mouth every 4 (four) hours as needed.   pravastatin (PRAVACHOL) 40 MG tablet TAKE 1 TABLET BY MOUTH DAILY   vitamin B-12 (CYANOCOBALAMIN) 1000 MCG tablet Take 1 tablet (1,000 mcg total) by mouth daily.   No  facility-administered encounter medications on file as of 10/07/2020.    Current Diagnosis: Patient Active Problem List   Diagnosis Date Noted   Fall 02/13/2020   Left arm pain 02/13/2020   Abdominal pain 12/07/2019   Hyperglycemia 06/07/2019   DDD (degenerative disc disease), cervical 02/02/2019   Musculoskeletal neck pain 10/14/2018   Change in bowel function 08/11/2018   Lower abdominal pain 08/11/2018   Diverticulitis 08/11/2018   Shoulder pain, left 02/23/2017   B12 deficiency 08/07/2016   Recurrent UTI 11/08/2015   Non-celiac gluten sensitivity    Anxiety state 01/17/2010   ALLERGIC RHINITIS 01/17/2010   Dyslipidemia 07/12/2009   Essential hypertension 07/12/2009   Osteoporosis 06/22/2008   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 There were no medication changes during this visit. Patient is not yet on goal with blood pressure. Patient is to monitor blood pressure for several weeks.   No gaps in adherence identified. Patient has follow up scheduled with pharmacy team. No further action required.   Wendy Poet, [place] [phone]

## 2020-10-13 ENCOUNTER — Other Ambulatory Visit: Payer: Self-pay | Admitting: Internal Medicine

## 2020-10-15 LAB — HM PAP SMEAR: HM Pap smear: NORMAL

## 2020-11-06 ENCOUNTER — Other Ambulatory Visit: Payer: Self-pay

## 2020-11-06 ENCOUNTER — Ambulatory Visit (INDEPENDENT_AMBULATORY_CARE_PROVIDER_SITE_OTHER): Payer: Medicare Other

## 2020-11-06 VITALS — BP 138/80 | HR 71 | Temp 97.8°F | Ht 60.0 in | Wt 114.0 lb

## 2020-11-06 DIAGNOSIS — Z0000 Encounter for general adult medical examination without abnormal findings: Secondary | ICD-10-CM | POA: Diagnosis not present

## 2020-11-06 NOTE — Patient Instructions (Addendum)
[name] , Thank you for taking time to come for your Medicare Wellness Visit. I appreciate your ongoing commitment to your health goals. Please review the following plan we discussed and let me know if I can assist you in the future.   Screening recommendations/referrals: Colonoscopy: 05/23/2015; due every 10 years  Mammogram: 10/02/2020; due every 1-2 years Bone Density: 10/02/2020; due every 2-3 years Recommended yearly ophthalmology/optometry visit for glaucoma screening and checkup Recommended yearly dental visit for hygiene and checkup  Vaccinations: Influenza vaccine: 06/27/2020 Pneumococcal vaccine: 06/08/2019, 06/27/2020 Tdap vaccine: 11/08/2015; due every 10 years Shingles vaccine: 06/18/2017, 10/06/2017   Covid-19: 10/02/2019, 10/23/2019, 06/19/2020  Advanced directives: Please bring a copy of your health care power of attorney and living will to the office at your convenience.  Conditions/risks identified: Yes; Reviewed health maintenance screenings with patient today and relevant education, vaccines, and/or referrals were provided. Please continue to do your personal lifestyle choices by: daily care of teeth and gums, regular physical activity (goal should be 5 days a week for 30 minutes), eat a healthy diet, avoid tobacco and drug use, limiting any alcohol intake, taking a low-dose aspirin (if not allergic or have been advised by your provider otherwise) and taking vitamins and minerals as recommended by your provider. Continue doing brain stimulating activities (puzzles, reading, adult coloring books, staying active) to keep memory sharp. Continue to eat heart healthy diet (full of fruits, vegetables, whole grains, lean protein, water--limit salt, fat, and sugar intake) and increase physical activity as tolerated.  Next appointment: Please schedule your next Medicare Wellness Visit with your Nurse Health Advisor in 1 year by calling [phone].  Preventive Care 33 Years and Older,  Female Preventive care refers to lifestyle choices and visits with your health care provider that can promote health and wellness. What does preventive care include?  A yearly physical exam. This is also called an annual well check.  Dental exams once or twice a year.  Routine eye exams. Ask your health care provider how often you should have your eyes checked.  Personal lifestyle choices, including:  Daily care of your teeth and gums.  Regular physical activity.  Eating a healthy diet.  Avoiding tobacco and drug use.  Limiting alcohol use.  Practicing safe sex.  Taking low-dose aspirin every day.  Taking vitamin and mineral supplements as recommended by your health care provider. What happens during an annual well check? The services and screenings done by your health care provider during your annual well check will depend on your age, overall health, lifestyle risk factors, and family history of disease. Counseling  Your health care provider may ask you questions about your:  Alcohol use.  Tobacco use.  Drug use.  Emotional well-being.  Home and relationship well-being.  Sexual activity.  Eating habits.  History of falls.  Memory and ability to understand (cognition).  Work and work Statistician.  Reproductive health. Screening  You may have the following tests or measurements:  Height, weight, and BMI.  Blood pressure.  Lipid and cholesterol levels. These may be checked every 5 years, or more frequently if you are over 51 years old.  Skin check.  Lung cancer screening. You may have this screening every year starting at age 2 if you have a 30-pack-year history of smoking and currently smoke or have quit within the past 15 years.  Fecal occult blood test (FOBT) of the stool. You may have this test every year starting at age 25.  Flexible sigmoidoscopy or colonoscopy.  You may have a sigmoidoscopy every 5 years or a colonoscopy every 10 years  starting at age 61.  Hepatitis C blood test.  Hepatitis B blood test.  Sexually transmitted disease (STD) testing.  Diabetes screening. This is done by checking your blood sugar (glucose) after you have not eaten for a while (fasting). You may have this done every 1-3 years.  Bone density scan. This is done to screen for osteoporosis. You may have this done starting at age 77.  Mammogram. This may be done every 1-2 years. Talk to your health care provider about how often you should have regular mammograms. Talk with your health care provider about your test results, treatment options, and if necessary, the need for more tests. Vaccines  Your health care provider may recommend certain vaccines, such as:  Influenza vaccine. This is recommended every year.  Tetanus, diphtheria, and acellular pertussis (Tdap, Td) vaccine. You may need a Td booster every 10 years.  Zoster vaccine. You may need this after age 73.  Pneumococcal 13-valent conjugate (PCV13) vaccine. One dose is recommended after age 62.  Pneumococcal polysaccharide (PPSV23) vaccine. One dose is recommended after age 63. Talk to your health care provider about which screenings and vaccines you need and how often you need them. This information is not intended to replace advice given to you by your health care provider. Make sure you discuss any questions you have with your health care provider. Document Released: 09/20/2015 Document Revised: 05/13/2016 Document Reviewed: 06/25/2015 Elsevier Interactive Patient Education  2017 [place] Prevention in the Home Falls can cause injuries. They can happen to people of all ages. There are many things you can do to make your home safe and to help prevent falls. What can I do on the outside of my home?  Regularly fix the edges of walkways and driveways and fix any cracks.  Remove anything that might make you trip as you walk through a door, such as a raised step or  threshold.  Trim any bushes or trees on the path to your home.  Use bright outdoor lighting.  Clear any walking paths of anything that might make someone trip, such as rocks or tools.  Regularly check to see if handrails are loose or broken. Make sure that both sides of any steps have handrails.  Any raised decks and porches should have guardrails on the edges.  Have any leaves, snow, or ice cleared regularly.  Use sand or salt on walking paths during winter.  Clean up any spills in your garage right away. This includes oil or grease spills. What can I do in the bathroom?  Use night lights.  Install grab bars by the toilet and in the tub and shower. Do not use towel bars as grab bars.  Use non-skid mats or decals in the tub or shower.  If you need to sit down in the shower, use a plastic, non-slip stool.  Keep the floor dry. Clean up any water that spills on the floor as soon as it happens.  Remove soap buildup in the tub or shower regularly.  Attach bath mats securely with double-sided non-slip rug tape.  Do not have throw rugs and other things on the floor that can make you trip. What can I do in the bedroom?  Use night lights.  Make sure that you have a light by your bed that is easy to reach.  Do not use any sheets or blankets that are too big  for your bed. They should not hang down onto the floor.  Have a firm chair that has side arms. You can use this for support while you get dressed.  Do not have throw rugs and other things on the floor that can make you trip. What can I do in the kitchen?  Clean up any spills right away.  Avoid walking on wet floors.  Keep items that you use a lot in easy-to-reach places.  If you need to reach something above you, use a strong step stool that has a grab bar.  Keep electrical cords out of the way.  Do not use floor polish or wax that makes floors slippery. If you must use wax, use non-skid floor wax.  Do not have  throw rugs and other things on the floor that can make you trip. What can I do with my stairs?  Do not leave any items on the stairs.  Make sure that there are handrails on both sides of the stairs and use them. Fix handrails that are broken or loose. Make sure that handrails are as long as the stairways.  Check any carpeting to make sure that it is firmly attached to the stairs. Fix any carpet that is loose or worn.  Avoid having throw rugs at the top or bottom of the stairs. If you do have throw rugs, attach them to the floor with carpet tape.  Make sure that you have a light switch at the top of the stairs and the bottom of the stairs. If you do not have them, ask someone to add them for you. What else can I do to help prevent falls?  Wear shoes that:  Do not have high heels.  Have rubber bottoms.  Are comfortable and fit you well.  Are closed at the toe. Do not wear sandals.  If you use a stepladder:  Make sure that it is fully opened. Do not climb a closed stepladder.  Make sure that both sides of the stepladder are locked into place.  Ask someone to hold it for you, if possible.  Clearly mark and make sure that you can see:  Any grab bars or handrails.  First and last steps.  Where the edge of each step is.  Use tools that help you move around (mobility aids) if they are needed. These include:  Canes.  Walkers.  Scooters.  Crutches.  Turn on the lights when you go into a dark area. Replace any light bulbs as soon as they burn out.  Set up your furniture so you have a clear path. Avoid moving your furniture around.  If any of your floors are uneven, fix them.  If there are any pets around you, be aware of where they are.  Review your medicines with your doctor. Some medicines can make you feel dizzy. This can increase your chance of falling. Ask your doctor what other things that you can do to help prevent falls. This information is not intended to  replace advice given to you by your health care provider. Make sure you discuss any questions you have with your health care provider. Document Released: 06/20/2009 Document Revised: 01/30/2016 Document Reviewed: 09/28/2014 Elsevier Interactive Patient Education  2017 [employer].

## 2020-11-06 NOTE — Progress Notes (Signed)
Subjective:   [name] is a 70 y.o. female who presents for Medicare Annual (Subsequent) preventive examination.  Review of Systems    No ROS. Medicare Wellness Visit. Additional risk factors are reflected in social history. Cardiac Risk Factors include: advanced age (>58men, >92 women);hypertension;family history of premature cardiovascular disease;dyslipidemia     Objective:    Today's Vitals   11/06/20 1107 11/06/20 1151  BP: (!) 150/80 138/80  Pulse: 71   Temp: 97.8 F (36.6 C)   SpO2: 99%   Weight: 114 lb (51.7 kg)   Height: 5' (1.524 m)   PainSc: 0-No pain    Body mass index is 22.26 kg/m.  Advanced Directives 11/06/2020 06/08/2019 08/11/2018 05/09/2015 05/19/2012 05/18/2012  Does Patient Have a Medical Advance Directive? Yes Yes No No Patient does not have advance directive;Patient would not like information Patient does not have advance directive;Patient would not like information  Type of Advance Directive Living will;Healthcare Power of [place];Living will - - - -  Does patient want to make changes to medical advance directive? No - Patient declined - - - - -  Copy of [place] in Chart? No - copy requested No - copy requested - - - -  Would patient like information on creating a medical advance directive? - - No - Patient declined No - patient declined information - -  Pre-existing out of facility DNR order (yellow form or pink MOST form) - - - - - No    Current Medications (verified) Outpatient Encounter Medications as of 11/06/2020  Medication Sig  . acetaminophen (TYLENOL) 500 MG tablet Take 1,000 mg by mouth every 8 (eight) hours as needed.  . ALPRAZolam (XANAX) 0.25 MG tablet TAKE 1 TABLET(0.25 MG) BY MOUTH TWICE DAILY AS NEEDED FOR ANXIETY  . amLODipine (NORVASC) 10 MG tablet Take 1 tablet (10 mg total) by mouth daily.  . cetirizine (ZYRTEC) 10 MG tablet Take 10 mg by mouth daily.  . cholecalciferol (VITAMIN  D3) 25 MCG (1000 UT) tablet Take 1,000 Units by mouth 2 (two) times daily.  . hydrochlorothiazide (HYDRODIURIL) 12.5 MG tablet TAKE 1 TABLET BY MOUTH EVERY DAY  . ibuprofen (ADVIL) 200 MG tablet Take 200 mg by mouth every 8 (eight) hours as needed.  . methocarbamol (ROBAXIN) 500 MG tablet TAKE 1 TABLET(500 MG) BY MOUTH TWICE DAILY AS NEEDED FOR MUSCLE SPASMS  . nitrofurantoin, macrocrystal-monohydrate, (MACROBID) 100 MG capsule Take 1 capsule (100 mg total) by mouth daily.  . phenylephrine (SUDAFED PE) 10 MG TABS tablet Take 10 mg by mouth every 4 (four) hours as needed.  . pravastatin (PRAVACHOL) 40 MG tablet TAKE 1 TABLET BY MOUTH DAILY  . vitamin B-12 (CYANOCOBALAMIN) 1000 MCG tablet Take 1 tablet (1,000 mcg total) by mouth daily.  . calcium citrate (CALCITRATE - DOSED IN MG ELEMENTAL CALCIUM) 950 (200 Ca) MG tablet Take 200 mg of elemental calcium by mouth 2 (two) times daily. (Patient not taking: Reported on 11/06/2020)  . Ibuprofen-diphenhydrAMINE HCl 200-25 MG CAPS Take 1 tablet by mouth at bedtime as needed (pain/sleep). (Patient not taking: Reported on 11/06/2020)   No facility-administered encounter medications on file as of 11/06/2020.    Allergies (verified) Fosamax [alendronate sodium], Hydrocodone-acetaminophen, Oxycodone-acetaminophen, and Tizanidine   History: Past Medical History:  Diagnosis Date  . ALLERGIC RHINITIS   . ANEMIA, PERNICIOUS   . ANXIETY   . BILIARY DYSKINESIA   . CARPAL TUNNEL SYNDROME, LEFT   . DYSLIPIDEMIA   .  Headache(784.0)   . HYPERTENSION   . Irritable bowel syndrome   . Non-celiac gluten sensitivity   . OSTEOPENIA   . 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 APPENDECTOMY    . CARPAL TUNNEL RELEASE    . CESAREAN SECTION  76 & 85   x's 2   . CHOLECYSTECTOMY  2005   Dr. Dalbert Batman (Gallbladder dyskinesia)  . COLONOSCOPY    . DILATION AND CURETTAGE OF UTERUS    . LEFT HEART  CATHETERIZATION WITH CORONARY ANGIOGRAM N/A 05/19/2012   Procedure: LEFT HEART CATHETERIZATION WITH CORONARY ANGIOGRAM;  Surgeon: Hillary Bow, MD;  Location: [facility] CATH LAB;  Service: Cardiovascular;  Laterality: N/A;  . ROTATOR CUFF REPAIR    . TONSILLECTOMY    . UPPER GASTROINTESTINAL ENDOSCOPY     Family History  Problem Relation Age of Onset  . Colitis Mother   . Heart disease Mother   . Hyperlipidemia Mother   . Lung cancer Mother        Cause of death  . Hypertension Mother   . Heart disease Brother   . Valvular heart disease Brother   . Heart attack Brother   . Atrial fibrillation Brother   . Heart failure Brother   . Miscarriages / [place] Brother   . Lung cancer Father   . Leukemia Brother   . Coronary artery disease Brother        MI in late 66's. Still living  . Ovarian cancer Maternal Aunt   . Diabetes Maternal Aunt   . Coronary artery disease Brother        MI in 66's. Also still living.  . Colon cancer Neg Hx   . Colon polyps Neg Hx    Social History   Socioeconomic History  . Marital status: Married    Spouse name: Not on file  . Number of children: Not on file  . Years of education: Not on file  . Highest education level: Not on file  Occupational History  . 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 Smoking status: Never Smoker  . Smokeless tobacco: Never Used  Vaping Use  . Vaping Use: Never used  Substance and Sexual Activity  . Alcohol use: Yes    Alcohol/week: 1.0 standard drink    Types: 1 Glasses of wine per week    Comment: occassional  . Drug use: No  . Sexual activity: Yes    Birth control/protection: Post-menopausal  Other Topics Concern  . Not on file  Social History Narrative   Married and lives with husband.    Copywriter, advertising - retired   Scientist, physiological Strain: [place]   . Difficulty of Paying Living Expenses: Not hard at all  Food Insecurity:  No Food Insecurity  . Worried About Charity fundraiser in the Last Year: Never true  . Ran Out of Food in the Last Year: Never true  Transportation Needs: No Transportation Needs  . Lack of Transportation (Medical): No  . Lack of Transportation (Non-Medical): No  Physical Activity: Sufficiently Active  . Days of Exercise per Week: 5 days  . Minutes of Exercise per Session: 30 min  Stress: No Stress Concern Present  . Feeling of Stress : Not at all  Social Connections: Socially Integrated  . Frequency of Communication with Friends and Family: More than three  times a week  . Frequency of Social Gatherings with Friends and Family: Once a week  . Attends Religious Services: More than 4 times per year  . Active Member of Clubs or Organizations: Yes  . Attends Archivist Meetings: More than 4 times per year  . Marital Status: Married    Tobacco Counseling Counseling given: Not Answered   Clinical Intake:  Pre-visit preparation completed: Yes  Pain : No/denies pain Pain Score: 0-No pain     BMI - recorded: 22.26 Nutritional Status: BMI of 19-24  Normal Nutritional Risks: None Diabetes: No  How often do you need to have someone help you when you read instructions, pamphlets, or other written materials from your doctor or pharmacy?: 1 - Never What is the last grade level you completed in school?: Bachelor's Degree  Diabetic? no  Interpreter Needed?: No  Information entered by :: Lisette Abu, LPN   Activities of Daily Living In your present state of health, do you have any difficulty performing the following activities: 11/06/2020 06/27/2020  Hearing? N N  Vision? N N  Difficulty concentrating or making decisions? N N  Walking or climbing stairs? N N  Dressing or bathing? N N  Doing errands, shopping? N N  Preparing Food and eating ? N -  Using the Toilet? N -  In the past six months, have you accidently leaked urine? N -  Do you have problems with loss  of bowel control? N -  Managing your Medications? N -  Managing your Finances? N -  Housekeeping or managing your Housekeeping? N -  Some recent data might be hidden    Patient Care Team: [name], MD as PCP - General (Internal Medicine) Thurman Coyer, DO as Consulting Physician (Sports Medicine) Maisie Fus, MD (Obstetrics and Gynecology) Kristeen Miss, MD (Neurosurgery) Hillary Bow, MD (Cardiology) Gatha Mayer, MD (Gastroenterology) Charlton Haws, [facility] as Pharmacist (Pharmacist)  Indicate any recent Medical Services you may have received from other than [facility] providers in the past year (date may be approximate).     Assessment:   This is a routine wellness examination for Ryah.  Hearing/Vision screen No exam data present  Dietary issues and exercise activities discussed: Current Exercise Habits: Home exercise routine, Type of exercise: walking (3-6 miles per day), Time (Minutes): 30, Frequency (Times/Week): 5, Weekly Exercise (Minutes/Week): 150, Intensity: Moderate, Exercise limited by: None identified  Goals    . Patient Stated     Maintain current health status.    . Pharmacy Care Plan     CARE PLAN ENTRY (see longitudinal plan of care for additional care plan information)  Current Barriers:  . Chronic Disease Management support, education, and care coordination needs related to Hypertension, Hyperlipidemia, and Osteoporosis  Hypertension BP Readings from Last 3 Encounters:  06/27/20 (!) 144/76  02/13/20 (!) 150/90  12/07/19 (!) 154/82 .  Pharmacist Clinical Goal(s): o Over the next 60 days, patient will work with PharmD and providers to achieve BP goal <130/80 . Current regimen:  . Amlodipine 10 mg daily AM . HCTZ 12.5 mg daily AM . Interventions: o Discussed BP goals and benefits of medications for prevention of heart attack / stroke o Advised to check BP daily for several weeks and follow up with pharmacist . Patient self care  activities - Over the next 60 days, patient will: o Check BP daily, document, and provide at future appointments o Ensure daily salt intake < 2300 mg/day  Hyperlipidemia Lab Results  Component Value Date/Time   LDLCALC 95 06/27/2020 09:11 AM   LDLCALC 2.2 03/29/2009 12:00 AM   LDLDIRECT 87.0 09/16/2012 08:21 AM .  Pharmacist Clinical Goal(s): o Over the next 60 days, patient will work with PharmD and providers to maintain LDL goal < 100 . Current regimen:  o Pravastatin 40 mg daily . Interventions: o Discussed cholesterol goals and benefits of medications for prevention of heart attack / stroke . Patient self care activities - Over the next 60 days, patient will: o Continue current medication  Osteoporosis . Pharmacist Clinical Goal(s) o Over the next 60 days, patient will work with PharmD and providers to optimize therapy . Current regimen:  o Vitamin D 1000 IU twice a day . Interventions: o Recommend [phone] units of vitamin D daily.  o Recommend 1200 mg of calcium daily from dietary and supplemental sources . Patient self care activities - Over the next 60 days, patient will: o Try other calcium brands to find one that does not upset stomach o Follow up with gynecologist for repeat DEXA scan  Medication management . Pharmacist Clinical Goal(s): o Over the next 60 days, patient will work with PharmD and providers to maintain optimal medication adherence . Current pharmacy: [facility] . Interventions o Comprehensive medication review performed. o Continue current medication management strategy . Patient self care activities - Over the next 60 days, patient will: o Focus on medication adherence by fill date o Take medications as prescribed o Report any questions or concerns to PharmD and/or provider(s)  Please see past updates related to this goal by clicking on the "Past Updates" button in the selected goal       Depression Screen PHQ 2/9 Scores 11/06/2020 06/27/2020  06/08/2019 08/11/2017  PHQ - 2 Score 0 0 1 0  PHQ- 9 Score - - 2 -    Fall Risk Fall Risk  11/06/2020 06/27/2020 06/08/2019 08/11/2017  Falls in the past year? 0 1 0 No  Number falls in past yr: 0 0 0 -  Injury with Fall? 0 0 1 -  Risk for fall due to : No Fall Risks No Fall Risks - -  Follow up Falls evaluation completed Falls evaluation completed - -    FALL RISK PREVENTION PERTAINING TO THE HOME:  Any stairs in or around the home? Yes  If so, are there any without handrails? No  Home free of loose throw rugs in walkways, pet beds, electrical cords, etc? Yes  Adequate lighting in your home to reduce risk of falls? Yes   ASSISTIVE DEVICES UTILIZED TO PREVENT FALLS:  Life alert? No  Use of a cane, walker or w/c? No  Grab bars in the bathroom? Yes  Shower chair or bench in shower? No  Elevated toilet seat or a handicapped toilet? Yes   TIMED UP AND GO:  Was the test performed? No .  Length of time to ambulate 10 feet: 0 sec.   Gait steady and fast without use of assistive device  Cognitive Function:  Normal cognitive status assessed by direct observation by this Nurse Health Advisor. No abnormalities found.          Immunizations Immunization History  Administered Date(s) Administered  . Fluad Quad(high Dose 65+) 06/08/2019, 06/27/2020  . Influenza Split 06/17/2012  . Influenza, High Dose Seasonal PF 06/06/2018  . Influenza,inj,Quad PF,6+ Mos 07/10/2013, 08/16/2014, 08/07/2016, 08/11/2017  . PFIZER(Purple Top)SARS-COV-2 Vaccination 10/02/2019, 10/23/2019, 06/19/2020  . Pneumococcal Conjugate-13 06/08/2019  . Pneumococcal Polysaccharide-23  06/27/2020  . Td 09/08/2003  . Tdap 11/08/2015  . Zoster Recombinat (Shingrix) 06/18/2017, 10/06/2017    TDAP status: Up to date  Flu Vaccine status: Up to date  Pneumococcal vaccine status: Up to date  Covid-19 vaccine status: Completed vaccines  Qualifies for Shingles Vaccine? Yes   Zostavax completed Yes   Shingrix  Completed?: Yes  Screening Tests Health Maintenance  Topic Date Due  . COVID-19 Vaccine (4 - Booster for Pfizer series) 12/18/2020  . MAMMOGRAM  10/02/2022  . DEXA SCAN  10/02/2022  . COLONOSCOPY (Pts 45-44yrs Insurance coverage will need to be confirmed)  05/22/2025  . TETANUS/TDAP  11/07/2025  . INFLUENZA VACCINE  Completed  . Hepatitis C Screening  Completed  . PNA vac Low Risk Adult  Completed  . HPV VACCINES  Aged Out    Health Maintenance  There are no preventive care reminders to display for this patient.  Colorectal cancer screening: Type of screening: Colonoscopy. Completed 05/23/2015. Repeat every 10 years  Mammogram status: Completed 10/02/2020. Repeat every year  Bone Density status: Completed 10/02/2020. Results reflect: Bone density results: OSTEOPOROSIS. Repeat every 2 years.  Lung Cancer Screening: (Low Dose CT Chest recommended if Age 65-80 years, 30 pack-year currently smoking OR have quit w/in 15years.) does not qualify.   Lung Cancer Screening Referral: no  Additional Screening:  Hepatitis C Screening: does qualify; Completed yes  Vision Screening: Recommended annual ophthalmology exams for early detection of glaucoma and other disorders of the eye. Is the patient up to date with their annual eye exam?  Yes   Who is the provider or what is the name of the office in which the patient attends annual eye exams? [facility] If pt is not established with a provider, would they like to be referred to a provider to establish care? No .   Dental Screening: Recommended annual dental exams for proper oral hygiene  Community Resource Referral / Chronic Care Management: CRR required this visit?  No   CCM required this visit?  No      Plan:     I have personally reviewed and noted the following in the patient's chart:   . Medical and social history . Use of alcohol, tobacco or illicit drugs  . Current medications and  supplements . Functional ability and status . Nutritional status . Physical activity . Advanced directives . List of other physicians . Hospitalizations, surgeries, and ER visits in previous 12 months . Vitals . Screenings to include cognitive, depression, and falls . Referrals and appointments  In addition, I have reviewed and discussed with patient certain preventive protocols, quality metrics, and best practice recommendations. A written personalized care plan for preventive services as well as general preventive health recommendations were provided to patient.     Sheral Flow, LPN   04/13/7671   Nurse Notes:  Medications reviewed with patient; no opioid use noted.

## 2020-11-14 ENCOUNTER — Encounter: Payer: Self-pay | Admitting: Internal Medicine

## 2020-11-14 ENCOUNTER — Ambulatory Visit (INDEPENDENT_AMBULATORY_CARE_PROVIDER_SITE_OTHER): Payer: Medicare Other | Admitting: Pharmacist

## 2020-11-14 ENCOUNTER — Other Ambulatory Visit: Payer: Self-pay

## 2020-11-14 DIAGNOSIS — E785 Hyperlipidemia, unspecified: Secondary | ICD-10-CM | POA: Diagnosis not present

## 2020-11-14 DIAGNOSIS — I10 Essential (primary) hypertension: Secondary | ICD-10-CM

## 2020-11-14 DIAGNOSIS — M810 Age-related osteoporosis without current pathological fracture: Secondary | ICD-10-CM | POA: Diagnosis not present

## 2020-11-14 NOTE — Progress Notes (Signed)
Outside notes received. Information abstracted. Notes sent to scan.  

## 2020-11-14 NOTE — Patient Instructions (Signed)
Visit Information  Phone number for Pharmacist: [phone]  Goals Addressed   None    Patient Care Plan: CCM Pharmacy Care Plan    Problem Identified: Hypertension, Hyperlipidemia and Osteoporosis   Priority: High    Long-Range Goal: Disease management   Start Date: 11/14/2020  Expected End Date: 05/17/2021  This Visit's Progress: On track  Priority: High  Note:   Current Barriers:  . Unable to independently monitor therapeutic efficacy  Pharmacist Clinical Goal(s):  [name] Over the next 180 days, patient will achieve adherence to monitoring guidelines and medication adherence to achieve therapeutic efficacy through collaboration with PharmD and provider.   Interventions: . 1:1 collaboration with [name], MD regarding development and update of comprehensive plan of care as evidenced by provider attestation and co-signature . Inter-disciplinary care team collaboration (see longitudinal plan of care) . Comprehensive medication review performed; medication list updated in electronic medical record    Hypertension    BP goal is:  <140/90  Patient checks BP at home: weekly Patient home BP readings are ranging: SBP 133-150   Patient has failed these meds in the past: n/a Patient is currently CONTROLLED on the following medications:   Amlodipine 10 mg daily AM  HCTZ 12.5 mg daily AM   We discussed diet and exercise extensively; BP goals; benefits of medications; benefits of monitoring BP at home;  Plan   Continue current medications and control with diet and exercise  Monitor BP at home weekly   Hyperlipidemia    LDL goal < 100 Patient has failed these meds in past: n/a Patient is currently controlled on the following medications:   Pravastatin 40 mg daily PM   We discussed:  diet and exercise extensively; Cholesterol goals; benefits of statin for ASCVD risk reduction; pt denies side effects   Plan   Continue current medications and control with diet and  exercise   Osteoporosis    Last DEXA Scan: 03/05/2017             T-Score femoral neck: -2.9             T-Score total hip: n/a             T-Score lumbar spine: -1.1   Patient is a candidate for pharmacologic treatment due to T-Score < -2.5 in femoral neck Patient has failed these meds in past: alendronate (GERD), calcium (stomach upset) Patient is currently uncontrolled on the following medications:   Vitamin D 1000 IU daily   We discussed:  Recommend [phone] units of vitamin D daily. Recommend 1200 mg of calcium daily from dietary and supplemental sources.  -advised to try different brand of Calcium to find one she can tolerate -Has discussed Prolia before, prevoiusly declined to get it -pt reports she had DEXA scan done Jan 2022 at OB/GYN office, was told slight worsening of BMD but no changes recommended. She will bring results to next PCP visit in April.   Plan   Continue current medications and control with diet and exercise  Patient Goals/Self-Care Activities . Over the next 180 days, patient will:  - take medications as prescribed focus on medication adherence by pill box check blood pressure weekly, document, and provide at future appointments  Follow Up Plan: Telephone follow up appointment with care management team member scheduled for: 6 months      Patient verbalizes understanding of instructions provided today and agrees to view in [place].  Telephone follow up appointment with pharmacy team member scheduled for: 6  months  Charlene Brooke, PharmD, BCACP Clinical Pharmacist [facility] [phone]

## 2020-11-14 NOTE — Progress Notes (Signed)
Chronic Care Management Pharmacy Note  11/14/2020 Name:  SHAMARRA WARDA MRN:  [id number] DOB:  10/15/1950  Subjective: [name] is an 70 y.o. year old female who is a primary patient of Burns, Claudina Lick, MD.  The CCM team was consulted for assistance with disease management and care coordination needs.    Engaged with patient by telephone for follow up visit in response to provider referral for pharmacy case management and/or care coordination services.   Consent to Services:  The patient was given information about Chronic Care Management services, agreed to services, and gave verbal consent prior to initiation of services.  Please see initial visit note for detailed documentation.   Patient Care Team: [name], MD as PCP - General (Internal Medicine) Thurman Coyer, DO as Consulting Physician (Sports Medicine) Maisie Fus, MD (Obstetrics and Gynecology) Kristeen Miss, MD (Neurosurgery) Hillary Bow, MD (Cardiology) Gatha Mayer, MD (Gastroenterology) Charlton Haws, [facility] as Pharmacist (Pharmacist)  Recent office visits: 06/27/20 Dr Quay Burow OV: chronic f/u, labs stable. DEXA due - GYN office. Try different brand of calcium (stomach issues).  02/13/20 Dr Quay Burow OV: acute visit for arm pain after fall. Continue PRN advil, heating pad.   Recent consult visits: [facility] visits: None in previous 6 months  Objective:  Lab Results  Component Value Date   CREATININE 0.69 06/27/2020   BUN 8 06/27/2020   GFR 89.92 06/27/2020   GFRNONAA >60 08/14/2018   GFRAA >60 08/14/2018   NA 135 06/27/2020   K 4.1 06/27/2020   CALCIUM 9.8 06/27/2020   CO2 27 06/27/2020    Lab Results  Component Value Date/Time   HGBA1C 5.4 06/08/2019 09:47 AM   HGBA1C 5.2 08/11/2017 10:05 AM   GFR 89.92 06/27/2020 09:11 AM   GFR 92.79 06/08/2019 09:47 AM    Last diabetic Eye exam: No results found for: HMDIABEYEEXA  Last diabetic Foot exam: No results found for:  HMDIABFOOTEX   Lab Results  Component Value Date   CHOL 213 (H) 06/27/2020   HDL 106.40 06/27/2020   LDLCALC 95 06/27/2020   LDLDIRECT 87.0 09/16/2012   TRIG 59.0 06/27/2020   CHOLHDL 2 06/27/2020    Hepatic Function Latest Ref Rng & Units 06/27/2020 06/08/2019 08/11/2018  Total Protein 6.0 - 8.3 g/dL 6.9 7.1 7.3  Albumin 3.5 - 5.2 g/dL 5.0 5.1 4.5  AST 0 - 37 U/L $Remo'23 19 23  'wyfbe$ ALT 0 - 35 U/L $Remo'24 18 24  'drcxb$ Alk Phosphatase 39 - 117 U/L 55 123(H) 68  Total Bilirubin 0.2 - 1.2 mg/dL 0.8 0.9 1.2  Bilirubin, Direct 0.0 - 0.3 mg/dL - - -    Lab Results  Component Value Date/Time   TSH 1.67 06/08/2019 09:47 AM   TSH 2.30 06/06/2018 02:40 PM    CBC Latest Ref Rng & Units 06/27/2020 06/08/2019 08/14/2018  WBC 4.0 - 10.5 K/uL 4.2 5.8 5.1  Hemoglobin 12.0 - 15.0 g/dL 13.5 13.6 11.2(L)  Hematocrit 36.0 - 46.0 % 39.2 39.1 34.1(L)  Platelets 150.0 - 400.0 K/uL 227.0 248.0 219    Lab Results  Component Value Date/Time   VD25OH 34.45 06/27/2020 09:11 AM   VD25OH 32.89 08/07/2016 09:27 AM    Clinical ASCVD: No  The ASCVD Risk score Mikey Bussing DC Jr., et al., 2013) failed to calculate for the following reasons:   The valid HDL cholesterol range is 20 to 100 mg/dL    Depression screen [facility] 2/9 11/06/2020 06/27/2020 06/08/2019  Decreased  Interest 0 0 0  Down, Depressed, Hopeless 0 0 1  PHQ - 2 Score 0 0 1  Altered sleeping - - 1  Tired, decreased energy - - 0  Change in appetite - - 0  Feeling bad or failure about yourself  - - 0  Trouble concentrating - - 0  Moving slowly or fidgety/restless - - 0  Suicidal thoughts - - 0  PHQ-9 Score - - 2  Difficult doing work/chores - - Not difficult at all     Social History   Tobacco Use  Smoking Status Never Smoker  Smokeless Tobacco Never Used   BP Readings from Last 3 Encounters:  11/06/20 138/80  06/27/20 (!) 144/76  02/13/20 (!) 150/90   Pulse Readings from Last 3 Encounters:  11/06/20 71  06/27/20 66  02/13/20 82   Wt Readings from Last  3 Encounters:  11/06/20 114 lb (51.7 kg)  06/27/20 113 lb (51.3 kg)  02/13/20 112 lb 3.2 oz (50.9 kg)    Assessment/Interventions: Review of patient past medical history, allergies, medications, health status, including review of consultants reports, laboratory and other test data, was performed as part of comprehensive evaluation and provision of chronic care management services.   SDOH:  (Social Determinants of Health) assessments and interventions performed: Yes   SDOH Screenings   Alcohol Screen: Low Risk   . Last Alcohol Screening Score (AUDIT): 4  Depression (PHQ2-9): Low Risk   . PHQ-2 Score: 0  Financial Resource Strain: Low Risk   . Difficulty of Paying Living Expenses: Not hard at all  Food Insecurity: No Food Insecurity  . Worried About Charity fundraiser in the Last Year: Never true  . Ran Out of Food in the Last Year: Never true  Housing: Low Risk   . Last Housing Risk Score: 0  Physical Activity: Sufficiently Active  . Days of Exercise per Week: 5 days  . Minutes of Exercise per Session: 30 min  Social Connections: Socially Integrated  . Frequency of Communication with Friends and Family: More than three times a week  . Frequency of Social Gatherings with Friends and Family: Once a week  . Attends Religious Services: More than 4 times per year  . Active Member of Clubs or Organizations: Yes  . Attends Archivist Meetings: More than 4 times per year  . Marital Status: Married  Stress: No Stress Concern Present  . Feeling of Stress : Not at all  Tobacco Use: Low Risk   . Smoking Tobacco Use: Never Smoker  . Smokeless Tobacco Use: Never Used  Transportation Needs: No Transportation Needs  . Lack of Transportation (Medical): No  . Lack of Transportation (Non-Medical): No   CCM Care Plan  Allergies  Allergen Reactions  . Fosamax [Alendronate Sodium]     GI intolerance remotely  . Hydrocodone-Acetaminophen     REACTION: itching  .  Oxycodone-Acetaminophen     REACTION: itching  . Tizanidine Other (See Comments)    Made her feel like she could not swallow    Medications Reviewed Today    Reviewed by Charlton Haws, [facility] (Pharmacist) on 11/14/20 at [place] List Status: <None>  Medication Order Taking? Sig Documenting Provider Last Dose Status Informant  acetaminophen (TYLENOL) 500 MG tablet [id number] Yes Take 1,000 mg by mouth every 8 (eight) hours as needed. [provider] Taking Active   ALPRAZolam Duanne Moron) 0.25 MG tablet [id number] Yes TAKE 1 TABLET(0.25 MG) BY MOUTH TWICE DAILY AS NEEDED  FOR ANXIETY Burns, Claudina Lick, MD Taking Active   amLODipine (NORVASC) 10 MG tablet [id number] Yes Take 1 tablet (10 mg total) by mouth daily. [name], MD Taking Active   calcium citrate (CALCITRATE - DOSED IN MG ELEMENTAL CALCIUM) 950 (200 Ca) MG tablet [id number] Yes Take 200 mg of elemental calcium by mouth 2 (two) times daily. [provider] Taking Active   cetirizine (ZYRTEC) 10 MG tablet [id number] Yes Take 10 mg by mouth daily. [provider] Taking Active   cholecalciferol (VITAMIN D3) 25 MCG (1000 UT) tablet [id number] Yes Take 1,000 Units by mouth 2 (two) times daily. [provider] Taking Active Self  hydrochlorothiazide (HYDRODIURIL) 12.5 MG tablet [id number] Yes TAKE 1 TABLET BY MOUTH EVERY DAY Burns, Claudina Lick, MD Taking Active   ibuprofen (ADVIL) 200 MG tablet [id number] Yes Take 200 mg by mouth every 8 (eight) hours as needed. [provider] Taking Active   Ibuprofen-diphenhydrAMINE HCl 200-25 MG CAPS [id number] Yes Take 1 tablet by mouth at bedtime as needed (pain/sleep). [provider] Taking Active   methocarbamol (ROBAXIN) 500 MG tablet [id number] Yes TAKE 1 TABLET(500 MG) BY MOUTH TWICE DAILY AS NEEDED FOR MUSCLE SPASMS Burns, Claudina Lick, MD Taking Active   nitrofurantoin, macrocrystal-monohydrate, (MACROBID) 100 MG capsule [id number] Yes Take 1 capsule (100 mg  total) by mouth daily. [name], MD Taking Active   phenylephrine (SUDAFED PE) 10 MG TABS tablet [id number] Yes Take 10 mg by mouth every 4 (four) hours as needed. [provider] Taking Active   pravastatin (PRAVACHOL) 40 MG tablet [id number] Yes TAKE 1 TABLET BY MOUTH DAILY Burns, Claudina Lick, MD Taking Active   vitamin B-12 (CYANOCOBALAMIN) 1000 MCG tablet [id number] Yes Take 1 tablet (1,000 mcg total) by mouth daily. [name], MD Taking Active Self          Patient Active Problem List   Diagnosis Date Noted  . Fall 02/13/2020  . Left arm pain 02/13/2020  . Abdominal pain 12/07/2019  . Hyperglycemia 06/07/2019  . DDD (degenerative disc disease), cervical 02/02/2019  . Musculoskeletal neck pain 10/14/2018  . Change in bowel function 08/11/2018  . Lower abdominal pain 08/11/2018  . Diverticulitis 08/11/2018  . Shoulder pain, left 02/23/2017  . B12 deficiency 08/07/2016  . Recurrent UTI 11/08/2015  . Non-celiac gluten sensitivity   . Anxiety state 01/17/2010  . ALLERGIC RHINITIS 01/17/2010  . Dyslipidemia 07/12/2009  . Essential hypertension 07/12/2009  . Osteoporosis 06/22/2008  . 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 ANEMIA, PERNICIOUS 10/14/2007  . IRRITABLE BOWEL SYNDROME 10/14/2007    Immunization History  Administered Date(s) Administered  . Fluad Quad(high Dose 65+) 06/08/2019, 06/27/2020  . Influenza Split 06/17/2012  . Influenza, High Dose Seasonal PF 06/06/2018  . Influenza,inj,Quad PF,6+ Mos 07/10/2013, 08/16/2014, 08/07/2016, 08/11/2017  . PFIZER(Purple Top)SARS-COV-2 Vaccination 10/02/2019, 10/23/2019, 06/19/2020  . Pneumococcal Conjugate-13 06/08/2019  . Pneumococcal Polysaccharide-23 06/27/2020  . Td 09/08/2003  . Tdap 11/08/2015  . Zoster Recombinat (Shingrix) 06/18/2017, 10/06/2017    Conditions to be addressed/monitored:  Hypertension, Hyperlipidemia and Osteoporosis  Care Plan : [place]  Updates made by  Charlton Haws, [place] since 11/14/2020 12:00 AM    Problem: Hypertension, Hyperlipidemia and Osteoporosis   Priority: High    Long-Range Goal: Disease management   Start Date: 11/14/2020  Expected End Date: 05/17/2021  This Visit's Progress: On track  Priority: High  Note:   Current Barriers:  . Unable to independently monitor therapeutic efficacy  Pharmacist Clinical Goal(s):  [name] Over the next 180 days, patient will achieve adherence to monitoring guidelines and medication adherence to achieve therapeutic efficacy through collaboration with PharmD and provider.   Interventions: . 1:1 collaboration with [name], MD regarding development and update of comprehensive plan of care as evidenced by provider attestation and co-signature . Inter-disciplinary care team collaboration (see longitudinal plan of care) . Comprehensive medication review performed; medication list updated in electronic medical record    Hypertension    BP goal is:  <140/90  Patient checks BP at home: weekly Patient home BP readings are ranging: SBP 133-150   Patient has failed these meds in the past: n/a Patient is currently CONTROLLED on the following medications:   Amlodipine 10 mg daily AM  HCTZ 12.5 mg daily AM   We discussed diet and exercise extensively; BP goals; benefits of medications; benefits of monitoring BP at home;  Plan   Continue current medications and control with diet and exercise  Monitor BP at home weekly   Hyperlipidemia    LDL goal < 100 Patient has failed these meds in past: n/a Patient is currently controlled on the following medications:   Pravastatin 40 mg daily PM   We discussed:  diet and exercise extensively; Cholesterol goals; benefits of statin for ASCVD risk reduction; pt denies side effects   Plan   Continue current medications and control with diet and exercise   Osteoporosis    Last DEXA Scan: 03/05/2017             T-Score femoral neck: -2.9              T-Score total hip: n/a             T-Score lumbar spine: -1.1   Patient is a candidate for pharmacologic treatment due to T-Score < -2.5 in femoral neck Patient has failed these meds in past: alendronate (GERD), calcium (stomach upset) Patient is currently uncontrolled on the following medications:   Vitamin D 1000 IU daily   We discussed:  Recommend [phone] units of vitamin D daily. Recommend 1200 mg of calcium daily from dietary and supplemental sources.  -advised to try different brand of Calcium to find one she can tolerate -Has discussed Prolia before, prevoiusly declined to get it -pt reports she had DEXA scan done Jan 2022 at OB/GYN office, was told slight worsening of BMD but no changes recommended. She will bring results to next PCP visit in April.   Plan   Continue current medications and control with diet and exercise  Patient Goals/Self-Care Activities . Over the next 180 days, patient will:  - take medications as prescribed focus on medication adherence by pill box check blood pressure weekly, document, and provide at future appointments  Follow Up Plan: Telephone follow up appointment with care management team member scheduled for: 6 months      Medication Assistance: None required.  Patient affirms current coverage meets needs.  Patient's preferred pharmacy is:  [id number] - [address], [place] [place] [address] Phone: [phone] Fax: [phone]  Uses pill box? Yes Pt endorses 100% compliance  We discussed: Current pharmacy is preferred with insurance plan and patient is satisfied with pharmacy services Patient decided to: Continue current medication management strategy  Care Plan and Follow Up Patient Decision:  Patient agrees to Care Plan and Follow-up.  Plan: Telephone follow up  appointment with care management team member scheduled for:  6  months  Charlene Brooke, PharmD, [facility] Clinical Pharmacist [facility] [phone]

## 2020-11-15 ENCOUNTER — Ambulatory Visit (HOSPITAL_COMMUNITY)
Admission: EM | Admit: 2020-11-15 | Discharge: 2020-11-15 | Disposition: A | Discharge status: Home / Self Care | Payer: Medicare Other | Attending: Medical Oncology | Admitting: Medical Oncology

## 2020-11-15 ENCOUNTER — Telehealth: Payer: Self-pay | Admitting: Internal Medicine

## 2020-11-15 ENCOUNTER — Encounter (HOSPITAL_COMMUNITY): Payer: Self-pay | Admitting: Medical Oncology

## 2020-11-15 DIAGNOSIS — R1032 Left lower quadrant pain: Secondary | ICD-10-CM

## 2020-11-15 LAB — POCT URINALYSIS DIPSTICK, ED / UC
Bilirubin Urine: NEGATIVE
Glucose, UA: NEGATIVE mg/dL
Ketones, ur: NEGATIVE mg/dL
Nitrite: POSITIVE — AB
Protein, ur: NEGATIVE mg/dL
Specific Gravity, Urine: 1.015 (ref 1.005–1.030)
Urobilinogen, UA: 0.2 mg/dL (ref 0.0–1.0)
pH: 7.5 (ref 5.0–8.0)

## 2020-11-15 MED ORDER — CIPROFLOXACIN HCL 500 MG PO TABS: 500.0000 mg | ORAL_TABLET | Freq: Two times a day (BID) | ORAL | 0 refills | Status: AC

## 2020-11-15 MED ORDER — METRONIDAZOLE 500 MG PO TABS: 500.0000 mg | ORAL_TABLET | Freq: Three times a day (TID) | ORAL | 0 refills | Status: AC

## 2020-11-15 NOTE — ED Provider Notes (Signed)
[place]    CSN: [id number] Arrival date & time: 11/15/20  0945      History   Chief Complaint Chief Complaint  Patient presents with  . Abdominal Pain    LLQ    HPI [name] is a 70 y.o. female.   HPI  Abdominal Pain: Pt has had LLQ abdominal pain since yesterday.  Symptoms feel similar to past diverticulitis flare but more mild.  Her last flare was over a year ago and was much more severe.  She did end up having a "contained perforation and abscess ".  She said that her symptoms were worse last night but have improved after she took MiraLAX and had a bowel movement.  She denies any fevers, dysuria, vomiting.    Past Medical History:  Diagnosis Date  . ALLERGIC RHINITIS   . ANEMIA, PERNICIOUS   . ANXIETY   . BILIARY DYSKINESIA   . CARPAL TUNNEL SYNDROME, LEFT   . DYSLIPIDEMIA   . Headache(784.0)   . HYPERTENSION   . Irritable bowel syndrome   . Non-celiac gluten sensitivity   . OSTEOPENIA   . 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 Fall 02/13/2020  . Left arm pain 02/13/2020  . Abdominal pain 12/07/2019  . Hyperglycemia 06/07/2019  . DDD (degenerative disc disease), cervical 02/02/2019  . Musculoskeletal neck pain 10/14/2018  . Change in bowel function 08/11/2018  . Lower abdominal pain 08/11/2018  . Diverticulitis 08/11/2018  . Shoulder pain, left 02/23/2017  . B12 deficiency 08/07/2016  . Recurrent UTI 11/08/2015  . Non-celiac gluten sensitivity   . Anxiety state 01/17/2010  . ALLERGIC RHINITIS 01/17/2010  . Dyslipidemia 07/12/2009  . Essential hypertension 07/12/2009  . Osteoporosis 06/22/2008  . 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 ANEMIA, PERNICIOUS 10/14/2007  . IRRITABLE BOWEL SYNDROME 10/14/2007    Past Surgical History:  Procedure Laterality Date  . APPENDECTOMY    . CARPAL TUNNEL RELEASE    . CESAREAN SECTION  76 & 85   x's 2   .  CHOLECYSTECTOMY  2005   Dr. Dalbert Batman (Gallbladder dyskinesia)  . COLONOSCOPY    . DILATION AND CURETTAGE OF UTERUS    . LEFT HEART CATHETERIZATION WITH CORONARY ANGIOGRAM N/A 05/19/2012   Procedure: LEFT HEART CATHETERIZATION WITH CORONARY ANGIOGRAM;  Surgeon: Hillary Bow, MD;  Location: [facility] CATH LAB;  Service: Cardiovascular;  Laterality: N/A;  . ROTATOR CUFF REPAIR    . TONSILLECTOMY    . UPPER GASTROINTESTINAL ENDOSCOPY      OB History   No obstetric history on file.      Home Medications    Prior to Admission medications   Medication Sig Start Date End Date Taking? Authorizing Provider  acetaminophen (TYLENOL) 500 MG tablet Take 1,000 mg by mouth every 8 (eight) hours as needed.    [provider]  ALPRAZolam (XANAX) 0.25 MG tablet TAKE 1 TABLET(0.25 MG) BY MOUTH TWICE DAILY AS NEEDED FOR ANXIETY 12/26/19   Burns, Claudina Lick, MD  amLODipine (NORVASC) 10 MG tablet Take 1 tablet (10 mg total) by mouth daily. 06/27/20   [name], MD  calcium citrate (CALCITRATE - DOSED IN MG ELEMENTAL CALCIUM) 950 (200 Ca) MG tablet Take 200 mg of elemental calcium by mouth 2 (two) times daily.    [provider]  cetirizine (ZYRTEC) 10 MG tablet Take 10 mg by mouth daily.  [provider]  cholecalciferol (VITAMIN D3) 25 MCG (1000 UT) tablet Take 1,000 Units by mouth 2 (two) times daily.    [provider]  hydrochlorothiazide (HYDRODIURIL) 12.5 MG tablet TAKE 1 TABLET BY MOUTH EVERY DAY 09/13/20   [name], MD  ibuprofen (ADVIL) 200 MG tablet Take 200 mg by mouth every 8 (eight) hours as needed.    [provider]  Ibuprofen-diphenhydrAMINE HCl 200-25 MG CAPS Take 1 tablet by mouth at bedtime as needed (pain/sleep).    [provider]  methocarbamol (ROBAXIN) 500 MG tablet TAKE 1 TABLET(500 MG) BY MOUTH TWICE DAILY AS NEEDED FOR MUSCLE SPASMS 08/26/20   Burns, Claudina Lick, MD  nitrofurantoin, macrocrystal-monohydrate, (MACROBID) 100 MG  capsule Take 1 capsule (100 mg total) by mouth daily. 07/25/20   [name], MD  phenylephrine (SUDAFED PE) 10 MG TABS tablet Take 10 mg by mouth every 4 (four) hours as needed.    [provider]  pravastatin (PRAVACHOL) 40 MG tablet TAKE 1 TABLET BY MOUTH DAILY 10/14/20   [name], MD  vitamin B-12 (CYANOCOBALAMIN) 1000 MCG tablet Take 1 tablet (1,000 mcg total) by mouth daily. 08/07/16   [name], MD    Family History Family History  Problem Relation Age of Onset  . Colitis Mother   . Heart disease Mother   . Hyperlipidemia Mother   . Lung cancer Mother        Cause of death  . Hypertension Mother   . Heart disease Brother   . Valvular heart disease Brother   . Heart attack Brother   . Atrial fibrillation Brother   . Heart failure Brother   . Miscarriages / [place] Brother   . Lung cancer Father   . Leukemia Brother   . Coronary artery disease Brother        MI in late 34's. Still living  . Ovarian cancer Maternal Aunt   . Diabetes Maternal Aunt   . Coronary artery disease Brother        MI in 25's. Also still living.  . Colon cancer Neg Hx   . Colon polyps Neg Hx     Social History Social History   Tobacco Use  . Smoking status: Never Smoker  . Smokeless tobacco: Never Used  Vaping Use  . Vaping Use: Never used  Substance Use Topics  . Alcohol use: Yes    Alcohol/week: 1.0 standard drink    Types: 1 Glasses of wine per week    Comment: occassional  . Drug use: No     Allergies   Fosamax [alendronate sodium], Hydrocodone-acetaminophen, Oxycodone-acetaminophen, and Tizanidine   Review of Systems Review of Systems  As discussed above in HPI. Physical Exam Triage Vital Signs ED Triage Vitals  Enc Vitals Group     BP 11/15/20 0957 (!) 164/77     Pulse Rate 11/15/20 0957 81     Resp 11/15/20 0957 18     Temp 11/15/20 0957 98.2 F (36.8 C)     Temp Source 11/15/20 0957 Oral     SpO2 11/15/20 0957 100 %     Weight --       Height --      Head Circumference --      Peak Flow --      Pain Score 11/15/20 0956 4     Pain Loc --      Pain Edu? --      Excl. in [place]? --  No data found.  Updated Vital Signs BP (!) 164/77 (BP Location: Left Arm)   Pulse 81   Temp 98.2 F (36.8 C) (Oral)   Resp 18   SpO2 100%   Physical Exam Vitals and nursing note reviewed.  Constitutional:      General: She is not in acute distress.    Appearance: She is well-developed. She is not ill-appearing, toxic-appearing or diaphoretic.  HENT:     Head: Normocephalic.     Mouth/Throat:     Mouth: Mucous membranes are moist.  Cardiovascular:     Rate and Rhythm: Normal rate and regular rhythm.     Heart sounds: Normal heart sounds.  Pulmonary:     Effort: Pulmonary effort is normal.     Breath sounds: Normal breath sounds.  Abdominal:     General: Abdomen is flat. Bowel sounds are normal. There is no distension. There are no signs of injury.     Palpations: Abdomen is rigid. There is no shifting dullness, fluid wave, hepatomegaly, splenomegaly, mass or pulsatile mass.     Tenderness: There is abdominal tenderness (mild) in the left lower quadrant.     Hernia: No hernia is present.  Skin:    Coloration: Skin is not jaundiced.  Neurological:     Mental Status: She is alert.      UC Treatments / Results  Labs (all labs ordered are listed, but only abnormal results are displayed) Labs Reviewed  URINE CULTURE  POCT URINALYSIS DIPSTICK, ED / UC    EKG   Radiology No results found.  Procedures Procedures (including critical care time)  Medications Ordered in UC Medications - No data to display  Initial Impression / Assessment and Plan / UC Course  I have reviewed the triage vital signs and the nursing notes.  Pertinent labs & imaging results that were available during my care of the patient were reviewed by me and considered in my medical decision making (see chart for details).     Concern for UTI vs  Diverticulitis.  Patient is aware that the only way to confirm diverticulitis is with a CT scan.  As she appears stable and her symptoms are rated as more mild in nature we will begin treatment per patient request.  Treating with Cipro/Flagyl combo as this will cover for both given UA- culture pending. Discussed with patient soft to liquid food diet along with red flag signs and symptoms.   Final Clinical Impressions(s) / UC Diagnoses   Final diagnoses:  None   Discharge Instructions   None    ED Prescriptions    None     PDMP not reviewed this encounter.   Hughie Closs, [place] 11/15/20 1028

## 2020-11-15 NOTE — Telephone Encounter (Signed)
[facility] FYI  Caller states she has pain on the lower abd and pain is worse of the left. Did not sleep well due to pain. pain is constant and intensifies at times. area is tender to the touch. No vomiting or diarrhea. Took miralax. had BM this am. Temp 96.8. pain level 8+ last night and now 5.  Advised to see PCP within 4 hours. Called back line, but no appts available. Advised patient to go to urgent care instead. Patient understood and agreed.

## 2020-11-15 NOTE — ED Triage Notes (Signed)
Pt presents with lower left abdominal pain that started yesterday. States is radiating all through lower abdominal area.

## 2020-11-17 ENCOUNTER — Encounter: Payer: Self-pay | Admitting: Internal Medicine

## 2020-11-17 LAB — URINE CULTURE: Culture: 80000 — AB

## 2020-12-02 ENCOUNTER — Telehealth (INDEPENDENT_AMBULATORY_CARE_PROVIDER_SITE_OTHER): Payer: Medicare Other | Admitting: Internal Medicine

## 2020-12-02 ENCOUNTER — Encounter: Payer: Self-pay | Admitting: Internal Medicine

## 2020-12-02 DIAGNOSIS — R600 Localized edema: Secondary | ICD-10-CM

## 2020-12-02 NOTE — Assessment & Plan Note (Signed)
Acute Fortunately I am not able to see her, but from her description it sounds like she has left submandibular salivary gland swelling likely related to possible stone,?  Infection Her symptoms have improved which is reassuring Discussed potential causes and treatment options.  Because her symptoms are improving she would like to try conservative treatment first and antibiotics only if her symptoms get worse or do not continue to improve Advised warm compresses, gentle massage on the gland, sucking on hard sour lemon candy, increase water intake, Tylenol as needed If her symptoms do not improve or worsen in any way she will call and I will start her on an antibiotic and consider imaging/ENT referral She agrees with plan

## 2020-12-02 NOTE — Progress Notes (Signed)
Virtual Visit via telephone note due to failed video-we are not able to hear each other  I connected with [name] on 12/02/20 at  2:30 PM EDT by a video enabled telemedicine application and verified that I am speaking with the correct person using two identifiers.   I discussed the limitations of evaluation and management by telemedicine and the availability of in person appointments. The patient expressed understanding and agreed to proceed.  Present for the visit:  Myself, Dr Billey Gosling, Gearlean Alf.  The patient is currently at home and I am in the office.    No referring provider.    History of Present Illness: This is an acute visit for swelling under left madible which spread toward the right side under the tongue.  The swelling got bigger by that evening.    She had a left ear ache.  Today her symptoms are intermittent, but better .  It was hard to eat.  It was tender under her jaw.   The swelling has improved-she still has tenderness and some swelling under the left jaw.  She feels exhausted.   No fever.  No consistent sore throat   Review of Systems  Constitutional: Positive for malaise/fatigue. Negative for fever.  HENT: Positive for ear pain (left) and sore throat (at times left side - sharp, fleeting). Negative for congestion and sinus pain.   Respiratory: Positive for cough (occ, due to dry mouth). Negative for shortness of breath and wheezing.   Neurological: Negative for dizziness and headaches.      Social History   Socioeconomic History  . Marital status: Married    Spouse name: Not on file  . Number of children: Not on file  . Years of education: Not on file  . Highest education level: Not on file  Occupational History  . 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 Smoking status: Never Smoker  . Smokeless tobacco: Never Used  Vaping Use  . Vaping Use: Never used  Substance and Sexual Activity  . Alcohol use: Yes     Alcohol/week: 1.0 standard drink    Types: 1 Glasses of wine per week    Comment: occassional  . Drug use: No  . Sexual activity: Yes    Birth control/protection: Post-menopausal  Other Topics Concern  . Not on file  Social History Narrative   Married and lives with husband.    Copywriter, advertising - retired   Scientist, physiological Strain: [place]   . Difficulty of Paying Living Expenses: Not hard at all  Food Insecurity: No Food Insecurity  . Worried About Charity fundraiser in the Last Year: Never true  . Ran Out of Food in the Last Year: Never true  Transportation Needs: No Transportation Needs  . Lack of Transportation (Medical): No  . Lack of Transportation (Non-Medical): No  Physical Activity: Sufficiently Active  . Days of Exercise per Week: 5 days  . Minutes of Exercise per Session: 30 min  Stress: No Stress Concern Present  . Feeling of Stress : Not at all  Social Connections: Socially Integrated  . Frequency of Communication with Friends and Family: More than three times a week  . Frequency of Social Gatherings with Friends and Family: Once a week  . Attends Religious Services: More than 4 times per year  . Active Member of Clubs or Organizations: Yes  . Attends Club or  Organization Meetings: More than 4 times per year  . Marital Status: Married     Observations/Objective: Appears well in NAD-I saw her briefly on video, but I was not able to hear her so this was very brief.  She was breathing normally   Assessment and Plan:  See Problem List for Assessment and Plan of chronic medical problems.   Follow Up Instructions:    I discussed the assessment and treatment plan with the patient. The patient was provided an opportunity to ask questions and all were answered. The patient agreed with the plan and demonstrated an understanding of the instructions.   The patient was advised to call back or seek an in-person evaluation if the  symptoms worsen or if the condition fails to improve as anticipated.  Time spent on telephone call: 14 minutes  [name], MD

## 2020-12-04 ENCOUNTER — Encounter: Payer: Self-pay | Admitting: Internal Medicine

## 2020-12-05 MED ORDER — CEPHALEXIN 500 MG PO CAPS: 500.0000 mg | ORAL_CAPSULE | Freq: Three times a day (TID) | ORAL | 0 refills | Status: DC

## 2020-12-09 ENCOUNTER — Encounter: Payer: Self-pay | Admitting: Internal Medicine

## 2020-12-13 ENCOUNTER — Other Ambulatory Visit: Payer: Self-pay

## 2020-12-14 DIAGNOSIS — Z23 Encounter for immunization: Secondary | ICD-10-CM | POA: Diagnosis not present

## 2020-12-15 NOTE — Patient Instructions (Addendum)
    Blood work was ordered.      Medications changes include :   none     Please followup in 6 months  

## 2020-12-15 NOTE — Progress Notes (Signed)
Subjective:    Patient ID: [name], female    DOB: 10/22/1950, 70 y.o.   MRN: [id number]  HPI The patient is here for follow up of their chronic medical problems, including htn, hyperlipidemia, anxiety, OP, recurrent UTI and salivary gland swelling.  She walks regularly.   ? Salivary gland swelling - She completes the antibiotic today - she has one more dose.  The swelling is better. her symptoms are pretty much gone.  She thinks she had something similar in the past that were not that severe.  She has had tonsillitis in the past even after her tonsils were removed.  BP at home 133-138/70's on average.    Medications and allergies reviewed with patient and updated if appropriate.  Patient Active Problem List   Diagnosis Date Noted  . Salivary gland swelling 12/02/2020  . Fall 02/13/2020  . Left arm pain 02/13/2020  . Abdominal pain 12/07/2019  . Hyperglycemia 06/07/2019  . DDD (degenerative disc disease), cervical 02/02/2019  . Musculoskeletal neck pain 10/14/2018  . Change in bowel function 08/11/2018  . Lower abdominal pain 08/11/2018  . Diverticulitis 08/11/2018  . Shoulder pain, left 02/23/2017  . B12 deficiency 08/07/2016  . Recurrent UTI 11/08/2015  . Non-celiac gluten sensitivity   . Anxiety state 01/17/2010  . ALLERGIC RHINITIS 01/17/2010  . Dyslipidemia 07/12/2009  . Essential hypertension 07/12/2009  . Osteoporosis 06/22/2008  . 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 ANEMIA, PERNICIOUS 10/14/2007  . IRRITABLE BOWEL SYNDROME 10/14/2007    Current Outpatient Medications on File Prior to Visit  Medication Sig Dispense Refill  . acetaminophen (TYLENOL) 500 MG tablet Take 1,000 mg by mouth every 8 (eight) hours as needed.    . ALPRAZolam (XANAX) 0.25 MG tablet TAKE 1 TABLET(0.25 MG) BY MOUTH TWICE DAILY AS NEEDED FOR ANXIETY 60 tablet 0  . amLODipine (NORVASC) 10 MG tablet Take 1 tablet (10 mg total) by mouth daily. 90 tablet 1  . calcium  citrate (CALCITRATE - DOSED IN MG ELEMENTAL CALCIUM) 950 (200 Ca) MG tablet Take 200 mg of elemental calcium by mouth 2 (two) times daily.    . cetirizine (ZYRTEC) 10 MG tablet Take 10 mg by mouth daily.    . cholecalciferol (VITAMIN D3) 25 MCG (1000 UT) tablet Take 1,000 Units by mouth 2 (two) times daily.    . hydrochlorothiazide (HYDRODIURIL) 12.5 MG tablet TAKE 1 TABLET BY MOUTH EVERY DAY 90 tablet 1  . ibuprofen (ADVIL) 200 MG tablet Take 200 mg by mouth every 8 (eight) hours as needed.    . Ibuprofen-diphenhydrAMINE HCl 200-25 MG CAPS Take 1 tablet by mouth at bedtime as needed (pain/sleep).    . methocarbamol (ROBAXIN) 500 MG tablet TAKE 1 TABLET(500 MG) BY MOUTH TWICE DAILY AS NEEDED FOR MUSCLE SPASMS 30 tablet 0  . phenylephrine (SUDAFED PE) 10 MG TABS tablet Take 10 mg by mouth daily.    . pravastatin (PRAVACHOL) 40 MG tablet TAKE 1 TABLET BY MOUTH DAILY 90 tablet 0  . vitamin B-12 (CYANOCOBALAMIN) 1000 MCG tablet Take 1 tablet (1,000 mcg total) by mouth daily.     No current facility-administered medications on file prior to visit.    Past Medical History:  Diagnosis Date  . ALLERGIC RHINITIS   . ANEMIA, PERNICIOUS   . ANXIETY   . BILIARY DYSKINESIA   . CARPAL TUNNEL SYNDROME, LEFT   . DYSLIPIDEMIA   . Headache(784.0)   . HYPERTENSION   . Irritable bowel syndrome   .  Non-celiac gluten sensitivity   . OSTEOPENIA   . 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 APPENDECTOMY    . CARPAL TUNNEL RELEASE    . CESAREAN SECTION  74 & 83   x's 2   . CHOLECYSTECTOMY  2005   Dr. Dalbert Batman (Gallbladder dyskinesia)  . COLONOSCOPY    . DILATION AND CURETTAGE OF UTERUS    . LEFT HEART CATHETERIZATION WITH CORONARY ANGIOGRAM N/A 05/19/2012   Procedure: LEFT HEART CATHETERIZATION WITH CORONARY ANGIOGRAM;  Surgeon: Hillary Bow, MD;  [facility];  Service: Cardiovascular;  Laterality: N/A;  . ROTATOR CUFF  REPAIR    . TONSILLECTOMY    . UPPER GASTROINTESTINAL ENDOSCOPY      Social History   Socioeconomic History  . Marital status: Married    Spouse name: Not on file  . Number of children: Not on file  . Years of education: Not on file  . Highest education level: Not on file  Occupational History  . 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 Smoking status: Never Smoker  . Smokeless tobacco: Never Used  Vaping Use  . Vaping Use: Never used  Substance and Sexual Activity  . Alcohol use: Yes    Alcohol/week: 1.0 standard drink    Types: 1 Glasses of wine per week    Comment: occassional  . Drug use: No  . Sexual activity: Yes    Birth control/protection: Post-menopausal  Other Topics Concern  . Not on file  Social History Narrative   Married and lives with husband.    Copywriter, advertising - retired   Scientist, physiological Strain: [place]   . Difficulty of Paying Living Expenses: Not hard at all  Food Insecurity: No Food Insecurity  . Worried About Charity fundraiser in the Last Year: Never true  . Ran Out of Food in the Last Year: Never true  Transportation Needs: No Transportation Needs  . Lack of Transportation (Medical): No  . Lack of Transportation (Non-Medical): No  Physical Activity: Sufficiently Active  . Days of Exercise per Week: 5 days  . Minutes of Exercise per Session: 30 min  Stress: No Stress Concern Present  . Feeling of Stress : Not at all  Social Connections: Socially Integrated  . Frequency of Communication with Friends and Family: More than three times a week  . Frequency of Social Gatherings with Friends and Family: Once a week  . Attends Religious Services: More than 4 times per year  . Active Member of Clubs or Organizations: Yes  . Attends Archivist Meetings: More than 4 times per year  . Marital Status: Married    Family History  Problem Relation Age of Onset  .  Colitis Mother   . Heart disease Mother   . Hyperlipidemia Mother   . Lung cancer Mother        Cause of death  . Hypertension Mother   . Heart disease Brother   . Valvular heart disease Brother   . Heart attack Brother   . Atrial fibrillation Brother   . Heart failure Brother   . Miscarriages / [place] Brother   . Lung cancer Father   . Leukemia Brother   . Coronary artery disease Brother        MI in late 20's. Still living  . Ovarian  cancer Maternal Aunt   . Diabetes Maternal Aunt   . Coronary artery disease Brother        MI in 60's. Also still living.  . Colon cancer Neg Hx   . Colon polyps Neg Hx     Review of Systems  Constitutional: Negative for chills, fatigue and fever.  HENT: Negative for congestion, sinus pressure, sinus pain, sore throat and trouble swallowing.        No neck swelling  Respiratory: Negative for cough, shortness of breath and wheezing.   Cardiovascular: Negative for chest pain, palpitations and leg swelling.  Neurological: Negative for light-headedness and headaches.       Objective:   Vitals:   12/16/20 0908  BP: (!) 144/70  Pulse: 72  Temp: 98 F (36.7 C)  SpO2: 98%   BP Readings from Last 3 Encounters:  12/16/20 (!) 144/70  11/15/20 (!) 164/77  11/06/20 138/80   Wt Readings from Last 3 Encounters:  12/16/20 111 lb (50.3 kg)  11/06/20 114 lb (51.7 kg)  06/27/20 113 lb (51.3 kg)   Body mass index is 21.68 kg/m.   Physical Exam Constitutional:      General: She is not in acute distress.    Appearance: Normal appearance. She is not ill-appearing.  HENT:     Head: Normocephalic and atraumatic.     Mouth/Throat:     Mouth: Mucous membranes are moist.     Pharynx: No oropharyngeal exudate or posterior oropharyngeal erythema.  Eyes:     Conjunctiva/sclera: Conjunctivae normal.  Neck:     Vascular: No carotid bruit.  Cardiovascular:     Rate and Rhythm: Normal rate and regular rhythm.     Heart sounds: No murmur  heard.   Pulmonary:     Effort: Pulmonary effort is normal. No respiratory distress.     Breath sounds: No wheezing or rales.  Musculoskeletal:     Cervical back: Neck supple. No rigidity or tenderness.     Right lower leg: No edema.     Left lower leg: No edema.  Lymphadenopathy:     Cervical: No cervical adenopathy.  Skin:    General: Skin is warm and dry.  Neurological:     Mental Status: She is alert.  Psychiatric:        Mood and Affect: Mood normal.        Behavior: Behavior normal.           Assessment & Plan:    See Problem List for Assessment and Plan of chronic medical problems.    This visit occurred during the SARS-CoV-2 public health emergency.  Safety protocols were in place, including screening questions prior to the visit, additional usage of staff PPE, and extensive cleaning of exam room while observing appropriate contact time as indicated for disinfecting solutions.

## 2020-12-16 ENCOUNTER — Ambulatory Visit (INDEPENDENT_AMBULATORY_CARE_PROVIDER_SITE_OTHER): Payer: Medicare Other | Admitting: Internal Medicine

## 2020-12-16 ENCOUNTER — Encounter: Payer: Self-pay | Admitting: Internal Medicine

## 2020-12-16 ENCOUNTER — Other Ambulatory Visit: Payer: Self-pay

## 2020-12-16 VITALS — BP 144/70 | HR 72 | Temp 98.0°F | Ht 60.0 in | Wt 111.0 lb

## 2020-12-16 DIAGNOSIS — I10 Essential (primary) hypertension: Secondary | ICD-10-CM | POA: Diagnosis not present

## 2020-12-16 DIAGNOSIS — N390 Urinary tract infection, site not specified: Secondary | ICD-10-CM | POA: Diagnosis not present

## 2020-12-16 DIAGNOSIS — F411 Generalized anxiety disorder: Secondary | ICD-10-CM

## 2020-12-16 DIAGNOSIS — M810 Age-related osteoporosis without current pathological fracture: Secondary | ICD-10-CM

## 2020-12-16 DIAGNOSIS — R600 Localized edema: Secondary | ICD-10-CM | POA: Diagnosis not present

## 2020-12-16 DIAGNOSIS — E785 Hyperlipidemia, unspecified: Secondary | ICD-10-CM | POA: Diagnosis not present

## 2020-12-16 LAB — COMPREHENSIVE METABOLIC PANEL
ALT: 25 U/L (ref 0–35)
AST: 25 U/L (ref 0–37)
Albumin: 4.9 g/dL (ref 3.5–5.2)
Alkaline Phosphatase: 57 U/L (ref 39–117)
BUN: 9 mg/dL (ref 6–23)
CO2: 30 mEq/L (ref 19–32)
Calcium: 10 mg/dL (ref 8.4–10.5)
Chloride: 95 mEq/L — ABNORMAL LOW (ref 96–112)
Creatinine, Ser: 0.65 mg/dL (ref 0.40–1.20)
GFR: 90.92 mL/min (ref >60.00)
Glucose, Bld: 101 mg/dL — ABNORMAL HIGH (ref 70–99)
Potassium: 3.4 mEq/L — ABNORMAL LOW (ref 3.5–5.1)
Sodium: 134 mEq/L — ABNORMAL LOW (ref 135–145)
Total Bilirubin: 0.6 mg/dL (ref 0.2–1.2)
Total Protein: 7.2 g/dL (ref 6.0–8.3)

## 2020-12-16 LAB — LIPID PANEL
Cholesterol: 180 mg/dL (ref 0–200)
HDL: 95.5 mg/dL
LDL Cholesterol: 72 mg/dL (ref 0–99)
NonHDL: 84.3
Total CHOL/HDL Ratio: 2
Triglycerides: 60 mg/dL (ref 0.0–149.0)
VLDL: 12 mg/dL (ref 0.0–40.0)

## 2020-12-16 NOTE — Assessment & Plan Note (Signed)
Chronic Controlled, stable Continue xanax 0.25 mg daily prn for anxiety/sleep

## 2020-12-16 NOTE — Assessment & Plan Note (Signed)
Subacute Treated over telephone call ? Salivary gland swelling or deep throat infection/tonsilitis  Improved, resolved Completes keflex today If recurs will treat, ideally image and possible ENT evaluation

## 2020-12-16 NOTE — Assessment & Plan Note (Signed)
Chronic Check lipid panel  Continue pravastatin 40 mg daily Regular exercise and healthy diet encouraged  

## 2020-12-16 NOTE — Assessment & Plan Note (Signed)
Chronic No uti symptoms Continue nitrofurantoin 100 mg daily

## 2020-12-16 NOTE — Assessment & Plan Note (Signed)
Chronic BP well controlled at home, a little high here today Continue amlodipine 10 mg daily, hctz 12.5 mg daily cmp

## 2020-12-21 ENCOUNTER — Other Ambulatory Visit: Payer: Self-pay | Admitting: Internal Medicine

## 2021-01-13 ENCOUNTER — Other Ambulatory Visit: Payer: Self-pay | Admitting: Internal Medicine

## 2021-01-14 ENCOUNTER — Other Ambulatory Visit: Payer: Self-pay | Admitting: Internal Medicine

## 2021-01-20 ENCOUNTER — Ambulatory Visit: Payer: Medicare Other | Attending: Critical Care Medicine

## 2021-01-20 DIAGNOSIS — Z20822 Contact with and (suspected) exposure to covid-19: Secondary | ICD-10-CM

## 2021-01-21 LAB — NOVEL CORONAVIRUS, NAA: SARS-CoV-2, NAA: NOT DETECTED

## 2021-01-21 LAB — SARS-COV-2, NAA 2 DAY TAT

## 2021-01-22 ENCOUNTER — Other Ambulatory Visit: Payer: Self-pay | Admitting: Internal Medicine

## 2021-01-29 ENCOUNTER — Telehealth: Payer: Self-pay | Admitting: Pharmacist

## 2021-01-29 NOTE — Progress Notes (Signed)
Chronic Care Management Pharmacy Assistant   Name: [name]  MRN: [id number] DOB: 07-25-1951   Reason for Encounter: Disease State Hypertension Call   Conditions to be addressed/monitored: HTN   Recent office visits:  12/02/20 Dr. Quay Burow video Call 12/16/20 Dr. Quay Burow   Recent consult visits:  None ID  Hospital visits:  Medication Reconciliation was completed by comparing discharge summary, patient's EMR and Pharmacy list, and upon discussion with patient.  Admitted to the hospital on 11/15/20 due to abdominal pain. Discharge date was 11/15/20 . Discharged from [facility].    New?Medications Started at [facility] Discharge:?? -started ciprofloxacin and metronidazole antibotic    Medication Changes at Hospital Discharge: -Changed   Medications Discontinued at Hospital Discharge: -Stopped   Medications that remain the same after Hospital Discharge:??  -All other medications will remain the same.    Medications: Outpatient Encounter Medications as of 01/29/2021  Medication Sig  . ALPRAZolam (XANAX) 0.25 MG tablet TAKE 1 TABLET(0.25 MG) BY MOUTH TWICE DAILY AS NEEDED FOR ANXIETY  . acetaminophen (TYLENOL) 500 MG tablet Take 1,000 mg by mouth every 8 (eight) hours as needed.  [name] amLODipine (NORVASC) 10 MG tablet TAKE 1 TABLET(10 MG) BY MOUTH DAILY  . calcium citrate (CALCITRATE - DOSED IN MG ELEMENTAL CALCIUM) 950 (200 Ca) MG tablet Take 200 mg of elemental calcium by mouth 2 (two) times daily.  . cetirizine (ZYRTEC) 10 MG tablet Take 10 mg by mouth daily.  . cholecalciferol (VITAMIN D3) 25 MCG (1000 UT) tablet Take 1,000 Units by mouth 2 (two) times daily.  . hydrochlorothiazide (HYDRODIURIL) 12.5 MG tablet TAKE 1 TABLET BY MOUTH EVERY DAY  . ibuprofen (ADVIL) 200 MG tablet Take 200 mg by mouth every 8 (eight) hours as needed.  . Ibuprofen-diphenhydrAMINE HCl 200-25 MG CAPS Take 1 tablet by mouth at bedtime as needed (pain/sleep).  . methocarbamol (ROBAXIN) 500 MG  tablet TAKE 1 TABLET(500 MG) BY MOUTH TWICE DAILY AS NEEDED FOR MUSCLE SPASMS  . nitrofurantoin, macrocrystal-monohydrate, (MACROBID) 100 MG capsule TAKE 1 CAPSULE(100 MG) BY MOUTH DAILY  . phenylephrine (SUDAFED PE) 10 MG TABS tablet Take 10 mg by mouth daily.  . pravastatin (PRAVACHOL) 40 MG tablet TAKE 1 TABLET BY MOUTH DAILY  . vitamin B-12 (CYANOCOBALAMIN) 1000 MCG tablet Take 1 tablet (1,000 mcg total) by mouth daily.   No facility-administered encounter medications on file as of 01/29/2021.   Reviewed chart prior to disease state call. Spoke with patient regarding BP  Recent Office Vitals: BP Readings from Last 3 Encounters:  12/16/20 (!) 144/70  11/15/20 (!) 164/77  11/06/20 138/80   Pulse Readings from Last 3 Encounters:  12/16/20 72  11/15/20 81  11/06/20 71    Wt Readings from Last 3 Encounters:  12/16/20 111 lb (50.3 kg)  11/06/20 114 lb (51.7 kg)  06/27/20 113 lb (51.3 kg)     Kidney Function Lab Results  Component Value Date/Time   CREATININE 0.65 12/16/2020 09:48 AM   CREATININE 0.69 06/27/2020 09:11 AM   GFR 90.92 12/16/2020 09:48 AM   GFRNONAA >60 08/14/2018 04:18 AM   GFRAA >60 08/14/2018 04:18 AM    BMP Latest Ref Rng & Units 12/16/2020 06/27/2020 06/08/2019  Glucose 70 - 99 mg/dL 101(H) 93 111(H)  BUN 6 - 23 mg/dL 9 8 8   Creatinine 0.40 - 1.20 mg/dL 0.65 0.69 0.64  Sodium 135 - 145 mEq/L 134(L) 135 134(L)  Potassium 3.5 - 5.1 mEq/L 3.4(L) 4.1 4.1  Chloride 96 - 112  mEq/L 95(L) 97 97  CO2 19 - 32 mEq/L 30 27 28   Calcium 8.4 - 10.5 mg/dL 10.0 9.8 9.8    . Current antihypertensive regimen:  Amlodipine 10 mg Hctz 12.5 mg daily  . How often are you checking your Blood Pressure? Patient states that she does not check blood pressure  . Current home BP readings: Patient has no home readings  . What recent interventions/DTPs have been made by any provider to improve Blood Pressure control since last CPP Visit: None ID  . Any recent hospitalizations or  ED visits since last visit with CPP? Yes, patient was seen on 11/15/20 for abdominal pain  . What diet changes have been made to improve Blood Pressure Control?  Patient states she has not made any changes to diet  . What exercise is being done to improve your Blood Pressure Control?  Patient is not exercising at this time  Adherence Review: Is the patient currently on ACE/ARB medication? Yes Does the patient have >5 day gap between last estimated fill dates? No  Star Rating Drugs: Pravastatin 01/13/21 90 d  Ethelene Hal Clinical Pharmacist Assistant [phone]  Time spent:33

## 2021-02-13 ENCOUNTER — Encounter: Payer: Self-pay | Admitting: Internal Medicine

## 2021-03-09 ENCOUNTER — Other Ambulatory Visit: Payer: Self-pay | Admitting: Internal Medicine

## 2021-03-20 ENCOUNTER — Telehealth: Payer: Self-pay | Admitting: Pharmacist

## 2021-03-20 NOTE — Progress Notes (Signed)
Chronic Care Management Pharmacy Assistant   Name: [name]  MRN: [id number] DOB: [birth year]   Reason for Encounter: Disease State   Conditions to be addressed/monitored: HTN   Recent office visits:  None ID  Recent consult visits:  None ID  Hospital visits:  None in previous 6 months  Medications: Outpatient Encounter Medications as of 03/20/2021  Medication Sig   ALPRAZolam (XANAX) 0.25 MG tablet TAKE 1 TABLET(0.25 MG) BY MOUTH TWICE DAILY AS NEEDED FOR ANXIETY   acetaminophen (TYLENOL) 500 MG tablet Take 1,000 mg by mouth every 8 (eight) hours as needed.   amLODipine (NORVASC) 10 MG tablet TAKE 1 TABLET(10 MG) BY MOUTH DAILY   calcium citrate (CALCITRATE - DOSED IN MG ELEMENTAL CALCIUM) 950 (200 Ca) MG tablet Take 200 mg of elemental calcium by mouth 2 (two) times daily.   cetirizine (ZYRTEC) 10 MG tablet Take 10 mg by mouth daily.   cholecalciferol (VITAMIN D3) 25 MCG (1000 UT) tablet Take 1,000 Units by mouth 2 (two) times daily.   hydrochlorothiazide (HYDRODIURIL) 12.5 MG tablet TAKE 1 TABLET BY MOUTH EVERY DAY   ibuprofen (ADVIL) 200 MG tablet Take 200 mg by mouth every 8 (eight) hours as needed.   Ibuprofen-diphenhydrAMINE HCl 200-25 MG CAPS Take 1 tablet by mouth at bedtime as needed (pain/sleep).   methocarbamol (ROBAXIN) 500 MG tablet TAKE 1 TABLET(500 MG) BY MOUTH TWICE DAILY AS NEEDED FOR MUSCLE SPASMS   nitrofurantoin, macrocrystal-monohydrate, (MACROBID) 100 MG capsule TAKE 1 CAPSULE(100 MG) BY MOUTH DAILY   phenylephrine (SUDAFED PE) 10 MG TABS tablet Take 10 mg by mouth daily.   pravastatin (PRAVACHOL) 40 MG tablet TAKE 1 TABLET BY MOUTH DAILY   vitamin B-12 (CYANOCOBALAMIN) 1000 MCG tablet Take 1 tablet (1,000 mcg total) by mouth daily.   No facility-administered encounter medications on file as of 03/20/2021.    Pharmacist Review  Reviewed chart prior to disease state call. Spoke with patient regarding BP  Recent Office Vitals: BP Readings  from Last 3 Encounters:  12/16/20 (!) 144/70  11/15/20 (!) 164/77  11/06/20 138/80   Pulse Readings from Last 3 Encounters:  12/16/20 72  11/15/20 81  11/06/20 71    Wt Readings from Last 3 Encounters:  12/16/20 111 lb (50.3 kg)  11/06/20 114 lb (51.7 kg)  06/27/20 113 lb (51.3 kg)     Kidney Function Lab Results  Component Value Date/Time   CREATININE 0.65 12/16/2020 09:48 AM   CREATININE 0.69 06/27/2020 09:11 AM   GFR 90.92 12/16/2020 09:48 AM   GFRNONAA >60 08/14/2018 04:18 AM   GFRAA >60 08/14/2018 04:18 AM    BMP Latest Ref Rng & Units 12/16/2020 06/27/2020 06/08/2019  Glucose 70 - 99 mg/dL 101(H) 93 111(H)  BUN 6 - 23 mg/dL 9 8 8   Creatinine 0.40 - 1.20 mg/dL 0.65 0.69 0.64  Sodium 135 - 145 mEq/L 134(L) 135 134(L)  Potassium 3.5 - 5.1 mEq/L 3.4(L) 4.1 4.1  Chloride 96 - 112 mEq/L 95(L) 97 97  CO2 19 - 32 mEq/L 30 27 28   Calcium 8.4 - 10.5 mg/dL 10.0 9.8 9.8    Current antihypertensive regimen:  HCTZ 12.5 mg daily Amlodipine 10 mg daily  How often are you checking your Blood Pressure? when feeling symptomatic, patient states that she only checks every now and then but lately she feels like blood pressure is in the norm  Current home BP readings: Patient states that she does not remember or logs her blood pressure  What  recent interventions/DTPs have been made by any provider to improve Blood Pressure control since last CPP Visit: None ID  Any recent hospitalizations or ED visits since last visit with CPP? No  What diet changes have been made to improve Blood Pressure Control?  Patient states that she has not made any changes to her diet but believes she eats pretty healthy  What exercise is being done to improve your Blood Pressure Control?  Patient states that she walks occasionally sometimes up to 4 miles  Adherence Review: Is the patient currently on ACE/ARB medication? No Does the patient have >5 day gap between last estimated fill dates? No   Star  Rating Drugs: Pravastatin 01/13/21 90 ds  [place] Pharmacist Assistant [phone]   Time spent:20

## 2021-04-12 ENCOUNTER — Other Ambulatory Visit: Payer: Self-pay | Admitting: Internal Medicine

## 2021-05-08 ENCOUNTER — Telehealth: Payer: Medicare Other

## 2021-05-22 ENCOUNTER — Telehealth: Payer: Medicare Other

## 2021-05-28 DIAGNOSIS — Z23 Encounter for immunization: Secondary | ICD-10-CM | POA: Diagnosis not present

## 2021-06-14 DIAGNOSIS — S60222A Contusion of left hand, initial encounter: Secondary | ICD-10-CM | POA: Diagnosis not present

## 2021-06-14 DIAGNOSIS — S46012A Strain of muscle(s) and tendon(s) of the rotator cuff of left shoulder, initial encounter: Secondary | ICD-10-CM | POA: Diagnosis not present

## 2021-06-14 DIAGNOSIS — M25522 Pain in left elbow: Secondary | ICD-10-CM | POA: Diagnosis not present

## 2021-06-17 ENCOUNTER — Other Ambulatory Visit: Payer: Self-pay | Admitting: Internal Medicine

## 2021-06-24 DIAGNOSIS — S46012A Strain of muscle(s) and tendon(s) of the rotator cuff of left shoulder, initial encounter: Secondary | ICD-10-CM | POA: Diagnosis not present

## 2021-06-24 DIAGNOSIS — M25522 Pain in left elbow: Secondary | ICD-10-CM | POA: Diagnosis not present

## 2021-06-24 DIAGNOSIS — S60222A Contusion of left hand, initial encounter: Secondary | ICD-10-CM | POA: Diagnosis not present

## 2021-06-25 NOTE — Patient Instructions (Addendum)
   Flu immunization administered today.     Blood work was ordered.     Medications changes include :  none   Your prescription(s) have been submitted to your pharmacy. Please take as directed and contact our office if you believe you are having problem(s) with the medication(s).    Please followup in 6 months

## 2021-06-25 NOTE — Progress Notes (Signed)
Subjective:    Patient ID: [name], female    DOB: [birth year], 70 y.o.   MRN: [id number]  This visit occurred during the SARS-CoV-2 public health emergency.  Safety protocols were in place, including screening questions prior to the visit, additional usage of staff PPE, and extensive cleaning of exam room while observing appropriate contact time as indicated for disinfecting solutions.     HPI The patient is here for follow up of their chronic medical problems, including htn, hld, anxiety, OP, recurrent UTI   She walks regularly.    Last week fell and injured left shoulder, elbow and hand.  She saw ortho.  No major injuries-pain is improving.  She had a recent cold and is still has some residual symptoms, but her symptoms are improving.   Medications and allergies reviewed with patient and updated if appropriate.  Patient Active Problem List   Diagnosis Date Noted   Salivary gland swelling 12/02/2020   Fall 02/13/2020   Left arm pain 02/13/2020   Abdominal pain 12/07/2019   Hyperglycemia 06/07/2019   DDD (degenerative disc disease), cervical 02/02/2019   Musculoskeletal neck pain 10/14/2018   Change in bowel function 08/11/2018   Lower abdominal pain 08/11/2018   Diverticulitis 08/11/2018   Shoulder pain, left 02/23/2017   B12 deficiency 08/07/2016   Recurrent UTI 11/08/2015   Non-celiac gluten sensitivity    Anxiety state 01/17/2010   ALLERGIC RHINITIS 01/17/2010   Dyslipidemia 07/12/2009   Essential hypertension 07/12/2009   Osteoporosis 06/22/2008   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[place]) 10/14/2007   ANEMIA, PERNICIOUS 10/14/2007   IRRITABLE BOWEL SYNDROME 10/14/2007    Current Outpatient Medications on File Prior to Visit  Medication Sig Dispense Refill   acetaminophen (TYLENOL) 500 MG tablet Take 1,000 mg by mouth every 8 (eight) hours as needed.     ALPRAZolam (XANAX) 0.25 MG tablet TAKE 1 TABLET(0.25 MG) BY MOUTH TWICE DAILY AS NEEDED FOR ANXIETY 60  tablet 0   amLODipine (NORVASC) 10 MG tablet TAKE 1 TABLET(10 MG) BY MOUTH DAILY 90 tablet 1   calcium citrate (CALCITRATE - DOSED IN MG ELEMENTAL CALCIUM) 950 (200 Ca) MG tablet Take 200 mg of elemental calcium by mouth 2 (two) times daily.     cetirizine (ZYRTEC) 10 MG tablet Take 10 mg by mouth daily.     cholecalciferol (VITAMIN D3) 25 MCG (1000 UT) tablet Take 1,000 Units by mouth 2 (two) times daily.     hydrochlorothiazide (HYDRODIURIL) 12.5 MG tablet TAKE 1 TABLET BY MOUTH EVERY DAY 90 tablet 1   ibuprofen (ADVIL) 200 MG tablet Take 200 mg by mouth every 8 (eight) hours as needed.     Ibuprofen-diphenhydrAMINE HCl 200-25 MG CAPS Take 1 tablet by mouth at bedtime as needed (pain/sleep).     methocarbamol (ROBAXIN) 500 MG tablet TAKE 1 TABLET(500 MG) BY MOUTH TWICE DAILY AS NEEDED FOR MUSCLE SPASMS 30 tablet 0   nitrofurantoin, macrocrystal-monohydrate, (MACROBID) 100 MG capsule TAKE 1 CAPSULE(100 MG) BY MOUTH DAILY 90 capsule 1   phenylephrine (SUDAFED PE) 10 MG TABS tablet Take 10 mg by mouth daily.     pravastatin (PRAVACHOL) 40 MG tablet TAKE 1 TABLET BY MOUTH DAILY 90 tablet 0   vitamin B-12 (CYANOCOBALAMIN) 1000 MCG tablet Take 1 tablet (1,000 mcg total) by mouth daily.     No current facility-administered medications on file prior to visit.    Past Medical History:  Diagnosis Date   ALLERGIC RHINITIS    ANEMIA, PERNICIOUS  ANXIETY    BILIARY DYSKINESIA    CARPAL TUNNEL SYNDROME, LEFT    DYSLIPIDEMIA    Headache(784.0)    HYPERTENSION    Irritable bowel syndrome    Non-celiac gluten sensitivity    OSTEOPENIA    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[facility])    borderline    Past Surgical History:  Procedure Laterality Date   APPENDECTOMY     CARPAL TUNNEL RELEASE     CESAREAN SECTION  76 & 85   x's 2    CHOLECYSTECTOMY  2005   Dr. Dalbert Batman (Gallbladder dyskinesia)   COLONOSCOPY     DILATION AND CURETTAGE OF UTERUS     LEFT HEART CATHETERIZATION WITH CORONARY ANGIOGRAM  N/A 05/19/2012   Procedure: LEFT HEART CATHETERIZATION WITH CORONARY ANGIOGRAM;  Surgeon: Hillary Bow, MD;  Location: [facility] CATH LAB;  Service: Cardiovascular;  Laterality: N/A;   ROTATOR CUFF REPAIR     TONSILLECTOMY     UPPER GASTROINTESTINAL ENDOSCOPY      Social History   Socioeconomic History   Marital status: Married    Spouse name: Not on file   Number of children: Not on file   Years of education: Not on file   Highest education level: Not on file  Occupational History   Occupation: Dental office/ Retired    Fish farm manager: [employer]  Tobacco Use   Smoking status: Never   Smokeless tobacco: Never  Vaping Use   Vaping Use: Never used  Substance and Sexual Activity   Alcohol use: Yes    Alcohol/week: 1.0 standard drink    Types: 1 Glasses of wine per week    Comment: occassional   Drug use: No   Sexual activity: Yes    Birth control/protection: Post-menopausal  Other Topics Concern   Not on file  Social History Narrative   Married and lives with husband.    Copywriter, advertising - retired   Investment banker, operational of Radio broadcast assistant Strain: Low Risk    Difficulty of Paying Living Expenses: Not hard at all  Food Insecurity: No Food Insecurity   Worried About Charity fundraiser in the Last Year: Never true   Arboriculturist in the Last Year: Never true  Transportation Needs: No Transportation Needs   Lack of Transportation (Medical): No   Lack of Transportation (Non-Medical): No  Physical Activity: Sufficiently Active   Days of Exercise per Week: 5 days   Minutes of Exercise per Session: 30 min  Stress: No Stress Concern Present   Feeling of Stress : Not at all  Social Connections: Socially Integrated   Frequency of Communication with Friends and Family: More than three times a week   Frequency of Social Gatherings with Friends and Family: Once a week   Attends Religious Services: More than 4 times per year   Active Member of [employer] or Organizations:  Yes   Attends Music therapist: More than 4 times per year   Marital Status: Married    Family History  Problem Relation Age of Onset   Colitis Mother    Heart disease Mother    Hyperlipidemia Mother    Lung cancer Mother        Cause of death   Hypertension Mother    Heart disease Brother    Valvular heart disease Brother    Heart attack Brother    Atrial fibrillation Brother    Heart failure Brother    Miscarriages / [place] Brother  Lung cancer Father    Leukemia Brother    Coronary artery disease Brother        MI in late 40's. Still living   Ovarian cancer Maternal Aunt    Diabetes Maternal Aunt    Coronary artery disease Brother        MI in 39's. Also still living.   Colon cancer Neg Hx    Colon polyps Neg Hx     Review of Systems  Constitutional:  Negative for fever.  Respiratory:  Positive for cough (URI related - dry). Negative for shortness of breath and wheezing.   Cardiovascular:  Negative for chest pain, palpitations and leg swelling.  Neurological:  Positive for headaches (slight from URI). Negative for light-headedness.      Objective:   Vitals:   06/26/21 0933  BP: 140/70  Pulse: 72  Temp: 98 F (36.7 C)  SpO2: 99%   BP Readings from Last 3 Encounters:  06/26/21 140/70  12/16/20 (!) 144/70  11/15/20 (!) 164/77   Wt Readings from Last 3 Encounters:  06/26/21 113 lb (51.3 kg)  12/16/20 111 lb (50.3 kg)  11/06/20 114 lb (51.7 kg)   Body mass index is 22.07 kg/m.   Physical Exam    Constitutional: Appears well-developed and well-nourished. No distress.  HENT:  Head: Normocephalic and atraumatic.  Neck: Neck supple. No tracheal deviation present. No thyromegaly present.  No cervical lymphadenopathy Cardiovascular: Normal rate, regular rhythm and normal heart sounds.   No murmur heard. No carotid bruit .  No edema Pulmonary/Chest: Effort normal and breath sounds normal. No respiratory distress. No has no wheezes. No  rales.  Skin: Skin is warm and dry. Not diaphoretic.  Psychiatric: Normal mood and affect. Behavior is normal.      Assessment & Plan:    See Problem List for Assessment and Plan of chronic medical problems.

## 2021-06-26 ENCOUNTER — Encounter: Payer: Self-pay | Admitting: Internal Medicine

## 2021-06-26 ENCOUNTER — Ambulatory Visit (INDEPENDENT_AMBULATORY_CARE_PROVIDER_SITE_OTHER): Payer: Medicare Other | Admitting: Internal Medicine

## 2021-06-26 ENCOUNTER — Other Ambulatory Visit: Payer: Self-pay

## 2021-06-26 VITALS — BP 140/70 | HR 72 | Temp 98.0°F | Ht 60.0 in | Wt 113.0 lb

## 2021-06-26 DIAGNOSIS — I10 Essential (primary) hypertension: Secondary | ICD-10-CM

## 2021-06-26 DIAGNOSIS — M810 Age-related osteoporosis without current pathological fracture: Secondary | ICD-10-CM

## 2021-06-26 DIAGNOSIS — E785 Hyperlipidemia, unspecified: Secondary | ICD-10-CM

## 2021-06-26 DIAGNOSIS — R739 Hyperglycemia, unspecified: Secondary | ICD-10-CM

## 2021-06-26 DIAGNOSIS — E538 Deficiency of other specified B group vitamins: Secondary | ICD-10-CM

## 2021-06-26 DIAGNOSIS — F411 Generalized anxiety disorder: Secondary | ICD-10-CM | POA: Diagnosis not present

## 2021-06-26 DIAGNOSIS — R600 Localized edema: Secondary | ICD-10-CM | POA: Diagnosis not present

## 2021-06-26 DIAGNOSIS — N390 Urinary tract infection, site not specified: Secondary | ICD-10-CM | POA: Diagnosis not present

## 2021-06-26 LAB — COMPREHENSIVE METABOLIC PANEL
ALT: 22 U/L (ref 0–35)
AST: 23 U/L (ref 0–37)
Albumin: 5.2 g/dL (ref 3.5–5.2)
Alkaline Phosphatase: 59 U/L (ref 39–117)
BUN: 8 mg/dL (ref 6–23)
CO2: 29 mEq/L (ref 19–32)
Calcium: 10 mg/dL (ref 8.4–10.5)
Chloride: 97 mEq/L (ref 96–112)
Creatinine, Ser: 0.66 mg/dL (ref 0.40–1.20)
GFR: 90.25 mL/min (ref >60.00)
Glucose, Bld: 102 mg/dL — ABNORMAL HIGH (ref 70–99)
Potassium: 4.1 mEq/L (ref 3.5–5.1)
Sodium: 136 mEq/L (ref 135–145)
Total Bilirubin: 0.7 mg/dL (ref 0.2–1.2)
Total Protein: 7.2 g/dL (ref 6.0–8.3)

## 2021-06-26 LAB — LIPID PANEL
Cholesterol: 225 mg/dL — ABNORMAL HIGH (ref 0–200)
HDL: 116.8 mg/dL (ref >39.00)
LDL Cholesterol: 95 mg/dL (ref 0–99)
NonHDL: 108.18
Total CHOL/HDL Ratio: 2
Triglycerides: 67 mg/dL (ref 0.0–149.0)
VLDL: 13.4 mg/dL (ref 0.0–40.0)

## 2021-06-26 LAB — VITAMIN B12: Vitamin B-12: 1351 pg/mL — ABNORMAL HIGH (ref 211–911)

## 2021-06-26 LAB — HEMOGLOBIN A1C: Hgb A1c MFr Bld: 5.1 % (ref 4.6–6.5)

## 2021-06-26 NOTE — Assessment & Plan Note (Signed)
Chronic Controlled, stable Continue Xanax 0.25 mg daily as needed for anxiety and sleep

## 2021-06-26 NOTE — Assessment & Plan Note (Signed)
Chronic No recent urinary tract infections Continue nitrofurantoin 100 mg daily

## 2021-06-26 NOTE — Assessment & Plan Note (Signed)
Chronic Intermittent Has had 3-4 episodes of left-sided salivary gland swelling-no obvious pattern to when this occurs No current symptoms If recurs would like to get imaging done at that time and possibly refer to ENT

## 2021-06-26 NOTE — Assessment & Plan Note (Signed)
Chronic Regular exercise and healthy diet encouraged Check lipid panel  Continue pravastatin 40 mg daily 

## 2021-06-26 NOTE — Assessment & Plan Note (Signed)
Chronic Check a1c Low sugar / carb diet Stressed regular exercise  

## 2021-06-26 NOTE — Assessment & Plan Note (Signed)
Chronic Taking B12 daily Check level 

## 2021-06-26 NOTE — Assessment & Plan Note (Signed)
Chronic Blood pressure well controlled CMP Continue amlodipine 10 mg daily, hydrochlorothiazide 12.5 mg daily

## 2021-07-10 ENCOUNTER — Other Ambulatory Visit: Payer: Self-pay | Admitting: Internal Medicine

## 2021-07-18 ENCOUNTER — Other Ambulatory Visit: Payer: Self-pay | Admitting: Internal Medicine

## 2021-08-25 ENCOUNTER — Telehealth: Payer: Self-pay

## 2021-08-25 NOTE — Progress Notes (Signed)
° ° °  Chronic Care Management Pharmacy Assistant   Name: [name]  MRN: [id number] DOB: Dec 11, 1950   Reason for Encounter: Disease State-General     Recent office visits:  06/26/21 [name], MD-PCP (hypertension) Blood work was ordered.  Medications changes include :  none     Recent consult visits:  None ID  Hospital visits:  None in previous 6 months  Medications: Outpatient Encounter Medications as of 08/25/2021  Medication Sig   acetaminophen (TYLENOL) 500 MG tablet Take 1,000 mg by mouth every 8 (eight) hours as needed.   ALPRAZolam (XANAX) 0.25 MG tablet TAKE 1 TABLET(0.25 MG) BY MOUTH TWICE DAILY AS NEEDED FOR ANXIETY   amLODipine (NORVASC) 10 MG tablet TAKE 1 TABLET(10 MG) BY MOUTH DAILY   calcium citrate (CALCITRATE - DOSED IN MG ELEMENTAL CALCIUM) 950 (200 Ca) MG tablet Take 200 mg of elemental calcium by mouth 2 (two) times daily.   cetirizine (ZYRTEC) 10 MG tablet Take 10 mg by mouth daily.   cholecalciferol (VITAMIN D3) 25 MCG (1000 UT) tablet Take 1,000 Units by mouth 2 (two) times daily.   hydrochlorothiazide (HYDRODIURIL) 12.5 MG tablet TAKE 1 TABLET BY MOUTH EVERY DAY   ibuprofen (ADVIL) 200 MG tablet Take 200 mg by mouth every 8 (eight) hours as needed.   Ibuprofen-diphenhydrAMINE HCl 200-25 MG CAPS Take 1 tablet by mouth at bedtime as needed (pain/sleep).   methocarbamol (ROBAXIN) 500 MG tablet TAKE 1 TABLET(500 MG) BY MOUTH TWICE DAILY AS NEEDED FOR MUSCLE SPASMS   nitrofurantoin, macrocrystal-monohydrate, (MACROBID) 100 MG capsule TAKE 1 CAPSULE(100 MG) BY MOUTH DAILY   phenylephrine (SUDAFED PE) 10 MG TABS tablet Take 10 mg by mouth daily.   pravastatin (PRAVACHOL) 40 MG tablet TAKE 1 TABLET BY MOUTH DAILY   vitamin B-12 (CYANOCOBALAMIN) 1000 MCG tablet Take 1 tablet (1,000 mcg total) by mouth daily.   No facility-administered encounter medications on file as of 08/25/2021.   Have you had any problems recently with your health? Patient states  that she has not had any new health issues  Have you had any problems with your pharmacy?Patient states that she does not have any problems with getting medications or the cost of medications from the pharmacy  What issues or side effects are you having with your medications? Patient states that she does not have any side effects from any medications  What would you like me to pass along to [facility] for them to help you with? Patient states she is doing well  What can we do to take care of you better? Patient states she does not need anything at this time  Care Gaps: Colonoscopy-NA Diabetic Foot Exam-NA Mammogram-10/04/20 Ophthalmology-NA Dexa Scan - 10/02/20 Annual Well Visit - NA Micro albumin-NA Hemoglobin A1c- 06/26/21  Star Rating Drugs: Pravastatin 40 mg-last fill 07/11/21 90 ds  Ethelene Hal Clinical Pharmacist Assistant [phone]

## 2021-09-04 ENCOUNTER — Other Ambulatory Visit: Payer: Self-pay | Admitting: Internal Medicine

## 2021-09-30 ENCOUNTER — Other Ambulatory Visit: Payer: Self-pay | Admitting: Internal Medicine

## 2021-10-14 ENCOUNTER — Other Ambulatory Visit: Payer: Self-pay | Admitting: Internal Medicine

## 2021-11-04 ENCOUNTER — Telehealth: Payer: Self-pay | Admitting: Internal Medicine

## 2021-11-04 NOTE — Telephone Encounter (Signed)
LVM for pt to rtn my call to schedule AWV with [facility]. Please schedule AWV if pt calls the office  

## 2021-11-12 ENCOUNTER — Telehealth: Payer: Self-pay | Admitting: Internal Medicine

## 2021-11-24 ENCOUNTER — Ambulatory Visit (INDEPENDENT_AMBULATORY_CARE_PROVIDER_SITE_OTHER): Payer: Medicare Other

## 2021-11-24 DIAGNOSIS — Z0000 Encounter for general adult medical examination without abnormal findings: Secondary | ICD-10-CM | POA: Diagnosis not present

## 2021-11-24 NOTE — Progress Notes (Signed)
? ?Subjective:  ? [name] is a 71 y.o. female who presents for an Subsequent  Medicare Annual Wellness Visit. ? ? ?I connected with [name] today by telephone and verified that I am speaking with the correct person using two identifiers. ?Location patient: home ?Location provider: work ?Persons participating in the virtual visit: patient, provider. ?  ?I discussed the limitations, risks, security and privacy concerns of performing an evaluation and management service by telephone and the availability of in person appointments. I also discussed with the patient that there may be a patient responsible charge related to this service. The patient expressed understanding and verbally consented to this telephonic visit.  ?  ?Interactive audio and video telecommunications were attempted between this provider and patient, however failed, due to patient having technical difficulties OR patient did not have access to video capability.  We continued and completed visit with audio only. ? ?  ?Review of Systems    ? ?Cardiac Risk Factors include: advanced age (>62mn, >>18women) ? ?   ?Objective:  ?  ?Today's Vitals  ? ?There is no height or weight on file to calculate BMI. ? ?Advanced Directives 11/24/2021 11/06/2020 06/08/2019 08/11/2018 05/09/2015 05/19/2012 05/18/2012  ?Does Patient Have a Medical Advance Directive? Yes Yes Yes No No Patient does not have advance directive;Patient would not like information Patient does not have advance directive;Patient would not like information  ?Type of AParamedicof AHissopLiving will Living will;Healthcare Power of ABrowningLiving will - - - -  ?Does patient want to make changes to medical advance directive? - No - Patient declined - - - - -  ?Copy of HRichlandsin Chart? No - copy requested No - copy requested No - copy requested - - - -  ?Would patient like information on creating a medical advance directive?  - - - No - Patient declined No - patient declined information - -  ?Pre-existing out of facility DNR order (yellow form or pink MOST form) - - - - - - No  ? ? ?Current Medications (verified) ?Outpatient Encounter Medications as of 11/24/2021  ?Medication Sig  ? acetaminophen (TYLENOL) 500 MG tablet Take 1,000 mg by mouth every 8 (eight) hours as needed.  ? ALPRAZolam (XANAX) 0.25 MG tablet TAKE 1 TABLET(0.25 MG) BY MOUTH TWICE DAILY AS NEEDED FOR ANXIETY  ? amLODipine (NORVASC) 10 MG tablet TAKE 1 TABLET(10 MG) BY MOUTH DAILY  ? calcium citrate (CALCITRATE - DOSED IN MG ELEMENTAL CALCIUM) 950 (200 Ca) MG tablet Take 200 mg of elemental calcium by mouth 2 (two) times daily.  ? cetirizine (ZYRTEC) 10 MG tablet Take 10 mg by mouth daily.  ? cholecalciferol (VITAMIN D3) 25 MCG (1000 UT) tablet Take 1,000 Units by mouth 2 (two) times daily.  ? hydrochlorothiazide (HYDRODIURIL) 12.5 MG tablet TAKE 1 TABLET BY MOUTH EVERY DAY  ? ibuprofen (ADVIL) 200 MG tablet Take 200 mg by mouth every 8 (eight) hours as needed.  ? methocarbamol (ROBAXIN) 500 MG tablet TAKE 1 TABLET(500 MG) BY MOUTH TWICE DAILY AS NEEDED FOR MUSCLE SPASMS  ? nitrofurantoin, macrocrystal-monohydrate, (MACROBID) 100 MG capsule TAKE 1 CAPSULE(100 MG) BY MOUTH DAILY  ? phenylephrine (SUDAFED PE) 10 MG TABS tablet Take 10 mg by mouth daily.  ? pravastatin (PRAVACHOL) 40 MG tablet TAKE 1 TABLET BY MOUTH DAILY  ? vitamin B-12 (CYANOCOBALAMIN) 1000 MCG tablet Take 1 tablet (1,000 mcg total) by mouth daily.  ? Ibuprofen-diphenhydrAMINE HCl 200-25  MG CAPS Take 1 tablet by mouth at bedtime as needed (pain/sleep). (Patient not taking: Reported on 11/24/2021)  ? ?No facility-administered encounter medications on file as of 11/24/2021.  ? ? ?Allergies (verified) ?Fosamax [alendronate sodium], Hydrocodone-acetaminophen, Oxycodone-acetaminophen, and Tizanidine  ? ?History: ?Past Medical History:  ?Diagnosis Date  ? ALLERGIC RHINITIS   ? ANEMIA, PERNICIOUS   ? ANXIETY   ?  BILIARY DYSKINESIA   ? CARPAL TUNNEL SYNDROME, LEFT   ? DYSLIPIDEMIA   ? Headache(784.0)   ? HYPERTENSION   ? Irritable bowel syndrome   ? Non-celiac gluten sensitivity   ? OSTEOPENIA   ? 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[place])   ? borderline  ? ?Past Surgical History:  ?Procedure Laterality Date  ? APPENDECTOMY    ? CARPAL TUNNEL RELEASE    ? CESAREAN SECTION  76 & 85  ? x's 2   ? CHOLECYSTECTOMY  2005  ? Dr. Dalbert Batman (Gallbladder dyskinesia)  ? COLONOSCOPY    ? DILATION AND CURETTAGE OF UTERUS    ? LEFT HEART CATHETERIZATION WITH CORONARY ANGIOGRAM N/A 05/19/2012  ? Procedure: LEFT HEART CATHETERIZATION WITH CORONARY ANGIOGRAM;  Surgeon: Hillary Bow, MD;  Location: [facility] CATH LAB;  Service: Cardiovascular;  Laterality: N/A;  ? ROTATOR CUFF REPAIR    ? TONSILLECTOMY    ? UPPER GASTROINTESTINAL ENDOSCOPY    ? ?Family History  ?Problem Relation Age of Onset  ? Colitis Mother   ? Heart disease Mother   ? Hyperlipidemia Mother   ? Lung cancer Mother   ?     Cause of death  ? Hypertension Mother   ? Heart disease Brother   ? Valvular heart disease Brother   ? Heart attack Brother   ? Atrial fibrillation Brother   ? Heart failure Brother   ? Miscarriages / [place] Brother   ? Lung cancer Father   ? Leukemia Brother   ? Coronary artery disease Brother   ?     MI in late 7's. Still living  ? Ovarian cancer Maternal Aunt   ? Diabetes Maternal Aunt   ? Coronary artery disease Brother   ?     MI in 37's. Also still living.  ? Colon cancer Neg Hx   ? Colon polyps Neg Hx   ? ?Social History  ? ?Socioeconomic History  ? Marital status: Married  ?  Spouse name: Not on file  ? Number of children: Not on file  ? Years of education: Not on file  ? Highest education level: Not on file  ?Occupational History  ? Occupation: Dental office/ Retired  ?  Employer: [employer]  ?Tobacco Use  ? Smoking status: Never  ? Smokeless tobacco: Never  ?Vaping Use  ? Vaping Use: Never used  ?Substance and Sexual Activity  ? Alcohol use: Yes  ?   Alcohol/week: 1.0 standard drink  ?  Types: 1 Glasses of wine per week  ?  Comment: occassional  ? Drug use: No  ? Sexual activity: Yes  ?  Birth control/protection: Post-menopausal  ?Other Topics Concern  ? Not on file  ?Social History Narrative  ? Married and lives with husband.   ? Dental hygienist - retired  ? ?Social Determinants of Health  ? ?Financial Resource Strain: Low Risk   ? Difficulty of Paying Living Expenses: Not hard at all  ?Food Insecurity: No Food Insecurity  ? Worried About Charity fundraiser in the Last Year: Never true  ? Ran Out of Food in  the Last Year: Never true  ?Transportation Needs: No Transportation Needs  ? Lack of Transportation (Medical): No  ? Lack of Transportation (Non-Medical): No  ?Physical Activity: Sufficiently Active  ? Days of Exercise per Week: 6 days  ? Minutes of Exercise per Session: 60 min  ?Stress: No Stress Concern Present  ? Feeling of Stress : Not at all  ?Social Connections: Moderately Isolated  ? Frequency of Communication with Friends and Family: Three times a week  ? Frequency of Social Gatherings with Friends and Family: Three times a week  ? Attends Religious Services: Never  ? Active Member of Clubs or Organizations: No  ? Attends Archivist Meetings: Never  ? Marital Status: Married  ? ? ?Tobacco Counseling ?Counseling given: Not Answered ? ? ?Clinical Intake: ? ?Pre-visit preparation completed: Yes ? ?Pain : No/denies pain ? ?  ? ?Nutritional Risks: None ?Diabetes: No ? ?How often do you need to have someone help you when you read instructions, pamphlets, or other written materials from your doctor or pharmacy?: 1 - Never ?What is the last grade level you completed in school?: college ? ?Diabetic?no ? ?  ? ?Information entered by :: [email or web address] ? ? ?Activities of Daily Living ?In your present state of health, do you have any difficulty performing the following activities: 11/24/2021  ?Hearing? N  ?Vision? N  ?Difficulty concentrating or making  decisions? N  ?Walking or climbing stairs? N  ?Dressing or bathing? N  ?Doing errands, shopping? N  ?Preparing Food and eating ? N  ?Using the Toilet? N  ?In the past six months, have you accidently leak

## 2021-11-24 NOTE — Patient Instructions (Signed)
Ms. Ainley , ?Thank you for taking time to come for your Medicare Wellness Visit. I appreciate your ongoing commitment to your health goals. Please review the following plan we discussed and let me know if I can assist you in the future.  ? ?Screening recommendations/referrals: ?Colonoscopy: 05/23/2015 ?Mammogram: 10/04/2020 ?Bone Density: 10/02/2020 ?Recommended yearly ophthalmology/optometry visit for glaucoma screening and checkup ?Recommended yearly dental visit for hygiene and checkup ? ?Vaccinations: ?Influenza vaccine: completed  ?Pneumococcal vaccine: completed  ?Tdap vaccine: 11/08/2015 ?Shingles vaccine: completed    ? ?Advanced directives: yes  ? ?Conditions/risks identified: none  ? ?Next appointment: none  ? ? ?Preventive Care 18 Years and Older, Female ?Preventive care refers to lifestyle choices and visits with your health care provider that can promote health and wellness. ?What does preventive care include? ?A yearly physical exam. This is also called an annual well check. ?Dental exams once or twice a year. ?Routine eye exams. Ask your health care provider how often you should have your eyes checked. ?Personal lifestyle choices, including: ?Daily care of your teeth and gums. ?Regular physical activity. ?Eating a healthy diet. ?Avoiding tobacco and drug use. ?Limiting alcohol use. ?Practicing safe sex. ?Taking low-dose aspirin every day. ?Taking vitamin and mineral supplements as recommended by your health care provider. ?What happens during an annual well check? ?The services and screenings done by your health care provider during your annual well check will depend on your age, overall health, lifestyle risk factors, and family history of disease. ?Counseling  ?Your health care provider may ask you questions about your: ?Alcohol use. ?Tobacco use. ?Drug use. ?Emotional well-being. ?Home and relationship well-being. ?Sexual activity. ?Eating habits. ?History of falls. ?Memory and ability to understand  (cognition). ?Work and work Statistician. ?Reproductive health. ?Screening  ?You may have the following tests or measurements: ?Height, weight, and BMI. ?Blood pressure. ?Lipid and cholesterol levels. These may be checked every 5 years, or more frequently if you are over 77 years old. ?Skin check. ?Lung cancer screening. You may have this screening every year starting at age 71 if you have a 30-pack-year history of smoking and currently smoke or have quit within the past 15 years. ?Fecal occult blood test (FOBT) of the stool. You may have this test every year starting at age 27. ?Flexible sigmoidoscopy or colonoscopy. You may have a sigmoidoscopy every 5 years or a colonoscopy every 10 years starting at age 5. ?Hepatitis C blood test. ?Hepatitis B blood test. ?Sexually transmitted disease (STD) testing. ?Diabetes screening. This is done by checking your blood sugar (glucose) after you have not eaten for a while (fasting). You may have this done every 1-3 years. ?Bone density scan. This is done to screen for osteoporosis. You may have this done starting at age 61. ?Mammogram. This may be done every 1-2 years. Talk to your health care provider about how often you should have regular mammograms. ?Talk with your health care provider about your test results, treatment options, and if necessary, the need for more tests. ?Vaccines  ?Your health care provider may recommend certain vaccines, such as: ?Influenza vaccine. This is recommended every year. ?Tetanus, diphtheria, and acellular pertussis (Tdap, Td) vaccine. You may need a Td booster every 10 years. ?Zoster vaccine. You may need this after age 37. ?Pneumococcal 13-valent conjugate (PCV13) vaccine. One dose is recommended after age 60. ?Pneumococcal polysaccharide (PPSV23) vaccine. One dose is recommended after age 7. ?Talk to your health care provider about which screenings and vaccines you need and how often you  need them. ?This information is not intended to  replace advice given to you by your health care provider. Make sure you discuss any questions you have with your health care provider. ?Document Released: 09/20/2015 Document Revised: 05/13/2016 Document Reviewed: 06/25/2015 ?Elsevier Interactive Patient Education ? 2017 [place]. ? ?Fall Prevention in the Home ?Falls can cause injuries. They can happen to people of all ages. There are many things you can do to make your home safe and to help prevent falls. ?What can I do on the outside of my home? ?Regularly fix the edges of walkways and driveways and fix any cracks. ?Remove anything that might make you trip as you walk through a door, such as a raised step or threshold. ?Trim any bushes or trees on the path to your home. ?Use bright outdoor lighting. ?Clear any walking paths of anything that might make someone trip, such as rocks or tools. ?Regularly check to see if handrails are loose or broken. Make sure that both sides of any steps have handrails. ?Any raised decks and porches should have guardrails on the edges. ?Have any leaves, snow, or ice cleared regularly. ?Use sand or salt on walking paths during winter. ?Clean up any spills in your garage right away. This includes oil or grease spills. ?What can I do in the bathroom? ?Use night lights. ?Install grab bars by the toilet and in the tub and shower. Do not use towel bars as grab bars. ?Use non-skid mats or decals in the tub or shower. ?If you need to sit down in the shower, use a plastic, non-slip stool. ?Keep the floor dry. Clean up any water that spills on the floor as soon as it happens. ?Remove soap buildup in the tub or shower regularly. ?Attach bath mats securely with double-sided non-slip rug tape. ?Do not have throw rugs and other things on the floor that can make you trip. ?What can I do in the bedroom? ?Use night lights. ?Make sure that you have a light by your bed that is easy to reach. ?Do not use any sheets or blankets that are too big for  your bed. They should not hang down onto the floor. ?Have a firm chair that has side arms. You can use this for support while you get dressed. ?Do not have throw rugs and other things on the floor that can make you trip. ?What can I do in the kitchen? ?Clean up any spills right away. ?Avoid walking on wet floors. ?Keep items that you use a lot in easy-to-reach places. ?If you need to reach something above you, use a strong step stool that has a grab bar. ?Keep electrical cords out of the way. ?Do not use floor polish or wax that makes floors slippery. If you must use wax, use non-skid floor wax. ?Do not have throw rugs and other things on the floor that can make you trip. ?What can I do with my stairs? ?Do not leave any items on the stairs. ?Make sure that there are handrails on both sides of the stairs and use them. Fix handrails that are broken or loose. Make sure that handrails are as long as the stairways. ?Check any carpeting to make sure that it is firmly attached to the stairs. Fix any carpet that is loose or worn. ?Avoid having throw rugs at the top or bottom of the stairs. If you do have throw rugs, attach them to the floor with carpet tape. ?Make sure that you have a light switch  at the top of the stairs and the bottom of the stairs. If you do not have them, ask someone to add them for you. ?What else can I do to help prevent falls? ?Wear shoes that: ?Do not have high heels. ?Have rubber bottoms. ?Are comfortable and fit you well. ?Are closed at the toe. Do not wear sandals. ?If you use a stepladder: ?Make sure that it is fully opened. Do not climb a closed stepladder. ?Make sure that both sides of the stepladder are locked into place. ?Ask someone to hold it for you, if possible. ?Clearly mark and make sure that you can see: ?Any grab bars or handrails. ?First and last steps. ?Where the edge of each step is. ?Use tools that help you move around (mobility aids) if they are needed. These  include: ?Canes. ?Walkers. ?Scooters. ?Crutches. ?Turn on the lights when you go into a dark area. Replace any light bulbs as soon as they burn out. ?Set up your furniture so you have a clear path. Avoid moving your furnitur

## 2021-12-22 DIAGNOSIS — N952 Postmenopausal atrophic vaginitis: Secondary | ICD-10-CM | POA: Diagnosis not present

## 2021-12-22 DIAGNOSIS — Z01419 Encounter for gynecological examination (general) (routine) without abnormal findings: Secondary | ICD-10-CM | POA: Diagnosis not present

## 2021-12-22 DIAGNOSIS — Z6824 Body mass index (BMI) 24.0-24.9, adult: Secondary | ICD-10-CM | POA: Diagnosis not present

## 2021-12-22 DIAGNOSIS — Z1231 Encounter for screening mammogram for malignant neoplasm of breast: Secondary | ICD-10-CM | POA: Diagnosis not present

## 2021-12-25 ENCOUNTER — Encounter: Payer: Self-pay | Admitting: Internal Medicine

## 2021-12-25 NOTE — Progress Notes (Signed)
? ? ? ? ?Subjective:  ? ? Patient ID: [name], female    DOB: [birth year], 71 y.o.   MRN: [id number] ? ?This visit occurred during the SARS-CoV-2 public health emergency.  Safety protocols were in place, including screening questions prior to the visit, additional usage of staff PPE, and extensive cleaning of exam room while observing appropriate contact time as indicated for disinfecting solutions.   ? ? ?HPI ?[name] is here for follow up of her chronic medical problems, including htn, hld, anxiety, OP, recurrent UTI ? ?She is exercising regularly.   She walks.  Doing tai chi ? ?Left shoulder catches when she reaches in certain directions.  ? ?Headaches - related to allergies.   ? ? ? ?Medications and allergies reviewed with patient and updated if appropriate. ? ?Current Outpatient Medications on File Prior to Visit  ?Medication Sig Dispense Refill  ? acetaminophen (TYLENOL) 500 MG tablet Take 1,000 mg by mouth every 8 (eight) hours as needed.    ? ALPRAZolam (XANAX) 0.25 MG tablet TAKE 1 TABLET(0.25 MG) BY MOUTH TWICE DAILY AS NEEDED FOR ANXIETY 60 tablet 0  ? amLODipine (NORVASC) 10 MG tablet TAKE 1 TABLET(10 MG) BY MOUTH DAILY 90 tablet 1  ? calcium citrate (CALCITRATE - DOSED IN MG ELEMENTAL CALCIUM) 950 (200 Ca) MG tablet Take 200 mg of elemental calcium by mouth 2 (two) times daily.    ? cetirizine (ZYRTEC) 10 MG tablet Take 10 mg by mouth daily.    ? cholecalciferol (VITAMIN D3) 25 MCG (1000 UT) tablet Take 1,000 Units by mouth 2 (two) times daily.    ? hydrochlorothiazide (HYDRODIURIL) 12.5 MG tablet TAKE 1 TABLET BY MOUTH EVERY DAY 90 tablet 1  ? ibuprofen (ADVIL) 200 MG tablet Take 200 mg by mouth every 8 (eight) hours as needed.    ? methocarbamol (ROBAXIN) 500 MG tablet TAKE 1 TABLET(500 MG) BY MOUTH TWICE DAILY AS NEEDED FOR MUSCLE SPASMS 30 tablet 0  ? nitrofurantoin, macrocrystal-monohydrate, (MACROBID) 100 MG capsule TAKE 1 CAPSULE(100 MG) BY MOUTH DAILY 90 capsule 1  ? phenylephrine (SUDAFED  PE) 10 MG TABS tablet Take 10 mg by mouth daily.    ? pravastatin (PRAVACHOL) 40 MG tablet TAKE 1 TABLET BY MOUTH DAILY 90 tablet 0  ? vitamin B-12 (CYANOCOBALAMIN) 1000 MCG tablet Take 1 tablet (1,000 mcg total) by mouth daily.    ? ?No current facility-administered medications on file prior to visit.  ? ? ? ?Review of Systems  ?Constitutional:  Negative for fever.  ?Respiratory:  Negative for cough, shortness of breath and wheezing.   ?Cardiovascular:  Negative for chest pain, palpitations and leg swelling (when it is hot out).  ?Genitourinary:  Negative for dysuria and hematuria.  ?Neurological:  Positive for headaches (from allergies). Negative for light-headedness.  ? ?   ?Objective:  ? ?Vitals:  ? 12/26/21 0942  ?BP: 132/72  ?Pulse: (!) 58  ?Temp: 98 ?F (36.7 ?C)  ?SpO2: 98%  ? ?BP Readings from Last 3 Encounters:  ?12/26/21 132/72  ?06/26/21 140/70  ?12/16/20 (!) 144/70  ? ?Wt Readings from Last 3 Encounters:  ?12/26/21 115 lb 3.2 oz (52.3 kg)  ?06/26/21 113 lb (51.3 kg)  ?12/16/20 111 lb (50.3 kg)  ? ?Body mass index is 22.5 kg/m?. ? ?  ?Physical Exam ?Constitutional:   ?   General: She is not in acute distress. ?   Appearance: Normal appearance.  ?HENT:  ?   Head: Normocephalic and atraumatic.  ?Eyes:  ?  Conjunctiva/sclera: Conjunctivae normal.  ?Cardiovascular:  ?   Rate and Rhythm: Normal rate and regular rhythm.  ?   Heart sounds: Normal heart sounds. No murmur heard. ?Pulmonary:  ?   Effort: Pulmonary effort is normal. No respiratory distress.  ?   Breath sounds: Normal breath sounds. No wheezing.  ?Musculoskeletal:  ?   Cervical back: Neck supple.  ?   Right lower leg: No edema.  ?   Left lower leg: No edema.  ?Lymphadenopathy:  ?   Cervical: No cervical adenopathy.  ?Skin: ?   General: Skin is warm and dry.  ?   Findings: No rash.  ?Neurological:  ?   Mental Status: She is alert. Mental status is at baseline.  ?Psychiatric:     ?   Mood and Affect: Mood normal.     ?   Behavior: Behavior normal.   ? ?   ? ?Lab Results  ?Component Value Date  ? WBC 4.2 06/27/2020  ? HGB 13.5 06/27/2020  ? HCT 39.2 06/27/2020  ? PLT 227.0 06/27/2020  ? GLUCOSE 102 (H) 06/26/2021  ? CHOL 225 (H) 06/26/2021  ? TRIG 67.0 06/26/2021  ? HDL 116.80 06/26/2021  ? LDLDIRECT 87.0 09/16/2012  ? [place] 95 06/26/2021  ? ALT 22 06/26/2021  ? AST 23 06/26/2021  ? NA 136 06/26/2021  ? K 4.1 06/26/2021  ? CL 97 06/26/2021  ? CREATININE 0.66 06/26/2021  ? BUN 8 06/26/2021  ? CO2 29 06/26/2021  ? TSH 1.67 06/08/2019  ? INR 1.09 05/19/2012  ? HGBA1C 5.1 06/26/2021  ? ? ? ?Assessment & Plan:  ? ? ?See Problem List for Assessment and Plan of chronic medical problems.  ? ? ?

## 2021-12-25 NOTE — Patient Instructions (Addendum)
? ? ? ?  Blood work was ordered.   ? ? ?Medications changes include :   none ? ? ?Your prescription(s) have been sent to your pharmacy.  ? ? ? ? ?Return in about 6 months (around 06/27/2022) for follow up. ? ?

## 2021-12-26 ENCOUNTER — Ambulatory Visit (INDEPENDENT_AMBULATORY_CARE_PROVIDER_SITE_OTHER): Payer: Medicare Other | Admitting: Internal Medicine

## 2021-12-26 ENCOUNTER — Encounter: Payer: Self-pay | Admitting: Internal Medicine

## 2021-12-26 VITALS — BP 132/72 | HR 58 | Temp 98.0°F | Ht 60.0 in | Wt 115.2 lb

## 2021-12-26 DIAGNOSIS — F411 Generalized anxiety disorder: Secondary | ICD-10-CM | POA: Diagnosis not present

## 2021-12-26 DIAGNOSIS — N390 Urinary tract infection, site not specified: Secondary | ICD-10-CM | POA: Diagnosis not present

## 2021-12-26 DIAGNOSIS — E785 Hyperlipidemia, unspecified: Secondary | ICD-10-CM | POA: Diagnosis not present

## 2021-12-26 DIAGNOSIS — M810 Age-related osteoporosis without current pathological fracture: Secondary | ICD-10-CM

## 2021-12-26 DIAGNOSIS — M542 Cervicalgia: Secondary | ICD-10-CM

## 2021-12-26 DIAGNOSIS — I10 Essential (primary) hypertension: Secondary | ICD-10-CM

## 2021-12-26 LAB — COMPREHENSIVE METABOLIC PANEL
ALT: 20 U/L (ref 0–35)
AST: 24 U/L (ref 0–37)
Albumin: 5.2 g/dL (ref 3.5–5.2)
Alkaline Phosphatase: 62 U/L (ref 39–117)
BUN: 6 mg/dL (ref 6–23)
CO2: 28 mEq/L (ref 19–32)
Calcium: 10 mg/dL (ref 8.4–10.5)
Chloride: 95 mEq/L — ABNORMAL LOW (ref 96–112)
Creatinine, Ser: 0.67 mg/dL (ref 0.40–1.20)
GFR: 89.61 mL/min (ref >60.00)
Glucose, Bld: 101 mg/dL — ABNORMAL HIGH (ref 70–99)
Potassium: 3.7 mEq/L (ref 3.5–5.1)
Sodium: 133 mEq/L — ABNORMAL LOW (ref 135–145)
Total Bilirubin: 0.7 mg/dL (ref 0.2–1.2)
Total Protein: 7.3 g/dL (ref 6.0–8.3)

## 2021-12-26 LAB — LIPID PANEL
Cholesterol: 200 mg/dL (ref 0–200)
HDL: 94.1 mg/dL (ref >39.00)
LDL Cholesterol: 92 mg/dL (ref 0–99)
NonHDL: 105.82
Total CHOL/HDL Ratio: 2
Triglycerides: 70 mg/dL (ref 0.0–149.0)
VLDL: 14 mg/dL (ref 0.0–40.0)

## 2021-12-26 LAB — VITAMIN D 25 HYDROXY (VIT D DEFICIENCY, FRACTURES): VITD: 40.72 ng/mL (ref 30.00–100.00)

## 2021-12-26 MED ORDER — NITROFURANTOIN MONOHYD MACRO 100 MG PO CAPS: ORAL_CAPSULE | ORAL | 1 refills | Status: DC

## 2021-12-26 MED ORDER — AMLODIPINE BESYLATE 10 MG PO TABS: ORAL_TABLET | ORAL | 1 refills | Status: DC

## 2021-12-26 MED ORDER — HYDROCHLOROTHIAZIDE 12.5 MG PO TABS: 12.5000 mg | ORAL_TABLET | Freq: Every day | ORAL | 1 refills | Status: DC

## 2021-12-26 MED ORDER — PRAVASTATIN SODIUM 40 MG PO TABS: 40.0000 mg | ORAL_TABLET | Freq: Every day | ORAL | 2 refills | Status: DC

## 2021-12-26 NOTE — Assessment & Plan Note (Signed)
Chronic ?Blood pressure well controlled ?CMP ?Continue amlodipine 10 mg daily, HCTZ 12.5 mg daily ?

## 2021-12-26 NOTE — Assessment & Plan Note (Signed)
Chronic Check lipid panel  Continue pravastatin 40 mg daily Regular exercise and healthy diet encouraged  

## 2021-12-26 NOTE — Assessment & Plan Note (Addendum)
Chronic ?Controlled, stable ?Continue xanax 0.25 mg bid prn -  - rarely takes it ? ?

## 2021-12-26 NOTE — Assessment & Plan Note (Signed)
Chronic ?Intermittent ?Continue methocarbamol prn ?advil prn ?Uses heat ?

## 2021-12-26 NOTE — Assessment & Plan Note (Signed)
Chronic ?Controlled, stable - no recent uti's ?Continue macrobid 100 mg daily ? ?

## 2021-12-26 NOTE — Assessment & Plan Note (Addendum)
Chronic ?DEXA through GYN-up-to-date, done in 2022 ?Continue regular walking ?Continue calcium and vitamin D daily ?Check vitamin D level ?Not on any medication ?

## 2021-12-28 ENCOUNTER — Other Ambulatory Visit: Payer: Self-pay | Admitting: Internal Medicine

## 2021-12-30 ENCOUNTER — Encounter: Payer: Self-pay | Admitting: Internal Medicine

## 2022-01-05 ENCOUNTER — Telehealth: Payer: Self-pay

## 2022-01-05 NOTE — Progress Notes (Signed)
? ? ?  Chronic Care Management ?Pharmacy Assistant  ? ?Name: AISHA GREENBERGER  MRN: [id number] DOB: Nov 02, 1950 ? ? ? ?Reason for Encounter: Disease State-General Adherence call ?  ? ?Recent office visits:  ?12/26/21 [name], MD-PCP (Essential Hypertension)   ?Blood work was ordered.  Medications changes include : none ? ?Recent consult visits:  ?None since coordination call ? ?Hospital visits:  ?None since coordination call ? ?Medications: ?Outpatient Encounter Medications as of 01/05/2022  ?Medication Sig  ? acetaminophen (TYLENOL) 500 MG tablet Take 1,000 mg by mouth every 8 (eight) hours as needed.  ? ALPRAZolam (XANAX) 0.25 MG tablet TAKE 1 TABLET(0.25 MG) BY MOUTH TWICE DAILY AS NEEDED FOR ANXIETY  ? amLODipine (NORVASC) 10 MG tablet TAKE 1 TABLET(10 MG) BY MOUTH DAILY  ? calcium citrate (CALCITRATE - DOSED IN MG ELEMENTAL CALCIUM) 950 (200 Ca) MG tablet Take 200 mg of elemental calcium by mouth 2 (two) times daily.  ? cetirizine (ZYRTEC) 10 MG tablet Take 10 mg by mouth daily.  ? cholecalciferol (VITAMIN D3) 25 MCG (1000 UT) tablet Take 1,000 Units by mouth 2 (two) times daily.  ? hydrochlorothiazide (HYDRODIURIL) 12.5 MG tablet Take 1 tablet (12.5 mg total) by mouth daily.  ? ibuprofen (ADVIL) 200 MG tablet Take 200 mg by mouth every 8 (eight) hours as needed.  ? methocarbamol (ROBAXIN) 500 MG tablet TAKE 1 TABLET(500 MG) BY MOUTH TWICE DAILY AS NEEDED FOR MUSCLE SPASMS  ? nitrofurantoin, macrocrystal-monohydrate, (MACROBID) 100 MG capsule TAKE 1 CAPSULE(100 MG) BY MOUTH DAILY  ? phenylephrine (SUDAFED PE) 10 MG TABS tablet Take 10 mg by mouth daily.  ? pravastatin (PRAVACHOL) 40 MG tablet Take 1 tablet (40 mg total) by mouth daily.  ? vitamin B-12 (CYANOCOBALAMIN) 1000 MCG tablet Take 1 tablet (1,000 mcg total) by mouth daily.  ? ?No facility-administered encounter medications on file as of 01/05/2022.  ? ?3 attempts made to contacted [name] for General Review Call. Unable to reach patient, left  messages for patient to return call. ? ? ?Chart Review: ? ?Have there been any documented new, changed, or discontinued medications since last visit? No (If yes, include name, dose, frequency, date) ?Has there been any documented recent hospitalizations or ED visits since last visit with Clinical Pharmacist? No ?Brief Summary (including medication and/or Diagnosis changes): ? ? ?Adherence Review: ? ?Does the Clinical Pharmacist Assistant have access to adherence rates? Yes ?Adherence rates for STAR metric medications (List medication(s)/day supply/ last 2 fill dates). ?Adherence rates for medications indicated for disease state being reviewed (List medication(s)/day supply/ last 2 fill dates). ?Does the patient have >5 day gap between last estimated fill dates for any of the above medications or other medication gaps? No ?Reason for medication gaps. ? ?   ?Care Gaps: ?Colonoscopy-05/23/15 ?Diabetic Foot Exam-NA ?Mammogram-10/04/20 ?Ophthalmology-NA ?Dexa Scan - 10/02/20 ?Annual Well Visit - NA ?Micro albumin-NA ?Hemoglobin A1c- 06/26/21 ? ?Star Rating Drugs: ?Pravastatin 40 mg-last fill 10/14/21 90 ds ? ?Ethelene Hal ?Clinical Pharmacist Assistant ?[phone]  ?

## 2022-03-04 ENCOUNTER — Other Ambulatory Visit: Payer: Self-pay | Admitting: Internal Medicine

## 2022-03-25 ENCOUNTER — Encounter: Payer: Self-pay | Admitting: Internal Medicine

## 2022-03-25 NOTE — Patient Instructions (Addendum)
Take the antibiotic as prescribed.  Take tylenol if needed.     Increase your water intake.   Call if no improvement     Urinary Tract Infection, Adult A urinary tract infection (UTI) is an infection of any part of the urinary tract, which includes the kidneys, ureters, bladder, and urethra. These organs make, store, and get rid of urine in the body. UTI can be a bladder infection (cystitis) or kidney infection (pyelonephritis). What are the causes? This infection may be caused by fungi, viruses, or bacteria. Bacteria are the most common cause of UTIs. This condition can also be caused by repeated incomplete emptying of the bladder during urination. What increases the risk? This condition is more likely to develop if:  You ignore your need to urinate or hold urine for long periods of time.  You do not empty your bladder completely during urination.  You wipe back to front after urinating or having a bowel movement, if you are female.  You are uncircumcised, if you are female.  You are constipated.  You have a urinary catheter that stays in place (indwelling).  You have a weak defense (immune) system.  You have a medical condition that affects your bowels, kidneys, or bladder.  You have diabetes.  You take antibiotic medicines frequently or for long periods of time, and the antibiotics no longer work well against certain types of infections (antibiotic resistance).  You take medicines that irritate your urinary tract.  You are exposed to chemicals that irritate your urinary tract.  You are female.  What are the signs or symptoms? Symptoms of this condition include:  Fever.  Frequent urination or passing small amounts of urine frequently.  Needing to urinate urgently.  Pain or burning with urination.  Urine that smells bad or unusual.  Cloudy urine.  Pain in the lower abdomen or back.  Trouble urinating.  Blood in the urine.  Vomiting or being less hungry than  normal.  Diarrhea or abdominal pain.  Vaginal discharge, if you are female.  How is this diagnosed? This condition is diagnosed with a medical history and physical exam. You will also need to provide a urine sample to test your urine. Other tests may be done, including:  Blood tests.  Sexually transmitted disease (STD) testing.  If you have had more than one UTI, a cystoscopy or imaging studies may be done to determine the cause of the infections. How is this treated? Treatment for this condition often includes a combination of two or more of the following:  Antibiotic medicine.  Other medicines to treat less common causes of UTI.  Over-the-counter medicines to treat pain.  Drinking enough water to stay hydrated.  Follow these instructions at home:  Take over-the-counter and prescription medicines only as told by your health care provider.  If you were prescribed an antibiotic, take it as told by your health care provider. Do not stop taking the antibiotic even if you start to feel better.  Avoid alcohol, caffeine, tea, and carbonated beverages. They can irritate your bladder.  Drink enough fluid to keep your urine clear or pale yellow.  Keep all follow-up visits as told by your health care provider. This is important.  Make sure to: ? Empty your bladder often and completely. Do not hold urine for long periods of time. ? Empty your bladder before and after sex. ? Wipe from front to back after a bowel movement if you are female. Use each tissue one time when you   wipe. Contact a health care provider if:  You have back pain.  You have a fever.  You feel nauseous or vomit.  Your symptoms do not get better after 3 days.  Your symptoms go away and then return. Get help right away if:  You have severe back pain or lower abdominal pain.  You are vomiting and cannot keep down any medicines or water. This information is not intended to replace advice given to you by  your health care provider. Make sure you discuss any questions you have with your health care provider. Document Released: 06/03/2005 Document Revised: 02/05/2016 Document Reviewed: 07/15/2015 Elsevier Interactive Patient Education  2018 [employer]   

## 2022-03-25 NOTE — Progress Notes (Signed)
Ct    Subjective:    Patient ID: [name], female    DOB: 03-25-1951, 71 y.o.   MRN: [id number]      HPI [name] is here for  Chief Complaint  Patient presents with   Urinary Tract Infection    Blood in urine this morning; frequency and burning     ? UTI:  Her symptoms started  4 days ago.  She states dysuria, urinary frequency, urinary urgency, hematuria this morning, lower abdominal tenderness, headache this morning, achy back pain.  She had chills two night ago.  She had nausea  little.  She denies fever.   She takes nitrofurantoin on a daily basis for prevention, which has been very effective for her-100 mg daily - a few days she did take it twice daily without improvement.   Last documented UTI in our system was 11/2020-Pseudomonas aeruginosa-sensitive to Cipro, ceftazidime   Medications and allergies reviewed with patient and updated if appropriate.  Current Outpatient Medications on File Prior to Visit  Medication Sig Dispense Refill   acetaminophen (TYLENOL) 500 MG tablet Take 1,000 mg by mouth every 8 (eight) hours as needed.     ALPRAZolam (XANAX) 0.25 MG tablet TAKE 1 TABLET(0.25 MG) BY MOUTH TWICE DAILY AS NEEDED FOR ANXIETY 60 tablet 0   amLODipine (NORVASC) 10 MG tablet TAKE 1 TABLET(10 MG) BY MOUTH DAILY 90 tablet 1   calcium citrate (CALCITRATE - DOSED IN MG ELEMENTAL CALCIUM) 950 (200 Ca) MG tablet Take 200 mg of elemental calcium by mouth 2 (two) times daily.     cetirizine (ZYRTEC) 10 MG tablet Take 10 mg by mouth daily.     cholecalciferol (VITAMIN D3) 25 MCG (1000 UT) tablet Take 1,000 Units by mouth 2 (two) times daily.     hydrochlorothiazide (HYDRODIURIL) 12.5 MG tablet TAKE 1 TABLET BY MOUTH EVERY DAY 90 tablet 1   ibuprofen (ADVIL) 200 MG tablet Take 200 mg by mouth every 8 (eight) hours as needed.     methocarbamol (ROBAXIN) 500 MG tablet TAKE 1 TABLET(500 MG) BY MOUTH TWICE DAILY AS NEEDED FOR MUSCLE SPASMS 30 tablet 0   nitrofurantoin,  macrocrystal-monohydrate, (MACROBID) 100 MG capsule TAKE 1 CAPSULE(100 MG) BY MOUTH DAILY 90 capsule 1   phenylephrine (SUDAFED PE) 10 MG TABS tablet Take 10 mg by mouth daily.     pravastatin (PRAVACHOL) 40 MG tablet Take 1 tablet (40 mg total) by mouth daily. 90 tablet 2   vitamin B-12 (CYANOCOBALAMIN) 1000 MCG tablet Take 1 tablet (1,000 mcg total) by mouth daily.     No current facility-administered medications on file prior to visit.    Review of Systems     Objective:   Vitals:   03/26/22 0915  BP: 134/72  Pulse: 70  Temp: 98.2 F (36.8 C)  SpO2: 98%   BP Readings from Last 3 Encounters:  03/26/22 134/72  12/26/21 132/72  06/26/21 140/70   Wt Readings from Last 3 Encounters:  03/26/22 115 lb 9.6 oz (52.4 kg)  12/26/21 115 lb 3.2 oz (52.3 kg)  06/26/21 113 lb (51.3 kg)   Body mass index is 22.58 kg/m.    Physical Exam Constitutional:      General: She is not in acute distress.    Appearance: Normal appearance. She is not ill-appearing.  HENT:     Head: Normocephalic.  Eyes:     Conjunctiva/sclera: Conjunctivae normal.  Abdominal:     General: There is no distension.     Palpations:  Abdomen is soft.     Tenderness: There is abdominal tenderness (lower abdomen). There is no right CVA tenderness, left CVA tenderness, guarding or rebound.  Skin:    General: Skin is warm and dry.  Neurological:     Mental Status: She is alert.            Assessment & Plan:    See Problem List for Assessment and Plan of chronic medical problems.    At a previous visit we had discussed screening for heart disease and she would like to pursue that-CT coronary calcium scan ordered

## 2022-03-26 ENCOUNTER — Ambulatory Visit: Payer: Medicare Other | Admitting: Emergency Medicine

## 2022-03-26 ENCOUNTER — Ambulatory Visit (INDEPENDENT_AMBULATORY_CARE_PROVIDER_SITE_OTHER): Payer: Medicare Other | Admitting: Internal Medicine

## 2022-03-26 ENCOUNTER — Other Ambulatory Visit: Payer: Self-pay

## 2022-03-26 ENCOUNTER — Encounter: Payer: Self-pay | Admitting: Internal Medicine

## 2022-03-26 VITALS — BP 134/72 | HR 70 | Temp 98.2°F | Ht 60.0 in | Wt 115.6 lb

## 2022-03-26 DIAGNOSIS — N3001 Acute cystitis with hematuria: Secondary | ICD-10-CM

## 2022-03-26 DIAGNOSIS — N3000 Acute cystitis without hematuria: Secondary | ICD-10-CM | POA: Insufficient documentation

## 2022-03-26 DIAGNOSIS — Z136 Encounter for screening for cardiovascular disorders: Secondary | ICD-10-CM | POA: Insufficient documentation

## 2022-03-26 DIAGNOSIS — R300 Dysuria: Secondary | ICD-10-CM | POA: Diagnosis not present

## 2022-03-26 DIAGNOSIS — I10 Essential (primary) hypertension: Secondary | ICD-10-CM

## 2022-03-26 DIAGNOSIS — I119 Hypertensive heart disease without heart failure: Secondary | ICD-10-CM

## 2022-03-26 LAB — POC URINALSYSI DIPSTICK (AUTOMATED)
Bilirubin, UA: NEGATIVE
Glucose, UA: NEGATIVE
Ketones, UA: NEGATIVE
Nitrite, UA: NEGATIVE
Protein, UA: POSITIVE — AB
Specific Gravity: 1.01
Urobilinogen, UA: NEGATIVE E.U./dL — AB
pH, UA: 7.5 (ref 5.0–8.0)

## 2022-03-26 MED ORDER — CIPROFLOXACIN HCL 250 MG PO TABS: 250.0000 mg | ORAL_TABLET | Freq: Two times a day (BID) | ORAL | 0 refills | Status: DC

## 2022-03-26 NOTE — Assessment & Plan Note (Signed)
Interested in heart disease screening which we had discussed in past - Ct coronary calcium score ordrede

## 2022-03-26 NOTE — Assessment & Plan Note (Signed)
Acute Urine dip consistent with UTI Will send urine for culture Take the antibiotic as prescribed.  cipro 250 mg bid x 5 days   Take tylenol if needed.   Increase your water intake.  Call if no improvement

## 2022-03-29 LAB — CULTURE, URINE COMPREHENSIVE

## 2022-03-30 MED ORDER — CIPROFLOXACIN HCL 250 MG PO TABS
250.0000 mg | ORAL_TABLET | Freq: Two times a day (BID) | ORAL | 0 refills | Status: AC
Start: 2022-03-30 — End: 2022-04-01

## 2022-03-31 ENCOUNTER — Telehealth: Payer: Self-pay | Admitting: Internal Medicine

## 2022-03-31 ENCOUNTER — Encounter: Payer: Self-pay | Admitting: Internal Medicine

## 2022-03-31 NOTE — Telephone Encounter (Signed)
Pt is requesting a callback. Pt stated she is being treated for a bladder infection. She said she woke up 03/30/22 and she said she thinks she has an eye infection in her right eye. Eye is puffy and red with a little drainage. She was wanting to know if it is a side effect of her body fighting off her bladder infection.   Please advise

## 2022-03-31 NOTE — Telephone Encounter (Signed)
This is not related to the bladder infection.  May need to be seen to be evaluated.

## 2022-04-01 ENCOUNTER — Other Ambulatory Visit: Payer: Self-pay | Admitting: Internal Medicine

## 2022-04-01 ENCOUNTER — Encounter: Payer: Self-pay | Admitting: Internal Medicine

## 2022-04-01 MED ORDER — GABAPENTIN 100 MG PO CAPS: 200.0000 mg | ORAL_CAPSULE | Freq: Every day | ORAL | 3 refills | Status: DC

## 2022-04-01 NOTE — Telephone Encounter (Signed)
Meds sent

## 2022-04-01 NOTE — Telephone Encounter (Signed)
Spoke with patient  today.  Eye is doing much better.

## 2022-04-02 ENCOUNTER — Other Ambulatory Visit (INDEPENDENT_AMBULATORY_CARE_PROVIDER_SITE_OTHER): Payer: Medicare Other

## 2022-04-02 ENCOUNTER — Encounter (HOSPITAL_COMMUNITY): Payer: Self-pay

## 2022-04-02 ENCOUNTER — Other Ambulatory Visit: Payer: Self-pay

## 2022-04-02 ENCOUNTER — Ambulatory Visit (INDEPENDENT_AMBULATORY_CARE_PROVIDER_SITE_OTHER): Payer: Medicare Other | Admitting: Nurse Practitioner

## 2022-04-02 ENCOUNTER — Emergency Department (HOSPITAL_COMMUNITY): Payer: Medicare Other

## 2022-04-02 ENCOUNTER — Emergency Department (HOSPITAL_COMMUNITY)
Admission: EM | Admit: 2022-04-02 | Discharge: 2022-04-03 | Disposition: A | Discharge status: Home / Self Care | Payer: Medicare Other | Attending: Emergency Medicine | Admitting: Emergency Medicine

## 2022-04-02 ENCOUNTER — Encounter: Payer: Self-pay | Admitting: Internal Medicine

## 2022-04-02 VITALS — BP 132/78 | HR 78 | Temp 98.6°F | Ht 60.0 in | Wt 114.0 lb

## 2022-04-02 DIAGNOSIS — M62830 Muscle spasm of back: Secondary | ICD-10-CM | POA: Insufficient documentation

## 2022-04-02 DIAGNOSIS — D72829 Elevated white blood cell count, unspecified: Secondary | ICD-10-CM | POA: Diagnosis not present

## 2022-04-02 DIAGNOSIS — R1012 Left upper quadrant pain: Secondary | ICD-10-CM | POA: Diagnosis present

## 2022-04-02 DIAGNOSIS — R109 Unspecified abdominal pain: Secondary | ICD-10-CM | POA: Diagnosis not present

## 2022-04-02 DIAGNOSIS — Z79899 Other long term (current) drug therapy: Secondary | ICD-10-CM | POA: Diagnosis not present

## 2022-04-02 DIAGNOSIS — K529 Noninfective gastroenteritis and colitis, unspecified: Secondary | ICD-10-CM | POA: Insufficient documentation

## 2022-04-02 DIAGNOSIS — I10 Essential (primary) hypertension: Secondary | ICD-10-CM | POA: Diagnosis not present

## 2022-04-02 DIAGNOSIS — A09 Infectious gastroenteritis and colitis, unspecified: Secondary | ICD-10-CM | POA: Diagnosis not present

## 2022-04-02 DIAGNOSIS — N3289 Other specified disorders of bladder: Secondary | ICD-10-CM | POA: Diagnosis not present

## 2022-04-02 DIAGNOSIS — T148XXA Other injury of unspecified body region, initial encounter: Secondary | ICD-10-CM

## 2022-04-02 DIAGNOSIS — S39011A Strain of muscle, fascia and tendon of abdomen, initial encounter: Secondary | ICD-10-CM | POA: Diagnosis not present

## 2022-04-02 DIAGNOSIS — K5790 Diverticulosis of intestine, part unspecified, without perforation or abscess without bleeding: Secondary | ICD-10-CM | POA: Diagnosis not present

## 2022-04-02 DIAGNOSIS — N3001 Acute cystitis with hematuria: Secondary | ICD-10-CM | POA: Diagnosis not present

## 2022-04-02 DIAGNOSIS — K760 Fatty (change of) liver, not elsewhere classified: Secondary | ICD-10-CM | POA: Diagnosis not present

## 2022-04-02 LAB — URINALYSIS, ROUTINE W REFLEX MICROSCOPIC
Bacteria, UA: NONE SEEN
Bilirubin Urine: NEGATIVE
Bilirubin Urine: NEGATIVE
Glucose, UA: NEGATIVE mg/dL
Ketones, ur: NEGATIVE
Ketones, ur: 20 mg/dL — AB
Leukocytes,Ua: NEGATIVE
Nitrite: NEGATIVE
Nitrite: NEGATIVE
Protein, ur: NEGATIVE mg/dL
Specific Gravity, Urine: <=1.005 — AB (ref 1.000–1.030)
Specific Gravity, Urine: 1.004 — ABNORMAL LOW (ref 1.005–1.030)
Total Protein, Urine: NEGATIVE
Urine Glucose: NEGATIVE
Urobilinogen, UA: 0.2 (ref 0.0–1.0)
pH: 6.5 (ref 5.0–8.0)
pH: 7 (ref 5.0–8.0)

## 2022-04-02 LAB — LIPASE, BLOOD: Lipase: 24 U/L (ref 11–51)

## 2022-04-02 LAB — COMPREHENSIVE METABOLIC PANEL
ALT: 18 U/L (ref 0–44)
AST: 17 U/L (ref 15–41)
Albumin: 4.7 g/dL (ref 3.5–5.0)
Alkaline Phosphatase: 64 U/L (ref 38–126)
Anion gap: 13 (ref 5–15)
BUN: 7 mg/dL — ABNORMAL LOW (ref 8–23)
CO2: 27 mmol/L (ref 22–32)
Calcium: 9.7 mg/dL (ref 8.9–10.3)
Chloride: 93 mmol/L — ABNORMAL LOW (ref 98–111)
Creatinine, Ser: 0.63 mg/dL (ref 0.44–1.00)
GFR, Estimated: >60 mL/min (ref >60)
Glucose, Bld: 101 mg/dL — ABNORMAL HIGH (ref 70–99)
Potassium: 3.8 mmol/L (ref 3.5–5.1)
Sodium: 133 mmol/L — ABNORMAL LOW (ref 135–145)
Total Bilirubin: 0.9 mg/dL (ref 0.3–1.2)
Total Protein: 7.6 g/dL (ref 6.5–8.1)

## 2022-04-02 LAB — CBC WITH DIFFERENTIAL/PLATELET
Abs Immature Granulocytes: 0.05 10*3/uL (ref 0.00–0.07)
Basophils Absolute: 0.1 10*3/uL (ref 0.0–0.1)
Basophils Relative: 0 %
Eosinophils Absolute: 0.1 10*3/uL (ref 0.0–0.5)
Eosinophils Relative: 0 %
HCT: 36.7 % (ref 36.0–46.0)
Hemoglobin: 13 g/dL (ref 12.0–15.0)
Immature Granulocytes: 0 %
Lymphocytes Relative: 11 %
Lymphs Abs: 1.4 10*3/uL (ref 0.7–4.0)
MCH: 31.7 pg (ref 26.0–34.0)
MCHC: 35.4 g/dL (ref 30.0–36.0)
MCV: 89.5 fL (ref 80.0–100.0)
Monocytes Absolute: 1.1 10*3/uL — ABNORMAL HIGH (ref 0.1–1.0)
Monocytes Relative: 9 %
Neutro Abs: 10.3 10*3/uL — ABNORMAL HIGH (ref 1.7–7.7)
Neutrophils Relative %: 80 %
Platelets: 239 10*3/uL (ref 150–400)
RBC: 4.1 MIL/uL (ref 3.87–5.11)
RDW: 12.4 % (ref 11.5–15.5)
WBC: 13 10*3/uL — ABNORMAL HIGH (ref 4.0–10.5)
nRBC: 0 % (ref 0.0–0.2)

## 2022-04-02 MED ORDER — IOHEXOL 300 MG/ML  SOLN
100.0000 mL | Freq: Once | INTRAMUSCULAR | Status: AC | PRN
  Administered 2022-04-02: 100 mL via INTRAVENOUS

## 2022-04-02 MED ORDER — SODIUM CHLORIDE (PF) 0.9 % IJ SOLN
INTRAMUSCULAR | Status: AC
  Filled 2022-04-02: qty 50

## 2022-04-02 NOTE — ED Triage Notes (Signed)
Patient c/o mid back pain that radiates around to the abdomen. Patient states she was being treated with Cipro for a bladder infection. Patient states she went for a follow up appointment today and was told she still had hematuria. Patient states she was referred to the ED for further evaluation.  Patient states she began having bloating in her abdomen yesterday.

## 2022-04-02 NOTE — ED Notes (Signed)
Needs IV for CT  

## 2022-04-02 NOTE — Progress Notes (Signed)
Established Patient Office Visit  Subjective   Patient ID: [name], female    DOB: 1951-06-19  Age: 71 y.o. MRN: [id number]  Chief Complaint  Patient presents with   Abdomen pain    Left side    Patient arrives for acute visit for the above.  On 03/22/2022 she started experiencing UTI-like symptoms.  She was evaluated by her PCP on 03/26/2022 and was started on ciprofloxacin.  She has completed that course and no longer feels dysuria nor is she noticing hematuria.  On 04/01/2022 she started experiencing left-sided abdominal pain that radiates down to her groin.  She reports it as waxing and waning but pain is constant.  She ranges pain from 7-13/10 in intensity and describes it as a sharp burning pain.  She has history of colon perforation requiring hospitalization.  She also has a history of cholecystectomy and appendectomy.  She is been having nausea but denies vomiting.  She did have chills 1 day but is not sure if she is run a fever.  No history of kidney stone.  Urinalysis was ordered by PCP today which did show trace hemoglobin in urine.  PCP is ordering urine culture.    Review of Systems  Constitutional:  Negative for chills and fever.  Respiratory:  Negative for shortness of breath.   Cardiovascular:  Negative for chest pain.  Gastrointestinal:  Positive for abdominal pain, constipation and nausea. Negative for blood in stool, diarrhea, melena and vomiting.  Genitourinary:  Positive for hematuria (on 03/26/22 when evaluated for UTI). Negative for dysuria, frequency and urgency.      Objective:     BP 132/78 (BP Location: Right Arm, Patient Position: Sitting, Cuff Size: Normal)   Pulse 78   Temp 98.6 F (37 C) (Oral)   Ht 5' (1.524 m)   Wt 114 lb (51.7 kg)   SpO2 96%   BMI 22.26 kg/m    Physical Exam Vitals reviewed.  Constitutional:      General: She is not in acute distress.    Appearance: Normal appearance.  HENT:     Head: Normocephalic and atraumatic.   Neck:     Vascular: No carotid bruit.  Cardiovascular:     Rate and Rhythm: Normal rate and regular rhythm.     Pulses: Normal pulses.     Heart sounds: Normal heart sounds.  Pulmonary:     Effort: Pulmonary effort is normal.     Breath sounds: Normal breath sounds.  Abdominal:     General: Abdomen is flat. Bowel sounds are increased.     Palpations: Abdomen is soft.     Tenderness: There is abdominal tenderness in the left upper quadrant and left lower quadrant. There is left CVA tenderness and guarding. There is no right CVA tenderness or rebound.  Skin:    General: Skin is warm and dry.  Neurological:     General: No focal deficit present.     Mental Status: She is alert and oriented to person, place, and time.  Psychiatric:        Mood and Affect: Mood normal.        Behavior: Behavior normal.        Judgment: Judgment normal.      Results for orders placed or performed in visit on 04/02/22  Urinalysis, Routine w reflex microscopic  Result Value Ref Range   Color, Urine YELLOW Yellow;Lt. Yellow;Straw;Dark Yellow;Amber;Green;Red;Brown   APPearance CLEAR Clear;Turbid;Slightly Cloudy;Cloudy   Specific Gravity, Urine <=1.005 (A)  1.000 - 1.030   pH 6.5 5.0 - 8.0   Total Protein, Urine NEGATIVE Negative   Urine Glucose NEGATIVE Negative   Ketones, ur NEGATIVE Negative   Bilirubin Urine NEGATIVE Negative   Hgb urine dipstick TRACE-INTACT (A) Negative   Urobilinogen, UA 0.2 0.0 - 1.0   Leukocytes,Ua TRACE (A) Negative   Nitrite NEGATIVE Negative   WBC, UA 3-6/hpf (A) 0-2/hpf   RBC / HPF 0-2/hpf 0-2/hpf   Squamous Epithelial / LPF Rare(0-4/hpf) Rare(0-4/hpf)      The 10-year ASCVD risk score (Arnett DK, et al., 2019) is: 16.2%    Assessment & Plan:  1.  Patient presents with pretty severe abdominal pain and does have some mild guarding on exam, pain is getting progressively worse.  Vital signs are stable.  Due to the nature and severity of her abdominal pain I  recommend evaluation in the emergency department to rule out emergent causes of abdominal pain such as colonic perforation.  She reports understanding and reports that she has been to proceed to the ER now. Problem List Items Addressed This Visit       Other   Abdominal pain - Primary    Return if symptoms worsen or fail to improve.    Ailene Ards, NP

## 2022-04-02 NOTE — ED Notes (Signed)
Registration at bedside.

## 2022-04-02 NOTE — ED Provider Triage Note (Signed)
Emergency Medicine Provider Triage Evaluation Note  [name] , a 71 y.o. female  was evaluated in triage.  Pt complains of back and abd painx 1 day. + distension. Pain with movement. Recent uti with completion of cipro..  Review of Systems  Positive: Abd pain  Negative: fever  Physical Exam  BP (!) 165/86 (BP Location: Left Arm)   Pulse 90   Temp 99.8 F (37.7 C) (Oral)   Resp 16   Ht '4\' 11"'$  (1.499 m)   Wt 51.7 kg   SpO2 100%   BMI 23.03 kg/m  Gen:   Awake, no distress   Resp:  Normal effort  MSK:   Moves extremities without difficulty  Other:  No tenderness, abdominal tenderness present  Medical Decision Making  Medically screening exam initiated at 5:36 PM.  Appropriate orders placed.  LODA BIALAS was informed that the remainder of the evaluation will be completed by another provider, this initial triage assessment does not replace that evaluation, and the importance of remaining in the ED until their evaluation is complete.  Work-up initiated   Margarita Mail, PA-C 04/02/22 1742

## 2022-04-02 NOTE — ED Provider Notes (Signed)
[facility] DEPT Provider Note  CSN: [id number] Arrival date & time: 04/02/22 1647  Chief Complaint(s) Back Pain  HPI [name] is a 71 y.o. female with a past medical history listed below including recent urinary tract infection treated with Cipro (urine culture grew out Pseudomonas and Staphylococcus susceptible to Cipro).  She presents today for left upper quadrant/left-sided abdominal discomfort has been ongoing for over a week.  Over the past couple days this has gotten worse with eating.  No real alleviating factors.  No associated nausea or vomiting.  No fevers or chills.  Additionally patient is reporting moderate to severe left back pain which is her main concern tonight.  She feels like her back is tight.  Pain is worse with certain movement and palpation.  She denies any falls or trauma.  No coughing or congestion.  No shortness of breath.  No lower extremity weakness or loss of sensation.  No bladder/bowel incontinence.  The history is provided by the patient.    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[place])    borderline   Patient Active Problem List   Diagnosis Date Noted   Acute cystitis 03/26/2022   Screening for heart disease 03/26/2022   Salivary gland swelling 12/02/2020   Fall 02/13/2020   Left arm pain 02/13/2020   Abdominal pain 12/07/2019   Hyperglycemia 06/07/2019   DDD (degenerative disc disease), cervical 02/02/2019   Musculoskeletal neck pain 10/14/2018   Change in bowel function 08/11/2018   Lower abdominal pain 08/11/2018   Diverticulitis 08/11/2018   Shoulder pain, left 02/23/2017   B12 deficiency 08/07/2016   Recurrent UTI  11/08/2015   Non-celiac gluten sensitivity    Anxiety state 01/17/2010   ALLERGIC RHINITIS 01/17/2010   Dyslipidemia 07/12/2009   Essential hypertension 07/12/2009   Osteoporosis 06/22/2008   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 Authorizing Provider  ALPRAZolam (XANAX) 0.25 MG tablet TAKE 1 TABLET(0.25 MG) BY MOUTH TWICE DAILY AS NEEDED FOR ANXIETY Patient taking differently: Take 0.25 mg by mouth 2 (two) times daily as needed for anxiety. 01/15/21  Yes Burns, Claudina Lick, MD  amLODipine (NORVASC) 10 MG tablet TAKE 1 TABLET(10 MG) BY MOUTH DAILY Patient taking differently: Take 10 mg by mouth daily. 12/28/21  Yes Burns, Claudina Lick, MD  calcium citrate (CALCITRATE - DOSED IN MG ELEMENTAL CALCIUM) 950 (200 Ca) MG tablet Take 200 mg of elemental calcium by mouth 2 (two) times daily.   Yes [provider]  cetirizine (ZYRTEC) 10 MG tablet Take 10 mg by mouth daily.   Yes [provider]  cholecalciferol (VITAMIN D3) 25 MCG (1000 UT) tablet Take 1,000 Units by mouth 2 (two) times daily.   Yes [provider]  ciprofloxacin (CIPRO) 250 MG tablet Take 250 mg by mouth 2 (two) times daily.   Yes [provider]  gabapentin (NEURONTIN) 100 MG capsule Take 2 capsules (200 mg total) by mouth at bedtime. 04/01/22  Yes [name], MD  hydrochlorothiazide (HYDRODIURIL) 12.5 MG tablet TAKE 1 TABLET BY MOUTH EVERY DAY Patient taking differently: Take 12.5 mg by mouth daily. 03/04/22  Yes Burns, Claudina Lick, MD  ibuprofen (ADVIL) 200 MG tablet Take 200 mg by mouth every 8 (eight) hours as needed for fever or headache.   Yes [provider]  methocarbamol (ROBAXIN) 500 MG tablet TAKE 1 TABLET(500 MG) BY MOUTH TWICE DAILY AS NEEDED FOR MUSCLE SPASMS Patient taking differently: Take 500 mg by mouth 2  (two) times daily as needed for muscle spasms. 04/01/22  Yes Burns, Claudina Lick, MD  nitrofurantoin, macrocrystal-monohydrate, (MACROBID) 100 MG capsule TAKE 1 CAPSULE(100 MG) BY MOUTH DAILY Patient taking differently: Take 100 mg by mouth daily. 12/26/21  Yes Burns, Claudina Lick, MD  ondansetron (ZOFRAN-ODT) 4 MG disintegrating tablet Take 1 tablet (4 mg total) by mouth every 8 (eight) hours as needed for up to 3 days for nausea or vomiting. 04/03/22 04/06/22 Yes Alanea Woolridge, Grayce Sessions, MD  phenylephrine (SUDAFED PE) 10 MG TABS tablet Take 10 mg by mouth daily.   Yes [provider]  pravastatin (PRAVACHOL) 40 MG tablet Take 1 tablet (40 mg total) by mouth daily. 12/26/21  Yes Burns, Claudina Lick, MD  vitamin B-12 (CYANOCOBALAMIN) 1000 MCG tablet Take 1 tablet (1,000 mcg total) by mouth daily. 08/07/16  Yes Burns, Claudina Lick, MD                                                                                                                                    Allergies Fosamax [alendronate sodium], Hydrocodone-acetaminophen, Oxycodone-acetaminophen, and Tizanidine  Review of Systems Review of Systems As noted in HPI  Physical Exam Vital Signs  I have reviewed the triage vital signs BP (!) 147/75   Pulse 86   Temp 99.5 F (37.5 C) (Oral)   Resp 16   Ht '4\' 11"'$  (1.499 m)   Wt 51.7 kg   SpO2 98%   BMI 23.03 kg/m   Physical Exam Vitals reviewed.  Constitutional:      General: She is not in acute distress.    Appearance: She is well-developed. She is not diaphoretic.  HENT:     Head: Normocephalic and atraumatic.     Right Ear: External ear normal.     Left Ear: External ear normal.     Nose: Nose normal.  Eyes:     General: No scleral icterus.    Conjunctiva/sclera: Conjunctivae normal.  Neck:     Trachea: Phonation normal.  Cardiovascular:     Rate and Rhythm: Normal rate and regular rhythm.  Pulmonary:     Effort: Pulmonary effort is normal. No respiratory distress.     Breath sounds:  No stridor.  Abdominal:     General: There is no distension.     Tenderness: There is no abdominal tenderness.  Musculoskeletal:        General: Normal range of motion.  Cervical back: Normal range of motion.     Thoracic back: Spasms and tenderness present.       Back:  Neurological:     Mental Status: She is alert and oriented to person, place, and time.  Psychiatric:        Behavior: Behavior normal.     ED Results and Treatments Labs (all labs ordered are listed, but only abnormal results are displayed) Labs Reviewed  CBC WITH DIFFERENTIAL/PLATELET - Abnormal; Notable for the following components:      Result Value   WBC 13.0 (*)    Neutro Abs 10.3 (*)    Monocytes Absolute 1.1 (*)    All other components within normal limits  COMPREHENSIVE METABOLIC PANEL - Abnormal; Notable for the following components:   Sodium 133 (*)    Chloride 93 (*)    Glucose, Bld 101 (*)    BUN 7 (*)    All other components within normal limits  URINALYSIS, ROUTINE W REFLEX MICROSCOPIC - Abnormal; Notable for the following components:   Color, Urine COLORLESS (*)    Specific Gravity, Urine 1.004 (*)    Hgb urine dipstick SMALL (*)    Ketones, ur 20 (*)    All other components within normal limits  LIPASE, BLOOD                                                                                                                         EKG  EKG Interpretation  Date/Time:    Ventricular Rate:    PR Interval:    QRS Duration:   QT Interval:    QTC Calculation:   R Axis:     Text Interpretation:         Radiology CT Abdomen Pelvis W Contrast  Result Date: 04/03/2022 CLINICAL DATA:  Abdominal pain EXAM: CT ABDOMEN AND PELVIS WITH CONTRAST TECHNIQUE: Multidetector CT imaging of the abdomen and pelvis was performed using the standard protocol following bolus administration of intravenous contrast. RADIATION DOSE REDUCTION: This exam was performed according to the departmental  dose-optimization program which includes automated exposure control, adjustment of the mA and/or kV according to patient size and/or use of iterative reconstruction technique. CONTRAST:  184m OMNIPAQUE IOHEXOL 300 MG/ML  SOLN COMPARISON:  Similar study 09/23/2022. FINDINGS: Lower chest: Lung bases show scattered linear scar-like opacities. The cardiac size is normal. Hepatobiliary: The liver is mildly steatotic without mass enhancement. Gallbladder is absent without interval worsening biliary dilatation with chronically prominent common bile duct measuring 9 mm. Pancreas: No focal abnormality. Spleen: Stable 4.2 cm splenic cyst with calcified septations superiorly. Otherwise unremarkable normal-sized spleen. Adrenals/Urinary Tract: There is no adrenal mass. Wedge-shaped scarring in the posterior upper right kidney is again noted with a small calyceal diverticulum. Otherwise the renal cortex is unremarkable. There is no urinary stone or obstruction. There is mild bladder thickening versus underdistention. Stomach/Bowel: Mild thickened folds noted in the stomach, distal duodenum and jejunum. The appendix surgically absent, as before. There  is moderate stool retention without evidence of colitis or diverticulitis. Diverticulosis most pronounced in the sigmoid segment. Vascular/Lymphatic: No significant vascular findings are present. No enlarged abdominal or pelvic lymph nodes. Reproductive: The uterus is intact. There is a calcified leiomyoma in the posterior body of uterus. No adnexal mass or free fluid. Other: Small umbilical fat hernia. There is no incarcerated hernia. No acute inflammatory change, free air, free hemorrhage or free fluid. Musculoskeletal: Osteopenia and degenerative change lumbar spine, slight dextroscoliosis. Degenerative change lower thoracic spine. No destructive bone lesions. IMPRESSION: 1. Cystitis versus bladder nondistention. 2. Constipation and diverticulosis. No bowel obstruction or  inflammatory change. 3. Gastroenteritis without adenopathy or visible mesenteric reaction. 4. 4.2 cm stable splenic cyst. 5. Chronic wedge-shaped scarring of the posterosuperior right kidney with caliceal diverticulum. 6. Mild hepatic steatosis. 7. Aortic atherosclerosis. Electronically Signed   By: Telford Nab M.D.   On: 04/03/2022 00:10    Pertinent labs & imaging results that were available during my care of the patient were reviewed by me and considered in my medical decision making (see MDM for details).  Medications Ordered in ED Medications  sodium chloride (PF) 0.9 % injection (has no administration in time range)  iohexol (OMNIPAQUE) 300 MG/ML solution 100 mL (100 mLs Intravenous Contrast Given 04/02/22 2339)  alum & mag hydroxide-simeth (MAALOX/MYLANTA) 200-200-20 MG/5ML suspension 30 mL (30 mLs Oral Given 04/03/22 0026)  lidocaine (XYLOCAINE) 2 % viscous mouth solution 15 mL (15 mLs Mouth/Throat Given 04/03/22 0026)  ketorolac (TORADOL) 15 MG/ML injection 7.5 mg (7.5 mg Intravenous Given 04/03/22 0031)                                                                                                                                     Procedures Procedures  (including critical care time)  Medical Decision Making / ED Course    Complexity of Problem:  Co-morbidities/SDOH that complicate the patient evaluation/care: Recent urinary tract infection  Patient's presenting problem/concern, DDX, and MDM listed below: Abd discomfort Abdomen is benign on my exam Given recent infection, and antibiotic use, will obtain labs to assess for any evidence suggesting continued infectious process, metabolic or electrolyte derangements, or renal sufficiency Back pain Pain appears to be musculoskeletal in nature Less concern for cardiac etiology or pulmonary embolism.    Complexity of Data:   Laboratory Tests ordered listed below with my independent interpretation: CBC with leukocytosis.  No  anemia Metabolic panel without significant electrolyte derangement or renal sufficiency.  No evidence of biliary obstruction or pancreatitis UA with mild hematuria without evidence of infection.   Imaging Studies ordered listed below with my independent interpretation: CT scan obtained and notable for mild gastroenteritis.  No other acute findings.     ED Course:    Assessment, Add'l Intervention, and Reassessment: Abdominal discomfort Evidence of mild gastroenteritis.  Likely viral. Discomfort improved with GI cocktail. Will defer Abx for now.  Back pain  Consistent with muscular strain/spasm of the left parascapular musculature. Improved with Toradol Patient already on muscle relaxers at home Recommended additional supportive management.  Final Clinical Impression(s) / ED Diagnoses Final diagnoses:  Gastroenteritis  Muscle strain   The patient appears reasonably screened and/or stabilized for discharge and I doubt any other medical condition or other [facility] requiring further screening, evaluation, or treatment in the ED at this time. I have discussed the findings, Dx and Tx plan with the patient/family who expressed understanding and agree(s) with the plan. Discharge instructions discussed at length. The patient/family was given strict return precautions who verbalized understanding of the instructions. No further questions at time of discharge.  Disposition: Discharge  Condition: Good  ED Discharge Orders          Ordered    ondansetron (ZOFRAN-ODT) 4 MG disintegrating tablet  Every 8 hours PRN        04/03/22 0155            Follow Up: [name], MD [place] [id number] [phone]  Call  to schedule an appointment for close follow up           This chart was dictated using voice recognition software.  Despite best efforts to proofread,  errors can occur which can change the documentation meaning.    Fatima Blank,  MD 04/03/22 [id number]

## 2022-04-02 NOTE — ED Notes (Signed)
Patient back from CT.

## 2022-04-02 NOTE — ED Notes (Signed)
Patient endorses lower abd pain, radiating around to back.  Patient reports she was constipated earlier this week, took miralax, and had a BM yesterday.  Patient reports pain didn't change.

## 2022-04-02 NOTE — ED Provider Notes (Incomplete)
[facility] DEPT Provider Note  CSN: [id number] Arrival date & time: 04/02/22 1647  Chief Complaint(s) Back Pain  HPI [name] is a 71 y.o. female {Add pertinent medical, surgical, social history, OB history to HPI:1}   The history is provided by the patient.  Abdominal Pain Pain location:  Periumbilical Pain quality: aching   Pain radiates to:  Back Pain severity:  Moderate Onset quality:  Gradual Timing:  Constant Progression:  Worsening Chronicity:  New Context: recent illness (UTI s/p Cipro)     Past Medical History Past Medical History:  Diagnosis Date  . ALLERGIC RHINITIS   . ANEMIA, PERNICIOUS   . ANXIETY   . BILIARY DYSKINESIA   . CARPAL TUNNEL SYNDROME, LEFT   . DYSLIPIDEMIA   . Headache(784.0)   . HYPERTENSION   . Irritable bowel syndrome   . Non-celiac gluten sensitivity   . OSTEOPENIA   . 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 Acute cystitis 03/26/2022  . Screening for heart disease 03/26/2022  . Salivary gland swelling 12/02/2020  . Fall 02/13/2020  . Left arm pain 02/13/2020  . Abdominal pain 12/07/2019  . Hyperglycemia 06/07/2019  . DDD (degenerative disc disease), cervical 02/02/2019  . Musculoskeletal neck pain 10/14/2018  . Change in bowel function 08/11/2018  . Lower abdominal pain 08/11/2018  . Diverticulitis 08/11/2018  . Shoulder pain, left 02/23/2017  . B12 deficiency 08/07/2016  . Recurrent UTI 11/08/2015  . Non-celiac gluten sensitivity   . Anxiety state 01/17/2010  . ALLERGIC RHINITIS 01/17/2010  . Dyslipidemia 07/12/2009  . Essential hypertension 07/12/2009  . Osteoporosis 06/22/2008  . 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 ANEMIA, PERNICIOUS 10/14/2007  . IRRITABLE BOWEL SYNDROME 10/14/2007   Home Medication(s) Prior to Admission medications   Medication Sig Start Date End Date Taking? Authorizing  Provider  ALPRAZolam (XANAX) 0.25 MG tablet TAKE 1 TABLET(0.25 MG) BY MOUTH TWICE DAILY AS NEEDED FOR ANXIETY Patient taking differently: Take 0.25 mg by mouth 2 (two) times daily as needed for anxiety. 01/15/21  Yes Burns, Claudina Lick, MD  amLODipine (NORVASC) 10 MG tablet TAKE 1 TABLET(10 MG) BY MOUTH DAILY Patient taking differently: Take 10 mg by mouth daily. 12/28/21  Yes Burns, Claudina Lick, MD  calcium citrate (CALCITRATE - DOSED IN MG ELEMENTAL CALCIUM) 950 (200 Ca) MG tablet Take 200 mg of elemental calcium by mouth 2 (two) times daily.   Yes [provider]  cetirizine (ZYRTEC) 10 MG tablet Take 10 mg by mouth daily.   Yes [provider]  cholecalciferol (VITAMIN D3) 25 MCG (1000 UT) tablet Take 1,000 Units by mouth 2 (two) times daily.   Yes [provider]  ciprofloxacin (CIPRO) 250 MG tablet Take 250 mg by mouth 2 (two) times daily.   Yes [provider]  gabapentin (NEURONTIN) 100 MG capsule Take 2 capsules (200 mg total) by mouth at bedtime. 04/01/22  Yes Burns, Claudina Lick, MD  hydrochlorothiazide (HYDRODIURIL) 12.5 MG tablet TAKE 1 TABLET BY MOUTH EVERY DAY Patient taking differently: Take 12.5 mg by mouth daily. 03/04/22  Yes Burns, Claudina Lick, MD  ibuprofen (ADVIL) 200 MG tablet Take 200 mg by mouth every 8 (eight) hours as needed for fever or headache.   Yes [provider]  methocarbamol (ROBAXIN) 500 MG tablet TAKE 1 TABLET(500 MG) BY MOUTH TWICE DAILY AS NEEDED FOR MUSCLE SPASMS Patient taking differently: Take 500 mg by  mouth 2 (two) times daily as needed for muscle spasms. 04/01/22  Yes Burns, Claudina Lick, MD  nitrofurantoin, macrocrystal-monohydrate, (MACROBID) 100 MG capsule TAKE 1 CAPSULE(100 MG) BY MOUTH DAILY Patient taking differently: Take 100 mg by mouth daily. 12/26/21  Yes Burns, Claudina Lick, MD  phenylephrine (SUDAFED PE) 10 MG TABS tablet Take 10 mg by mouth daily.   Yes [provider]  pravastatin (PRAVACHOL) 40 MG tablet Take 1 tablet  (40 mg total) by mouth daily. 12/26/21  Yes Burns, Claudina Lick, MD  vitamin B-12 (CYANOCOBALAMIN) 1000 MCG tablet Take 1 tablet (1,000 mcg total) by mouth daily. 08/07/16  Yes Burns, Claudina Lick, MD                                                                                                                                    Allergies Fosamax [alendronate sodium], Hydrocodone-acetaminophen, Oxycodone-acetaminophen, and Tizanidine  Review of Systems Review of Systems  Gastrointestinal:  Positive for abdominal pain.   As noted in HPI  Physical Exam Vital Signs  I have reviewed the triage vital signs BP (!) 156/81   Pulse 90   Temp 99.5 F (37.5 C) (Oral)   Resp 18   Ht '4\' 11"'$  (1.499 m)   Wt 51.7 kg   SpO2 98%   BMI 23.03 kg/m  *** Physical Exam  ED Results and Treatments Labs (all labs ordered are listed, but only abnormal results are displayed) Labs Reviewed  CBC WITH DIFFERENTIAL/PLATELET - Abnormal; Notable for the following components:      Result Value   WBC 13.0 (*)    Neutro Abs 10.3 (*)    Monocytes Absolute 1.1 (*)    All other components within normal limits  COMPREHENSIVE METABOLIC PANEL - Abnormal; Notable for the following components:   Sodium 133 (*)    Chloride 93 (*)    Glucose, Bld 101 (*)    BUN 7 (*)    All other components within normal limits  URINALYSIS, ROUTINE W REFLEX MICROSCOPIC - Abnormal; Notable for the following components:   Color, Urine COLORLESS (*)    Specific Gravity, Urine 1.004 (*)    Hgb urine dipstick SMALL (*)    Ketones, ur 20 (*)    All other components within normal limits  LIPASE, BLOOD  EKG  EKG Interpretation  Date/Time:    Ventricular Rate:    PR Interval:    QRS Duration:   QT Interval:    QTC Calculation:   R Axis:     Text Interpretation:         Radiology No results found.  Pertinent labs  & imaging results that were available during my care of the patient were reviewed by me and considered in my medical decision making (see MDM for details).  Medications Ordered in ED Medications  sodium chloride (PF) 0.9 % injection (has no administration in time range)  iohexol (OMNIPAQUE) 300 MG/ML solution 100 mL (100 mLs Intravenous Contrast Given 04/02/22 2339)                                                                                                                                     Procedures Procedures  (including critical care time)  Medical Decision Making / ED Course    Complexity of Problem:  Co-morbidities/SDOH that complicate the patient evaluation/care: ***  Additional history obtained: ***  Patient's presenting problem/concern, DDX, and MDM listed below: ***  Hospitalization Considered:  ***  Initial Intervention:  ***    Complexity of Data:   Cardiac Monitoring: ***  Laboratory Tests ordered listed below with my independent interpretation: ***   Imaging Studies ordered listed below with my independent interpretation: ***     ED Course:    Assessment, Add'l Intervention, and Reassessment: ***  ***  Final Clinical Impression(s) / ED Diagnoses Final diagnoses:  None    {Document critical care time when appropriate:1}  {Document review of labs and clinical decision tools ie heart score, Chads2Vasc2 etc:1}  {Document your independent review of radiology images, and any outside records:1} {Document your discussion with family members, caretakers, and with consultants:1} {Document social determinants of health affecting pt's care:1} {Document your decision making why or why not admission, treatments were needed:1} This chart was dictated using voice recognition software.  Despite best efforts to proofread,  errors can occur which can change the documentation meaning.

## 2022-04-03 DIAGNOSIS — K529 Noninfective gastroenteritis and colitis, unspecified: Secondary | ICD-10-CM | POA: Diagnosis not present

## 2022-04-03 LAB — URINE CULTURE: Result:: NO GROWTH

## 2022-04-03 MED ORDER — KETOROLAC TROMETHAMINE 15 MG/ML IJ SOLN
7.5000 mg | Freq: Once | INTRAMUSCULAR | Status: AC
  Administered 2022-04-03: 7.5 mg via INTRAVENOUS
  Filled 2022-04-03: qty 1

## 2022-04-03 MED ORDER — LIDOCAINE VISCOUS HCL 2 % MT SOLN
15.0000 mL | Freq: Once | OROMUCOSAL | Status: AC
  Administered 2022-04-03: 15 mL via OROMUCOSAL
  Filled 2022-04-03: qty 15

## 2022-04-03 MED ORDER — ONDANSETRON 4 MG PO TBDP: 4.0000 mg | ORAL_TABLET | Freq: Three times a day (TID) | ORAL | 0 refills | Status: AC | PRN

## 2022-04-03 MED ORDER — ALUM & MAG HYDROXIDE-SIMETH 200-200-20 MG/5ML PO SUSP
30.0000 mL | Freq: Once | ORAL | Status: AC
  Administered 2022-04-03: 30 mL via ORAL
  Filled 2022-04-03: qty 30

## 2022-04-03 NOTE — Discharge Instructions (Addendum)
You may use over-the-counter Motrin (Ibuprofen), Acetaminophen (Tylenol), topical muscle creams such as SalonPas, Icy Hot, Bengay, etc. Please stretch, apply ice or heat (whichever helps), and have massage therapy for additional assistance.  

## 2022-04-03 NOTE — ED Notes (Signed)
Patient ambulated to restroom with a steady gait

## 2022-04-06 ENCOUNTER — Ambulatory Visit (INDEPENDENT_AMBULATORY_CARE_PROVIDER_SITE_OTHER): Payer: Medicare Other | Admitting: Internal Medicine

## 2022-04-06 ENCOUNTER — Encounter: Payer: Self-pay | Admitting: Internal Medicine

## 2022-04-06 VITALS — BP 136/82 | HR 82 | Temp 98.8°F | Ht 59.0 in | Wt 114.5 lb

## 2022-04-06 DIAGNOSIS — M25562 Pain in left knee: Secondary | ICD-10-CM

## 2022-04-06 DIAGNOSIS — R5383 Other fatigue: Secondary | ICD-10-CM

## 2022-04-06 DIAGNOSIS — I700 Atherosclerosis of aorta: Secondary | ICD-10-CM | POA: Insufficient documentation

## 2022-04-06 DIAGNOSIS — M791 Myalgia, unspecified site: Secondary | ICD-10-CM | POA: Diagnosis not present

## 2022-04-06 DIAGNOSIS — M25561 Pain in right knee: Secondary | ICD-10-CM

## 2022-04-06 LAB — CBC WITH DIFFERENTIAL/PLATELET
Basophils Absolute: 0 10*3/uL (ref 0.0–0.1)
Basophils Relative: 0.3 % (ref 0.0–3.0)
Eosinophils Absolute: 0 10*3/uL (ref 0.0–0.7)
Eosinophils Relative: 0.1 % (ref 0.0–5.0)
HCT: 37.3 % (ref 36.0–46.0)
Hemoglobin: 12.9 g/dL (ref 12.0–15.0)
Lymphocytes Relative: 7.8 % — ABNORMAL LOW (ref 12.0–46.0)
Lymphs Abs: 0.9 10*3/uL (ref 0.7–4.0)
MCHC: 34.6 g/dL (ref 30.0–36.0)
MCV: 90.1 fl (ref 78.0–100.0)
Monocytes Absolute: 0.8 10*3/uL (ref 0.1–1.0)
Monocytes Relative: 6.7 % (ref 3.0–12.0)
Neutro Abs: 10 10*3/uL — ABNORMAL HIGH (ref 1.4–7.7)
Neutrophils Relative %: 85.1 % — ABNORMAL HIGH (ref 43.0–77.0)
Platelets: 281 10*3/uL (ref 150.0–400.0)
RBC: 4.14 Mil/uL (ref 3.87–5.11)
RDW: 12.9 % (ref 11.5–15.5)
WBC: 11.7 10*3/uL — ABNORMAL HIGH (ref 4.0–10.5)

## 2022-04-06 NOTE — Patient Instructions (Addendum)
     Blood work was ordered.     Medications changes include :   can take gabapentin 300 mg at night - can take three times a day.  Methocarbamol - you can take three times a day.      Return if symptoms worsen or fail to improve.

## 2022-04-06 NOTE — Assessment & Plan Note (Signed)
Acute Started last week - sounds more muscular, but muscles nontender to palpation Mild swelling bilateral knees, pain with movement but not much pain with palpation fatigue Recent UTI, recent lower abdominal pain and evidence of gastroenteritis on CT, but no diarrhea Recent tick bite ?  Reactive arthritis ?  Tick disease ?  Autoimmune disease ANA, CBC, CMP, CRP, CCP, Lyme test, RF, ESR Symptomatic treatment for now-ibuprofen, gabapentin, methotrexate Treatment based on above

## 2022-04-06 NOTE — Progress Notes (Signed)
Subjective:    Patient ID: [name], female    DOB: [birth year], 71 y.o.   MRN: [id number]      HPI [name] is here for  Chief Complaint  Patient presents with   Back Pain   Knee Pain    Since last night     Was having abd pain, nausea, constipation.  Ct scan at [facility] showed gastroenteritis.  Mild hepatic steatosis.  Occ feels a twinge.  No pain.     Started having back pain 5-6 days.  Initially it was under her left shoulder blades.  Now between both shoulder blades and down her back.  Still having back pain - taking muscle relaxer and gabapentin - they are helping.   Knee pain since last night - b/l - started yesterday late afternoon.  She noticed it when she tried to squat down to put something away and could not do it.  Left knee felt a little swollen behind the knee.  Both feel pain feel inflamed.  Mild movement hurts.  They hurt walking.  The knees look swollen.   No other joints hurts.     Tick on her a few weeks ago.    Medications and allergies reviewed with patient and updated if appropriate.  Current Outpatient Medications on File Prior to Visit  Medication Sig Dispense Refill   ALPRAZolam (XANAX) 0.25 MG tablet TAKE 1 TABLET(0.25 MG) BY MOUTH TWICE DAILY AS NEEDED FOR ANXIETY (Patient taking differently: Take 0.25 mg by mouth 2 (two) times daily as needed for anxiety.) 60 tablet 0   amLODipine (NORVASC) 10 MG tablet TAKE 1 TABLET(10 MG) BY MOUTH DAILY (Patient taking differently: Take 10 mg by mouth daily.) 90 tablet 1   calcium citrate (CALCITRATE - DOSED IN MG ELEMENTAL CALCIUM) 950 (200 Ca) MG tablet Take 200 mg of elemental calcium by mouth 2 (two) times daily.     cetirizine (ZYRTEC) 10 MG tablet Take 10 mg by mouth daily.     cholecalciferol (VITAMIN D3) 25 MCG (1000 UT) tablet Take 1,000 Units by mouth 2 (two) times daily.     ciprofloxacin (CIPRO) 250 MG tablet Take 250 mg by mouth 2 (two) times daily.     gabapentin (NEURONTIN) 100 MG capsule Take 2  capsules (200 mg total) by mouth at bedtime. 60 capsule 3   hydrochlorothiazide (HYDRODIURIL) 12.5 MG tablet TAKE 1 TABLET BY MOUTH EVERY DAY (Patient taking differently: Take 12.5 mg by mouth daily.) 90 tablet 1   ibuprofen (ADVIL) 200 MG tablet Take 200 mg by mouth every 8 (eight) hours as needed for fever or headache.     methocarbamol (ROBAXIN) 500 MG tablet TAKE 1 TABLET(500 MG) BY MOUTH TWICE DAILY AS NEEDED FOR MUSCLE SPASMS (Patient taking differently: Take 500 mg by mouth 2 (two) times daily as needed for muscle spasms.) 30 tablet 0   nitrofurantoin, macrocrystal-monohydrate, (MACROBID) 100 MG capsule TAKE 1 CAPSULE(100 MG) BY MOUTH DAILY (Patient taking differently: Take 100 mg by mouth daily.) 90 capsule 1   ondansetron (ZOFRAN-ODT) 4 MG disintegrating tablet Take 1 tablet (4 mg total) by mouth every 8 (eight) hours as needed for up to 3 days for nausea or vomiting. 15 tablet 0   phenylephrine (SUDAFED PE) 10 MG TABS tablet Take 10 mg by mouth daily.     pravastatin (PRAVACHOL) 40 MG tablet Take 1 tablet (40 mg total) by mouth daily. 90 tablet 2   vitamin B-12 (CYANOCOBALAMIN) 1000 MCG tablet Take 1 tablet (  1,000 mcg total) by mouth daily.     No current facility-administered medications on file prior to visit.    Review of Systems  Constitutional:  Positive for fatigue. Negative for fever.  HENT:  Negative for congestion, sinus pain and sore throat.   Respiratory:  Positive for cough (started last night - shallow, dry). Negative for shortness of breath and wheezing.   Cardiovascular:  Negative for chest pain and palpitations.  Gastrointestinal:  Negative for abdominal pain, constipation and diarrhea.  Genitourinary:  Negative for dysuria, frequency and hematuria.  Musculoskeletal:  Positive for arthralgias, back pain (back pain - twice with deep breaths, muscle hurt), joint swelling (knees) and myalgias.  Skin:  Negative for rash.  Neurological:  Positive for headaches (today).  Negative for light-headedness and numbness.       Objective:   Vitals:   04/06/22 1458 04/06/22 1505  BP: (!) 144/72 136/82  Pulse: 82   Temp: 98.8 F (37.1 C)   SpO2: 98%    BP Readings from Last 3 Encounters:  04/06/22 136/82  04/03/22 140/68  04/02/22 132/78   Wt Readings from Last 3 Encounters:  04/06/22 114 lb 8 oz (51.9 kg)  04/02/22 114 lb (51.7 kg)  04/02/22 114 lb (51.7 kg)   Body mass index is 23.13 kg/m.    Physical Exam Constitutional:      General: She is not in acute distress.    Appearance: Normal appearance. She is not ill-appearing.  HENT:     Head: Normocephalic and atraumatic.  Cardiovascular:     Rate and Rhythm: Normal rate and regular rhythm.  Pulmonary:     Effort: Pulmonary effort is normal. No respiratory distress.     Breath sounds: Normal breath sounds. No wheezing or rales.  Abdominal:     General: There is no distension.     Palpations: Abdomen is soft.     Tenderness: There is no abdominal tenderness. There is no guarding or rebound.  Musculoskeletal:        General: Swelling (Mild bilateral knee swelling) present. No tenderness (No bilateral knee tenderness with palpation, no muscular back pain with palpation, no lumbar spine pain).  Skin:    General: Skin is warm and dry.     Findings: No rash.  Neurological:     Mental Status: She is alert.            Assessment & Plan:    See Problem List for Assessment and Plan of chronic medical problems.    I spent 20 minutes dedicated to the care of this patient on the date of this encounter including review of recent labs, imaging, previous office and ED notes, obtaining history, communicating with the patient, ordering  tests, and documenting clinical information in the EHR

## 2022-04-06 NOTE — Assessment & Plan Note (Signed)
Acute Mild swelling bilateral knees, pain with movement but not much pain with palpation,  back pain that sounds more muscular, but muscles nontender to palpation Recent UTI, recent lower abdominal pain and evidence of gastroenteritis on CT, but no diarrhea Recent tick bite ?  Reactive arthritis ?  Tick disease ?  Autoimmune disease ANA, CBC, CMP, CRP, CCP, Lyme test, RF, ESR Symptomatic treatment for now-ibuprofen, gabapentin, methotrexate Treatment based on above

## 2022-04-06 NOTE — Assessment & Plan Note (Addendum)
Acute Started last night Mild swelling bilateral knees, pain with movement but not much pain with palpation Also with back pain that sounds more muscular, but muscles nontender to palpation fatigue Recent UTI, recent lower abdominal pain and evidence of gastroenteritis on CT, but no diarrhea Recent tick bite ?  Reactive arthritis ?  Tick disease ?  Autoimmune disease ANA, CBC, CMP, CRP, CCP, Lyme test, RF, ESR Symptomatic treatment for now-ibuprofen, gabapentin, methotrexate Treatment based on above

## 2022-04-07 ENCOUNTER — Encounter: Payer: Self-pay | Admitting: Internal Medicine

## 2022-04-07 LAB — COMPREHENSIVE METABOLIC PANEL
ALT: 15 U/L (ref 0–35)
AST: 18 U/L (ref 0–37)
Albumin: 4.7 g/dL (ref 3.5–5.2)
Alkaline Phosphatase: 80 U/L (ref 39–117)
BUN: 6 mg/dL (ref 6–23)
CO2: 30 mEq/L (ref 19–32)
Calcium: 9.9 mg/dL (ref 8.4–10.5)
Chloride: 85 mEq/L — ABNORMAL LOW (ref 96–112)
Creatinine, Ser: 0.59 mg/dL (ref 0.40–1.20)
GFR: 92.22 mL/min (ref >60.00)
Glucose, Bld: 95 mg/dL (ref 70–99)
Potassium: 3 mEq/L — ABNORMAL LOW (ref 3.5–5.1)
Sodium: 128 mEq/L — ABNORMAL LOW (ref 135–145)
Total Bilirubin: 0.7 mg/dL (ref 0.2–1.2)
Total Protein: 7.9 g/dL (ref 6.0–8.3)

## 2022-04-07 LAB — LYME DISEASE SEROLOGY W/REFLEX: Lyme Total Antibody EIA: NEGATIVE

## 2022-04-07 LAB — C-REACTIVE PROTEIN: CRP: 11.4 mg/dL (ref 0.5–20.0)

## 2022-04-07 LAB — SEDIMENTATION RATE: Sed Rate: 37 mm/hr — ABNORMAL HIGH (ref 0–30)

## 2022-04-08 ENCOUNTER — Encounter: Payer: Self-pay | Admitting: Internal Medicine

## 2022-04-08 ENCOUNTER — Other Ambulatory Visit: Payer: Self-pay | Admitting: Internal Medicine

## 2022-04-08 LAB — CYCLIC CITRUL PEPTIDE ANTIBODY, IGG: Cyclic Citrullin Peptide Ab: <16 UNITS

## 2022-04-08 LAB — RHEUMATOID FACTOR: Rheumatoid fact SerPl-aCnc: <14 IU/mL (ref <14)

## 2022-04-08 LAB — ANA: Anti Nuclear Antibody (ANA): NEGATIVE

## 2022-04-08 MED ORDER — PREDNISONE 10 MG PO TABS: ORAL_TABLET | ORAL | 0 refills | Status: DC

## 2022-04-08 MED ORDER — DOXYCYCLINE HYCLATE 100 MG PO TABS: 100.0000 mg | ORAL_TABLET | Freq: Two times a day (BID) | ORAL | 0 refills | Status: AC

## 2022-04-08 NOTE — Telephone Encounter (Signed)
Pls advise on quality change.Marland KitchenJohny [name]

## 2022-04-09 ENCOUNTER — Encounter: Payer: Self-pay | Admitting: Internal Medicine

## 2022-04-09 DIAGNOSIS — R700 Elevated erythrocyte sedimentation rate: Secondary | ICD-10-CM

## 2022-04-09 DIAGNOSIS — M2550 Pain in unspecified joint: Secondary | ICD-10-CM

## 2022-04-12 NOTE — Addendum Note (Signed)
Addended by: [name] on: 04/12/2022 07:17 PM   Modules accepted: Orders

## 2022-04-13 MED ORDER — GABAPENTIN 100 MG PO CAPS: 200.0000 mg | ORAL_CAPSULE | Freq: Every day | ORAL | 3 refills | Status: DC

## 2022-04-13 NOTE — Addendum Note (Signed)
Addended by: [name] on: 04/13/2022 08:56 PM   Modules accepted: Orders

## 2022-04-14 ENCOUNTER — Ambulatory Visit (INDEPENDENT_AMBULATORY_CARE_PROVIDER_SITE_OTHER): Payer: Medicare Other | Admitting: Internal Medicine

## 2022-04-14 ENCOUNTER — Ambulatory Visit (INDEPENDENT_AMBULATORY_CARE_PROVIDER_SITE_OTHER): Payer: Medicare Other

## 2022-04-14 ENCOUNTER — Encounter: Payer: Self-pay | Admitting: Internal Medicine

## 2022-04-14 VITALS — BP 132/70 | HR 85 | Temp 98.5°F | Ht 59.0 in | Wt 110.5 lb

## 2022-04-14 DIAGNOSIS — D473 Essential (hemorrhagic) thrombocythemia: Secondary | ICD-10-CM | POA: Insufficient documentation

## 2022-04-14 DIAGNOSIS — M25561 Pain in right knee: Secondary | ICD-10-CM | POA: Diagnosis not present

## 2022-04-14 DIAGNOSIS — E5111 Dry beriberi: Secondary | ICD-10-CM

## 2022-04-14 DIAGNOSIS — M7989 Other specified soft tissue disorders: Secondary | ICD-10-CM | POA: Diagnosis not present

## 2022-04-14 DIAGNOSIS — M02361 Reiter's disease, right knee: Secondary | ICD-10-CM | POA: Insufficient documentation

## 2022-04-14 DIAGNOSIS — I10 Essential (primary) hypertension: Secondary | ICD-10-CM

## 2022-04-14 DIAGNOSIS — G6182 Multifocal motor neuropathy: Secondary | ICD-10-CM | POA: Diagnosis not present

## 2022-04-14 LAB — CBC WITH DIFFERENTIAL/PLATELET
Basophils Absolute: 0 10*3/uL (ref 0.0–0.1)
Basophils Relative: 0.1 % (ref 0.0–3.0)
Eosinophils Absolute: 0 10*3/uL (ref 0.0–0.7)
Eosinophils Relative: 0 % (ref 0.0–5.0)
HCT: 35.6 % — ABNORMAL LOW (ref 36.0–46.0)
Hemoglobin: 12.2 g/dL (ref 12.0–15.0)
Lymphocytes Relative: 3.4 % — ABNORMAL LOW (ref 12.0–46.0)
Lymphs Abs: 0.5 10*3/uL — ABNORMAL LOW (ref 0.7–4.0)
MCHC: 34.2 g/dL (ref 30.0–36.0)
MCV: 90.7 fl (ref 78.0–100.0)
Monocytes Absolute: 0.7 10*3/uL (ref 0.1–1.0)
Monocytes Relative: 5 % (ref 3.0–12.0)
Neutro Abs: 13.6 10*3/uL — ABNORMAL HIGH (ref 1.4–7.7)
Neutrophils Relative %: 91.5 % — ABNORMAL HIGH (ref 43.0–77.0)
Platelets: 585 10*3/uL — ABNORMAL HIGH (ref 150.0–400.0)
RBC: 3.93 Mil/uL (ref 3.87–5.11)
RDW: 13.1 % (ref 11.5–15.5)
WBC: 14.8 10*3/uL — ABNORMAL HIGH (ref 4.0–10.5)

## 2022-04-14 LAB — BASIC METABOLIC PANEL
BUN: 9 mg/dL (ref 6–23)
CO2: 28 mEq/L (ref 19–32)
Calcium: 10 mg/dL (ref 8.4–10.5)
Chloride: 86 mEq/L — ABNORMAL LOW (ref 96–112)
Creatinine, Ser: 0.6 mg/dL (ref 0.40–1.20)
GFR: 91.83 mL/min (ref >60.00)
Glucose, Bld: 128 mg/dL — ABNORMAL HIGH (ref 70–99)
Potassium: 3.2 mEq/L — ABNORMAL LOW (ref 3.5–5.1)
Sodium: 128 mEq/L — ABNORMAL LOW (ref 135–145)

## 2022-04-14 LAB — C-REACTIVE PROTEIN: CRP: 14 mg/dL (ref 0.5–20.0)

## 2022-04-14 LAB — VITAMIN B12: Vitamin B-12: >1500 pg/mL — ABNORMAL HIGH (ref 211–911)

## 2022-04-14 LAB — FOLATE: Folate: 11.9 ng/mL (ref >5.9)

## 2022-04-14 LAB — CK: Total CK: 17 U/L (ref 7–177)

## 2022-04-14 LAB — TSH: TSH: 1.1 u[IU]/mL (ref 0.35–5.50)

## 2022-04-14 MED ORDER — GABAPENTIN 100 MG PO CAPS: 200.0000 mg | ORAL_CAPSULE | Freq: Two times a day (BID) | ORAL | 0 refills | Status: DC

## 2022-04-14 NOTE — Progress Notes (Signed)
Subjective:  Patient ID: [name], female    DOB: 03/13/1951  Age: 71 y.o. MRN: [id number]  CC: Hypertension   HPI [name] presents for f/up -  New to me.  I think I am the fourth person to see her for this.  It sounds like her symptoms started a month ago when she had something described as gastroenteritis.  She was seen in the ED and her CT scan was consistent with a gastroenteritis.  She tells me that she is no longer having nausea and vomiting but she complains of muscle weakness that is worsening in both lower extremities.  She complains that her legs feel wobbly and unstable.  She has developed pain and swelling in her right knee with no history of trauma or injury.  She denies numbness or tingling.  There is some discomfort with this so she has been taking gabapentin and prednisone.  She needs a refill on the gabapentin.  She has also been treated with Cipro and doxycycline.  Recent labs revealed a low potassium level and high white cell count.  Outpatient Medications Prior to Visit  Medication Sig Dispense Refill   ALPRAZolam (XANAX) 0.25 MG tablet TAKE 1 TABLET(0.25 MG) BY MOUTH TWICE DAILY AS NEEDED FOR ANXIETY (Patient taking differently: Take 0.25 mg by mouth 2 (two) times daily as needed for anxiety.) 60 tablet 0   amLODipine (NORVASC) 10 MG tablet TAKE 1 TABLET(10 MG) BY MOUTH DAILY (Patient taking differently: Take 10 mg by mouth daily.) 90 tablet 1   calcium citrate (CALCITRATE - DOSED IN MG ELEMENTAL CALCIUM) 950 (200 Ca) MG tablet Take 200 mg of elemental calcium by mouth 2 (two) times daily.     cetirizine (ZYRTEC) 10 MG tablet Take 10 mg by mouth daily.     cholecalciferol (VITAMIN D3) 25 MCG (1000 UT) tablet Take 1,000 Units by mouth 2 (two) times daily.     ciprofloxacin (CIPRO) 250 MG tablet Take 250 mg by mouth 2 (two) times daily.     doxycycline (VIBRA-TABS) 100 MG tablet Take 1 tablet (100 mg total) by mouth 2 (two) times daily for 14 days. 28 tablet 0    ibuprofen (ADVIL) 200 MG tablet Take 200 mg by mouth every 8 (eight) hours as needed for fever or headache.     methocarbamol (ROBAXIN) 500 MG tablet TAKE 1 TABLET(500 MG) BY MOUTH TWICE DAILY AS NEEDED FOR MUSCLE SPASMS 30 tablet 0   nitrofurantoin, macrocrystal-monohydrate, (MACROBID) 100 MG capsule TAKE 1 CAPSULE(100 MG) BY MOUTH DAILY (Patient taking differently: Take 100 mg by mouth daily.) 90 capsule 1   pravastatin (PRAVACHOL) 40 MG tablet Take 1 tablet (40 mg total) by mouth daily. 90 tablet 2   predniSONE (DELTASONE) 10 MG tablet Take 4 tabs po qd x 3 days, then 3 tabs po qd x 3 days, then 2 tabs po qd x 3 days, then 1 tab po qd x 3 days 30 tablet 0   vitamin B-12 (CYANOCOBALAMIN) 1000 MCG tablet Take 1 tablet (1,000 mcg total) by mouth daily.     gabapentin (NEURONTIN) 100 MG capsule Take 2 capsules (200 mg total) by mouth at bedtime. 60 capsule 3   hydrochlorothiazide (HYDRODIURIL) 12.5 MG tablet TAKE 1 TABLET BY MOUTH EVERY DAY (Patient taking differently: Take 12.5 mg by mouth daily.) 90 tablet 1   phenylephrine (SUDAFED PE) 10 MG TABS tablet Take 10 mg by mouth daily.     No facility-administered medications prior to visit.  ROS Review of Systems  Constitutional:  Positive for fatigue. Negative for chills, diaphoresis and unexpected weight change.  HENT: Negative.    Eyes: Negative.   Respiratory:  Negative for cough, chest tightness, shortness of breath and wheezing.   Cardiovascular:  Negative for chest pain and palpitations.  Gastrointestinal:  Negative for abdominal pain, constipation, diarrhea, nausea and vomiting.  Endocrine: Negative.   Genitourinary: Negative.  Negative for difficulty urinating.  Musculoskeletal:  Positive for arthralgias and joint swelling. Negative for back pain, myalgias and neck pain.  Skin: Negative.  Negative for color change and rash.  Allergic/Immunologic: Negative.   Neurological:  Positive for weakness. Negative for dizziness, tremors and  numbness.  Hematological:  Negative for adenopathy. Does not bruise/bleed easily.  Psychiatric/Behavioral:  The patient is nervous/anxious.     Objective:  BP 132/70 (BP Location: Left Arm, Patient Position: Sitting, Cuff Size: Small)   Pulse 85   Temp 98.5 F (36.9 C)   Ht '4\' 11"'$  (1.499 m)   Wt 110 lb 8 oz (50.1 kg)   SpO2 96%   BMI 22.32 kg/m   BP Readings from Last 3 Encounters:  04/14/22 132/70  04/06/22 136/82  04/03/22 140/68    Wt Readings from Last 3 Encounters:  04/14/22 110 lb 8 oz (50.1 kg)  04/06/22 114 lb 8 oz (51.9 kg)  04/02/22 114 lb (51.7 kg)    Physical Exam Vitals reviewed.  HENT:     Nose: Nose normal.     Mouth/Throat:     Mouth: Mucous membranes are moist.  Eyes:     General: No scleral icterus.    Conjunctiva/sclera: Conjunctivae normal.  Cardiovascular:     Rate and Rhythm: Normal rate and regular rhythm.     Heart sounds: No murmur heard. Pulmonary:     Effort: Pulmonary effort is normal.     Breath sounds: No stridor. No wheezing, rhonchi or rales.  Abdominal:     General: Abdomen is flat.     Palpations: There is no mass.     Tenderness: There is no abdominal tenderness. There is no guarding.     Hernia: No hernia is present.  Musculoskeletal:        General: Swelling present. Normal range of motion.     Cervical back: Neck supple.     Right knee: Swelling present. No deformity, erythema, bony tenderness or crepitus. Normal range of motion.     Left knee: Normal. No bony tenderness or crepitus. Normal range of motion.     Right lower leg: No edema.     Left lower leg: No edema.  Lymphadenopathy:     Cervical: No cervical adenopathy.  Skin:    General: Skin is warm and dry.  Neurological:     Mental Status: She is alert.     Cranial Nerves: Cranial nerves 2-12 are intact. No cranial nerve deficit.     Sensory: No sensory deficit.     Motor: Weakness present. No atrophy.     Coordination: Coordination abnormal.     Gait: Gait  abnormal.     Deep Tendon Reflexes: Reflexes normal.     Reflex Scores:      Tricep reflexes are 0 on the right side and 0 on the left side.      Bicep reflexes are 1+ on the right side and 1+ on the left side.      Brachioradialis reflexes are 2+ on the right side and 1+ on the left side.  Patellar reflexes are 1+ on the right side and 1+ on the left side.      Achilles reflexes are 1+ on the right side and 1+ on the left side.    Comments: Weakness in BLE  Psychiatric:        Mood and Affect: Mood normal.        Behavior: Behavior normal.     Lab Results  Component Value Date   WBC 14.8 (H) 04/14/2022   HGB 12.2 04/14/2022   HCT 35.6 (L) 04/14/2022   PLT 585.0 (H) 04/14/2022   GLUCOSE 128 (H) 04/14/2022   CHOL 200 12/26/2021   TRIG 70.0 12/26/2021   HDL 94.10 12/26/2021   LDLDIRECT 87.0 09/16/2012   LDLCALC 92 12/26/2021   ALT 15 04/06/2022   AST 18 04/06/2022   NA 128 (L) 04/14/2022   K 3.2 (L) 04/14/2022   CL 86 (L) 04/14/2022   CREATININE 0.60 04/14/2022   BUN 9 04/14/2022   CO2 28 04/14/2022   TSH 1.10 04/14/2022   INR 1.09 05/19/2012   HGBA1C 5.1 06/26/2021    CT Abdomen Pelvis W Contrast  Result Date: 04/03/2022 CLINICAL DATA:  Abdominal pain EXAM: CT ABDOMEN AND PELVIS WITH CONTRAST TECHNIQUE: Multidetector CT imaging of the abdomen and pelvis was performed using the standard protocol following bolus administration of intravenous contrast. RADIATION DOSE REDUCTION: This exam was performed according to the departmental dose-optimization program which includes automated exposure control, adjustment of the mA and/or kV according to patient size and/or use of iterative reconstruction technique. CONTRAST:  157m OMNIPAQUE IOHEXOL 300 MG/ML  SOLN COMPARISON:  Similar study 09/23/2022. FINDINGS: Lower chest: Lung bases show scattered linear scar-like opacities. The cardiac size is normal. Hepatobiliary: The liver is mildly steatotic without mass enhancement.  Gallbladder is absent without interval worsening biliary dilatation with chronically prominent common bile duct measuring 9 mm. Pancreas: No focal abnormality. Spleen: Stable 4.2 cm splenic cyst with calcified septations superiorly. Otherwise unremarkable normal-sized spleen. Adrenals/Urinary Tract: There is no adrenal mass. Wedge-shaped scarring in the posterior upper right kidney is again noted with a small calyceal diverticulum. Otherwise the renal cortex is unremarkable. There is no urinary stone or obstruction. There is mild bladder thickening versus underdistention. Stomach/Bowel: Mild thickened folds noted in the stomach, distal duodenum and jejunum. The appendix surgically absent, as before. There is moderate stool retention without evidence of colitis or diverticulitis. Diverticulosis most pronounced in the sigmoid segment. Vascular/Lymphatic: No significant vascular findings are present. No enlarged abdominal or pelvic lymph nodes. Reproductive: The uterus is intact. There is a calcified leiomyoma in the posterior body of uterus. No adnexal mass or free fluid. Other: Small umbilical fat hernia. There is no incarcerated hernia. No acute inflammatory change, free air, free hemorrhage or free fluid. Musculoskeletal: Osteopenia and degenerative change lumbar spine, slight dextroscoliosis. Degenerative change lower thoracic spine. No destructive bone lesions. IMPRESSION: 1. Cystitis versus bladder nondistention. 2. Constipation and diverticulosis. No bowel obstruction or inflammatory change. 3. Gastroenteritis without adenopathy or visible mesenteric reaction. 4. 4.2 cm stable splenic cyst. 5. Chronic wedge-shaped scarring of the posterosuperior right kidney with caliceal diverticulum. 6. Mild hepatic steatosis. 7. Aortic atherosclerosis. Electronically Signed   By: KTelford NabM.D.   On: 04/03/2022 00:10    DG Knee Complete 4 Views Right  Result Date: 04/14/2022 CLINICAL DATA:  Pain and swelling. EXAM:  RIGHT KNEE - COMPLETE 4+ VIEW COMPARISON:  Right knee radiographs 07/18/2010 FINDINGS: Minimal medial compartment joint space narrowing. Mild mediolateral compartment chondrocalcinosis.  Mild patellofemoral joint space narrowing and mild superior degenerative osteophytosis. Tiny joint effusion. No acute fracture is seen. No dislocation. IMPRESSION: Mild medial and patellofemoral compartment osteoarthritis. Electronically Signed   By: Yvonne Kendall M.D.   On: 04/14/2022 16:15   CT Abdomen Pelvis W Contrast  Result Date: 04/03/2022 CLINICAL DATA:  Abdominal pain EXAM: CT ABDOMEN AND PELVIS WITH CONTRAST TECHNIQUE: Multidetector CT imaging of the abdomen and pelvis was performed using the standard protocol following bolus administration of intravenous contrast. RADIATION DOSE REDUCTION: This exam was performed according to the departmental dose-optimization program which includes automated exposure control, adjustment of the mA and/or kV according to patient size and/or use of iterative reconstruction technique. CONTRAST:  151m OMNIPAQUE IOHEXOL 300 MG/ML  SOLN COMPARISON:  Similar study 09/23/2022. FINDINGS: Lower chest: Lung bases show scattered linear scar-like opacities. The cardiac size is normal. Hepatobiliary: The liver is mildly steatotic without mass enhancement. Gallbladder is absent without interval worsening biliary dilatation with chronically prominent common bile duct measuring 9 mm. Pancreas: No focal abnormality. Spleen: Stable 4.2 cm splenic cyst with calcified septations superiorly. Otherwise unremarkable normal-sized spleen. Adrenals/Urinary Tract: There is no adrenal mass. Wedge-shaped scarring in the posterior upper right kidney is again noted with a small calyceal diverticulum. Otherwise the renal cortex is unremarkable. There is no urinary stone or obstruction. There is mild bladder thickening versus underdistention. Stomach/Bowel: Mild thickened folds noted in the stomach, distal duodenum  and jejunum. The appendix surgically absent, as before. There is moderate stool retention without evidence of colitis or diverticulitis. Diverticulosis most pronounced in the sigmoid segment. Vascular/Lymphatic: No significant vascular findings are present. No enlarged abdominal or pelvic lymph nodes. Reproductive: The uterus is intact. There is a calcified leiomyoma in the posterior body of uterus. No adnexal mass or free fluid. Other: Small umbilical fat hernia. There is no incarcerated hernia. No acute inflammatory change, free air, free hemorrhage or free fluid. Musculoskeletal: Osteopenia and degenerative change lumbar spine, slight dextroscoliosis. Degenerative change lower thoracic spine. No destructive bone lesions. IMPRESSION: 1. Cystitis versus bladder nondistention. 2. Constipation and diverticulosis. No bowel obstruction or inflammatory change. 3. Gastroenteritis without adenopathy or visible mesenteric reaction. 4. 4.2 cm stable splenic cyst. 5. Chronic wedge-shaped scarring of the posterosuperior right kidney with caliceal diverticulum. 6. Mild hepatic steatosis. 7. Aortic atherosclerosis. Electronically Signed   By: KTelford NabM.D.   On: 04/03/2022 00:10     Assessment & Plan:   [place].  Diagnoses and all orders for this visit:  [place]ifocal motor neuropathy [place]- I will treat the thiamine deficiency.  I have asked her to see neurology as soon as possible.  Will screen for other causes like myasthenia gravis and Lyme disease. -     CBC with Differential/Platelet; Future -     TSH; Future -     C-reactive protein; Future -     Protein electrophoresis, serum; Future -     B. burgdorfi antibodies by WB; Future -     Vitamin B12; Future -     Folate; Future -     Vitamin B1; Future -     Vitamin B1 -     Folate -     Vitamin B12 -     B. burgdorfi antibodies by WB -     Protein electrophoresis, serum -     C-reactive protein -     TSH -     CBC  with Differential/Platelet -  CK; Future -     gabapentin (NEURONTIN) 100 MG capsule; Take 2 capsules (200 mg total) by mouth 2 (two) times daily. -     CK -     Ambulatory referral to Neurology -     Acetylcholine receptor, binding; Future -     Striated muscle antibody; Future  Essential hypertension- Her blood pressure is well controlled but she has electrolyte abnormalities.  I have asked her to stop taking hydrochlorothiazide. -     TSH; Future -     Basic metabolic panel; Future -     Basic metabolic panel -     TSH  Reactive arthritis of right knee ([place])- Plain films show a mild effusion.  Will continue the steroids. -     B. burgdorfi antibodies by WB; Future -     DG Knee Complete 4 Views Right; Future -     B. burgdorfi antibodies by WB  Essential thrombocytosis ([place])- She has an elevated white cell count, elevated platelet count, and hypogammaglobulinemia.  I recommended that she see hematology to be evaluated for plasma cell disorder. -     Ambulatory referral to Hematology / Oncology  Thiamine deficiency neuropathy- Will treat with a high-dose thiamine supplement.   I have discontinued [name] Murdoch "Cindy"'s phenylephrine and hydrochlorothiazide. I have also changed her gabapentin. Additionally, I am having her maintain her cyanocobalamin, cholecalciferol, ibuprofen, calcium citrate, cetirizine, ALPRAZolam, nitrofurantoin (macrocrystal-monohydrate), pravastatin, amLODipine, ciprofloxacin, methocarbamol, doxycycline, predniSONE, and thiamine.  Meds ordered this encounter  Medications   gabapentin (NEURONTIN) 100 MG capsule    Sig: Take 2 capsules (200 mg total) by mouth 2 (two) times daily.    Dispense:  60 capsule    Refill:  0   thiamine (VITAMIN B1) 100 MG tablet    Sig: Take 1 tablet (100 mg total) by mouth daily.    Dispense:  90 tablet    Refill:  0   I spent 50 minutes in preparing to see the patient by review of recent labs and images, obtaining and  reviewing separately obtained history, communicating with the patient, ordering medications, tests or procedures, and documenting clinical information in the EHR including the differential Dx, treatment, and any further evaluation and management of multiple complex medical issues.     Follow-up: No follow-ups on file.  Scarlette Calico, MD

## 2022-04-15 ENCOUNTER — Telehealth: Payer: Self-pay | Admitting: Internal Medicine

## 2022-04-15 ENCOUNTER — Telehealth: Payer: Self-pay | Admitting: Nurse Practitioner

## 2022-04-15 NOTE — Telephone Encounter (Signed)
Scheduled appt per 8/8 referral. Pt is aware of appt date and time. Pt is aware to arrive 15 mins prior to appt time and to bring and updated insurance card. Pt is aware of appt location.   

## 2022-04-15 NOTE — Telephone Encounter (Signed)
Pt wanted to let Dr. Ronnald Ramp know that she got one additional hour more of sleep than usual. She said she woke up around three am. Pt states she feels a knot behind her calf and also in her thigh. Pt stated the gabapentin and tylenol are helping with the pain some.   Fyi

## 2022-04-17 ENCOUNTER — Other Ambulatory Visit (HOSPITAL_BASED_OUTPATIENT_CLINIC_OR_DEPARTMENT_OTHER): Payer: Medicare Other

## 2022-04-18 LAB — B. BURGDORFI ANTIBODIES BY WB
B burgdorferi IgG Abs (IB): NEGATIVE
B burgdorferi IgM Abs (IB): NEGATIVE
Lyme Disease 18 kD IgG: NONREACTIVE
Lyme Disease 23 kD IgG: NONREACTIVE
Lyme Disease 23 kD IgM: NONREACTIVE
Lyme Disease 28 kD IgG: NONREACTIVE
Lyme Disease 30 kD IgG: NONREACTIVE
Lyme Disease 39 kD IgG: NONREACTIVE
Lyme Disease 39 kD IgM: REACTIVE — AB
Lyme Disease 41 kD IgG: NONREACTIVE
Lyme Disease 41 kD IgM: NONREACTIVE
Lyme Disease 45 kD IgG: NONREACTIVE
Lyme Disease 58 kD IgG: NONREACTIVE
Lyme Disease 66 kD IgG: NONREACTIVE
Lyme Disease 93 kD IgG: NONREACTIVE

## 2022-04-18 LAB — PROTEIN ELECTROPHORESIS, SERUM
Albumin ELP: 3.4 g/dL — ABNORMAL LOW (ref 3.8–4.8)
Alpha 1: 0.6 g/dL — ABNORMAL HIGH (ref 0.2–0.3)
Alpha 2: 1.3 g/dL — ABNORMAL HIGH (ref 0.5–0.9)
Beta 2: 0.3 g/dL (ref 0.2–0.5)
Beta Globulin: 0.4 g/dL (ref 0.4–0.6)
Gamma Globulin: 0.5 g/dL — ABNORMAL LOW (ref 0.8–1.7)
Total Protein: 6.6 g/dL (ref 6.1–8.1)

## 2022-04-18 LAB — VITAMIN B1: Vitamin B1 (Thiamine): <6 nmol/L — ABNORMAL LOW (ref 8–30)

## 2022-04-19 ENCOUNTER — Other Ambulatory Visit: Payer: Self-pay | Admitting: Internal Medicine

## 2022-04-19 DIAGNOSIS — E5111 Dry beriberi: Secondary | ICD-10-CM | POA: Insufficient documentation

## 2022-04-19 MED ORDER — THIAMINE HCL 100 MG PO TABS: 100.0000 mg | ORAL_TABLET | Freq: Every day | ORAL | 0 refills | Status: DC

## 2022-04-20 ENCOUNTER — Telehealth: Payer: Self-pay | Admitting: Internal Medicine

## 2022-04-22 ENCOUNTER — Inpatient Hospital Stay: Payer: Medicare Other | Attending: Nurse Practitioner | Admitting: Nurse Practitioner

## 2022-04-22 ENCOUNTER — Telehealth: Payer: Self-pay

## 2022-04-22 ENCOUNTER — Inpatient Hospital Stay: Payer: Medicare Other

## 2022-04-22 VITALS — BP 144/68 | HR 99 | Temp 98.3°F | Resp 15 | Ht 59.0 in | Wt 108.2 lb

## 2022-04-22 DIAGNOSIS — D802 Selective deficiency of immunoglobulin A [IgA]: Secondary | ICD-10-CM | POA: Insufficient documentation

## 2022-04-22 DIAGNOSIS — D801 Nonfamilial hypogammaglobulinemia: Secondary | ICD-10-CM | POA: Insufficient documentation

## 2022-04-22 DIAGNOSIS — D510 Vitamin B12 deficiency anemia due to intrinsic factor deficiency: Secondary | ICD-10-CM | POA: Insufficient documentation

## 2022-04-22 DIAGNOSIS — D75839 Thrombocytosis, unspecified: Secondary | ICD-10-CM

## 2022-04-22 DIAGNOSIS — D649 Anemia, unspecified: Secondary | ICD-10-CM

## 2022-04-22 DIAGNOSIS — N390 Urinary tract infection, site not specified: Secondary | ICD-10-CM | POA: Diagnosis not present

## 2022-04-22 DIAGNOSIS — R531 Weakness: Secondary | ICD-10-CM | POA: Diagnosis not present

## 2022-04-22 DIAGNOSIS — E519 Thiamine deficiency, unspecified: Secondary | ICD-10-CM | POA: Diagnosis not present

## 2022-04-22 NOTE — Telephone Encounter (Signed)
Pt states that she doesn't see Neuro until 06/16/22 but Hematology was going to try and see if they could work her in sooner.

## 2022-04-22 NOTE — Telephone Encounter (Signed)
Pt is calling to report the following to Dr. Ronnald Ramp:  Pt was advised to take Vit B 1 for 1 wk and go back to Hematology next week for repeat labs.  Pt report taking last Prednisone on Monday 8/14 and has started feeling some discomfort. Pt states it seemed to improve when she first started the Prednisone but not over time. Pt is still taking gabapentin (NEURONTIN) 100 MG capsule. Pt reports its still painful to walk and muscle control is not very good.

## 2022-04-22 NOTE — Progress Notes (Signed)
[place]   [phone] Fax:([phone]) [place] consult Note   Patient Care Team: [name], MD as PCP - General (Internal Medicine) Thurman Coyer, DO as Consulting Physician (Sports Medicine) Maisie Fus, MD (Inactive) (Obstetrics and Gynecology) Kristeen Miss, MD (Neurosurgery) Hillary Bow, MD (Cardiology) Gatha Mayer, MD (Gastroenterology) Charlton Haws, [facility] as Pharmacist (Pharmacist) Date of Service: 04/22/2022  CHIEF COMPLAINTS/PURPOSE OF CONSULTATION:  Leukocytosis and thrombocytosis, referred by Dr. Scarlette Calico, primary care  HISTORY OF PRESENTING ILLNESS:  [name] 71 y.o. female with past medical history including IgA deficiency (2009) B12 deficiency and pernicious anemia, thiamine deficiency, gluten intolerance, IBS, HTN, HL, osteoporosis, and recurrent UTIs is here because of elevated white blood cell and platelet counts.  She was in her usual state of good health until early July 2023 when she removed a tick from behind her right ankle, she did not think it had been there long as it was not full and easy to remove. She developed a recurrent UTI 03/26/22 (last was 11/2020, on prophylactic macrobid) and completed a course of cipro.  During or just after completing Cipro she began aching all over with back and abdominal pain, leg swelling, and weakness.  She was seen in the ED 04/02/2022, CBC showed WBC 13.0 otherwise normal. CT showed constipation and enteritis.  She was given prednisone and gabapentin, steroids helped her leg swelling.  She was seeing Dr. Celso Amy but changed to Dr. Scarlette Calico while Dr. Quay Burow was out of the office. Repeat CBC 04/06/22 showed WBC 11.7, and again on 04/14/22 WBC 14.8 with neutrophilia and plt 585K. Other labs 04/14/22 showed normal CK, sed rate, TSH and hypogammaglobulinemia. Thiamine was <6, she has been on oral thiamine (B1) for 1 week. Lyme panel was overall negative. An appointment  with neurologist Dr. Marcial Pacas is scheduled on 06/16/22.    Socially, she is married and lives with her spouse. She has 2 healthy adult children. She was healthy and active prior to this illness, walking 3-5 miles daily and doing [place]. Now she finds it difficult to walk and wash her hair due to pain and weakness. She is reportedly up to date on cancer screenings. She is a never smoker and denies drug use. She has 1 glass of wine at night but none in the past few weeks. Family history is significant for mother with lung cancer, father with lung cancer and non-hodgkin's lymphoma, and a brother who had hair cell leukemia.   Today she presents with her spouse. For past 5-6 weeks she developed arm weakness, decreased vision, hot and cold fluctuation, taste change and low appetite with 7 pounds weight loss, nausea without vomiting, and daytime tiredness with poor sleep at night.  She denies fever, chills, persistent cough, chest pain, dyspnea, h/o thrombosis, bleeding, or lymphadenopathy.    MEDICAL HISTORY:  Past Medical History:  Diagnosis Date   ALLERGIC RHINITIS    ANEMIA, PERNICIOUS    ANXIETY    BILIARY DYSKINESIA    CARPAL TUNNEL SYNDROME, LEFT    DYSLIPIDEMIA    Headache(784.0)    HYPERTENSION    Irritable bowel syndrome    Non-celiac gluten sensitivity    OSTEOPENIA    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HCC)    borderline    SURGICAL HISTORY: Past Surgical History:  Procedure Laterality Date   APPENDECTOMY     CARPAL TUNNEL RELEASE     CESAREAN SECTION  76 &  14   x's 2    CHOLECYSTECTOMY  2005   Dr. Dalbert Batman (Gallbladder dyskinesia)   COLONOSCOPY     DILATION AND CURETTAGE OF UTERUS     LEFT HEART CATHETERIZATION WITH CORONARY ANGIOGRAM N/A 05/19/2012   Procedure: LEFT HEART CATHETERIZATION WITH CORONARY ANGIOGRAM;  Surgeon: Hillary Bow, MD;  Location: [facility] CATH LAB;  Service: Cardiovascular;  Laterality: N/A;   ROTATOR CUFF REPAIR     TONSILLECTOMY     UPPER  GASTROINTESTINAL ENDOSCOPY      SOCIAL HISTORY: Social History   Socioeconomic History   Marital status: Married    Spouse name: Not on file   Number of children: Not on file   Years of education: Not on file   Highest education level: Not on file  Occupational History   Occupation: Dental office/ Retired    Fish farm manager: [employer]  Tobacco Use   Smoking status: Never   Smokeless tobacco: Never  Vaping Use   Vaping Use: Never used  Substance and Sexual Activity   Alcohol use: Yes    Alcohol/week: 1.0 standard drink of alcohol    Types: 1 Glasses of wine per week    Comment: occassional   Drug use: No   Sexual activity: Yes    Birth control/protection: Post-menopausal  Other Topics Concern   Not on file  Social History Narrative   Married and lives with husband.    Dental hygienist - retired   Scientist, physiological Strain: [place]  (11/24/2021)   Overall Financial Resource Strain (CARDIA)    Difficulty of Paying Living Expenses: Not hard at all  Food Insecurity: No Food Insecurity (11/24/2021)   Hunger Vital Sign    Worried About Running Out of Food in the Last Year: Never true    [place] in the Last Year: Never true  Transportation Needs: No Transportation Needs (11/24/2021)   PRAPARE - Hydrologist (Medical): No    Lack of Transportation (Non-Medical): No  Physical Activity: Sufficiently Active (11/24/2021)   Exercise Vital Sign    Days of Exercise per Week: 6 days    Minutes of Exercise per Session: 60 min  Stress: No Stress Concern Present (11/24/2021)   [place]    Feeling of Stress : Not at all  Social Connections: Moderately Isolated (11/24/2021)   Social Connection and Isolation Panel [NHANES]    Frequency of Communication with Friends and Family: Three times a week    Frequency of Social Gatherings with Friends and Family:  Three times a week    Attends Religious Services: Never    Active Member of Clubs or Organizations: No    Attends Archivist Meetings: Never    Marital Status: Married  Human resources officer Violence: Not At Risk (11/24/2021)   Humiliation, Afraid, Rape, and Kick questionnaire    Fear of Current or Ex-Partner: No    Emotionally Abused: No    Physically Abused: No    Sexually Abused: No    FAMILY HISTORY: Family History  Problem Relation Age of Onset   Colitis Mother    Heart disease Mother    Hyperlipidemia Mother    Lung cancer Mother        Cause of death   Hypertension Mother    Lung cancer Father    Heart disease Brother    Heart attack Brother    Heart  disease Brother    Valvular heart disease Brother    Heart attack Brother    Atrial fibrillation Brother    Heart failure Brother    Miscarriages / Stillbirths Brother    Valvular heart disease Brother    Leukemia Brother    Coronary artery disease Brother        MI in late 47's. Still living   Coronary artery disease Brother        MI in 71's. Also still living.   Ovarian cancer Maternal Aunt    Diabetes Maternal Aunt    Colon cancer Neg Hx    Colon polyps Neg Hx     ALLERGIES:  is allergic to fosamax [alendronate sodium], hydrocodone, hydrocodone-acetaminophen, hydrocodone-acetaminophen, oxycodone-acetaminophen, oxycodone-acetaminophen, and tizanidine.  MEDICATIONS:  Current Outpatient Medications  Medication Sig Dispense Refill   ALPRAZolam (XANAX) 0.25 MG tablet TAKE 1 TABLET(0.25 MG) BY MOUTH TWICE DAILY AS NEEDED FOR ANXIETY (Patient taking differently: Take 0.25 mg by mouth 2 (two) times daily as needed for anxiety.) 60 tablet 0   amLODipine (NORVASC) 10 MG tablet TAKE 1 TABLET(10 MG) BY MOUTH DAILY (Patient taking differently: Take 10 mg by mouth daily.) 90 tablet 1   cetirizine (ZYRTEC) 10 MG tablet Take 10 mg by mouth daily.     cholecalciferol (VITAMIN D3) 25 MCG (1000 UT) tablet Take 1,000 Units  by mouth 2 (two) times daily.     gabapentin (NEURONTIN) 100 MG capsule Take 2 capsules (200 mg total) by mouth 2 (two) times daily. 60 capsule 0   pravastatin (PRAVACHOL) 40 MG tablet Take 1 tablet (40 mg total) by mouth daily. 90 tablet 2   thiamine (VITAMIN B1) 100 MG tablet Take 1 tablet (100 mg total) by mouth daily. 90 tablet 0   ibuprofen (ADVIL) 200 MG tablet Take 200 mg by mouth every 8 (eight) hours as needed for fever or headache. (Patient not taking: Reported on 04/22/2022)     methocarbamol (ROBAXIN) 500 MG tablet TAKE 1 TABLET(500 MG) BY MOUTH TWICE DAILY AS NEEDED FOR MUSCLE SPASMS (Patient not taking: Reported on 04/22/2022) 30 tablet 0   No current facility-administered medications for this visit.    REVIEW OF SYSTEMS:   Constitutional: Denies fevers, chills or abnormal night sweats (+) temperature fluctuation (+) decreased appetite (+) mild weight loss (+) fatigue  Eyes: Denies blurriness of vision, double vision or watery eyes (+) decreased vision  Ears, nose, mouth, throat, and face: Denies mucositis or sore throat Respiratory: Denies cough, dyspnea or wheezes Cardiovascular: Denies palpitation, chest discomfort (+) leg swelling Gastrointestinal:  Denies vomiting, hematochezia, heartburn or change in bowel habits (+) nausea Skin: Denies abnormal skin rashes Lymphatics: Denies new lymphadenopathy or easy bruising Neurological:Denies numbness, tingling (+)  weaknesses Behavioral/Psych: Mood is stable, no new changes  All other systems were reviewed with the patient and are negative.  PHYSICAL EXAMINATION: ECOG PERFORMANCE STATUS: 2 - Symptomatic, <50% confined to bed  Vitals:   04/22/22 1112  BP: (!) 144/68  Pulse: 99  Resp: 15  Temp: 98.3 F (36.8 C)  SpO2: 100%   Filed Weights   04/22/22 1112  Weight: 108 lb 3.2 oz (49.1 kg)    GENERAL:alert, no distress and comfortable SKIN: no rash EYES: sclera clear NECK: without mass LYMPH:  no palpable cervical,  supraclavicular, or axillary lymphadenopathy  LUNGS: clear with normal breathing effort HEART: regular rate & rhythm, no lower extremity edema ABDOMEN:abdomen soft, non-tender and normal bowel sounds. No hepatosplenomegaly or mass Musculoskeletal: 4/5 strength  bilaterally   PSYCH: alert & oriented x 3 with fluent speech NEURO: no focal sensory deficits  LABORATORY DATA:  I have reviewed the data as listed    Latest Ref Rng & Units 04/14/2022   11:35 AM 04/06/2022    3:50 PM 04/02/2022    6:06 PM  CBC  WBC 4.0 - 10.5 K/uL 14.8  11.7  13.0   Hemoglobin 12.0 - 15.0 g/dL 12.2  12.9  13.0   Hematocrit 36.0 - 46.0 % 35.6  37.3  36.7   Platelets 150.0 - 400.0 K/uL 585.0  281.0  239        Latest Ref Rng & Units 04/14/2022   11:35 AM 04/06/2022    3:50 PM 04/02/2022    6:06 PM  CMP  Glucose 70 - 99 mg/dL 128  95  101   BUN 6 - 23 mg/dL $Remove'9  6  7   'JoKypdn$ Creatinine 0.40 - 1.20 mg/dL 0.60  0.59  0.63   Sodium 135 - 145 mEq/L 128  128  133   Potassium 3.5 - 5.1 mEq/L 3.2  3.0  3.8   Chloride 96 - 112 mEq/L 86  85  93   CO2 19 - 32 mEq/L $Remove'28  30  27   'EiovbPW$ Calcium 8.4 - 10.5 mg/dL 10.0  9.9  9.7   Total Protein 6.1 - 8.1 g/dL 6.6  7.9  7.6   Total Bilirubin 0.2 - 1.2 mg/dL  0.7  0.9   Alkaline Phos 39 - 117 U/L  80  64   AST 0 - 37 U/L  18  17   ALT 0 - 35 U/L  15  18      RADIOGRAPHIC STUDIES: I have personally reviewed the radiological images as listed and agreed with the findings in the report. DG Knee Complete 4 Views Right  Result Date: 04/14/2022 CLINICAL DATA:  Pain and swelling. EXAM: RIGHT KNEE - COMPLETE 4+ VIEW COMPARISON:  Right knee radiographs 07/18/2010 FINDINGS: Minimal medial compartment joint space narrowing. Mild mediolateral compartment chondrocalcinosis. Mild patellofemoral joint space narrowing and mild superior degenerative osteophytosis. Tiny joint effusion. No acute fracture is seen. No dislocation. IMPRESSION: Mild medial and patellofemoral compartment osteoarthritis.  Electronically Signed   By: Yvonne Kendall M.D.   On: 04/14/2022 16:15   CT Abdomen Pelvis W Contrast  Result Date: 04/03/2022 CLINICAL DATA:  Abdominal pain EXAM: CT ABDOMEN AND PELVIS WITH CONTRAST TECHNIQUE: Multidetector CT imaging of the abdomen and pelvis was performed using the standard protocol following bolus administration of intravenous contrast. RADIATION DOSE REDUCTION: This exam was performed according to the departmental dose-optimization program which includes automated exposure control, adjustment of the mA and/or kV according to patient size and/or use of iterative reconstruction technique. CONTRAST:  141mL OMNIPAQUE IOHEXOL 300 MG/ML  SOLN COMPARISON:  Similar study 09/23/2022. FINDINGS: Lower chest: Lung bases show scattered linear scar-like opacities. The cardiac size is normal. Hepatobiliary: The liver is mildly steatotic without mass enhancement. Gallbladder is absent without interval worsening biliary dilatation with chronically prominent common bile duct measuring 9 mm. Pancreas: No focal abnormality. Spleen: Stable 4.2 cm splenic cyst with calcified septations superiorly. Otherwise unremarkable normal-sized spleen. Adrenals/Urinary Tract: There is no adrenal mass. Wedge-shaped scarring in the posterior upper right kidney is again noted with a small calyceal diverticulum. Otherwise the renal cortex is unremarkable. There is no urinary stone or obstruction. There is mild bladder thickening versus underdistention. Stomach/Bowel: Mild thickened folds noted in the stomach, distal duodenum and jejunum. The  appendix surgically absent, as before. There is moderate stool retention without evidence of colitis or diverticulitis. Diverticulosis most pronounced in the sigmoid segment. Vascular/Lymphatic: No significant vascular findings are present. No enlarged abdominal or pelvic lymph nodes. Reproductive: The uterus is intact. There is a calcified leiomyoma in the posterior body of uterus. No  adnexal mass or free fluid. Other: Small umbilical fat hernia. There is no incarcerated hernia. No acute inflammatory change, free air, free hemorrhage or free fluid. Musculoskeletal: Osteopenia and degenerative change lumbar spine, slight dextroscoliosis. Degenerative change lower thoracic spine. No destructive bone lesions. IMPRESSION: 1. Cystitis versus bladder nondistention. 2. Constipation and diverticulosis. No bowel obstruction or inflammatory change. 3. Gastroenteritis without adenopathy or visible mesenteric reaction. 4. 4.2 cm stable splenic cyst. 5. Chronic wedge-shaped scarring of the posterosuperior right kidney with caliceal diverticulum. 6. Mild hepatic steatosis. 7. Aortic atherosclerosis. Electronically Signed   By: Telford Nab M.D.   On: 04/03/2022 00:10    ASSESSMENT & PLAN: 71 yo female  1.Leukocytosis with predominant neutrophilia, and thrombocytosis -We reviewed her medical record in detail with the patient and her spouse. She had recurrent UTI, developed side effects with Cipro and received a course of prednisone recently -WBC up to 14 with elevated neutrophils, Plt 585K on 04/14/22, likely reactive from steroids  -during her work up she was found to have hypogammaglobulinemia and thiamine deficiency and has been on thiamine for 1 week, we discussed nutritional deficiencies can possibly account for blood count discrepancy -Recent imaging is negative for splenomegaly or occult malignancy - This is not the typical course for MPN, we are holding on MPN panel and bone marrow biopsy at this time -We will repeat lab (CBC, SPEP, light chains, iron panel) in 2-3 weeks and call with results, if labs are improved she can f/up with PCP -Patient seen with Dr. Burr Medico   2.Weakness, body pain, leg edema, nausea, anorexia with weight loss, vision change, hot/cold intolerance  -onset in past 5-6 weeks after course of Cipro for UTI -Multiple labs have been unrevealing including CK, sed rate, ANA,  etc. Per PCP -we spoke to ID Dr. Elba Barman who felt her lyme work up was overall negative  -Exam shows weakness, otherwise nonfocal -She is scheduled with Neurologist Dr. Krista Blue in October, will call to see if she can be seen sooner -We do not feel her blood count abnormalities are contributing to these symptoms   PLAN: -medical record reviewed  - lab late next week, will call with results  -continue thiamine -defer further symptom management/work up to PCP -will contact neuro Dr. Krista Blue to see if she can move up consult  -F/up pending results   Orders Placed This Encounter  Procedures   CBC with Differential ([place] Only)    Standing Status:   Future    Standing Expiration Date:   04/23/2023   Ferritin    Standing Status:   Future    Standing Expiration Date:   04/23/2023   Iron and Iron Binding Capacity ([facility] only)    Standing Status:   Future    Standing Expiration Date:   04/23/2023   SPEP with reflex to IFE    Standing Status:   Future    Standing Expiration Date:   04/22/2023   Kappa/lambda light chains    Standing Status:   Future    Standing Expiration Date:   04/22/2023     All questions were answered. The patient knows to call the clinic with any problems, questions or  concerns.     Alla Feeling, NP 04/24/22    Addendum  I have seen the patient, examined her. I agree with the assessment and and plan and have edited the notes.   71 yo female with PMH of pernicious anemia, thiamine deficiency, IgA deficiency, HTN, presented with worsening body pain, leg edema and weakness and multiple other symptoms. She was referred for leukocytosis with predominant neutrophil, thrombocytosis. This is likely reactive process, given her recent course of steroid. Her lab work revealed low vitB1, and she just started thiamine replacement yesterday.  I recommend repeating CBC in a week, will check her iron study, With immunofixation, and serum light chain level.  Recent CT abdomen pelvis  was unremarkable, no adenopathy or splenomegaly.  Low suspicion for myeloproliferative neoplasm, will monitor her blood counts, hold on MPN molecular panel for now,and no need for bone marrow biopsy at this point. We will call her with lab results next week. I will reach out to Dr. Krista Blue to see if she can see her sooner.   Truitt Merle MD 04/22/2022

## 2022-04-24 ENCOUNTER — Encounter: Payer: Self-pay | Admitting: Nurse Practitioner

## 2022-04-24 ENCOUNTER — Telehealth: Payer: Self-pay | Admitting: Internal Medicine

## 2022-04-24 NOTE — Telephone Encounter (Signed)
Pt is calling to report the following to Dr. Ronnald Ramp:  Pt states that the top of her head is tender.  She also stated that for a couple of weeks she has been experiencing cold spells. She said she get really cold and starts shaking with the chills. She said she bundles up and then she gets extremely hot. She said she has not had a fever or anything like that.

## 2022-04-25 ENCOUNTER — Encounter: Payer: Self-pay | Admitting: Internal Medicine

## 2022-04-28 ENCOUNTER — Other Ambulatory Visit: Payer: Self-pay | Admitting: Internal Medicine

## 2022-04-28 ENCOUNTER — Telehealth: Payer: Self-pay | Admitting: Internal Medicine

## 2022-04-28 DIAGNOSIS — G6182 Multifocal motor neuropathy: Secondary | ICD-10-CM

## 2022-04-28 MED ORDER — GABAPENTIN 100 MG PO CAPS: 200.0000 mg | ORAL_CAPSULE | Freq: Two times a day (BID) | ORAL | 0 refills | Status: DC

## 2022-04-28 NOTE — Telephone Encounter (Signed)
Pt called to advise Dr. Ronnald Ramp that she has been feeling extremely bad, not sleeping, and still finding difficulty in walking. She said she hurts all over.

## 2022-04-29 ENCOUNTER — Other Ambulatory Visit: Payer: Self-pay | Admitting: Internal Medicine

## 2022-04-29 DIAGNOSIS — G6182 Multifocal motor neuropathy: Secondary | ICD-10-CM

## 2022-04-29 NOTE — Telephone Encounter (Signed)
Spoke with patient today and info provided.  She reached out to them today and they are going to work on getting her seen sooner.

## 2022-04-30 ENCOUNTER — Inpatient Hospital Stay: Payer: Medicare Other

## 2022-04-30 DIAGNOSIS — D510 Vitamin B12 deficiency anemia due to intrinsic factor deficiency: Secondary | ICD-10-CM | POA: Diagnosis not present

## 2022-04-30 DIAGNOSIS — D802 Selective deficiency of immunoglobulin A [IgA]: Secondary | ICD-10-CM | POA: Diagnosis not present

## 2022-04-30 DIAGNOSIS — R531 Weakness: Secondary | ICD-10-CM | POA: Diagnosis not present

## 2022-04-30 DIAGNOSIS — D801 Nonfamilial hypogammaglobulinemia: Secondary | ICD-10-CM | POA: Diagnosis not present

## 2022-04-30 DIAGNOSIS — N390 Urinary tract infection, site not specified: Secondary | ICD-10-CM | POA: Diagnosis not present

## 2022-04-30 DIAGNOSIS — E519 Thiamine deficiency, unspecified: Secondary | ICD-10-CM | POA: Diagnosis not present

## 2022-04-30 DIAGNOSIS — D649 Anemia, unspecified: Secondary | ICD-10-CM

## 2022-04-30 LAB — IRON AND IRON BINDING CAPACITY (CC-WL,HP ONLY)
Iron: 5 ug/dL — ABNORMAL LOW (ref 28–170)
Saturation Ratios: 2 % — ABNORMAL LOW (ref 10.4–31.8)
TIBC: 211 ug/dL — ABNORMAL LOW (ref 250–450)
UIBC: 206 ug/dL (ref 148–442)

## 2022-04-30 LAB — CBC WITH DIFFERENTIAL (CANCER CENTER ONLY)
Abs Immature Granulocytes: 0.03 10*3/uL (ref 0.00–0.07)
Basophils Absolute: 0 10*3/uL (ref 0.0–0.1)
Basophils Relative: 0 %
Eosinophils Absolute: 0.1 10*3/uL (ref 0.0–0.5)
Eosinophils Relative: 1 %
HCT: 29.7 % — ABNORMAL LOW (ref 36.0–46.0)
Hemoglobin: 10.1 g/dL — ABNORMAL LOW (ref 12.0–15.0)
Immature Granulocytes: 0 %
Lymphocytes Relative: 12 %
Lymphs Abs: 1 10*3/uL (ref 0.7–4.0)
MCH: 30.1 pg (ref 26.0–34.0)
MCHC: 34 g/dL (ref 30.0–36.0)
MCV: 88.4 fL (ref 80.0–100.0)
Monocytes Absolute: 0.6 10*3/uL (ref 0.1–1.0)
Monocytes Relative: 7 %
Neutro Abs: 7.1 10*3/uL (ref 1.7–7.7)
Neutrophils Relative %: 80 %
Platelet Count: 517 10*3/uL — ABNORMAL HIGH (ref 150–400)
RBC: 3.36 MIL/uL — ABNORMAL LOW (ref 3.87–5.11)
RDW: 12.4 % (ref 11.5–15.5)
WBC Count: 8.8 10*3/uL (ref 4.0–10.5)
nRBC: 0 % (ref 0.0–0.2)

## 2022-04-30 LAB — FERRITIN: Ferritin: 350 ng/mL — ABNORMAL HIGH (ref 11–307)

## 2022-05-01 LAB — KAPPA/LAMBDA LIGHT CHAINS
Kappa free light chain: 13.6 mg/L (ref 3.3–19.4)
Kappa, lambda light chain ratio: 1.55 (ref 0.26–1.65)
Lambda free light chains: 8.8 mg/L (ref 5.7–26.3)

## 2022-05-04 ENCOUNTER — Encounter: Payer: Self-pay | Admitting: Internal Medicine

## 2022-05-04 ENCOUNTER — Telehealth: Payer: Self-pay

## 2022-05-04 ENCOUNTER — Other Ambulatory Visit: Payer: Self-pay | Admitting: Internal Medicine

## 2022-05-04 DIAGNOSIS — E876 Hypokalemia: Secondary | ICD-10-CM

## 2022-05-04 LAB — PROTEIN ELECTROPHORESIS, SERUM, WITH REFLEX
A/G Ratio: 0.9 (ref 0.7–1.7)
Albumin ELP: 2.7 g/dL — ABNORMAL LOW (ref 2.9–4.4)
Alpha-1-Globulin: 0.5 g/dL — ABNORMAL HIGH (ref 0.0–0.4)
Alpha-2-Globulin: 1.1 g/dL — ABNORMAL HIGH (ref 0.4–1.0)
Beta Globulin: 0.8 g/dL (ref 0.7–1.3)
Gamma Globulin: 0.5 g/dL (ref 0.4–1.8)
Globulin, Total: 2.9 g/dL (ref 2.2–3.9)
Total Protein ELP: 5.6 g/dL — ABNORMAL LOW (ref 6.0–8.5)

## 2022-05-04 NOTE — Telephone Encounter (Signed)
Pt LVM stating the she would like to speak with Cira Rue, NP in regards to her recent lab results.  Notified Lacie of the pt's request for her to call her.

## 2022-05-05 ENCOUNTER — Encounter: Payer: Self-pay | Admitting: Nurse Practitioner

## 2022-05-07 ENCOUNTER — Encounter: Payer: Self-pay | Admitting: Nurse Practitioner

## 2022-05-07 NOTE — Progress Notes (Signed)
I called [name] to review her labs. She is feeling ok overall, eating a little more. Thinks maybe gradually getting better. She has neuro consult next week.   I reviewed her labs which show mild normocytic anemia and thrombocytosis, low serum iron/transferrin with low TIBC and elevated ferritin. Some immunoglobulin discrepancies on SPEP but no M spike, and normal light chains. She does not have multiple myeloma. Overall the findings suggest a reactive/inflammatory response. She does not have true iron deficiency but may take multivitamin if it helps. Continue thiamine.    We have not ruled out primary bone marrow disorder, but felt to be less likely given the overall findings.  We are not recommending a bone marrow biopsy at this time.  I recommend to have PCP draw striated muscle antibodies and acetylcholine receptor binding labs which were previously ordered by Dr. Ronnald Ramp.  Proceed with neuro and rheumatology consults.  We will ask PCP to repeat CBC with diff at next visit in October, then we will see her back here in 6 months, or sooner if she has worsening CBC changes.   Patient understands, agrees with the plan, and appreciates the call.  I discussed the case with Dr. Burr Medico.   [name] Rue, NP

## 2022-05-12 ENCOUNTER — Other Ambulatory Visit (INDEPENDENT_AMBULATORY_CARE_PROVIDER_SITE_OTHER): Payer: Medicare Other

## 2022-05-12 ENCOUNTER — Other Ambulatory Visit: Payer: Self-pay | Admitting: Internal Medicine

## 2022-05-12 DIAGNOSIS — D72829 Elevated white blood cell count, unspecified: Secondary | ICD-10-CM

## 2022-05-12 DIAGNOSIS — G6182 Multifocal motor neuropathy: Secondary | ICD-10-CM | POA: Diagnosis not present

## 2022-05-12 DIAGNOSIS — E876 Hypokalemia: Secondary | ICD-10-CM | POA: Diagnosis not present

## 2022-05-12 LAB — BASIC METABOLIC PANEL
BUN: 4 mg/dL — ABNORMAL LOW (ref 6–23)
CO2: 27 mEq/L (ref 19–32)
Calcium: 9.3 mg/dL (ref 8.4–10.5)
Chloride: 96 mEq/L (ref 96–112)
Creatinine, Ser: 0.47 mg/dL (ref 0.40–1.20)
GFR: 97.35 mL/min (ref >60.00)
Glucose, Bld: 91 mg/dL (ref 70–99)
Potassium: 3.6 mEq/L (ref 3.5–5.1)
Sodium: 135 mEq/L (ref 135–145)

## 2022-05-12 LAB — CBC WITH DIFFERENTIAL/PLATELET
Basophils Absolute: 0 10*3/uL (ref 0.0–0.1)
Basophils Relative: 0.4 % (ref 0.0–3.0)
Eosinophils Absolute: 0.1 10*3/uL (ref 0.0–0.7)
Eosinophils Relative: 0.9 % (ref 0.0–5.0)
HCT: 30.5 % — ABNORMAL LOW (ref 36.0–46.0)
Hemoglobin: 10.3 g/dL — ABNORMAL LOW (ref 12.0–15.0)
Lymphocytes Relative: 13.7 % (ref 12.0–46.0)
Lymphs Abs: 1 10*3/uL (ref 0.7–4.0)
MCHC: 33.9 g/dL (ref 30.0–36.0)
MCV: 88.5 fl (ref 78.0–100.0)
Monocytes Absolute: 0.7 10*3/uL (ref 0.1–1.0)
Monocytes Relative: 9.4 % (ref 3.0–12.0)
Neutro Abs: 5.5 10*3/uL (ref 1.4–7.7)
Neutrophils Relative %: 75.6 % (ref 43.0–77.0)
Platelets: 536 10*3/uL — ABNORMAL HIGH (ref 150.0–400.0)
RBC: 3.44 Mil/uL — ABNORMAL LOW (ref 3.87–5.11)
RDW: 13.3 % (ref 11.5–15.5)
WBC: 7.3 10*3/uL (ref 4.0–10.5)

## 2022-05-14 ENCOUNTER — Telehealth: Payer: Self-pay | Admitting: Neurology

## 2022-05-14 ENCOUNTER — Encounter: Payer: Self-pay | Admitting: Neurology

## 2022-05-14 ENCOUNTER — Ambulatory Visit (INDEPENDENT_AMBULATORY_CARE_PROVIDER_SITE_OTHER): Payer: Medicare Other | Admitting: Neurology

## 2022-05-14 VITALS — BP 139/74 | HR 78 | Ht 59.5 in | Wt 102.4 lb

## 2022-05-14 DIAGNOSIS — E519 Thiamine deficiency, unspecified: Secondary | ICD-10-CM

## 2022-05-14 DIAGNOSIS — R269 Unspecified abnormalities of gait and mobility: Secondary | ICD-10-CM

## 2022-05-14 NOTE — Telephone Encounter (Signed)
medicare NPR sent to GI

## 2022-05-14 NOTE — Progress Notes (Signed)
Chief Complaint  Patient presents with   New Patient (Initial Visit)    Rm 15 with husband. Reports she is here to discuss worsening neuropathy. Sx have been on going for sometime now.       ASSESSMENT AND PLAN  [name] is a 71 y.o. female    Bilateral lower extremity Sensation Worsening neck pain, limited range of motion of neck  Brisk knee reflex,  known history of cervical degenerative changes,  MRI of cervical spine to rule out cervical spondylitic neuropathy  Low thiamine by previous laboratory evaluation, repeat level  DIAGNOSTIC DATA (LABS, IMAGING, TESTING) - I reviewed patient records, labs, notes, testing and imaging myself where available.   MEDICAL HISTORY:  [name] is a 71 year old female, seen in request by her primary care physician Dr. Quay Burow, [place], for evaluation of bilateral lower extremity heaviness, discomfort, initial evaluation was on May 14, 2022  I reviewed and summarized the referring note. PMHX. Anxiety HTN HLD Anemia pernicious Selective Ig A immunodeficiency Right rotator cuff   She used to be active, able to walk 3 -6 miles each day, around July 2023, she began to noticed bilateral lower extremity heaviness sensation, around that time, she was found to have a tick bite, was treated with doxycycline,  But her lower extremity symptoms continued, she felt heavy from knee down, legs giveaway, he denies sensory loss, denies bowel and bladder incontinence,  She was recently seen by hematologist for leukocytosis with predominant neutrophils, and thrombocytosis, hypoproteinemia, thiamine deficiency, level was less than 6, she denies history of alcohol use  She has a long history of chronic neck pain, limited range of motion of neck turning,  Personally reviewed cervical x-ray in May 2020, advanced cervical spondylosis, bilateral neuroforaminal narrowing most advanced at C5-6, C6-7,   PHYSICAL EXAM:   Vitals:   05/14/22  1501  BP: 139/74  Pulse: 78  Weight: 102 lb 6 oz (46.4 kg)  Height: 4' 11.5" (1.511 m)     Body mass index is 20.33 kg/m.  PHYSICAL EXAMNIATION:  Gen: NAD, conversant, well nourised, well groomed                     Cardiovascular: Regular rate rhythm, no peripheral edema, warm, nontender. Eyes: Conjunctivae clear without exudates or hemorrhage Neck: Supple, no carotid bruits. Pulmonary: Clear to auscultation bilaterally   NEUROLOGICAL EXAM:  MENTAL STATUS: Speech/cognition: Awake, alert, oriented to history taking and casual conversation CRANIAL NERVES: CN II: Visual fields are full to confrontation. Pupils are round equal and briskly reactive to light. CN III, IV, VI: extraocular movement are normal. No ptosis. CN V: Facial sensation is intact to light touch CN VII: Face is symmetric with normal eye closure  CN VIII: Hearing is normal to causal conversation. CN IX, X: Phonation is normal. CN XI: Head turning and shoulder shrug are intact  MOTOR: There is no pronator drift of out-stretched arms. Muscle bulk and tone are normal. Muscle strength is normal.  REFLEXES: Reflexes are 2+ and symmetric at the biceps, triceps, knees, and ankles. Plantar responses are flexor.  SENSORY: Intact to light touch, pinprick and vibratory sensation are intact in fingers and toes.  COORDINATION: There is no trunk or limb dysmetria noted.  GAIT/STANCE: Able to get up from seated position arm crossed, cautious, stiff,  REVIEW OF SYSTEMS:  Full 14 system review of systems performed and notable only for as above All other review of systems were negative.  ALLERGIES: Allergies  Allergen Reactions   Fosamax [Alendronate Sodium]     GI intolerance remotely   Hydrocodone Itching    Other reaction(s): Unknown   Hydrocodone-Acetaminophen     REACTION: itching   Hydrocodone-Acetaminophen     Other reaction(s): Unknown   Oxycodone-Acetaminophen     REACTION: itching    Oxycodone-Acetaminophen    Tizanidine Other (See Comments)    Made her feel like she could not swallow    HOME MEDICATIONS: Current Outpatient Medications  Medication Sig Dispense Refill   ALPRAZolam (XANAX) 0.25 MG tablet TAKE 1 TABLET(0.25 MG) BY MOUTH TWICE DAILY AS NEEDED FOR ANXIETY 60 tablet 0   amLODipine (NORVASC) 10 MG tablet TAKE 1 TABLET(10 MG) BY MOUTH DAILY (Patient taking differently: Take 10 mg by mouth daily.) 90 tablet 1   cetirizine (ZYRTEC) 10 MG tablet Take 10 mg by mouth daily.     cholecalciferol (VITAMIN D3) 25 MCG (1000 UT) tablet Take 1,000 Units by mouth 2 (two) times daily.     gabapentin (NEURONTIN) 100 MG capsule Take 2 capsules (200 mg total) by mouth 2 (two) times daily. 240 capsule 0   methocarbamol (ROBAXIN) 500 MG tablet Take 500 mg by mouth 2 (two) times daily.     pravastatin (PRAVACHOL) 40 MG tablet Take 1 tablet (40 mg total) by mouth daily. 90 tablet 2   thiamine (VITAMIN B1) 100 MG tablet Take 1 tablet (100 mg total) by mouth daily. 90 tablet 0   No current facility-administered medications for this visit.    PAST MEDICAL HISTORY: Past Medical History:  Diagnosis Date   ALLERGIC RHINITIS    ANEMIA, PERNICIOUS    ANXIETY    BILIARY DYSKINESIA    CARPAL TUNNEL SYNDROME, LEFT    DYSLIPIDEMIA    Headache(784.0)    HYPERTENSION    Irritable bowel syndrome    Non-celiac gluten sensitivity    OSTEOPENIA    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[place])    borderline    PAST SURGICAL HISTORY: Past Surgical History:  Procedure Laterality Date   APPENDECTOMY     CARPAL TUNNEL RELEASE     CESAREAN SECTION  76 & 85   x's 2    CHOLECYSTECTOMY  2005   Dr. Dalbert Batman (Gallbladder dyskinesia)   COLONOSCOPY     DILATION AND CURETTAGE OF UTERUS     LEFT HEART CATHETERIZATION WITH CORONARY ANGIOGRAM N/A 05/19/2012   Procedure: LEFT HEART CATHETERIZATION WITH CORONARY ANGIOGRAM;  Surgeon: Hillary Bow, MD;  Location: [facility] CATH LAB;  Service: Cardiovascular;   Laterality: N/A;   ROTATOR CUFF REPAIR     TONSILLECTOMY     UPPER GASTROINTESTINAL ENDOSCOPY      FAMILY HISTORY: Family History  Problem Relation Age of Onset   Colitis Mother    Heart disease Mother    Hyperlipidemia Mother    Lung cancer Mother        Cause of death   Hypertension Mother    Lung cancer Father    Heart disease Brother    Heart attack Brother    Heart disease Brother    Valvular heart disease Brother    Heart attack Brother    Atrial fibrillation Brother    Heart failure Brother    Miscarriages / Stillbirths Brother    Valvular heart disease Brother    Leukemia Brother    Coronary artery disease Brother        MI in late 33's. Still living   Coronary artery disease Brother  MI in 30's. Also still living.   Ovarian cancer Maternal Aunt    Diabetes Maternal Aunt    Colon cancer Neg Hx    Colon polyps Neg Hx     SOCIAL HISTORY: Social History   Socioeconomic History   Marital status: Married    Spouse name: Not on file   Number of children: Not on file   Years of education: Not on file   Highest education level: Not on file  Occupational History   Occupation: Dental office/ Retired    Fish farm manager: [employer]  Tobacco Use   Smoking status: Never   Smokeless tobacco: Never  Vaping Use   Vaping Use: Never used  Substance and Sexual Activity   Alcohol use: Yes    Alcohol/week: 1.0 standard drink of alcohol    Types: 1 Glasses of wine per week    Comment: wine nightly, none since July   Drug use: No   Sexual activity: Yes    Birth control/protection: Post-menopausal  Other Topics Concern   Not on file  Social History Narrative   Married and lives with husband.    Dental hygienist - retired   Scientist, physiological Strain: [place]  (11/24/2021)   Overall Financial Resource Strain (CARDIA)    Difficulty of Paying Living Expenses: Not hard at all  Food Insecurity: No Food Insecurity (11/24/2021)    Hunger Vital Sign    Worried About Running Out of Food in the Last Year: Never true    [place] in the Last Year: Never true  Transportation Needs: No Transportation Needs (11/24/2021)   PRAPARE - Hydrologist (Medical): No    Lack of Transportation (Non-Medical): No  Physical Activity: Sufficiently Active (11/24/2021)   Exercise Vital Sign    Days of Exercise per Week: 6 days    Minutes of Exercise per Session: 60 min  Stress: No Stress Concern Present (11/24/2021)   [place]    Feeling of Stress : Not at all  Social Connections: Moderately Isolated (11/24/2021)   Social Connection and Isolation Panel [NHANES]    Frequency of Communication with Friends and Family: Three times a week    Frequency of Social Gatherings with Friends and Family: Three times a week    Attends Religious Services: Never    Active Member of Clubs or Organizations: No    Attends Archivist Meetings: Never    Marital Status: Married  Human resources officer Violence: Not At Risk (11/24/2021)   Humiliation, Afraid, Rape, and Kick questionnaire    Fear of Current or Ex-Partner: No    Emotionally Abused: No    Physically Abused: No    Sexually Abused: No      Marcial Pacas, M.D. Ph.D.  [facility] [address] [place], [place] [address] Ph: [phone] Fax: [phone]  CC:  [name], MD [place],  [place] [address]  [name], MD

## 2022-05-16 LAB — ACETYLCHOLINE RECEPTOR, BINDING: A CHR BINDING ABS: <0.3 nmol/L

## 2022-05-16 LAB — STRIATED MUSCLE ANTIBODY: STRIATED MUSCLE AB SCREEN: NEGATIVE

## 2022-05-17 ENCOUNTER — Encounter: Payer: Self-pay | Admitting: Internal Medicine

## 2022-05-18 ENCOUNTER — Ambulatory Visit (HOSPITAL_BASED_OUTPATIENT_CLINIC_OR_DEPARTMENT_OTHER)
Admission: RE | Admit: 2022-05-18 | Discharge: 2022-05-18 | Disposition: A | Discharge status: Home / Self Care | Payer: Medicare Other | Source: Ambulatory Visit | Attending: Internal Medicine | Admitting: Internal Medicine

## 2022-05-18 DIAGNOSIS — Z136 Encounter for screening for cardiovascular disorders: Secondary | ICD-10-CM | POA: Insufficient documentation

## 2022-05-18 DIAGNOSIS — I119 Hypertensive heart disease without heart failure: Secondary | ICD-10-CM | POA: Insufficient documentation

## 2022-05-18 DIAGNOSIS — I10 Essential (primary) hypertension: Secondary | ICD-10-CM | POA: Insufficient documentation

## 2022-05-20 ENCOUNTER — Encounter: Payer: Self-pay | Admitting: Internal Medicine

## 2022-05-20 ENCOUNTER — Ambulatory Visit (INDEPENDENT_AMBULATORY_CARE_PROVIDER_SITE_OTHER): Payer: Medicare Other | Admitting: Internal Medicine

## 2022-05-20 VITALS — BP 142/82 | HR 79 | Temp 98.1°F | Ht 59.5 in | Wt 102.8 lb

## 2022-05-20 DIAGNOSIS — D473 Essential (hemorrhagic) thrombocythemia: Secondary | ICD-10-CM | POA: Diagnosis not present

## 2022-05-20 DIAGNOSIS — E519 Thiamine deficiency, unspecified: Secondary | ICD-10-CM | POA: Diagnosis not present

## 2022-05-20 DIAGNOSIS — M791 Myalgia, unspecified site: Secondary | ICD-10-CM | POA: Diagnosis not present

## 2022-05-20 DIAGNOSIS — M2550 Pain in unspecified joint: Secondary | ICD-10-CM | POA: Diagnosis not present

## 2022-05-20 DIAGNOSIS — I10 Essential (primary) hypertension: Secondary | ICD-10-CM

## 2022-05-20 DIAGNOSIS — R5383 Other fatigue: Secondary | ICD-10-CM

## 2022-05-20 LAB — CBC WITH DIFFERENTIAL/PLATELET
Basophils Absolute: 0 10*3/uL (ref 0.0–0.1)
Basophils Relative: 0.4 % (ref 0.0–3.0)
Eosinophils Absolute: 0.1 10*3/uL (ref 0.0–0.7)
Eosinophils Relative: 0.6 % (ref 0.0–5.0)
HCT: 33.3 % — ABNORMAL LOW (ref 36.0–46.0)
Hemoglobin: 11.1 g/dL — ABNORMAL LOW (ref 12.0–15.0)
Lymphocytes Relative: 16.2 % (ref 12.0–46.0)
Lymphs Abs: 1.4 10*3/uL (ref 0.7–4.0)
MCHC: 33.4 g/dL (ref 30.0–36.0)
MCV: 88.4 fl (ref 78.0–100.0)
Monocytes Absolute: 0.6 10*3/uL (ref 0.1–1.0)
Monocytes Relative: 7.5 % (ref 3.0–12.0)
Neutro Abs: 6.5 10*3/uL (ref 1.4–7.7)
Neutrophils Relative %: 75.3 % (ref 43.0–77.0)
Platelets: 426 10*3/uL — ABNORMAL HIGH (ref 150.0–400.0)
RBC: 3.77 Mil/uL — ABNORMAL LOW (ref 3.87–5.11)
RDW: 13.9 % (ref 11.5–15.5)
WBC: 8.6 10*3/uL (ref 4.0–10.5)

## 2022-05-20 LAB — COMPREHENSIVE METABOLIC PANEL
ALT: 9 U/L (ref 0–35)
AST: 15 U/L (ref 0–37)
Albumin: 4.1 g/dL (ref 3.5–5.2)
Alkaline Phosphatase: 127 U/L — ABNORMAL HIGH (ref 39–117)
BUN: 6 mg/dL (ref 6–23)
CO2: 26 mEq/L (ref 19–32)
Calcium: 10.1 mg/dL (ref 8.4–10.5)
Chloride: 98 mEq/L (ref 96–112)
Creatinine, Ser: 0.59 mg/dL (ref 0.40–1.20)
GFR: 92.14 mL/min (ref >60.00)
Glucose, Bld: 98 mg/dL (ref 70–99)
Potassium: 4.2 mEq/L (ref 3.5–5.1)
Sodium: 135 mEq/L (ref 135–145)
Total Bilirubin: 0.4 mg/dL (ref 0.2–1.2)
Total Protein: 7.8 g/dL (ref 6.0–8.3)

## 2022-05-20 LAB — SEDIMENTATION RATE: Sed Rate: 87 mm/hr — ABNORMAL HIGH (ref 0–30)

## 2022-05-20 LAB — C-REACTIVE PROTEIN: CRP: 3.3 mg/dL (ref 0.5–20.0)

## 2022-05-20 NOTE — Assessment & Plan Note (Signed)
Subacute Symptoms started the end of July She is experiencing decreased appetite, fatigue, weight loss and body aches To some degree is joint pain and to some degree it sounds more muscular in nature After today's visit it is more convincing that she has polymyalgia rheumatica less likely reactive arthritis Sees Dr. Rice 10/31-I think she is on a cancellation list, but ideally would be good if she could be seen sooner-we will reach out to Dr. Rice Will repeat ESR which was only mildly elevated the first time I saw her, ANA, CRP, CBC and CMP She is hesitant to go on steroids, but discussed that is the treatment and would help her symptoms-we will wait for the blood work to come back and I will call her tomorrow to discuss 

## 2022-05-20 NOTE — Assessment & Plan Note (Signed)
Chronic Blood pressure slightly elevated today, but she is in pain and there has been a lot going on so would like to hold off for now before changing any medication Continue amlodipine 10 mg daily She does have close follow-up so that we can reevaluate

## 2022-05-20 NOTE — Progress Notes (Signed)
Subjective:    Patient ID: [name], female    DOB: 1950-09-23, 71 y.o.   MRN: [id number]     HPI [name] is here for follow up of her joint pain and body pain   7/20 - UTI - rx'd cipro 9 cx showed pseudomonas and staphylococcus equorum  7/27 - [facility] for Back pain. She did have some abdominal pain, nausea and constipation - went to [facility].  Ct scan showed gastroenteritis.    7/31 - back pain x 5-6 days ( between both shoulder blades and down her back), knee pain since the day before.  Knees felt swollen, inflamed - could not squat.  No other joint pain.   She had an occ twinge but no pain at that visit.   The prednisone helped a lot.  Muscle relaxers and gabapentin have helped to some degree.  She can go up stairs one at a time.  Her right foot was dragging at first when she was feeling really bad.  Sleep is not good because of body pain.    Stiffness in ankles, knees hurt L > R, back hurts. Muscle aches across upper back.  Hips hurt when laying on them sometimes.  She thinks her knees have swelled - not as much recently.  Sometimes her muscles ache.    No pain in hands, wrist, elbows shoulders.  Left posterior knee swelling.  It feels like there is a lump there.  Her left foot is slightly swollen.  Has had difficulty getting up out of a chair.  Some difficulty doing hair -  recently her hair has felt too long to handle.    Has morning stiffness for a couple of hours.  It hurts when she first moves and then pain gets better - not walking as fast.    Recalls blurry vision before all these symptoms started.  She may still have some blurry vision but she has not paid much attention to it.    Sees Dr Benjamine Mola 10/31.  Did not take thiamine since yesterday morning.  She was wondering if her recent blood test with the neurologist was falsely elevated because she had taken the thiamine that morning.   Reviewed CT coronary calcium score test  Medications and allergies reviewed with  patient and updated if appropriate.  Current Outpatient Medications on File Prior to Visit  Medication Sig Dispense Refill   ALPRAZolam (XANAX) 0.25 MG tablet TAKE 1 TABLET(0.25 MG) BY MOUTH TWICE DAILY AS NEEDED FOR ANXIETY 60 tablet 0   amLODipine (NORVASC) 10 MG tablet TAKE 1 TABLET(10 MG) BY MOUTH DAILY (Patient taking differently: Take 10 mg by mouth daily.) 90 tablet 1   cetirizine (ZYRTEC) 10 MG tablet Take 10 mg by mouth daily.     cholecalciferol (VITAMIN D3) 25 MCG (1000 UT) tablet Take 1,000 Units by mouth 2 (two) times daily.     gabapentin (NEURONTIN) 100 MG capsule Take 2 capsules (200 mg total) by mouth 2 (two) times daily. 240 capsule 0   methocarbamol (ROBAXIN) 500 MG tablet Take 500 mg by mouth 2 (two) times daily.     pravastatin (PRAVACHOL) 40 MG tablet Take 1 tablet (40 mg total) by mouth daily. 90 tablet 2   thiamine (VITAMIN B1) 100 MG tablet Take 1 tablet (100 mg total) by mouth daily. 90 tablet 0   No current facility-administered medications on file prior to visit.     Review of Systems  Constitutional:  Positive for appetite  change (decreased), chills (occ) and fatigue.  Respiratory:  Positive for shortness of breath (gets winded with activity). Negative for cough and wheezing.   Cardiovascular:  Positive for palpitations (occ) and leg swelling (left foot). Negative for chest pain.  Gastrointestinal:  Positive for abdominal pain (last few nights cramping across lower abdomen - transient) and constipation (mild). Negative for diarrhea.  Musculoskeletal:  Positive for arthralgias, back pain, joint swelling and myalgias.  Skin:  Negative for rash.  Neurological:  Positive for weakness (legs are weak, arms are weak) and headaches (sometimes - not more than usual). Negative for dizziness, light-headedness and numbness.       Objective:   Vitals:   05/20/22 1347  BP: (!) 142/82  Pulse: 79  Temp: 98.1 F (36.7 C)  SpO2: 97%   BP Readings from Last 3  Encounters:  05/20/22 (!) 142/82  05/14/22 139/74  04/22/22 (!) 144/68   Wt Readings from Last 3 Encounters:  05/20/22 102 lb 12.8 oz (46.6 kg)  05/14/22 102 lb 6 oz (46.4 kg)  04/22/22 108 lb 3.2 oz (49.1 kg)   Body mass index is 20.42 kg/m.    Physical Exam Constitutional:      Appearance: Normal appearance.  HENT:     Head: Normocephalic and atraumatic.  Eyes:     Conjunctiva/sclera: Conjunctivae normal.  Cardiovascular:     Rate and Rhythm: Normal rate and regular rhythm.  Pulmonary:     Effort: Pulmonary effort is normal. No respiratory distress.     Breath sounds: No wheezing or rales.  Abdominal:     General: There is no distension.     Palpations: Abdomen is soft.     Tenderness: There is no abdominal tenderness.  Musculoskeletal:        General: No tenderness (No tenderness to palpation of knees, ankles or leg/arm muscles.) or deformity.     Right lower leg: No edema.     Left lower leg: Edema (1+ pitting dorsal foot swelling, no ankle swelling) present.     Comments: Palpable lump behind left knee-possible Baker's cyst  Skin:    General: Skin is warm and dry.     Findings: No rash.  Neurological:     Mental Status: She is alert.     Sensory: No sensory deficit.  Psychiatric:        Mood and Affect: Mood normal.        Lab Results  Component Value Date   WBC 7.3 05/12/2022   HGB 10.3 (L) 05/12/2022   HCT 30.5 (L) 05/12/2022   PLT 536.0 (H) 05/12/2022   GLUCOSE 91 05/12/2022   CHOL 200 12/26/2021   TRIG 70.0 12/26/2021   HDL 94.10 12/26/2021   LDLDIRECT 87.0 09/16/2012   LDLCALC 92 12/26/2021   ALT 15 04/06/2022   AST 18 04/06/2022   NA 135 05/12/2022   K 3.6 05/12/2022   CL 96 05/12/2022   CREATININE 0.47 05/12/2022   BUN 4 (L) 05/12/2022   CO2 27 05/12/2022   TSH 1.10 04/14/2022   INR 1.09 05/19/2012   HGBA1C 5.1 06/26/2021     Assessment & Plan:    See Problem List for Assessment and Plan of chronic medical problems.   I spent  30 minutes dedicated to the care of this patient on the date of this encounter including review of recent labs, imaging and procedures, speciality notes, obtaining history, communicating with the patient, ordering medications, tests, and documenting clinical information in the EHR

## 2022-05-20 NOTE — Assessment & Plan Note (Signed)
Blood work changes likely reactive Has seen hematology and they agree Repeat CBC today

## 2022-05-20 NOTE — Assessment & Plan Note (Signed)
Subacute Symptoms started the end of July She is experiencing decreased appetite, fatigue, weight loss and body aches To some degree is joint pain and to some degree it sounds more muscular in nature After today's visit it is more convincing that she has polymyalgia rheumatica less likely reactive arthritis Sees Dr. Benjamine Mola 10/31-I think she is on a cancellation list, but ideally would be good if she could be seen sooner-we will reach out to Dr. Benjamine Mola Will repeat ESR which was only mildly elevated the first time I saw her, ANA, CRP, CBC and CMP She is hesitant to go on steroids, but discussed that is the treatment and would help her symptoms-we will wait for the blood work to come back and I will call her tomorrow to discuss

## 2022-05-20 NOTE — Assessment & Plan Note (Signed)
Has been taking thiamine daily Level was initially very low Had repeat level with neurology, but she did take the thiamine that morning and wonders that if that influence the result She has not taken thiamine since yesterday morning Check thiamine level

## 2022-05-20 NOTE — Patient Instructions (Addendum)
     I will see if we can  get you in with Dr Benjamine Mola sooner.     Blood work was ordered.      Medications changes include :  none - lets consider prednisone - we can discuss tomorrow.

## 2022-05-21 ENCOUNTER — Encounter: Payer: Self-pay | Admitting: Internal Medicine

## 2022-05-21 ENCOUNTER — Ambulatory Visit
Admission: RE | Admit: 2022-05-21 | Discharge: 2022-05-21 | Disposition: A | Discharge status: Home / Self Care | Payer: Medicare Other | Source: Ambulatory Visit | Attending: Neurology | Admitting: Neurology

## 2022-05-21 ENCOUNTER — Telehealth: Payer: Self-pay | Admitting: Internal Medicine

## 2022-05-21 DIAGNOSIS — R269 Unspecified abnormalities of gait and mobility: Secondary | ICD-10-CM

## 2022-05-21 DIAGNOSIS — M353 Polymyalgia rheumatica: Secondary | ICD-10-CM

## 2022-05-21 LAB — VITAMIN B1: Thiamine: 149.4 nmol/L (ref 66.5–200.0)

## 2022-05-21 LAB — COPPER, SERUM: Copper: 175 ug/dL — ABNORMAL HIGH (ref 80–158)

## 2022-05-21 MED ORDER — PREDNISONE 20 MG PO TABS: 20.0000 mg | ORAL_TABLET | Freq: Every day | ORAL | 1 refills | Status: DC

## 2022-05-21 NOTE — Telephone Encounter (Signed)
Called [name] and reviewed results  Likely PMR  Start prednisone 20 mg daily x 2- 4 weeks  She will update me in 2 weeks about how she is doing

## 2022-05-21 NOTE — Assessment & Plan Note (Signed)
Started on prednisone 05/21/22 - 20 mg daily

## 2022-05-24 ENCOUNTER — Encounter: Payer: Self-pay | Admitting: Internal Medicine

## 2022-05-24 LAB — ANA: Anti Nuclear Antibody (ANA): NEGATIVE

## 2022-05-24 LAB — VITAMIN B1: Vitamin B1 (Thiamine): 45 nmol/L — ABNORMAL HIGH (ref 8–30)

## 2022-05-27 ENCOUNTER — Other Ambulatory Visit: Payer: Self-pay | Admitting: Internal Medicine

## 2022-06-04 ENCOUNTER — Encounter: Payer: Self-pay | Admitting: Internal Medicine

## 2022-06-05 ENCOUNTER — Encounter: Payer: Self-pay | Admitting: Internal Medicine

## 2022-06-05 ENCOUNTER — Ambulatory Visit: Payer: Medicare Other | Attending: Internal Medicine | Admitting: Internal Medicine

## 2022-06-05 VITALS — BP 171/66 | HR 64 | Resp 15 | Ht <= 58 in | Wt 104.0 lb

## 2022-06-05 DIAGNOSIS — M353 Polymyalgia rheumatica: Secondary | ICD-10-CM | POA: Insufficient documentation

## 2022-06-05 DIAGNOSIS — M02361 Reiter's disease, right knee: Secondary | ICD-10-CM | POA: Diagnosis not present

## 2022-06-05 DIAGNOSIS — G6182 Multifocal motor neuropathy: Secondary | ICD-10-CM | POA: Insufficient documentation

## 2022-06-05 DIAGNOSIS — M545 Low back pain, unspecified: Secondary | ICD-10-CM | POA: Insufficient documentation

## 2022-06-05 DIAGNOSIS — M810 Age-related osteoporosis without current pathological fracture: Secondary | ICD-10-CM | POA: Diagnosis not present

## 2022-06-05 DIAGNOSIS — E519 Thiamine deficiency, unspecified: Secondary | ICD-10-CM | POA: Diagnosis not present

## 2022-06-05 DIAGNOSIS — M7122 Synovial cyst of popliteal space [Baker], left knee: Secondary | ICD-10-CM | POA: Diagnosis not present

## 2022-06-05 DIAGNOSIS — D802 Selective deficiency of immunoglobulin A [IgA]: Secondary | ICD-10-CM | POA: Diagnosis not present

## 2022-06-05 DIAGNOSIS — G8929 Other chronic pain: Secondary | ICD-10-CM | POA: Diagnosis not present

## 2022-06-05 DIAGNOSIS — M45AB Non-radiographic axial spondyloarthritis of multiple sites in spine: Secondary | ICD-10-CM | POA: Diagnosis not present

## 2022-06-05 NOTE — Progress Notes (Signed)
Office Visit Note  Patient: [name]             Date of Birth: 1951-04-05           MRN: [id number]             PCP: [name], MD Referring: [name], MD Visit Date: 06/05/2022   Subjective:  New Patient (Initial Visit) (Abnormal labs)   History of Present Illness: [name] is a 71 y.o. female here for evaluation of lower extremity joint pain and swelling, weakness, and elevated inflammatory markers. Symptoms started around the end of July this year. Preceding onset of back and knee pains she was treated with ciprofloxacin for UTI with pseudomonas and staph equorum on culture. A week later [facility] visit with CT scan of the abdomen demonstrated probable gastroenteritis, at the time with constipation and abdominal pain. Never reported any blood diarrhea and no specific infection identified thought likely viral. She developed knee pain and decreased mobility several days later. Initial treatments with gabapentin, flexeril, and prednisone were helpful. Right knee xray showed mild osteoarthritis and trace effusion otherwise unremarkable. She was found to have elevated sedimentation rate, mild anemia with increased platelets, mild hypogammaglobulinemia of IgA, and low thiamine. She was also started on supplementation for this and saw Dr. Krista Blue with neurology. Neurologic exam was intact, MRI of cervical spine obtained showing multilevel degenerative changes without significant impingement in evidence. Persistent gait difficulty and was started on prednisone 20 mg daily with PCP office for concern of possible PMR and some symptom improvement. Nodule or swelling in the posterior left knee and in left heel also seen with PCP f/u.  Today she reports feeling pretty well still has some symptoms at the left knee but mostly mild bigger complaint is the posterior nodule or swelling. She is not experiencing any visual changes or eye pain.  No noticeable lymphadenopathy.  Urinary  symptoms have remained improved after treatment of the UTI.  She was not tolerating good oral intake during July and August with all of the symptoms going on but this is now improved.  Labs reviewed 05/2022 ANA neg ESR 87 CRP 3.3 BMP wnl Vit B1 45  04/2022 CK 17 Ferritin 350 Iron 5 TIBC 211 Sat 2% SPEP- acute phase reaction proteins BMP Na 128 K 3.2 Cl 86 [place] WB neg  03/2022 ANA neg CCP neg RF neg ESR 37 CRP 11.4 BMP Na 128 K 3.0 Cl 85   Activities of Daily Living:  Patient reports morning stiffness for 3 minutes.   Patient Denies nocturnal pain.  Difficulty dressing/grooming: Denies Difficulty climbing stairs: Denies Difficulty getting out of chair: Denies Difficulty using hands for taps, buttons, cutlery, and/or writing: Denies  Review of Systems  Constitutional:  Positive for fatigue.  HENT:  Negative for mouth sores and mouth dryness.   Eyes:  Negative for dryness.  Respiratory:  Positive for shortness of breath.   Cardiovascular:  Positive for palpitations. Negative for chest pain.  Gastrointestinal:  Negative for blood in stool, constipation and diarrhea.  Endocrine: Negative for increased urination.  Genitourinary:  Negative for involuntary urination.  Musculoskeletal:  Positive for joint pain, gait problem, joint pain, myalgias, muscle weakness, muscle tenderness and myalgias. Negative for joint swelling and morning stiffness.  Skin:  Negative for color change, rash, hair loss and sensitivity to sunlight.  Allergic/Immunologic: Negative for susceptible to infections.  Neurological:  Positive for dizziness and headaches.  Hematological:  Negative for  swollen glands.  Psychiatric/Behavioral:  Positive for sleep disturbance. Negative for depressed mood. The patient is nervous/anxious.     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[place]) 04/14/2022   Reactive arthritis of right knee (HCC) 04/14/2022   Essential thrombocytosis ([place]) 04/14/2022   Aortic atherosclerosis ([place]) 04/06/2022   Myalgia 04/06/2022   Fatigue 04/06/2022   Arthralgia of both knees 04/06/2022   Screening for heart disease 03/26/2022   Salivary gland swelling 12/02/2020   Fall 02/13/2020   Left arm pain 02/13/2020   Abdominal pain 12/07/2019   Hyperglycemia 06/07/2019   DDD (degenerative disc disease), cervical 02/02/2019   Musculoskeletal neck pain 10/14/2018   Change in bowel function 08/11/2018   Lower abdominal pain 08/11/2018   Diverticulitis 08/11/2018   Shoulder pain, left 02/23/2017   B12 deficiency 08/07/2016   Recurrent UTI 11/08/2015   Non-celiac gluten sensitivity    Anxiety state 01/17/2010   ALLERGIC RHINITIS 01/17/2010   Dyslipidemia 07/12/2009   Essential hypertension 07/12/2009   Osteoporosis 06/22/2008   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[place]) 10/14/2007   ANEMIA, PERNICIOUS 10/14/2007   IRRITABLE BOWEL SYNDROME 10/14/2007    Past Medical History:  Diagnosis Date   ALLERGIC RHINITIS    ANEMIA, PERNICIOUS    ANXIETY    BILIARY DYSKINESIA    CARPAL TUNNEL SYNDROME, LEFT    DYSLIPIDEMIA    Headache(784.0)    HYPERTENSION    Irritable bowel syndrome    Non-celiac gluten sensitivity    OSTEOPENIA    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HCC)    borderline    Family History  Problem Relation Age of Onset   Colitis Mother    Heart disease Mother    Hyperlipidemia Mother    Lung cancer Mother        Cause of death   Hypertension Mother    Lung cancer Father    Heart disease Brother    Heart attack Brother    Heart disease Brother    Valvular heart disease Brother    Heart attack Brother    Atrial fibrillation Brother    Heart failure  Brother    Miscarriages / Stillbirths Brother    Valvular heart disease Brother    Leukemia Brother    Coronary artery disease Brother        MI in late 51's. Still living   Coronary artery disease Brother        MI in 27's. Also still living.   Ovarian cancer Maternal Aunt    Diabetes Maternal Aunt    Colon cancer Neg Hx    Colon polyps Neg Hx    Past Surgical History:  Procedure Laterality Date   APPENDECTOMY     CARPAL TUNNEL RELEASE     CESAREAN SECTION  76 & 85   x's 2    CHOLECYSTECTOMY  2005   Dr. Dalbert Batman (Gallbladder dyskinesia)   COLONOSCOPY     DILATION AND CURETTAGE OF UTERUS     LEFT HEART CATHETERIZATION WITH CORONARY ANGIOGRAM N/A 05/19/2012   Procedure: LEFT HEART CATHETERIZATION WITH CORONARY ANGIOGRAM;  Surgeon: Hillary Bow, MD;  Location: [facility] CATH LAB;  Service: Cardiovascular;  Laterality: N/A;   ROTATOR CUFF REPAIR  TONSILLECTOMY     UPPER GASTROINTESTINAL ENDOSCOPY     Social History   Social History Narrative   Married and lives with husband.    Dental hygienist - retired   Product/process development scientist History  Administered Date(s) Administered   Fluad Quad(high Dose 65+) 06/08/2019, 06/27/2020   Influenza Split 06/17/2012   Influenza, High Dose Seasonal PF 06/06/2018   Influenza,inj,Quad PF,6+ Mos 07/10/2013, 08/16/2014, 08/07/2016, 08/11/2017   PFIZER Comirnaty(Gray Top)Covid-19 Tri-Sucrose Vaccine 12/14/2020   PFIZER(Purple Top)SARS-COV-2 Vaccination 10/02/2019, 10/23/2019, 06/19/2020, 12/13/2020   Pneumococcal Conjugate-13 06/08/2019   Pneumococcal Polysaccharide-23 06/27/2020   Td 09/08/2003   Tdap 11/08/2015   Zoster Recombinat (Shingrix) 06/18/2017, 10/06/2017     Objective: Vital Signs: BP (!) 171/66 (BP Location: Right Arm, Patient Position: Sitting, Cuff Size: Normal)   Pulse 64   Resp 15   Ht 4' 9.5" (1.461 m)   Wt 104 lb (47.2 kg)   BMI 22.12 kg/m    Physical Exam Eyes:     Conjunctiva/sclera: Conjunctivae normal.  Cardiovascular:      Rate and Rhythm: Normal rate and regular rhythm.  Pulmonary:     Effort: Pulmonary effort is normal.     Breath sounds: Normal breath sounds.  Musculoskeletal:     Right lower leg: No edema.     Left lower leg: No edema.  Skin:    General: Skin is warm and dry.     Findings: No rash.  Neurological:     Mental Status: She is alert.  Psychiatric:        Mood and Affect: Mood normal.      Musculoskeletal Exam:  Shoulders full ROM no tenderness or swelling Elbows full ROM no tenderness or swelling Wrists full ROM no tenderness or swelling Fingers full ROM no tenderness or swelling Paraspinal tenderness to pressure Hip normal internal and external rotation without pain, no tenderness to lateral hip palpation Knees full ROM, no tenderness, left knee trace effusion anteriorly, large baker's cyst in posterior of knee Ankles no tenderness or swelling, decreased eversion ROM in right ankle with hard end point  Investigation: No additional findings.  Imaging: MR CERVICAL SPINE WO CONTRAST  Result Date: 05/22/2022  [facility] [address] [place], [place] [address] [phone] NEUROIMAGING REPORT STUDY DATE: 05/21/2022 PATIENT NAME: MARYKAY MCCLEOD DOB: 10-29-1950 MRN: [id number] EXAM: MRI of the cervical spine ORDERING CLINICIAN: Marcial Pacas MD, PhD CLINICAL HISTORY: 71 year old woman with gait disturbance COMPARISON FILMS: None TECHNIQUE: MRI of the cervical spine was obtained utilizing 3 mm sagittal slices from the posterior fossa down to the T3-4 level with T1, T2 and inversion recovery views. In addition 4 mm axial slices from W2-3 down to T1-2 level were included with T2 and gradient echo views. CONTRAST: None IMAGING SITE: [facility], [place], [place], [place] FINDINGS: :  On sagittal images, the spine is imaged from above the cervicomedullary junction to T2.   The spinal cord is of normal caliber and signal.   There is 1 mm anterolisthesis of  C3 upon C4 and C7 upon T1..   There is a hemangioma within the T2 vertebral body and mild endplate degenerative changes at C5-C6 associated with reduced disc height.  Otherwise, vertebral bodies have normal signal.  The discs and interspaces were further evaluated on axial views from C2 to T1 as follows: C2-C3: There is left facet hypertrophy and mild ligamenta flava hypertrophy causing mild left foraminal narrowing but no spinal stenosis or nerve root compression. C3-C4: There  is minimal 1 mm anterolisthesis and minimal disc bulging and mild facet hypertrophy.  There is minimal bilateral foraminal narrowing but no spinal stenosis or nerve root compression. C4-C5: There or small disc osteophyte complexes and minimal left facet hypertrophy combining to cause mild bilateral foraminal narrowing but no spinal stenosis or nerve root compression. C5-C6: There is reduced disc height and minimal disc osteophyte complexes causing mild right foraminal narrowing.  The central canal is narrowed but not enough to be considered spinal stenosis.  There is minimal right foraminal narrowing but no nerve root compression. C6-C7: There is disc bulging.  No foraminal narrowing, spinal stenosis or nerve root compression. C7-T1: There is 1 mm anterolisthesis, minimal disc bulging and mild ligamenta flava hypertrophy but no foraminal narrowing, spinal stenosis or nerve root compression.  Incidental note is made of a right perineural cyst of doubtful significance. T1-T2: Not covered on the axial images but seen on the sagittal images, there is mild disc bulging causing mild foraminal narrowing bilaterally but no spinal stenosis or nerve root compression.   This MRI of the cervical spine shows the following: The spinal cord appears normal. Mild multilevel degenerative changes as detailed above do not lead to spinal stenosis or nerve root compression.  The INTERPRETING PHYSICIAN: Richard A. Felecia Shelling, MD, PhD, FAAN Certified in  Neuroimaging  by [employer]   [place]  Addendum Date: 05/19/2022   ADDENDUM REPORT: 05/19/2022 10:15 EXAM: OVER-READ INTERPRETATION  CT CHEST The following report is an over-read performed by radiologist Dr. Samara Snide [facility] on 05/19/2022. This over-read does not include interpretation of cardiac or coronary anatomy or pathology. The coronary calcium score interpretation by the cardiologist is attached. COMPARISON:  None. FINDINGS: Cardiovascular: Normal heart size. Small pericardial effusion. Atherosclerotic nonaneurysmal thoracic aorta. Normal caliber pulmonary arteries. Mediastinum/Nodes: Unremarkable esophagus. No pathologically enlarged mediastinal or hilar lymph nodes, noting limited sensitivity for the detection of hilar adenopathy on this noncontrast study. Lungs/Pleura: No pneumothorax. No pleural effusion. No acute consolidative airspace disease, lung masses or significant pulmonary nodules. Scattered thin curvilinear parenchymal bands at both lung bases. Upper abdomen: Cystic 4.1 cm upper splenic lesion with peripheral calcification, stable since at least 08/11/2018 CT abdomen study, compatible with benign etiology. Musculoskeletal: No aggressive appearing focal osseous lesions. Moderate thoracic spondylosis. IMPRESSION: 1. Small pericardial effusion. 2. Scattered thin curvilinear parenchymal bands at both lung bases, favor mild postinfectious/postinflammatory scarring. 3. Aortic Atherosclerosis (ICD10-I70.0). Electronically Signed   By: Ilona Sorrel M.D.   On: 05/19/2022 10:15   Result Date: 05/19/2022 CLINICAL DATA:  Cardiovascular disease risk stratification Screening heart disease, dyslipidemia, hypertension, family history EXAM: CT Coronary Calcium Score TECHNIQUE: A gated, non-contrast computed tomography scan of the heart was performed using 72mm slice thickness. Axial images were analyzed on a dedicated workstation. Calcium scoring of the  coronary arteries was performed using the Agatston method. FINDINGS: Coronary Calcium Score: Left main: 0 Left anterior descending artery: 5 Left circumflex artery: 0 Right coronary artery: 0 Total: 5 Percentile: 46th Pericardium: Normal.  Small pericardial effusion Ascending Aorta: Normal caliber. Ascending aorta measures approximately 82mm at the mid ascending aorta measured in an axial plane. Aortic atherosclerosis. Non-cardiac: See separate report from [facility]. IMPRESSION: Coronary calcium score of 5. This was 46th percentile for age-, race-, and sex-matched controls. RECOMMENDATIONS: Coronary artery calcium (CAC) score is a strong predictor of incident coronary heart disease (CHD) and provides predictive information beyond traditional risk factors. CAC scoring is reasonable to  use in the decision to withhold, postpone, or initiate statin therapy in intermediate-risk or selected borderline-risk asymptomatic adults (age 86-75 years and LDL-C >=70 to <190 mg/dL) who do not have diabetes or established atherosclerotic cardiovascular disease (ASCVD).* In intermediate-risk (10-year ASCVD risk >=7.5% to <20%) adults or selected borderline-risk (10-year ASCVD risk >=5% to <7.5%) adults in whom a CAC score is measured for the purpose of making a treatment decision the following recommendations have been made: If CAC=0, it is reasonable to withhold statin therapy and reassess in 5 to 10 years, as long as higher risk conditions are absent (diabetes mellitus, family history of premature CHD in first degree relatives (males <55 years; females <65 years), cigarette smoking, or LDL >=190 mg/dL). If CAC is 1 to 99, it is reasonable to initiate statin therapy for patients >=20 years of age. If CAC is >=100 or >=75th percentile, it is reasonable to initiate statin therapy at any age. Cardiology referral should be considered for patients with CAC scores >=400 or >=75th percentile. *2018  AHA/ACC/AACVPR/AAPA/ABC/ACPM/ADA/AGS/APhA/ASPC/NLA/PCNA Guideline on the Management of Blood Cholesterol: A Report of the American College of Cardiology/American Heart Association Task Force on Clinical Practice Guidelines. J Am Coll Cardiol. 2019;73(24):3168-3209. Electronically Signed: By: Cherlynn Kaiser M.D. On: 05/18/2022 23:58    Recent Labs: Lab Results  Component Value Date   WBC 8.6 05/20/2022   HGB 11.1 (L) 05/20/2022   PLT 426.0 (H) 05/20/2022   NA 135 05/20/2022   K 4.2 05/20/2022   CL 98 05/20/2022   CO2 26 05/20/2022   GLUCOSE 98 05/20/2022   BUN 6 05/20/2022   CREATININE 0.59 05/20/2022   BILITOT 0.4 05/20/2022   ALKPHOS 127 (H) 05/20/2022   AST 15 05/20/2022   ALT 9 05/20/2022   PROT 7.8 05/20/2022   ALBUMIN 4.1 05/20/2022   CALCIUM 10.1 05/20/2022   GFRAA >60 08/14/2018    Speciality Comments: No specialty comments available.  Procedures:  No procedures performed Allergies: Fosamax [alendronate sodium], Hydrocodone, Hydrocodone-acetaminophen, Hydrocodone-acetaminophen, Oxycodone-acetaminophen, Oxycodone-acetaminophen, and Tizanidine   Assessment / Plan:     Visit Diagnoses: Reactive arthritis of right knee (HCC)  PMR (polymyalgia rheumatica) ([place])- Plan: Sedimentation rate, C-reactive protein  Exam today and clinical history appears suggestive for reactive arthritis process.  I do not believe she has polymyalgia rheumatica considering a asymmetric oligoarticular distribution with worst symptoms in the knees rather than larger proximal joints.  Highly elevated sedimentation rate at her last blood test we will recheck this and CRP today expect these are reduced given her clinical response to the prednisone.  We will get these results then probably recommending a gradual prednisone tapering if she is not able to tolerate achieving a low-dose could consider acting longer-term DMARD options such as sulfasalazine.  Baker cyst, left  Definite left knee Baker's cyst  present at this time.  There is only trace effusion remaining in the suprapatellar bursa.  I suspect this is just left over from the previously more active inflammation that has improved on prednisone but has not been resorbed.  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HCC)  Low serum IgA can be associated with several systemic inflammatory responses.  We will check TTG screening for celiac disease although overall presentation is less typical for this.  Osteoporosis, unspecified osteoporosis type, unspecified pathological fracture presence  Known osteoporosis by previous bone densitometry.  She was apparently tried on Fosamax with GI intolerance currently just on calcium and vitamin D supplementation.  Especially if we are anticipating prolonged corticosteroid treatment could benefit from  revisiting antiresorptive therapy option may be subcutaneous or IV for better tolerance.  Multifocal motor neuropathy (HCC) Thiamine deficiency  Current motor exam is normal strength and reflexes throughout lower extremities.  MRI of the cervical spine did not show any significant appearing impingement.  May have had a previous problem that is improved with thiamine replacement.   Orders: Orders Placed This Encounter  Procedures   Sedimentation rate   C-reactive protein   Tissue transglutaminase, IgA   No orders of the defined types were placed in this encounter.    Follow-Up Instructions: Return in about 3 weeks (around 06/26/2022) for New pt ?ReA GC taper f/u 3-4wks.   Collier Salina, MD  Note - This record has been created using [employer].  Chart creation errors have been sought, but may not always  have been located. Such creation errors do not reflect on  the standard of medical care.

## 2022-06-06 LAB — TISSUE TRANSGLUTAMINASE, IGA: (tTG) Ab, IgA: <1 U/mL

## 2022-06-06 LAB — SEDIMENTATION RATE: Sed Rate: 6 mm/h (ref 0–30)

## 2022-06-06 LAB — C-REACTIVE PROTEIN: CRP: 0.5 mg/L (ref <8.0)

## 2022-06-08 MED ORDER — PREDNISONE 5 MG PO TABS: ORAL_TABLET | ORAL | 0 refills | Status: DC

## 2022-06-08 NOTE — Addendum Note (Signed)
Addended by: Collier Salina on: 06/08/2022 08:21 AM   Modules accepted: Orders

## 2022-06-08 NOTE — Progress Notes (Signed)
I spoke with [name] her lab results from clinic last week are negative for celiac disease her inflammatory numbers are also completely down to normal.  This is definitely effect of the 20 mg prednisone as it was very high 2 to 3 weeks ago.  Plan is to start tapering the prednisone will go down by 5 mg daily dose every 7 days starting at 15 mg tomorrow and would aim to be at a dose of 5 mg by our follow-up appointment later this month.  She mentioned concerned about unusual feeling in her mouth and seeing a white discoloration coating on the top of the tongue.  This could be consistent with thrush she plans to check with her dentist office for any recommendations.

## 2022-06-16 ENCOUNTER — Ambulatory Visit: Payer: Medicare Other | Admitting: Neurology

## 2022-06-18 NOTE — Progress Notes (Signed)
Office Visit Note  Patient: [name]             Date of Birth: 1951-04-14           MRN: [id number]             PCP: Pincus Sanes, MD Referring: Pincus Sanes, MD Visit Date: 06/29/2022   Subjective:  Follow-up (Right knee swelling, left knee less so with pain in both. Left foot plantar issue: "feels like I'm walking on a sponge." Lump and bruise in left lower back. )   History of Present Illness: [name] is a 71 y.o. female here for follow up for lower extremity pain and joint swelling on prednisone tapered after last visit form 20 mg daily down to 5 mg. She notices an increase in symptoms describes a sensation of pressure of squeezing on her low back and legs. Also developed a tender nodule in the left buttock and subsequent overlying bruising. Knee pains without erythema, left knee bakers cyst feels smaller. She still feels sensation on swelling or pillow sensation on sole of the foot.  Previous HPI 06/05/2022 [name] is a 71 y.o. female here for evaluation of lower extremity joint pain and swelling, weakness, and elevated inflammatory markers. Symptoms started around the end of July this year. Preceding onset of back and knee pains she was treated with ciprofloxacin for UTI with pseudomonas and staph equorum on culture. A week later [name] ED visit with CT scan of the abdomen demonstrated probable gastroenteritis, at the time with constipation and abdominal pain. Never reported any blood diarrhea and no specific infection identified thought likely viral. She developed knee pain and decreased mobility several days later. Initial treatments with gabapentin, flexeril, and prednisone were helpful. Right knee xray showed mild osteoarthritis and trace effusion otherwise unremarkable. She was found to have elevated sedimentation rate, mild anemia with increased platelets, mild hypogammaglobulinemia of IgA, and low thiamine. She was also started on supplementation for this  and saw Dr. Terrace Arabia with neurology. Neurologic exam was intact, MRI of cervical spine obtained showing multilevel degenerative changes without significant impingement in evidence. Persistent gait difficulty and was started on prednisone 20 mg daily with PCP office for concern of possible PMR and some symptom improvement. Nodule or swelling in the posterior left knee and in left heel also seen with PCP f/u.  Today she reports feeling pretty well still has some symptoms at the left knee but mostly mild bigger complaint is the posterior nodule or swelling. She is not experiencing any visual changes or eye pain.  No noticeable lymphadenopathy.  Urinary symptoms have remained improved after treatment of the UTI.  She was not tolerating good oral intake during July and August with all of the symptoms going on but this is now improved.   Labs reviewed 05/2022 ANA neg ESR 87 CRP 3.3 BMP wnl Vit B1 45   04/2022 CK 17 Ferritin 350 Iron 5 TIBC 211 Sat 2% SPEP- acute phase reaction proteins BMP Na 128 K 3.2 Cl 86 Lyme WB neg   03/2022 ANA neg CCP neg RF neg ESR 37 CRP 11.4 BMP Na 128 K 3.0 Cl 85     Activities of Daily Living:  Patient reports morning stiffness for 3 minutes.   Patient Denies nocturnal pain.  Difficulty dressing/grooming: Denies Difficulty climbing stairs: Denies Difficulty getting out of chair: Denies Difficulty using hands for taps, buttons, cutlery, and/or writing: Denies   Review of Systems  Constitutional:  Positive for fatigue.  HENT:  Positive for mouth dryness. Negative for mouth sores.   Eyes:  Negative for dryness.  Respiratory:  Positive for shortness of breath.   Cardiovascular:  Positive for palpitations. Negative for chest pain.  Gastrointestinal:  Negative for blood in stool, constipation and diarrhea.  Endocrine: Negative for increased urination.  Genitourinary:  Negative for involuntary urination.  Musculoskeletal:  Positive for joint pain, gait problem,  joint pain, joint swelling, myalgias, muscle weakness, morning stiffness, muscle tenderness and myalgias.  Skin:  Negative for color change, rash, hair loss and sensitivity to sunlight.  Allergic/Immunologic: Negative for susceptible to infections.  Neurological:  Positive for headaches. Negative for dizziness.  Hematological:  Positive for swollen glands.  Psychiatric/Behavioral:  Negative for depressed mood and sleep disturbance. The patient is not nervous/anxious.     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[place]) 04/14/2022   Reactive arthritis of right knee (HCC) 04/14/2022   Essential thrombocytosis ([place]) 04/14/2022   Aortic atherosclerosis ([place]) 04/06/2022   Myalgia 04/06/2022   Fatigue 04/06/2022   Arthralgia of both knees 04/06/2022   Screening for heart disease 03/26/2022   Salivary gland swelling 12/02/2020   Fall 02/13/2020   Left arm pain 02/13/2020   Abdominal pain 12/07/2019   Hyperglycemia 06/07/2019   DDD (degenerative disc disease), cervical 02/02/2019   Musculoskeletal neck pain 10/14/2018   Change in bowel function 08/11/2018   Lower abdominal pain 08/11/2018   Diverticulitis 08/11/2018   Shoulder pain, left 02/23/2017   B12 deficiency 08/07/2016   Recurrent UTI 11/08/2015   Non-celiac gluten sensitivity    Anxiety state 01/17/2010   ALLERGIC RHINITIS 01/17/2010   Dyslipidemia 07/12/2009   Essential hypertension 07/12/2009   Osteoporosis 06/22/2008   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[place]) 10/14/2007   ANEMIA, PERNICIOUS 10/14/2007   IRRITABLE BOWEL SYNDROME 10/14/2007    Past Medical  History:  Diagnosis Date   ALLERGIC RHINITIS    ANEMIA, PERNICIOUS    ANXIETY    BILIARY DYSKINESIA    CARPAL TUNNEL SYNDROME, LEFT    DYSLIPIDEMIA    Headache(784.0)    HYPERTENSION    Irritable bowel syndrome    Non-celiac gluten sensitivity    OSTEOPENIA    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HCC)    borderline    Family History  Problem Relation Age of Onset   Colitis Mother    Heart disease Mother    Hyperlipidemia Mother    Lung cancer Mother        Cause of death   Hypertension Mother    Lung cancer Father    Heart disease Brother    Heart attack Brother    Heart disease Brother    Valvular heart disease Brother    Heart attack Brother    Atrial fibrillation Brother    Heart failure Brother    Miscarriages / Stillbirths Brother    Valvular heart disease Brother    Leukemia Brother    Coronary artery disease Brother        MI in late 57's. Still living   Coronary artery disease Brother        MI in 7's.  Also still living.   Ovarian cancer Maternal Aunt    Diabetes Maternal Aunt    Colon cancer Neg Hx    Colon polyps Neg Hx    Past Surgical History:  Procedure Laterality Date   APPENDECTOMY     CARPAL TUNNEL RELEASE     CESAREAN SECTION  76 & 85   x's 2    CHOLECYSTECTOMY  2005   Dr. Dalbert Batman (Gallbladder dyskinesia)   COLONOSCOPY     DILATION AND CURETTAGE OF UTERUS     LEFT HEART CATHETERIZATION WITH CORONARY ANGIOGRAM N/A 05/19/2012   Procedure: LEFT HEART CATHETERIZATION WITH CORONARY ANGIOGRAM;  Surgeon: Hillary Bow, MD;  Location: [facility] CATH LAB;  Service: Cardiovascular;  Laterality: N/A;   ROTATOR CUFF REPAIR     TONSILLECTOMY     UPPER GASTROINTESTINAL ENDOSCOPY     Social History   Social History Narrative   Married and lives with husband.    Dental hygienist - retired   Product/process development scientist History  Administered Date(s) Administered   Fluad Quad(high Dose 65+) 06/08/2019, 06/27/2020, 07/03/2022   Influenza Split 06/17/2012   Influenza, High  Dose Seasonal PF 06/06/2018   Influenza,inj,Quad PF,6+ Mos 07/10/2013, 08/16/2014, 08/07/2016, 08/11/2017   PFIZER Comirnaty(Gray Top)Covid-19 Tri-Sucrose Vaccine 12/14/2020   PFIZER(Purple Top)SARS-COV-2 Vaccination 10/02/2019, 10/23/2019, 06/19/2020, 12/13/2020   Pneumococcal Conjugate-13 06/08/2019   Pneumococcal Polysaccharide-23 06/27/2020   Td 09/08/2003   Tdap 11/08/2015   Zoster Recombinat (Shingrix) 06/18/2017, 10/06/2017     Objective: Vital Signs: BP (!) 149/69 (BP Location: Right Arm, Patient Position: Sitting, Cuff Size: Normal)   Pulse 78   Resp 15   Ht 4' 9.5" (1.461 m)   Wt 103 lb 6.4 oz (46.9 kg)   BMI 21.99 kg/m    Physical Exam Cardiovascular:     Rate and Rhythm: Normal rate and regular rhythm.  Pulmonary:     Effort: Pulmonary effort is normal.     Breath sounds: Normal breath sounds.  Musculoskeletal:     Right lower leg: No edema.     Left lower leg: No edema.  Skin:    General: Skin is warm and dry.     Findings: No rash.  Neurological:     Mental Status: She is alert.  Psychiatric:        Mood and Affect: Mood normal.      Musculoskeletal Exam:  Shoulders full ROM no tenderness or swelling Elbows full ROM no tenderness or swelling Wrists full ROM no tenderness or swelling Fingers full ROM no tenderness or swelling No paraspinal tenderness to palpation over upper and lower back Hip normal internal and external rotation without pain, firm mildly tender nodule on left buttock posterior to trochanteric head in seated position, no lateral tenderness to pressure Knees full ROM no tenderness or swelling Ankles full ROM no tenderness or swelling MTPs full ROM no tenderness or swelling, bony enlargement of 1st MTP without tenderness   Investigation: No additional findings.  Imaging: No results found.  Recent Labs: Lab Results  Component Value Date   WBC 8.6 05/20/2022   HGB 11.1 (L) 05/20/2022   PLT 426.0 (H) 05/20/2022   NA 135  05/20/2022   K 4.2 05/20/2022   CL 98 05/20/2022   CO2 26 05/20/2022   GLUCOSE 98 05/20/2022   BUN 6 05/20/2022   CREATININE 0.59 05/20/2022   BILITOT 0.4 05/20/2022   ALKPHOS 127 (H) 05/20/2022   AST 15 05/20/2022   ALT 9 05/20/2022   PROT 7.8 05/20/2022  ALBUMIN 4.1 05/20/2022   CALCIUM 10.1 05/20/2022   GFRAA >60 08/14/2018    Speciality Comments: No specialty comments available.  Procedures:  No procedures performed Allergies: Fosamax [alendronate sodium], Hydrocodone, Hydrocodone-acetaminophen, Hydrocodone-acetaminophen, Oxycodone-acetaminophen, Oxycodone-acetaminophen, and Tizanidine   Assessment / Plan:     Visit Diagnoses: Reactive arthritis of right knee ([place]) - Plan: Sedimentation rate, C-reactive protein, predniSONE (DELTASONE) 5 MG tablet  She has tolerated the prednisone tapering reasonably well still is having active swelling at the knee and foot but on a much lower dose of prednisone is safer for longer continuation.  Reviewed that reactive arthritis process is commonly self-limited though in some cases can develop into a chronic inflammatory problem usually oligoarticular.  Rechecking sedimentation rate and CRP for disease monitoring today.  If these are continuing to stay improved on the low-dose prednisone would recommend trying to stay at this low dose for longer and if symptoms improve can further taper.  If inflammation has worsened would recommend considering steroid sparing medication sooner anticipating more long term symptoms.  PMR (polymyalgia rheumatica) (HCC)  I think overall clinical course has been less consistent with PMR then alternate diagnosis so we will discontinue this suspected process.  Baker cyst, left  Posterior swelling remains present remains more prominent than the joint effusion appreciated in suprapatellar bursa.   Orders: Orders Placed This Encounter  Procedures   Sedimentation rate   C-reactive protein   Meds ordered this  encounter  Medications   predniSONE (DELTASONE) 5 MG tablet    Sig: Take 1 tablet (5 mg total) by mouth daily with breakfast.    Dispense:  30 tablet    Refill:  2     Follow-Up Instructions: Return in about 3 months (around 09/29/2022) for ?ReA/?PMR on GC f/u 51mos.   Collier Salina, MD  Note - This record has been created using [employer].  Chart creation errors have been sought, but may not always  have been located. Such creation errors do not reflect on  the standard of medical care.

## 2022-06-19 ENCOUNTER — Encounter: Payer: Self-pay | Admitting: Internal Medicine

## 2022-06-29 ENCOUNTER — Encounter: Payer: Self-pay | Admitting: Internal Medicine

## 2022-06-29 ENCOUNTER — Ambulatory Visit: Payer: Medicare Other | Attending: Internal Medicine | Admitting: Internal Medicine

## 2022-06-29 VITALS — BP 149/69 | HR 78 | Resp 15 | Ht <= 58 in | Wt 103.4 lb

## 2022-06-29 DIAGNOSIS — M02361 Reiter's disease, right knee: Secondary | ICD-10-CM | POA: Diagnosis not present

## 2022-06-29 DIAGNOSIS — M353 Polymyalgia rheumatica: Secondary | ICD-10-CM | POA: Insufficient documentation

## 2022-06-29 DIAGNOSIS — M7122 Synovial cyst of popliteal space [Baker], left knee: Secondary | ICD-10-CM | POA: Diagnosis not present

## 2022-06-29 DIAGNOSIS — D802 Selective deficiency of immunoglobulin A [IgA]: Secondary | ICD-10-CM

## 2022-06-29 DIAGNOSIS — E519 Thiamine deficiency, unspecified: Secondary | ICD-10-CM

## 2022-06-29 DIAGNOSIS — G6182 Multifocal motor neuropathy: Secondary | ICD-10-CM

## 2022-06-29 DIAGNOSIS — M810 Age-related osteoporosis without current pathological fracture: Secondary | ICD-10-CM

## 2022-06-30 LAB — C-REACTIVE PROTEIN: CRP: 2.8 mg/L (ref <8.0)

## 2022-06-30 LAB — SEDIMENTATION RATE: Sed Rate: 6 mm/h (ref 0–30)

## 2022-07-02 NOTE — Patient Instructions (Addendum)
    Flu immunization administered today.      Medications changes include :   restart hydrochlorothiazide       Return in about 6 months (around 01/02/2023) for follow up.

## 2022-07-02 NOTE — Progress Notes (Signed)
Subjective:    Patient ID: [name], female    DOB: 1951/09/02, 71 y.o.   MRN: [id number]     HPI [name] is here for follow up of her chronic medical problems, including htn, hld, anxiety, OP, recurrent UTI  Saw Dr Benjamine Mola 10/23 - reactive arthritis, PMR  Still not feeling great - still hurts, has not been exercising.  Has not gained weight.   Medications and allergies reviewed with patient and updated if appropriate.  Current Outpatient Medications on File Prior to Visit  Medication Sig Dispense Refill   ALPRAZolam (XANAX) 0.25 MG tablet TAKE 1 TABLET(0.25 MG) BY MOUTH TWICE DAILY AS NEEDED FOR ANXIETY 60 tablet 0   cetirizine (ZYRTEC) 10 MG tablet Take 10 mg by mouth daily.     methocarbamol (ROBAXIN) 500 MG tablet TAKE 1 TABLET(500 MG) BY MOUTH TWICE DAILY AS NEEDED FOR MUSCLE SPASMS 90 tablet 0   pravastatin (PRAVACHOL) 40 MG tablet Take 1 tablet (40 mg total) by mouth daily. 90 tablet 2   predniSONE (DELTASONE) 5 MG tablet Take 1 tablet (5 mg total) by mouth daily with breakfast. 30 tablet 2   No current facility-administered medications on file prior to visit.     Review of Systems  Constitutional:  Negative for appetite change and fever.  Respiratory:  Positive for shortness of breath (a little - improved). Negative for cough and wheezing.   Cardiovascular:  Positive for palpitations. Negative for chest pain and leg swelling.  Neurological:  Positive for light-headedness (a little at times). Negative for headaches.       Objective:   Vitals:   07/03/22 1011  BP: (!) 144/80  Pulse: 74  Temp: 98.1 F (36.7 C)  SpO2: 98%   BP Readings from Last 3 Encounters:  07/03/22 (!) 144/80  06/29/22 (!) 149/69  06/05/22 (!) 171/66   Wt Readings from Last 3 Encounters:  07/03/22 103 lb 6.4 oz (46.9 kg)  06/29/22 103 lb 6.4 oz (46.9 kg)  06/05/22 104 lb (47.2 kg)   Body mass index is 21.99 kg/m.    Physical Exam Constitutional:      General: She is not  in acute distress.    Appearance: Normal appearance.  HENT:     Head: Normocephalic and atraumatic.  Eyes:     Conjunctiva/sclera: Conjunctivae normal.  Cardiovascular:     Rate and Rhythm: Normal rate and regular rhythm.     Heart sounds: Normal heart sounds. No murmur heard. Pulmonary:     Effort: Pulmonary effort is normal. No respiratory distress.     Breath sounds: Normal breath sounds. No wheezing.  Musculoskeletal:     Cervical back: Neck supple.     Right lower leg: No edema.     Left lower leg: No edema.  Lymphadenopathy:     Cervical: No cervical adenopathy.  Skin:    General: Skin is warm and dry.     Findings: No rash.  Neurological:     Mental Status: She is alert. Mental status is at baseline.  Psychiatric:        Mood and Affect: Mood normal.        Behavior: Behavior normal.        Lab Results  Component Value Date   WBC 8.6 05/20/2022   HGB 11.1 (L) 05/20/2022   HCT 33.3 (L) 05/20/2022   PLT 426.0 (H) 05/20/2022   GLUCOSE 98 05/20/2022   CHOL 200 12/26/2021   TRIG 70.0 12/26/2021  HDL 94.10 12/26/2021   LDLDIRECT 87.0 09/16/2012   LDLCALC 92 12/26/2021   ALT 9 05/20/2022   AST 15 05/20/2022   NA 135 05/20/2022   K 4.2 05/20/2022   CL 98 05/20/2022   CREATININE 0.59 05/20/2022   BUN 6 05/20/2022   CO2 26 05/20/2022   TSH 1.10 04/14/2022   INR 1.09 05/19/2012   HGBA1C 5.1 06/26/2021     Assessment & Plan:    See Problem List for Assessment and Plan of chronic medical problems.

## 2022-07-03 ENCOUNTER — Encounter: Payer: Self-pay | Admitting: Internal Medicine

## 2022-07-03 ENCOUNTER — Ambulatory Visit (INDEPENDENT_AMBULATORY_CARE_PROVIDER_SITE_OTHER): Payer: Medicare Other | Admitting: Internal Medicine

## 2022-07-03 VITALS — BP 144/80 | HR 74 | Temp 98.1°F | Ht <= 58 in | Wt 103.4 lb

## 2022-07-03 DIAGNOSIS — R002 Palpitations: Secondary | ICD-10-CM | POA: Diagnosis not present

## 2022-07-03 DIAGNOSIS — M810 Age-related osteoporosis without current pathological fracture: Secondary | ICD-10-CM | POA: Diagnosis not present

## 2022-07-03 DIAGNOSIS — N390 Urinary tract infection, site not specified: Secondary | ICD-10-CM | POA: Diagnosis not present

## 2022-07-03 DIAGNOSIS — F411 Generalized anxiety disorder: Secondary | ICD-10-CM

## 2022-07-03 DIAGNOSIS — Z23 Encounter for immunization: Secondary | ICD-10-CM | POA: Diagnosis not present

## 2022-07-03 DIAGNOSIS — I10 Essential (primary) hypertension: Secondary | ICD-10-CM | POA: Diagnosis not present

## 2022-07-03 MED ORDER — AMLODIPINE BESYLATE 10 MG PO TABS: 10.0000 mg | ORAL_TABLET | Freq: Every day | ORAL | 2 refills | Status: DC

## 2022-07-03 MED ORDER — HYDROCHLOROTHIAZIDE 12.5 MG PO TABS: 12.5000 mg | ORAL_TABLET | Freq: Every day | ORAL | 3 refills | Status: DC

## 2022-07-03 MED ORDER — PREDNISONE 5 MG PO TABS: 5.0000 mg | ORAL_TABLET | Freq: Every day | ORAL | 2 refills | Status: DC

## 2022-07-03 NOTE — Assessment & Plan Note (Addendum)
Chronic Recurrent UTI Currently not taking any medication for prevention - was on macrobid Monitor w/o medication for prevention

## 2022-07-03 NOTE — Assessment & Plan Note (Addendum)
Chronic Blood pressure not controlled Continue amlodipine 10 mg daily Restart hctz 12.5 mg daily

## 2022-07-03 NOTE — Progress Notes (Signed)
Sedimentation rate remains normal on the low dose prednisone. I recommend she can try staying at this low dose of medication for now rather than trying to start anything more long term. I will send a new prescription for the 5 mg prednisone tablets. If she feels like symptoms are doing very well after at least 1 month from now she can try cutting the dose in half again. When we follow up we can recheck her progress.

## 2022-07-03 NOTE — Assessment & Plan Note (Addendum)
Chronic Controlled, stable Continue xanax 0.25 mg bid prn-takes infrequently

## 2022-07-03 NOTE — Assessment & Plan Note (Signed)
Acute Experiencing some mild palpitations On exam here irregularly irregular pattern that sounds like a premature beat every third beat EKG today shows NSR at 70 bpm, negative precordial leads-likely normal variant, normal EKG.  No change compared to prior EKG from 2019 Will monitor palpitations-she thinks they have improved since decreasing dose of the prednisone which is likely the cause

## 2022-07-03 NOTE — Assessment & Plan Note (Addendum)
Chronic DEXA up-to-date and monitored by gynecologist Not currently on any medication Currently on low-dose prednisone-discussed that she may need to consider medication for her bones Will restart regular walking

## 2022-07-07 ENCOUNTER — Ambulatory Visit: Payer: Medicare Other | Admitting: Internal Medicine

## 2022-07-10 ENCOUNTER — Encounter: Payer: Self-pay | Admitting: Internal Medicine

## 2022-07-10 DIAGNOSIS — H3530 Unspecified macular degeneration: Secondary | ICD-10-CM | POA: Insufficient documentation

## 2022-07-20 DIAGNOSIS — Z23 Encounter for immunization: Secondary | ICD-10-CM | POA: Diagnosis not present

## 2022-07-23 ENCOUNTER — Encounter: Payer: Self-pay | Admitting: Internal Medicine

## 2022-08-17 ENCOUNTER — Ambulatory Visit: Payer: Medicare Other | Admitting: Neurology

## 2022-09-01 ENCOUNTER — Encounter: Payer: Self-pay | Admitting: Internal Medicine

## 2022-10-05 ENCOUNTER — Encounter: Payer: Self-pay | Admitting: Internal Medicine

## 2022-10-05 ENCOUNTER — Other Ambulatory Visit: Payer: Self-pay | Admitting: Internal Medicine

## 2022-10-05 ENCOUNTER — Ambulatory Visit: Payer: Medicare Other | Attending: Internal Medicine | Admitting: Internal Medicine

## 2022-10-05 VITALS — BP 132/85 | HR 80 | Ht 59.0 in | Wt 102.0 lb

## 2022-10-05 DIAGNOSIS — M7122 Synovial cyst of popliteal space [Baker], left knee: Secondary | ICD-10-CM | POA: Insufficient documentation

## 2022-10-05 DIAGNOSIS — M2550 Pain in unspecified joint: Secondary | ICD-10-CM | POA: Diagnosis not present

## 2022-10-05 DIAGNOSIS — Z79899 Other long term (current) drug therapy: Secondary | ICD-10-CM | POA: Diagnosis not present

## 2022-10-05 DIAGNOSIS — M02361 Reiter's disease, right knee: Secondary | ICD-10-CM | POA: Insufficient documentation

## 2022-10-05 MED ORDER — SULFASALAZINE 500 MG PO TABS: 1000.0000 mg | ORAL_TABLET | Freq: Two times a day (BID) | ORAL | 1 refills | Status: DC

## 2022-10-05 MED ORDER — PREDNISONE 5 MG PO TABS: ORAL_TABLET | ORAL | 0 refills | Status: AC

## 2022-10-05 NOTE — Progress Notes (Signed)
Office Visit Note  Patient: [name]             Date of Birth: June 16, 1951           MRN: [id number]             PCP: [name], MD Referring: [name], MD Visit Date: 10/05/2022   Subjective:  Follow-up (Body aches and pains x 6-7 weeks)   History of Present Illness: [name] is a 72 y.o. female here for follow up for inflammatory arthritis suspect reactive arthritis or seronegative arthritis. Since finishing the prednisone treatment her symptoms worsened after about 1 week later. Pretty much hurting diffusely, gets stiff all over and hard to get up and move sometimes.. Her knees have become swollen again with slightly reduced ROM.   Previous HPI 06/29/22 [name] is a 72 y.o. female here for follow up for lower extremity pain and joint swelling on prednisone tapered after last visit form 20 mg daily down to 5 mg. She notices an increase in symptoms describes a sensation of pressure of squeezing on her low back and legs. Also developed a tender nodule in the left buttock and subsequent overlying bruising. Knee pains without erythema, left knee bakers cyst feels smaller. She still feels sensation on swelling or pillow sensation on sole of the foot.   Previous HPI 06/05/2022 [name] is a 72 y.o. female here for evaluation of lower extremity joint pain and swelling, weakness, and elevated inflammatory markers. Symptoms started around the end of July this year. Preceding onset of back and knee pains she was treated with ciprofloxacin for UTI with pseudomonas and staph equorum on culture. A week later [facility] visit with CT scan of the abdomen demonstrated probable gastroenteritis, at the time with constipation and abdominal pain. Never reported any blood diarrhea and no specific infection identified thought likely viral. She developed knee pain and decreased mobility several days later. Initial treatments with gabapentin, flexeril, and prednisone were  helpful. Right knee xray showed mild osteoarthritis and trace effusion otherwise unremarkable. She was found to have elevated sedimentation rate, mild anemia with increased platelets, mild hypogammaglobulinemia of IgA, and low thiamine. She was also started on supplementation for this and saw Dr. Krista Blue with neurology. Neurologic exam was intact, MRI of cervical spine obtained showing multilevel degenerative changes without significant impingement in evidence. Persistent gait difficulty and was started on prednisone 20 mg daily with PCP office for concern of possible PMR and some symptom improvement. Nodule or swelling in the posterior left knee and in left heel also seen with PCP f/u.  Today she reports feeling pretty well still has some symptoms at the left knee but mostly mild bigger complaint is the posterior nodule or swelling. She is not experiencing any visual changes or eye pain.  No noticeable lymphadenopathy.  Urinary symptoms have remained improved after treatment of the UTI.  She was not tolerating good oral intake during July and August with all of the symptoms going on but this is now improved.   Labs reviewed 05/2022 ANA neg ESR 87 CRP 3.3 BMP wnl Vit B1 45   04/2022 CK 17 Ferritin 350 Iron 5 TIBC 211 Sat 2% SPEP- acute phase reaction proteins BMP Na 128 K 3.2 Cl 86 Lyme WB neg   03/2022 ANA neg CCP neg RF neg ESR 37 CRP 11.4 BMP Na 128 K 3.0 Cl 85   Review of Systems  Constitutional:  Positive  for fatigue.  HENT:  Negative for mouth sores and mouth dryness.   Eyes:  Negative for dryness.  Respiratory:  Negative for shortness of breath.   Cardiovascular:  Negative for chest pain and palpitations.  Gastrointestinal:  Negative for blood in stool, constipation and diarrhea.  Endocrine: Positive for increased urination.  Genitourinary:  Negative for involuntary urination.  Musculoskeletal:  Positive for joint pain, gait problem, joint pain, joint swelling, myalgias, muscle  weakness, muscle tenderness and myalgias. Negative for morning stiffness.  Skin:  Positive for hair loss. Negative for color change, rash and sensitivity to sunlight.  Allergic/Immunologic: Negative for susceptible to infections.  Neurological:  Negative for dizziness and headaches.  Hematological:  Negative for swollen glands.  Psychiatric/Behavioral:  Negative for depressed mood and sleep disturbance. The patient is not nervous/anxious.     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   Reactive arthritis of right knee ([place]) 04/14/2022   Essential thrombocytosis ([place]) 04/14/2022   Aortic atherosclerosis ([place]) 04/06/2022   Myalgia 04/06/2022   Fatigue 04/06/2022   Arthralgia of both knees 04/06/2022   Screening for heart disease 03/26/2022   Salivary gland swelling 12/02/2020   Fall 02/13/2020   Left arm pain 02/13/2020   Abdominal pain 12/07/2019   Hyperglycemia 06/07/2019   DDD (degenerative disc disease), cervical 02/02/2019   Musculoskeletal neck pain 10/14/2018   Change in bowel function 08/11/2018   Lower abdominal pain 08/11/2018   Diverticulitis 08/11/2018   Shoulder pain, left 02/23/2017   B12 deficiency 08/07/2016   Recurrent UTI 11/08/2015   Non-celiac gluten sensitivity    Anxiety state 01/17/2010   ALLERGIC RHINITIS 01/17/2010   Dyslipidemia 07/12/2009   Essential hypertension 07/12/2009   Osteoporosis 06/22/2008   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   ANEMIA, PERNICIOUS 10/14/2007   IRRITABLE BOWEL SYNDROME 10/14/2007    Past Medical History:  Diagnosis Date   ALLERGIC  RHINITIS    ANEMIA, PERNICIOUS    ANXIETY    BILIARY DYSKINESIA    CARPAL TUNNEL SYNDROME, LEFT    DYSLIPIDEMIA    Headache(784.0)    HYPERTENSION    Irritable bowel syndrome    Non-celiac gluten sensitivity    OSTEOPENIA    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HCC)    borderline    Family History  Problem Relation Age of Onset   Colitis Mother    Heart disease Mother    Hyperlipidemia Mother    Lung cancer Mother        Cause of death   Hypertension Mother    Lung cancer Father    Heart disease Brother    Heart attack Brother    Heart disease Brother    Valvular heart disease Brother    Heart attack Brother    Atrial fibrillation Brother    Heart failure Brother    Miscarriages / Stillbirths Brother    Valvular heart disease Brother    Leukemia Brother    Coronary artery disease Brother        MI in late 62's. Still living   Coronary artery disease Brother        MI in 62's. Also still  living.   Ovarian cancer Maternal Aunt    Diabetes Maternal Aunt    Colon cancer Neg Hx    Colon polyps Neg Hx    Past Surgical History:  Procedure Laterality Date   APPENDECTOMY     CARPAL TUNNEL RELEASE     CESAREAN SECTION  76 & 85   x's 2    CHOLECYSTECTOMY  2005   Dr. Dalbert Batman (Gallbladder dyskinesia)   COLONOSCOPY     DILATION AND CURETTAGE OF UTERUS     LEFT HEART CATHETERIZATION WITH CORONARY ANGIOGRAM N/A 05/19/2012   Procedure: LEFT HEART CATHETERIZATION WITH CORONARY ANGIOGRAM;  Surgeon: Hillary Bow, MD;  Location: [facility] CATH LAB;  Service: Cardiovascular;  Laterality: N/A;   ROTATOR CUFF REPAIR     TONSILLECTOMY     UPPER GASTROINTESTINAL ENDOSCOPY     Social History   Social History Narrative   Married and lives with husband.    Dental hygienist - retired   Product/process development scientist History  Administered Date(s) Administered   Fluad Quad(high Dose 65+) 06/08/2019, 06/27/2020, 07/03/2022   Influenza Split 06/17/2012   Influenza, High Dose Seasonal PF 06/06/2018    Influenza,inj,Quad PF,6+ Mos 07/10/2013, 08/16/2014, 08/07/2016, 08/11/2017   PFIZER Comirnaty(Gray Top)Covid-19 Tri-Sucrose Vaccine 12/14/2020   PFIZER(Purple Top)SARS-COV-2 Vaccination 10/02/2019, 10/23/2019, 06/19/2020, 12/13/2020   Pneumococcal Conjugate-13 06/08/2019   Pneumococcal Polysaccharide-23 06/27/2020   Td 09/08/2003   Tdap 11/08/2015   Zoster Recombinat (Shingrix) 06/18/2017, 10/06/2017     Objective: Vital Signs: BP 132/85 (BP Location: Left Arm, Patient Position: Sitting, Cuff Size: Normal)   Pulse 80   Ht 4' 11"$  (1.499 m)   Wt 102 lb (46.3 kg)   BMI 20.60 kg/m    Physical Exam Eyes:     Conjunctiva/sclera: Conjunctivae normal.  Cardiovascular:     Rate and Rhythm: Normal rate and regular rhythm.  Pulmonary:     Effort: Pulmonary effort is normal.     Breath sounds: Normal breath sounds.  Musculoskeletal:     Right lower leg: No edema.     Left lower leg: No edema.  Lymphadenopathy:     Cervical: No cervical adenopathy.  Skin:    General: Skin is warm and dry.     Findings: No rash.  Neurological:     Mental Status: She is alert.  Psychiatric:        Mood and Affect: Mood normal.      Musculoskeletal Exam:  Shoulders full ROM no tenderness or swelling Elbows full ROM no tenderness or swelling Wrists full ROM no tenderness or swelling Fingers full ROM no tenderness or swelling Knees with mild effusion present in right and posterior cyst on left, minimal tenderness to direct pressure Ankles full ROM no tenderness or swelling   Investigation: No additional findings.  Imaging: No results found.  Recent Labs: Lab Results  Component Value Date   WBC 9.3 10/05/2022   HGB 12.5 10/05/2022   PLT 330 10/05/2022   NA 134 (L) 10/05/2022   K 4.1 10/05/2022   CL 95 (L) 10/05/2022   CO2 28 10/05/2022   GLUCOSE 88 10/05/2022   BUN 6 (L) 10/05/2022   CREATININE 0.62 10/05/2022   BILITOT 0.8 10/05/2022   ALKPHOS 127 (H) 05/20/2022   AST 12  10/05/2022   ALT 7 10/05/2022   PROT 7.0 10/05/2022   ALBUMIN 4.1 05/20/2022   CALCIUM 9.9 10/05/2022   GFRAA >60 08/14/2018    Speciality Comments: No specialty comments available.  Procedures:  No procedures performed Allergies:  Fosamax [alendronate sodium], Hydrocodone, Hydrocodone-acetaminophen, Hydrocodone-acetaminophen, Oxycodone-acetaminophen, Oxycodone-acetaminophen, and Tizanidine   Assessment / Plan:     Visit Diagnoses: Reactive arthritis of right knee ([place]) - Plan: Sedimentation rate, C-reactive protein, CK, sulfaSALAzine (AZULFIDINE) 500 MG tablet, predniSONE (DELTASONE) 5 MG tablet  Persistent continued inflammation with objective synovitis in the knees and arthralgias in several other areas mostly in axial distribution. Possible new axial spondyloarthritis, with duration of symptoms now for many months past resolution of initial provocative infection. Checking ESR, CRP also a CK for disease activity assessment. Start sulfasalazine with titration up to 1000 mg BID or as tolerated. Repeat prednisone taper from 10 mg for 2 weeks while starting this regimen.  Baker cyst, left  Not as bad as initial presentation discussed this will recur secondary to knee inflammation no specific treatment needed. Consider brace/sleeve with swelling.   High risk medication use - Plan: CBC with Differential/Platelet, COMPLETE METABOLIC PANEL WITH GFR  Checking CBC and CMP for medication monitoring starting sulfasalazine. Discussed risks of medication including infections, cytopenias and need for monitoring, drug reactions.  Orders: Orders Placed This Encounter  Procedures   Sedimentation rate   C-reactive protein   CBC with Differential/Platelet   COMPLETE METABOLIC PANEL WITH GFR   CK   CK   Meds ordered this encounter  Medications   sulfaSALAzine (AZULFIDINE) 500 MG tablet    Sig: Take 2 tablets (1,000 mg total) by mouth 2 (two) times daily. Start with 1 tablet each dose then  increase to 1 tablet and 2 tablet each day then 2 tablets twice daily, increase each week    Dispense:  120 tablet    Refill:  1   predniSONE (DELTASONE) 5 MG tablet    Sig: Take 2 tablets (10 mg total) by mouth daily with breakfast for 7 days, THEN 1 tablet (5 mg total) daily with breakfast for 7 days.    Dispense:  21 tablet    Refill:  0     Follow-Up Instructions: Return in about 2 months (around 12/04/2022) for AxSpA SSZ start+GC f/u 19mo.   CCollier Salina MD  Note - This record has been created using [employer]  Chart creation errors have been sought, but may not always  have been located. Such creation errors do not reflect on  the standard of medical care.

## 2022-10-06 LAB — COMPLETE METABOLIC PANEL WITH GFR
AG Ratio: 1.7 (calc) (ref 1.0–2.5)
ALT: 7 U/L (ref 6–29)
AST: 12 U/L (ref 10–35)
Albumin: 4.4 g/dL (ref 3.6–5.1)
Alkaline phosphatase (APISO): 100 U/L (ref 37–153)
BUN/Creatinine Ratio: 10 (calc) (ref 6–22)
BUN: 6 mg/dL — ABNORMAL LOW (ref 7–25)
CO2: 28 mmol/L (ref 20–32)
Calcium: 9.9 mg/dL (ref 8.6–10.4)
Chloride: 95 mmol/L — ABNORMAL LOW (ref 98–110)
Creat: 0.62 mg/dL (ref 0.50–1.05)
Globulin: 2.6 g/dL (calc) (ref 1.9–3.7)
Glucose, Bld: 88 mg/dL (ref 65–99)
Potassium: 4.1 mmol/L (ref 3.5–5.3)
Sodium: 134 mmol/L — ABNORMAL LOW (ref 135–146)
Total Bilirubin: 0.8 mg/dL (ref 0.2–1.2)
Total Protein: 7 g/dL (ref 6.1–8.1)
eGFR: 96 mL/min/{1.73_m2} (ref >=60)

## 2022-10-06 LAB — CBC WITH DIFFERENTIAL/PLATELET
Absolute Monocytes: 735 cells/uL (ref 200–950)
Basophils Absolute: 28 cells/uL (ref 0–200)
Basophils Relative: 0.3 %
Eosinophils Absolute: 19 cells/uL (ref 15–500)
Eosinophils Relative: 0.2 %
HCT: 36 % (ref 35.0–45.0)
Hemoglobin: 12.5 g/dL (ref 11.7–15.5)
Lymphs Abs: 1395 cells/uL (ref 850–3900)
MCH: 29.7 pg (ref 27.0–33.0)
MCHC: 34.7 g/dL (ref 32.0–36.0)
MCV: 85.5 fL (ref 80.0–100.0)
MPV: 10.3 fL (ref 7.5–12.5)
Monocytes Relative: 7.9 %
Neutro Abs: 7124 cells/uL (ref 1500–7800)
Neutrophils Relative %: 76.6 %
Platelets: 330 10*3/uL (ref 140–400)
RBC: 4.21 10*6/uL (ref 3.80–5.10)
RDW: 12.3 % (ref 11.0–15.0)
Total Lymphocyte: 15 %
WBC: 9.3 10*3/uL (ref 3.8–10.8)

## 2022-10-06 LAB — C-REACTIVE PROTEIN: CRP: 46.9 mg/L — ABNORMAL HIGH (ref <8.0)

## 2022-10-06 LAB — CK: Total CK: 39 U/L (ref 29–143)

## 2022-10-06 LAB — SEDIMENTATION RATE: Sed Rate: 48 mm/h — ABNORMAL HIGH (ref 0–30)

## 2022-10-06 NOTE — Progress Notes (Signed)
Sedimentation rate and CRP are highly elevated again this is consistent with the inflammation noted on her exam today also. CK is normal so no evidence of significant muscle inflammation or damage. I recommend we go ahead with trying the sulfasalazine and prednisone as planned for the next step.

## 2022-10-23 ENCOUNTER — Encounter: Payer: Self-pay | Admitting: Internal Medicine

## 2022-11-17 ENCOUNTER — Telehealth: Payer: Self-pay

## 2022-11-17 NOTE — Telephone Encounter (Signed)
Called patient to schedule Medicare Annual Wellness Visit (AWV). Left message for patient to call back and schedule Medicare Annual Wellness Visit (AWV).  Last date of AWV: 11/25/22  Please schedule an AWV-S appointment at any time with [facility].   Norton Blizzard, [place] ([facility])  [place] [facility] [phone]

## 2022-11-17 NOTE — Telephone Encounter (Signed)
Contacted [name] to schedule their annual wellness visit. Appointment made for 11/26/22.  Norton Blizzard, [place] ([facility])  [place] [facility] [phone]

## 2022-11-20 ENCOUNTER — Other Ambulatory Visit: Payer: Self-pay

## 2022-11-20 ENCOUNTER — Other Ambulatory Visit: Payer: Self-pay | Admitting: Internal Medicine

## 2022-11-26 ENCOUNTER — Ambulatory Visit (INDEPENDENT_AMBULATORY_CARE_PROVIDER_SITE_OTHER): Payer: Medicare Other

## 2022-11-26 VITALS — Ht 59.0 in | Wt 108.0 lb

## 2022-11-26 DIAGNOSIS — Z0000 Encounter for general adult medical examination without abnormal findings: Secondary | ICD-10-CM

## 2022-11-26 NOTE — Patient Instructions (Addendum)
[name] , Thank you for taking time to come for your Medicare Wellness Visit. I appreciate your ongoing commitment to your health goals. Please review the following plan we discussed and let me know if I can assist you in the future.   These are the goals we discussed:  Goals      Client understands the importance of follow-up with providers by attending scheduled visits        This is a list of the screening recommended for you and due dates:  Health Maintenance  Topic Date Due   Mammogram  10/04/2021   COVID-19 Vaccine (6 - 2023-24 season) 05/08/2022   DEXA scan (bone density measurement)  10/02/2022   Medicare Annual Wellness Visit  11/26/2023   Colon Cancer Screening  05/22/2025   DTaP/Tdap/Td vaccine (3 - Td or Tdap) 11/07/2025   Pneumonia Vaccine  Completed   Flu Shot  Completed   Hepatitis C Screening: USPSTF Recommendation to screen - Ages 70-79 yo.  Completed   Zoster (Shingles) Vaccine  Completed   HPV Vaccine  Aged Out    Advanced directives: Yes  Conditions/risks identified: Yes  Next appointment: Follow up in one year for your annual wellness visit.   Preventive Care 53 Years and Older, Female Preventive care refers to lifestyle choices and visits with your health care provider that can promote health and wellness. What does preventive care include? A yearly physical exam. This is also called an annual well check. Dental exams once or twice a year. Routine eye exams. Ask your health care provider how often you should have your eyes checked. Personal lifestyle choices, including: Daily care of your teeth and gums. Regular physical activity. Eating a healthy diet. Avoiding tobacco and drug use. Limiting alcohol use. Practicing safe sex. Taking low-dose aspirin every day. Taking vitamin and mineral supplements as recommended by your health care provider. What happens during an annual well check? The services and screenings done by your health care provider  during your annual well check will depend on your age, overall health, lifestyle risk factors, and family history of disease. Counseling  Your health care provider may ask you questions about your: Alcohol use. Tobacco use. Drug use. Emotional well-being. Home and relationship well-being. Sexual activity. Eating habits. History of falls. Memory and ability to understand (cognition). Work and work Statistician. Reproductive health. Screening  You may have the following tests or measurements: Height, weight, and BMI. Blood pressure. Lipid and cholesterol levels. These may be checked every 5 years, or more frequently if you are over 70 years old. Skin check. Lung cancer screening. You may have this screening every year starting at age 70 if you have a 30-pack-year history of smoking and currently smoke or have quit within the past 15 years. Fecal occult blood test (FOBT) of the stool. You may have this test every year starting at age 70. Flexible sigmoidoscopy or colonoscopy. You may have a sigmoidoscopy every 5 years or a colonoscopy every 10 years starting at age 70. Hepatitis C blood test. Hepatitis B blood test. Sexually transmitted disease (STD) testing. Diabetes screening. This is done by checking your blood sugar (glucose) after you have not eaten for a while (fasting). You may have this done every 1-3 years. Bone density scan. This is done to screen for osteoporosis. You may have this done starting at age 70. Mammogram. This may be done every 1-2 years. Talk to your health care provider about how often you should have regular mammograms. Talk  with your health care provider about your test results, treatment options, and if necessary, the need for more tests. Vaccines  Your health care provider may recommend certain vaccines, such as: Influenza vaccine. This is recommended every year. Tetanus, diphtheria, and acellular pertussis (Tdap, Td) vaccine. You may need a Td booster every  10 years. Zoster vaccine. You may need this after age 64. Pneumococcal 13-valent conjugate (PCV13) vaccine. One dose is recommended after age 70. Pneumococcal polysaccharide (PPSV23) vaccine. One dose is recommended after age 70. Talk to your health care provider about which screenings and vaccines you need and how often you need them. This information is not intended to replace advice given to you by your health care provider. Make sure you discuss any questions you have with your health care provider. Document Released: 09/20/2015 Document Revised: 05/13/2016 Document Reviewed: 06/25/2015 Elsevier Interactive Patient Education  2017 [employer] Prevention in the Home Falls can cause injuries. They can happen to people of all ages. There are many things you can do to make your home safe and to help prevent falls. What can I do on the outside of my home? Regularly fix the edges of walkways and driveways and fix any cracks. Remove anything that might make you trip as you walk through a door, such as a raised step or threshold. Trim any bushes or trees on the path to your home. Use bright outdoor lighting. Clear any walking paths of anything that might make someone trip, such as rocks or tools. Regularly check to see if handrails are loose or broken. Make sure that both sides of any steps have handrails. Any raised decks and porches should have guardrails on the edges. Have any leaves, snow, or ice cleared regularly. Use sand or salt on walking paths during winter. Clean up any spills in your garage right away. This includes oil or grease spills. What can I do in the bathroom? Use night lights. Install grab bars by the toilet and in the tub and shower. Do not use towel bars as grab bars. Use non-skid mats or decals in the tub or shower. If you need to sit down in the shower, use a plastic, non-slip stool. Keep the floor dry. Clean up any water that spills on the floor as soon as it  happens. Remove soap buildup in the tub or shower regularly. Attach bath mats securely with double-sided non-slip rug tape. Do not have throw rugs and other things on the floor that can make you trip. What can I do in the bedroom? Use night lights. Make sure that you have a light by your bed that is easy to reach. Do not use any sheets or blankets that are too big for your bed. They should not hang down onto the floor. Have a firm chair that has side arms. You can use this for support while you get dressed. Do not have throw rugs and other things on the floor that can make you trip. What can I do in the kitchen? Clean up any spills right away. Avoid walking on wet floors. Keep items that you use a lot in easy-to-reach places. If you need to reach something above you, use a strong step stool that has a grab bar. Keep electrical cords out of the way. Do not use floor polish or wax that makes floors slippery. If you must use wax, use non-skid floor wax. Do not have throw rugs and other things on the floor that can make you  trip. What can I do with my stairs? Do not leave any items on the stairs. Make sure that there are handrails on both sides of the stairs and use them. Fix handrails that are broken or loose. Make sure that handrails are as long as the stairways. Check any carpeting to make sure that it is firmly attached to the stairs. Fix any carpet that is loose or worn. Avoid having throw rugs at the top or bottom of the stairs. If you do have throw rugs, attach them to the floor with carpet tape. Make sure that you have a light switch at the top of the stairs and the bottom of the stairs. If you do not have them, ask someone to add them for you. What else can I do to help prevent falls? Wear shoes that: Do not have high heels. Have rubber bottoms. Are comfortable and fit you well. Are closed at the toe. Do not wear sandals. If you use a stepladder: Make sure that it is fully opened.  Do not climb a closed stepladder. Make sure that both sides of the stepladder are locked into place. Ask someone to hold it for you, if possible. Clearly mark and make sure that you can see: Any grab bars or handrails. First and last steps. Where the edge of each step is. Use tools that help you move around (mobility aids) if they are needed. These include: Canes. Walkers. Scooters. Crutches. Turn on the lights when you go into a dark area. Replace any light bulbs as soon as they burn out. Set up your furniture so you have a clear path. Avoid moving your furniture around. If any of your floors are uneven, fix them. If there are any pets around you, be aware of where they are. Review your medicines with your doctor. Some medicines can make you feel dizzy. This can increase your chance of falling. Ask your doctor what other things that you can do to help prevent falls. This information is not intended to replace advice given to you by your health care provider. Make sure you discuss any questions you have with your health care provider. Document Released: 06/20/2009 Document Revised: 01/30/2016 Document Reviewed: 09/28/2014 Elsevier Interactive Patient Education  2017 [employer].

## 2022-11-26 NOTE — Progress Notes (Addendum)
I connected with  [name] on 11/26/22 by a audio enabled telemedicine application and verified that I am speaking with the correct person using two identifiers.  Patient Location: Home  Provider Location: Office/Clinic  I discussed the limitations of evaluation and management by telemedicine. The patient expressed understanding and agreed to proceed.  Patient Medicare AWV questionnaire was completed by the patient on 11/25/2022; I have confirmed that all information answered by patient is correct and no changes since this date.    Subjective:   [name] is a 72 y.o. female who presents for Medicare Annual (Subsequent) preventive examination.  Review of Systems     Cardiac Risk Factors include: advanced age (>75men, >78 women);dyslipidemia;hypertension;family history of premature cardiovascular disease     Objective:    Today's Vitals   11/26/22 0947 11/26/22 0948  Weight: 108 lb (49 kg)   Height: 4\' 11"  (1.499 m)   PainSc: 0-No pain 0-No pain   Body mass index is 21.81 kg/m.     11/26/2022    9:52 AM 04/02/2022    5:06 PM 11/24/2021    3:34 PM 11/06/2020   11:58 AM 06/08/2019    8:47 AM 08/11/2018    1:14 PM 05/09/2015    2:52 PM  Advanced Directives  Does Patient Have a Medical Advance Directive? Yes Yes Yes Yes Yes No No  Type of Paramedic of [place];Living will [place];Living will [place];Living will Living will;Healthcare Power of [place];Living will    Does patient want to make changes to medical advance directive?    No - Patient declined     Copy of [place] in Chart? No - copy requested  No - copy requested No - copy requested No - copy requested    Would patient like information on creating a medical advance directive?      No - Patient declined No - patient declined information    Current Medications (verified) Outpatient Encounter  Medications as of 11/26/2022  Medication Sig   ALPRAZolam (XANAX) 0.25 MG tablet TAKE 1 TABLET(0.25 MG) BY MOUTH TWICE DAILY AS NEEDED FOR ANXIETY   amLODipine (NORVASC) 10 MG tablet Take 1 tablet (10 mg total) by mouth daily.   cetirizine (ZYRTEC) 10 MG tablet Take 10 mg by mouth daily.   hydrochlorothiazide (HYDRODIURIL) 12.5 MG tablet Take 1 tablet (12.5 mg total) by mouth daily.   methocarbamol (ROBAXIN) 500 MG tablet TAKE 1 TABLET(500 MG) BY MOUTH TWICE DAILY AS NEEDED FOR MUSCLE SPASMS   pravastatin (PRAVACHOL) 40 MG tablet TAKE 1 TABLET(40 MG) BY MOUTH DAILY   sulfaSALAzine (AZULFIDINE) 500 MG tablet Take 2 tablets (1,000 mg total) by mouth 2 (two) times daily. Start with 1 tablet each dose then increase to 1 tablet and 2 tablet each day then 2 tablets twice daily, increase each week   No facility-administered encounter medications on file as of 11/26/2022.    Allergies (verified) Fosamax [alendronate sodium], Hydrocodone, Hydrocodone-acetaminophen, Hydrocodone-acetaminophen, Oxycodone-acetaminophen, Oxycodone-acetaminophen, and Tizanidine   History: Past Medical History:  Diagnosis Date   ALLERGIC RHINITIS    ANEMIA, PERNICIOUS    ANXIETY    BILIARY DYSKINESIA    CARPAL TUNNEL SYNDROME, LEFT    DYSLIPIDEMIA    Headache(784.0)    HYPERTENSION    Irritable bowel syndrome    Non-celiac gluten sensitivity    OSTEOPENIA    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HCC)    borderline   Past  Surgical History:  Procedure Laterality Date   APPENDECTOMY     CARPAL TUNNEL RELEASE     CESAREAN SECTION  76 & 85   x's 2    CHOLECYSTECTOMY  2005   Dr. Dalbert Batman (Gallbladder dyskinesia)   COLONOSCOPY     DILATION AND CURETTAGE OF UTERUS     LEFT HEART CATHETERIZATION WITH CORONARY ANGIOGRAM N/A 05/19/2012   Procedure: LEFT HEART CATHETERIZATION WITH CORONARY ANGIOGRAM;  Surgeon: Hillary Bow, MD;  Location: [facility] CATH LAB;  Service: Cardiovascular;  Laterality: N/A;   ROTATOR CUFF REPAIR      TONSILLECTOMY     UPPER GASTROINTESTINAL ENDOSCOPY     Family History  Problem Relation Age of Onset   Colitis Mother    Heart disease Mother    Hyperlipidemia Mother    Lung cancer Mother        Cause of death   Hypertension Mother    Lung cancer Father    Heart disease Brother    Heart attack Brother    Heart disease Brother    Valvular heart disease Brother    Heart attack Brother    Atrial fibrillation Brother    Heart failure Brother    Miscarriages / Stillbirths Brother    Valvular heart disease Brother    Leukemia Brother    Coronary artery disease Brother        MI in late 17's. Still living   Coronary artery disease Brother        MI in 62's. Also still living.   Ovarian cancer Maternal Aunt    Diabetes Maternal Aunt    Colon cancer Neg Hx    Colon polyps Neg Hx    Social History   Socioeconomic History   Marital status: Married    Spouse name: Not on file   Number of children: Not on file   Years of education: Not on file   Highest education level: Not on file  Occupational History   Occupation: Dental office/ Retired    Fish farm manager: [employer]  Tobacco Use   Smoking status: Never    Passive exposure: Past   Smokeless tobacco: Never  Vaping Use   Vaping Use: Never used  Substance and Sexual Activity   Alcohol use: Yes    Comment: occasionally   Drug use: No   Sexual activity: Yes    Birth control/protection: Post-menopausal  Other Topics Concern   Not on file  Social History Narrative   Married and lives with husband.    Dental hygienist - retired   Scientist, physiological Strain: [place]  (11/26/2022)   Overall Financial Resource Strain (CARDIA)    Difficulty of Paying Living Expenses: Not hard at all  Food Insecurity: No Food Insecurity (11/26/2022)   Hunger Vital Sign    Worried About Running Out of Food in the Last Year: Never true    [place] in the Last Year: Never true  Transportation Needs: No  Transportation Needs (11/26/2022)   PRAPARE - Hydrologist (Medical): No    Lack of Transportation (Non-Medical): No  Physical Activity: Insufficiently Active (11/26/2022)   Exercise Vital Sign    Days of Exercise per Week: 3 days    Minutes of Exercise per Session: 40 min  Stress: No Stress Concern Present (11/26/2022)   [place]    Feeling of Stress : Only a little  Social Connections: Unknown (11/26/2022)   Social Connection and Isolation Panel [NHANES]    Frequency of Communication with Friends and Family: Three times a week    Frequency of Social Gatherings with Friends and Family: Three times a week    Attends Religious Services: Not on file    Active Member of Clubs or Organizations: No    Attends Archivist Meetings: Never    Marital Status: Married    Tobacco Counseling Counseling given: Not Answered   Clinical Intake:  Pre-visit preparation completed: Yes  Pain : No/denies pain Pain Score: 0-No pain     BMI - recorded: 21.81 Nutritional Status: BMI of 19-24  Normal Nutritional Risks: None Diabetes: No  How often do you need to have someone help you when you read instructions, pamphlets, or other written materials from your doctor or pharmacy?: 1 - Never What is the last grade level you completed in school?: Retired Dental Hygientist  Diabetic? No  Interpreter Needed?: No  Information entered by :: Lisette Abu, LPN.   Activities of Daily Living    11/26/2022    9:53 AM 11/25/2022    8:01 AM  In your present state of health, do you have any difficulty performing the following activities:  Hearing? 0 0  Vision? 0 0  Difficulty concentrating or making decisions? 0 0  Walking or climbing stairs? 1 1  Dressing or bathing? 0 0  Doing errands, shopping? 0 0  Preparing Food and eating ? N N  Using the Toilet? N N  In the past six months, have you  accidently leaked urine? N N  Do you have problems with loss of bowel control? N N  Managing your Medications? N N  Managing your Finances? N N  Housekeeping or managing your Housekeeping? N N    Patient Care Team: [name], MD as PCP - General (Internal Medicine) Thurman Coyer, DO as Consulting Physician (Sports Medicine) Maisie Fus, MD (Inactive) (Obstetrics and Gynecology) Kristeen Miss, MD (Neurosurgery) Hillary Bow, MD (Cardiology) Gatha Mayer, MD (Gastroenterology) Charlton Haws, [facility] as Pharmacist (Pharmacist) Alla Feeling, NP as Nurse Practitioner (Hematology and Oncology) Christain Sacramento, OD as Referring Physician (Optometry)  Indicate any recent Medical Services you may have received from other than [facility] providers in the past year (date may be approximate).     Assessment:   This is a routine wellness examination for [name].  Hearing/Vision screen Hearing Screening - Comments:: Denies hearing difficulties. Vision Screening - Comments:: Wears rx glasses - up to date with routine eye exams with Christain Sacramento, OD.   Dietary issues and exercise activities discussed: Current Exercise Habits: Home exercise routine, Type of exercise: walking;Other - see comments (yard work), Time (Minutes): 40, Frequency (Times/Week): 3, Weekly Exercise (Minutes/Week): 120, Intensity: Moderate, Exercise limited by: None identified   Goals Addressed             This Visit's Progress    Client understands the importance of follow-up with providers by attending scheduled visits        Depression Screen    11/26/2022    9:58 AM 04/14/2022   10:49 AM 04/06/2022    3:04 PM 03/26/2022    9:28 AM 11/24/2021    3:35 PM 11/24/2021    3:32 PM 11/06/2020   11:56 AM  PHQ 2/9 Scores  PHQ - 2 Score 0 0 0 0 0 0 0  PHQ- 9 Score 0 7 5 0  Fall Risk    11/26/2022    9:52 AM 11/25/2022    8:01 AM 05/20/2022    2:28 PM 05/20/2022    2:25 PM 04/14/2022   10:48 AM  Fall  Risk   Falls in the past year? 0 0 0 0 0  Number falls in past yr: 0 0 0 0 0  Injury with Fall? 0 0 0 0 0  Risk for fall due to : No Fall Risks  No Fall Risks No Fall Risks No Fall Risks  Follow up Falls prevention discussed  Falls evaluation completed Falls evaluation completed Falls evaluation completed    [place]:  Any stairs in or around the home? Yes  If so, are there any without handrails? No  Home free of loose throw rugs in walkways, pet beds, electrical cords, etc? Yes  Adequate lighting in your home to reduce risk of falls? Yes   ASSISTIVE DEVICES UTILIZED TO PREVENT FALLS:  Life alert? No  Use of a cane, walker or w/c? No  Grab bars in the bathroom? Yes  Shower chair or bench in shower? No  Elevated toilet seat or a handicapped toilet? Yes   TIMED UP AND GO:  Was the test performed? No . Telephonic Visit  Cognitive Function:        11/26/2022    9:54 AM  6CIT Screen  What Year? 0 points  What month? 0 points  What time? 0 points  Count back from 20 0 points  Months in reverse 0 points  Repeat phrase 0 points  Total Score 0 points    Immunizations Immunization History  Administered Date(s) Administered   Fluad Quad(high Dose 65+) 06/08/2019, 06/27/2020, 07/03/2022   Influenza Split 06/17/2012   Influenza, High Dose Seasonal PF 06/06/2018   Influenza,inj,Quad PF,6+ Mos 07/10/2013, 08/16/2014, 08/07/2016, 08/11/2017   PFIZER Comirnaty(Gray Top)Covid-19 Tri-Sucrose Vaccine 12/14/2020   PFIZER(Purple Top)SARS-COV-2 Vaccination 10/02/2019, 10/23/2019, 06/19/2020, 12/13/2020   Pneumococcal Conjugate-13 06/08/2019   Pneumococcal Polysaccharide-23 06/27/2020   Td 09/08/2003   Tdap 11/08/2015   Zoster Recombinat (Shingrix) 06/18/2017, 10/06/2017    TDAP status: Up to date  Flu Vaccine status: Up to date  Pneumococcal vaccine status: Up to date  Covid-19 vaccine status: Information provided on how to obtain vaccines.    Qualifies for Shingles Vaccine? Yes   Zostavax completed No   Shingrix Completed?: Yes  Screening Tests Health Maintenance  Topic Date Due   MAMMOGRAM  10/04/2021   COVID-19 Vaccine (6 - 2023-24 season) 05/08/2022   DEXA SCAN  10/02/2022   Medicare Annual Wellness (AWV)  11/26/2023   COLONOSCOPY (Pts 45-10yrs Insurance coverage will need to be confirmed)  05/22/2025   DTaP/Tdap/Td (3 - Td or Tdap) 11/07/2025   Pneumonia Vaccine 13+ Years old  Completed   INFLUENZA VACCINE  Completed   Hepatitis C Screening  Completed   Zoster Vaccines- Shingrix  Completed   HPV VACCINES  Aged Out    Health Maintenance  Health Maintenance Due  Topic Date Due   MAMMOGRAM  10/04/2021   COVID-19 Vaccine (6 - 2023-24 season) 05/08/2022   DEXA SCAN  10/02/2022    Colorectal cancer screening: Type of screening: Colonoscopy. Completed 05/23/2015. Repeat every 10 years  Mammogram status: Completed 10/04/2020. Repeat every year Patient is scheduled to have done with OB/GYN  Bone Density status: Completed 10/02/2020. Results reflect: Bone density results: OSTEOPOROSIS. Repeat every 2 years.Patient is scheduled to have done with OB/GYN  Lung Cancer Screening: (  Low Dose CT Chest recommended if Age 45-80 years, 30 pack-year currently smoking OR have quit w/in 15years.) does not qualify.   Lung Cancer Screening Referral: no  Additional Screening:  Hepatitis C Screening: does qualify; Completed 11/22/2015  Vision Screening: Recommended annual ophthalmology exams for early detection of glaucoma and other disorders of the eye. Is the patient up to date with their annual eye exam?  Yes  Who is the provider or what is the name of the office in which the patient attends annual eye exams? Staci Palmer, OD. If pt is not established with a provider, would they like to be referred to a provider to establish care? No .   Dental Screening: Recommended annual dental exams for proper oral hygiene  Community  Resource Referral / Chronic Care Management: CRR required this visit?  No   CCM required this visit?  No      Plan:     I have personally reviewed and noted the following in the patient's chart:   Medical and social history Use of alcohol, tobacco or illicit drugs  Current medications and supplements including opioid prescriptions. Patient is not currently taking opioid prescriptions. Functional ability and status Nutritional status Physical activity Advanced directives List of other physicians Hospitalizations, surgeries, and ER visits in previous 12 months Vitals Screenings to include cognitive, depression, and falls Referrals and appointments  In addition, I have reviewed and discussed with patient certain preventive protocols, quality metrics, and best practice recommendations. A written personalized care plan for preventive services as well as general preventive health recommendations were provided to patient.     Sheral Flow, LPN   [id number]   Nurse Notes:  Normal cognitive status assessed by direct observation by this Nurse Health Advisor. No abnormalities found.   Patient Medicare AWV questionnaire was completed by the patient on 11/25/2022; I have confirmed that all information answered by patient is correct and no changes since this date.

## 2022-12-02 ENCOUNTER — Encounter: Payer: Self-pay | Admitting: Internal Medicine

## 2022-12-02 ENCOUNTER — Ambulatory Visit: Payer: Medicare Other | Attending: Internal Medicine | Admitting: Internal Medicine

## 2022-12-02 VITALS — BP 173/84 | HR 69 | Resp 14 | Ht 59.0 in | Wt 114.2 lb

## 2022-12-02 DIAGNOSIS — M7122 Synovial cyst of popliteal space [Baker], left knee: Secondary | ICD-10-CM | POA: Diagnosis not present

## 2022-12-02 DIAGNOSIS — D802 Selective deficiency of immunoglobulin A [IgA]: Secondary | ICD-10-CM | POA: Insufficient documentation

## 2022-12-02 DIAGNOSIS — Z79899 Other long term (current) drug therapy: Secondary | ICD-10-CM | POA: Diagnosis not present

## 2022-12-02 DIAGNOSIS — M02361 Reiter's disease, right knee: Secondary | ICD-10-CM | POA: Diagnosis not present

## 2022-12-02 NOTE — Progress Notes (Signed)
Office Visit Note  Patient: [name]             Date of Birth: July 28, 1951           MRN: [id number]             PCP: Pincus Sanes, MD Referring: Pincus Sanes, MD Visit Date: 12/02/2022   Subjective:  Follow-up (Patient states she is unsure that the SSZ is working. Patient states that she will go through 3-4 days that her whole body is hurting. Patient states that it happened this weekend. )   History of Present Illness: [name] is a 72 y.o. female here for follow up for inflammatory arthritis suspected seronegative arthritis.  Since last visit she started the sulfasalazine and completed prednisone taper.  She did not tolerate titration to greater than 500 mg twice daily dose due to noticing headaches and GI irritation with some abdominal pain and nausea.  Joint symptoms are partially improved she is not having large amounts of visible swelling.  But still getting body pain pretty diffusely still worst is at the knees and having symptoms about 3 to 4 days out of the week.  Previous HPI 10/05/22 [name] is a 72 y.o. female here for follow up for inflammatory arthritis suspect reactive arthritis or seronegative arthritis. Since finishing the prednisone treatment her symptoms worsened after about 1 week later. Pretty much hurting diffusely, gets stiff all over and hard to get up and move sometimes.. Her knees have become swollen again with slightly reduced ROM.    Previous HPI 06/29/22 [name] is a 72 y.o. female here for follow up for lower extremity pain and joint swelling on prednisone tapered after last visit form 20 mg daily down to 5 mg. She notices an increase in symptoms describes a sensation of pressure of squeezing on her low back and legs. Also developed a tender nodule in the left buttock and subsequent overlying bruising. Knee pains without erythema, left knee bakers cyst feels smaller. She still feels sensation on swelling or pillow sensation on sole  of the foot.   Previous HPI 06/05/2022 [name] is a 72 y.o. female here for evaluation of lower extremity joint pain and swelling, weakness, and elevated inflammatory markers. Symptoms started around the end of July this year. Preceding onset of back and knee pains she was treated with ciprofloxacin for UTI with pseudomonas and staph equorum on culture. A week later [name] ED visit with CT scan of the abdomen demonstrated probable gastroenteritis, at the time with constipation and abdominal pain. Never reported any blood diarrhea and no specific infection identified thought likely viral. She developed knee pain and decreased mobility several days later. Initial treatments with gabapentin, flexeril, and prednisone were helpful. Right knee xray showed mild osteoarthritis and trace effusion otherwise unremarkable. She was found to have elevated sedimentation rate, mild anemia with increased platelets, mild hypogammaglobulinemia of IgA, and low thiamine. She was also started on supplementation for this and saw Dr. Terrace Arabia with neurology. Neurologic exam was intact, MRI of cervical spine obtained showing multilevel degenerative changes without significant impingement in evidence. Persistent gait difficulty and was started on prednisone 20 mg daily with PCP office for concern of possible PMR and some symptom improvement. Nodule or swelling in the posterior left knee and in left heel also seen with PCP f/u.  Today she reports feeling pretty well still has some symptoms at the left knee but mostly mild bigger  complaint is the posterior nodule or swelling. She is not experiencing any visual changes or eye pain.  No noticeable lymphadenopathy.  Urinary symptoms have remained improved after treatment of the UTI.  She was not tolerating good oral intake during July and August with all of the symptoms going on but this is now improved.   Labs reviewed 05/2022 ANA neg ESR 87 CRP 3.3 BMP wnl Vit B1 45    04/2022 CK 17 Ferritin 350 Iron 5 TIBC 211 Sat 2% SPEP- acute phase reaction proteins BMP Na 128 K 3.2 Cl 86 Lyme WB neg   03/2022 ANA neg CCP neg RF neg ESR 37 CRP 11.4 BMP Na 128 K 3.0 Cl 85   Review of Systems  Constitutional:  Positive for fatigue.  HENT:  Positive for mouth dryness. Negative for mouth sores.   Eyes:  Positive for dryness.  Respiratory:  Negative for shortness of breath.   Cardiovascular:  Positive for palpitations. Negative for chest pain.  Gastrointestinal:  Negative for blood in stool, constipation and diarrhea.  Endocrine: Negative for increased urination.  Genitourinary:  Negative for involuntary urination.  Musculoskeletal:  Positive for joint pain, joint pain, joint swelling, myalgias, muscle weakness, morning stiffness, muscle tenderness and myalgias. Negative for gait problem.  Skin:  Negative for color change, rash, hair loss and sensitivity to sunlight.  Allergic/Immunologic: Negative for susceptible to infections.  Neurological:  Positive for headaches. Negative for dizziness.  Hematological:  Negative for swollen glands.  Psychiatric/Behavioral:  Positive for sleep disturbance. Negative for depressed mood. The patient is not nervous/anxious.     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   Multifocal motor neuropathy (HCC) 04/14/2022   Reactive arthritis of right knee (HCC) 04/14/2022   Essential thrombocytosis (HCC) 04/14/2022   Aortic atherosclerosis (HCC) 04/06/2022   Myalgia 04/06/2022   Fatigue 04/06/2022   Arthralgia of both knees 04/06/2022   Screening for heart disease 03/26/2022   Salivary gland  swelling 12/02/2020   Fall 02/13/2020   Left arm pain 02/13/2020   Abdominal pain 12/07/2019   Hyperglycemia 06/07/2019   DDD (degenerative disc disease), cervical 02/02/2019   Musculoskeletal neck pain 10/14/2018   Change in bowel function 08/11/2018   Lower abdominal pain 08/11/2018   Diverticulitis 08/11/2018   Shoulder pain, left 02/23/2017   B12 deficiency 08/07/2016   Recurrent UTI 11/08/2015   Non-celiac gluten sensitivity    Anxiety state 01/17/2010   ALLERGIC RHINITIS 01/17/2010   Dyslipidemia 07/12/2009   Essential hypertension 07/12/2009   Osteoporosis 06/22/2008   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   ANEMIA, PERNICIOUS 10/14/2007   IRRITABLE BOWEL SYNDROME 10/14/2007    Past Medical History:  Diagnosis Date   ALLERGIC RHINITIS    ANEMIA, PERNICIOUS    ANXIETY    BILIARY DYSKINESIA    CARPAL TUNNEL SYNDROME, LEFT    DYSLIPIDEMIA    Headache(784.0)    HYPERTENSION    Irritable bowel syndrome    Non-celiac gluten sensitivity    OSTEOPENIA    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HCC)    borderline    Family History  Problem Relation Age of Onset   Colitis Mother    Heart disease Mother  Hyperlipidemia Mother    Lung cancer Mother        Cause of death   Hypertension Mother    Lung cancer Father    Heart disease Brother    Heart attack Brother    Heart disease Brother    Valvular heart disease Brother    Heart attack Brother    Atrial fibrillation Brother    Heart failure Brother    Miscarriages / Stillbirths Brother    Valvular heart disease Brother    Leukemia Brother    Coronary artery disease Brother        MI in late 68's. Still living   Coronary artery disease Brother        MI in 76's. Also still living.   Ovarian cancer Maternal Aunt    Diabetes Maternal Aunt    Colon cancer Neg Hx    Colon polyps Neg Hx    Past Surgical History:  Procedure Laterality Date   APPENDECTOMY     CARPAL TUNNEL RELEASE     CESAREAN SECTION  76  & 85   x's 2    CHOLECYSTECTOMY  2005   Dr. Derrell Lolling (Gallbladder dyskinesia)   COLONOSCOPY     DILATION AND CURETTAGE OF UTERUS     LEFT HEART CATHETERIZATION WITH CORONARY ANGIOGRAM N/A 05/19/2012   Procedure: LEFT HEART CATHETERIZATION WITH CORONARY ANGIOGRAM;  Surgeon: Herby Abraham, MD;  Location: [facility] CATH LAB;  Service: Cardiovascular;  Laterality: N/A;   ROTATOR CUFF REPAIR     TONSILLECTOMY     UPPER GASTROINTESTINAL ENDOSCOPY     Social History   Social History Narrative   Married and lives with husband.    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 History  Administered Date(s) Administered   Fluad Quad(high Dose 65+) 06/08/2019, 06/27/2020, 07/03/2022   Influenza Split 06/17/2012   Influenza, High Dose Seasonal PF 06/06/2018   Influenza,inj,Quad PF,6+ Mos 07/10/2013, 08/16/2014, 08/07/2016, 08/11/2017   PFIZER Comirnaty(Gray Top)Covid-19 Tri-Sucrose Vaccine 12/14/2020   PFIZER(Purple Top)SARS-COV-2 Vaccination 10/02/2019, 10/23/2019, 06/19/2020, 12/13/2020   Pneumococcal Conjugate-13 06/08/2019   Pneumococcal Polysaccharide-23 06/27/2020   Td 09/08/2003   Tdap 11/08/2015   Zoster Recombinat (Shingrix) 06/18/2017, 10/06/2017     Objective: Vital Signs: BP (!) 173/84 (BP Location: Left Arm, Patient Position: Sitting, Cuff Size: Small)   Pulse 69   Resp 14   Ht 4\' 11"  (1.499 m)   Wt 114 lb 3.2 oz (51.8 kg)   BMI 23.07 kg/m    Physical Exam Cardiovascular:     Rate and Rhythm: Normal rate and regular rhythm.  Pulmonary:     Effort: Pulmonary effort is normal.     Breath sounds: Normal breath sounds.  Musculoskeletal:     Right lower leg: No edema.     Left lower leg: No edema.  Lymphadenopathy:     Cervical: No cervical adenopathy.  Skin:    General: Skin is warm and dry.     Findings: No rash.  Neurological:     Mental Status: She is alert.  Psychiatric:        Mood and Affect: Mood normal.      Musculoskeletal Exam:  Neck full ROM no  tenderness Shoulders full ROM no tenderness or swelling Elbows full ROM no tenderness or swelling Wrists full ROM no tenderness or swelling Fingers full ROM no tenderness or swelling Knees full ROM no tenderness or swelling Ankles full ROM no tenderness or swelling   Investigation: No additional findings.  Imaging: No  results found.  Recent Labs: Lab Results  Component Value Date   WBC 6.9 12/02/2022   HGB 13.0 12/02/2022   PLT 241 12/02/2022   NA 134 (L) 10/05/2022   K 4.1 10/05/2022   CL 95 (L) 10/05/2022   CO2 28 10/05/2022   GLUCOSE 88 10/05/2022   BUN 6 (L) 10/05/2022   CREATININE 0.62 10/05/2022   BILITOT 0.8 10/05/2022   ALKPHOS 127 (H) 05/20/2022   AST 12 10/05/2022   ALT 7 10/05/2022   PROT 7.0 10/05/2022   ALBUMIN 4.1 05/20/2022   CALCIUM 9.9 10/05/2022   GFRAA >60 08/14/2018    Speciality Comments: No specialty comments available.  Procedures:  No procedures performed Allergies: Fosamax [alendronate sodium], Hydrocodone, Hydrocodone-acetaminophen, Hydrocodone-acetaminophen, Oxycodone-acetaminophen, Oxycodone-acetaminophen, and Tizanidine   Assessment / Plan:     Visit Diagnoses: Reactive arthritis of right knee (HCC) - Plan: Sedimentation rate, C-reactive protein  Rechecking symptoms rate and CRP these were markedly elevated at last visit after finishing prednisone treatment but not much peripheral synovitis appreciable on exam today.  Recommend continuing the sulfasalazine 500 mg twice daily.  Clinically does not look bad enough today to recommend escalating DMARD treatment or resuming prednisone taper unless having flareup or inflammation becomes more apparent.  Could consider switch to methotrexate if worsening going forward.  Most reasonable to continue as needed NSAIDs with ibuprofen during symptomatic days.  High risk medication use - Plan: CBC with Differential/Platelet, COMPLETE METABOLIC PANEL WITH GFR  Checking CBC and CMP for medication  monitoring on sulfasalazine.  Had some GI intolerance.  No interval significant infections.  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HCC)  Discussed mildly low IgA on previous lab test which has been consistently noted.  She does not have any history of characteristic chronic infections this would usually be associated with frequent sinusitis or other upper respiratory disease as well as possible other infections.   Orders: Orders Placed This Encounter  Procedures   Sedimentation rate   C-reactive protein   CBC with Differential/Platelet   COMPLETE METABOLIC PANEL WITH GFR   No orders of the defined types were placed in this encounter.    Follow-Up Instructions: Return in about 3 months (around 03/04/2023) for ReA SSZ f/u 3mos.   Fuller Plan, MD  Note - This record has been created using [employer].  Chart creation errors have been sought, but may not always  have been located. Such creation errors do not reflect on  the standard of medical care.

## 2022-12-03 LAB — COMPLETE METABOLIC PANEL WITH GFR
AG Ratio: 2.3 (calc) (ref 1.0–2.5)
ALT: 13 U/L (ref 6–29)
AST: 20 U/L (ref 10–35)
Albumin: 4.9 g/dL (ref 3.6–5.1)
Alkaline phosphatase (APISO): 95 U/L (ref 37–153)
BUN: 7 mg/dL (ref 7–25)
CO2: 29 mmol/L (ref 20–32)
Calcium: 10 mg/dL (ref 8.6–10.4)
Chloride: 96 mmol/L — ABNORMAL LOW (ref 98–110)
Creat: 0.6 mg/dL (ref 0.50–1.05)
Globulin: 2.1 g/dL (calc) (ref 1.9–3.7)
Glucose, Bld: 92 mg/dL (ref 65–99)
Potassium: 3.9 mmol/L (ref 3.5–5.3)
Sodium: 134 mmol/L — ABNORMAL LOW (ref 135–146)
Total Bilirubin: 0.5 mg/dL (ref 0.2–1.2)
Total Protein: 7 g/dL (ref 6.1–8.1)
eGFR: 97 mL/min/{1.73_m2} (ref >=60)

## 2022-12-03 LAB — CBC WITH DIFFERENTIAL/PLATELET
Absolute Monocytes: 511 cells/uL (ref 200–950)
Basophils Absolute: 28 cells/uL (ref 0–200)
Basophils Relative: 0.4 %
Eosinophils Absolute: 97 cells/uL (ref 15–500)
Eosinophils Relative: 1.4 %
HCT: 38.9 % (ref 35.0–45.0)
Hemoglobin: 13 g/dL (ref 11.7–15.5)
Lymphs Abs: 1339 cells/uL (ref 850–3900)
MCH: 29.1 pg (ref 27.0–33.0)
MCHC: 33.4 g/dL (ref 32.0–36.0)
MCV: 87.2 fL (ref 80.0–100.0)
MPV: 10.6 fL (ref 7.5–12.5)
Monocytes Relative: 7.4 %
Neutro Abs: 4927 cells/uL (ref 1500–7800)
Neutrophils Relative %: 71.4 %
Platelets: 241 10*3/uL (ref 140–400)
RBC: 4.46 10*6/uL (ref 3.80–5.10)
RDW: 13.4 % (ref 11.0–15.0)
Total Lymphocyte: 19.4 %
WBC: 6.9 10*3/uL (ref 3.8–10.8)

## 2022-12-03 LAB — C-REACTIVE PROTEIN: CRP: 1.1 mg/L (ref <8.0)

## 2022-12-03 LAB — SEDIMENTATION RATE: Sed Rate: 2 mm/h (ref 0–30)

## 2022-12-29 ENCOUNTER — Encounter: Payer: Self-pay | Admitting: Internal Medicine

## 2022-12-29 NOTE — Telephone Encounter (Signed)
Patient is on sulfasalazine 500 mg twice daily which was last ordered on 10/05/2022. Please advise.

## 2023-01-03 NOTE — Progress Notes (Unsigned)
Subjective:    Patient ID: [name], female    DOB: 1951/02/23, 72 y.o.   MRN: [id number]     HPI [name] is here for follow up of her chronic medical problems.    Still having body pain - taking sulfasalazine 1 pill bid - did not tolerate the higher dose.  She is doing yard work.  She is active and gets a lot of steps daily.  Her muscles feel weak.  She is concerned about the medication and is not sure if it is helping as much as it should.  She wonders if she should stop taking it.    Medications and allergies reviewed with patient and updated if appropriate.  Current Outpatient Medications on File Prior to Visit  Medication Sig Dispense Refill   ALPRAZolam (XANAX) 0.25 MG tablet TAKE 1 TABLET(0.25 MG) BY MOUTH TWICE DAILY AS NEEDED FOR ANXIETY 60 tablet 0   amLODipine (NORVASC) 10 MG tablet Take 1 tablet (10 mg total) by mouth daily. 90 tablet 2   cetirizine (ZYRTEC) 10 MG tablet Take 10 mg by mouth daily.     hydrochlorothiazide (HYDRODIURIL) 12.5 MG tablet Take 1 tablet (12.5 mg total) by mouth daily. 90 tablet 3   methocarbamol (ROBAXIN) 500 MG tablet TAKE 1 TABLET(500 MG) BY MOUTH TWICE DAILY AS NEEDED FOR MUSCLE SPASMS 90 tablet 0   pravastatin (PRAVACHOL) 40 MG tablet TAKE 1 TABLET(40 MG) BY MOUTH DAILY 90 tablet 2   sulfaSALAzine (AZULFIDINE) 500 MG tablet Take 2 tablets (1,000 mg total) by mouth 2 (two) times daily. Start with 1 tablet each dose then increase to 1 tablet and 2 tablet each day then 2 tablets twice daily, increase each week (Patient taking differently: Take 1,000 mg by mouth daily.) 120 tablet 1   No current facility-administered medications on file prior to visit.     Review of Systems  Constitutional:  Negative for fever.  Respiratory:  Negative for cough, shortness of breath and wheezing.   Cardiovascular:  Negative for chest pain, palpitations and leg swelling.  Gastrointestinal:  Negative for abdominal pain.       Occ  gerd   Musculoskeletal:  Positive for arthralgias (ankles, knees, hips, shoulder, hands).  Neurological:  Negative for light-headedness and headaches.       Objective:   Vitals:   01/04/23 1000  BP: (!) 140/82  Pulse: 62  Temp: 98.5 F (36.9 C)  SpO2: 97%   BP Readings from Last 3 Encounters:  01/04/23 (!) 140/82  12/02/22 (!) 173/84  10/05/22 132/85   Wt Readings from Last 3 Encounters:  01/04/23 112 lb (50.8 kg)  12/02/22 114 lb 3.2 oz (51.8 kg)  11/26/22 108 lb (49 kg)   Body mass index is 22.62 kg/m.    Physical Exam Constitutional:      General: She is not in acute distress.    Appearance: Normal appearance.  HENT:     Head: Normocephalic and atraumatic.  Eyes:     Conjunctiva/sclera: Conjunctivae normal.  Cardiovascular:     Rate and Rhythm: Normal rate and regular rhythm.     Heart sounds: Normal heart sounds.  Pulmonary:     Effort: Pulmonary effort is normal. No respiratory distress.     Breath sounds: Normal breath sounds. No wheezing.  Musculoskeletal:     Cervical back: Neck supple.     Right lower leg: No edema.     Left lower leg: No edema.  Lymphadenopathy:  Cervical: No cervical adenopathy.  Skin:    General: Skin is warm and dry.     Findings: No rash.  Neurological:     Mental Status: She is alert. Mental status is at baseline.  Psychiatric:        Mood and Affect: Mood normal.        Behavior: Behavior normal.        Lab Results  Component Value Date   WBC 6.9 12/02/2022   HGB 13.0 12/02/2022   HCT 38.9 12/02/2022   PLT 241 12/02/2022   GLUCOSE 92 12/02/2022   CHOL 200 12/26/2021   TRIG 70.0 12/26/2021   HDL 94.10 12/26/2021   LDLDIRECT 87.0 09/16/2012   LDLCALC 92 12/26/2021   ALT 13 12/02/2022   AST 20 12/02/2022   NA 134 (L) 12/02/2022   K 3.9 12/02/2022   CL 96 (L) 12/02/2022   CREATININE 0.60 12/02/2022   BUN 7 12/02/2022   CO2 29 12/02/2022   TSH 1.10 04/14/2022   INR 1.09 05/19/2012   HGBA1C 5.1 06/26/2021      Assessment & Plan:    See Problem List for Assessment and Plan of chronic medical problems.

## 2023-01-03 NOTE — Patient Instructions (Addendum)
      Monitor your BP at home.    Medications changes include :   none      Return in about 6 months (around 07/06/2023) for follow up.

## 2023-01-04 ENCOUNTER — Ambulatory Visit (INDEPENDENT_AMBULATORY_CARE_PROVIDER_SITE_OTHER): Payer: Medicare Other | Admitting: Internal Medicine

## 2023-01-04 ENCOUNTER — Other Ambulatory Visit: Payer: Self-pay | Admitting: Internal Medicine

## 2023-01-04 ENCOUNTER — Encounter: Payer: Self-pay | Admitting: Internal Medicine

## 2023-01-04 VITALS — BP 136/74 | HR 62 | Temp 98.5°F | Ht 59.0 in | Wt 112.0 lb

## 2023-01-04 DIAGNOSIS — E785 Hyperlipidemia, unspecified: Secondary | ICD-10-CM | POA: Diagnosis not present

## 2023-01-04 DIAGNOSIS — M810 Age-related osteoporosis without current pathological fracture: Secondary | ICD-10-CM

## 2023-01-04 DIAGNOSIS — F411 Generalized anxiety disorder: Secondary | ICD-10-CM

## 2023-01-04 DIAGNOSIS — M02361 Reiter's disease, right knee: Secondary | ICD-10-CM

## 2023-01-04 DIAGNOSIS — R233 Spontaneous ecchymoses: Secondary | ICD-10-CM | POA: Diagnosis not present

## 2023-01-04 DIAGNOSIS — I10 Essential (primary) hypertension: Secondary | ICD-10-CM

## 2023-01-04 DIAGNOSIS — R350 Frequency of micturition: Secondary | ICD-10-CM | POA: Diagnosis not present

## 2023-01-04 LAB — URINALYSIS, ROUTINE W REFLEX MICROSCOPIC
Bilirubin Urine: NEGATIVE
Hgb urine dipstick: NEGATIVE
Ketones, ur: NEGATIVE
Nitrite: NEGATIVE
Specific Gravity, Urine: 1.01 (ref 1.000–1.030)
Total Protein, Urine: NEGATIVE
Urine Glucose: NEGATIVE
Urobilinogen, UA: 0.2 (ref 0.0–1.0)
pH: 7 (ref 5.0–8.0)

## 2023-01-04 NOTE — Telephone Encounter (Signed)
Last Fill: 10/05/2022  Labs: 12/02/2022 sodium 134, chloride 96  Next Visit: 03/04/2023  Last Visit: 12/02/2022  DX: Reactive arthritis of right knee   Current Dose per office note on 12/02/2022: Recommend continuing the sulfasalazine 500 mg twice daily.   Attempted to contact patient to clarify dose. Patient's husband stated she was unavailable but will return the call.

## 2023-01-04 NOTE — Assessment & Plan Note (Signed)
Chronic Check lipid panel  Continue pravastatin 40 mg daily Regular exercise and healthy diet encouraged  

## 2023-01-04 NOTE — Assessment & Plan Note (Signed)
New Has noticed a rash on bilateral anterior lower legs-small red dots, nonblanching-possible petechiae-?  Side effect from sulfasalazine Check routine blood work She will monitor

## 2023-01-04 NOTE — Telephone Encounter (Signed)
Patient is taking 1 in the morning and 1 in the evening.

## 2023-01-04 NOTE — Assessment & Plan Note (Signed)
Chronic Controlled, stable Continue xanax 0.25 mg bid prn-takes infrequently  

## 2023-01-04 NOTE — Assessment & Plan Note (Signed)
Chronic Blood pressure controlled Continue amlodipine 10 mg daily, hctz 12.5 mg daily CMP from last month reviewed-normal GFR, potassium

## 2023-01-04 NOTE — Assessment & Plan Note (Addendum)
Chronic DEXA up-to-date and monitored by gynecologist Not currently on any medication Stress regular exercise Restart calcium and vitamin D supplementation

## 2023-01-05 ENCOUNTER — Telehealth: Payer: Self-pay | Admitting: Internal Medicine

## 2023-01-05 ENCOUNTER — Encounter: Payer: Self-pay | Admitting: Internal Medicine

## 2023-01-05 NOTE — Telephone Encounter (Signed)
Pt called in wanted to know her results. Please call pt with update.

## 2023-01-05 NOTE — Telephone Encounter (Signed)
Okay to refill SSZ?

## 2023-01-06 NOTE — Telephone Encounter (Signed)
Patient had also sent my-chart message so I responded back to her.

## 2023-01-06 NOTE — Telephone Encounter (Signed)
UA with ? Infection --- would ideally like to wait for culture to return unless she is having bad symptoms

## 2023-01-08 LAB — URINE CULTURE

## 2023-01-08 MED ORDER — NITROFURANTOIN MONOHYD MACRO 100 MG PO CAPS: 100.0000 mg | ORAL_CAPSULE | Freq: Two times a day (BID) | ORAL | 0 refills | Status: DC

## 2023-01-08 NOTE — Addendum Note (Signed)
Addended by: Pincus Sanes on: 01/08/2023 11:01 AM   Modules accepted: Orders

## 2023-02-05 ENCOUNTER — Encounter: Payer: Self-pay | Admitting: Internal Medicine

## 2023-02-26 NOTE — Progress Notes (Unsigned)
Office Visit Note  Patient: [name]             Date of Birth: Aug 15, 1951           MRN: [id number]             PCP: Pincus Sanes, MD Referring: Pincus Sanes, MD Visit Date: 03/04/2023   Subjective:  No chief complaint on file.   History of Present Illness: [name] is a 72 y.o. female here for follow up for inflammatory arthritis suspected seronegative arthritis on sulfasalazine 500 mg twice daily.   Previous HPI 12/02/2022 [name] is a 72 y.o. female here for follow up for inflammatory arthritis suspected seronegative arthritis.  Since last visit she started the sulfasalazine and completed prednisone taper.  She did not tolerate titration to greater than 500 mg twice daily dose due to noticing headaches and GI irritation with some abdominal pain and nausea.  Joint symptoms are partially improved she is not having large amounts of visible swelling.  But still getting body pain pretty diffusely still worst is at the knees and having symptoms about 3 to 4 days out of the week.   Previous HPI 10/05/22 [name] is a 72 y.o. female here for follow up for inflammatory arthritis suspect reactive arthritis or seronegative arthritis. Since finishing the prednisone treatment her symptoms worsened after about 1 week later. Pretty much hurting diffusely, gets stiff all over and hard to get up and move sometimes.. Her knees have become swollen again with slightly reduced ROM.    Previous HPI 06/29/22 [name] is a 72 y.o. female here for follow up for lower extremity pain and joint swelling on prednisone tapered after last visit form 20 mg daily down to 5 mg. She notices an increase in symptoms describes a sensation of pressure of squeezing on her low back and legs. Also developed a tender nodule in the left buttock and subsequent overlying bruising. Knee pains without erythema, left knee bakers cyst feels smaller. She still feels sensation on swelling or pillow  sensation on sole of the foot.   Previous HPI 06/05/2022 [name] is a 72 y.o. female here for evaluation of lower extremity joint pain and swelling, weakness, and elevated inflammatory markers. Symptoms started around the end of July this year. Preceding onset of back and knee pains she was treated with ciprofloxacin for UTI with pseudomonas and staph equorum on culture. A week later [name] ED visit with CT scan of the abdomen demonstrated probable gastroenteritis, at the time with constipation and abdominal pain. Never reported any blood diarrhea and no specific infection identified thought likely viral. She developed knee pain and decreased mobility several days later. Initial treatments with gabapentin, flexeril, and prednisone were helpful. Right knee xray showed mild osteoarthritis and trace effusion otherwise unremarkable. She was found to have elevated sedimentation rate, mild anemia with increased platelets, mild hypogammaglobulinemia of IgA, and low thiamine. She was also started on supplementation for this and saw Dr. Terrace Arabia with neurology. Neurologic exam was intact, MRI of cervical spine obtained showing multilevel degenerative changes without significant impingement in evidence. Persistent gait difficulty and was started on prednisone 20 mg daily with PCP office for concern of possible PMR and some symptom improvement. Nodule or swelling in the posterior left knee and in left heel also seen with PCP f/u.  Today she reports feeling pretty well still has some symptoms at the left knee but mostly mild  bigger complaint is the posterior nodule or swelling. She is not experiencing any visual changes or eye pain.  No noticeable lymphadenopathy.  Urinary symptoms have remained improved after treatment of the UTI.  She was not tolerating good oral intake during July and August with all of the symptoms going on but this is now improved.   Labs reviewed 05/2022 ANA neg ESR 87 CRP 3.3 BMP  wnl Vit B1 45   04/2022 CK 17 Ferritin 350 Iron 5 TIBC 211 Sat 2% SPEP- acute phase reaction proteins BMP Na 128 K 3.2 Cl 86 Lyme WB neg   03/2022 ANA neg CCP neg RF neg ESR 37 CRP 11.4 BMP Na 128 K 3.0 Cl 85     No Rheumatology ROS completed.   PMFS History:  Patient Active Problem List   Diagnosis Date Noted   Petechial rash 01/04/2023   High risk medication use 10/05/2022   Macular degeneration, early 07/10/2022   Palpitations 07/03/2022   Baker cyst, left 06/05/2022   Chronic low back pain 06/05/2022   Arthralgia 05/20/2022   Gait abnormality 05/14/2022   Thiamine deficiency 05/14/2022   Reactive arthritis of right knee (HCC) 04/14/2022   Essential thrombocytosis (HCC) 04/14/2022   Aortic atherosclerosis (HCC) 04/06/2022   Myalgia 04/06/2022   Fatigue 04/06/2022   Arthralgia of both knees 04/06/2022   Salivary gland swelling 12/02/2020   Fall 02/13/2020   Left arm pain 02/13/2020   Hyperglycemia 06/07/2019   DDD (degenerative disc disease), cervical 02/02/2019   Musculoskeletal neck pain 10/14/2018   Change in bowel function 08/11/2018   Lower abdominal pain 08/11/2018   Diverticulitis 08/11/2018   Shoulder pain, left 02/23/2017   B12 deficiency 08/07/2016   Recurrent UTI 11/08/2015   Non-celiac gluten sensitivity    Anxiety state 01/17/2010   ALLERGIC RHINITIS 01/17/2010   Dyslipidemia 07/12/2009   Essential hypertension 07/12/2009   Osteoporosis 06/22/2008   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   ANEMIA, PERNICIOUS 10/14/2007   IRRITABLE BOWEL SYNDROME 10/14/2007    Past Medical History:  Diagnosis Date   ALLERGIC RHINITIS    ANEMIA, PERNICIOUS    ANXIETY    BILIARY DYSKINESIA    CARPAL TUNNEL SYNDROME, LEFT    DYSLIPIDEMIA    Headache(784.0)    HYPERTENSION    Irritable bowel syndrome    Non-celiac gluten sensitivity    OSTEOPENIA    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HCC)    borderline    Family History  Problem Relation  Age of Onset   Colitis Mother    Heart disease Mother    Hyperlipidemia Mother    Lung cancer Mother        Cause of death   Hypertension Mother    Lung cancer Father    Heart disease Brother    Heart attack Brother    Heart disease Brother    Valvular heart disease Brother    Heart attack Brother    Atrial fibrillation Brother    Heart failure Brother    Miscarriages / Stillbirths Brother    Valvular heart disease Brother    Leukemia Brother    Coronary artery disease Brother        MI in late 3's. Still living   Coronary artery disease Brother        MI in 39's. Also still living.   Ovarian cancer Maternal Aunt    Diabetes Maternal Aunt    Colon cancer Neg Hx    Colon polyps Neg Hx    Past  Surgical History:  Procedure Laterality Date   APPENDECTOMY     CARPAL TUNNEL RELEASE     CESAREAN SECTION  76 & 85   x's 2    CHOLECYSTECTOMY  2005   Dr. Derrell Lolling (Gallbladder dyskinesia)   COLONOSCOPY     DILATION AND CURETTAGE OF UTERUS     LEFT HEART CATHETERIZATION WITH CORONARY ANGIOGRAM N/A 05/19/2012   Procedure: LEFT HEART CATHETERIZATION WITH CORONARY ANGIOGRAM;  Surgeon: Herby Abraham, MD;  Location: [facility] CATH LAB;  Service: Cardiovascular;  Laterality: N/A;   ROTATOR CUFF REPAIR     TONSILLECTOMY     UPPER GASTROINTESTINAL ENDOSCOPY     Social History   Social History Narrative   Married and lives with husband.    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 History  Administered Date(s) Administered   Fluad Quad(high Dose 65+) 06/08/2019, 06/27/2020, 07/03/2022   Influenza Split 06/17/2012   Influenza, High Dose Seasonal PF 06/06/2018   Influenza,inj,Quad PF,6+ Mos 07/10/2013, 08/16/2014, 08/07/2016, 08/11/2017   PFIZER Comirnaty(Gray Top)Covid-19 Tri-Sucrose Vaccine 12/14/2020   PFIZER(Purple Top)SARS-COV-2 Vaccination 10/02/2019, 10/23/2019, 06/19/2020, 12/13/2020   Pneumococcal Conjugate-13 06/08/2019   Pneumococcal Polysaccharide-23 06/27/2020   Td  09/08/2003   Tdap 11/08/2015   Zoster Recombinat (Shingrix) 06/18/2017, 10/06/2017     Objective: Vital Signs: There were no vitals taken for this visit.   Physical Exam   Musculoskeletal Exam: ***  CDAI Exam: CDAI Score: -- Patient Global: --; Provider Global: -- Swollen: --; Tender: -- Joint Exam 03/04/2023   No joint exam has been documented for this visit   There is currently no information documented on the homunculus. Go to the Rheumatology activity and complete the homunculus joint exam.  Investigation: No additional findings.  Imaging: No results found.  Recent Labs: Lab Results  Component Value Date   WBC 6.9 12/02/2022   HGB 13.0 12/02/2022   PLT 241 12/02/2022   NA 134 (L) 12/02/2022   K 3.9 12/02/2022   CL 96 (L) 12/02/2022   CO2 29 12/02/2022   GLUCOSE 92 12/02/2022   BUN 7 12/02/2022   CREATININE 0.60 12/02/2022   BILITOT 0.5 12/02/2022   ALKPHOS 127 (H) 05/20/2022   AST 20 12/02/2022   ALT 13 12/02/2022   PROT 7.0 12/02/2022   ALBUMIN 4.1 05/20/2022   CALCIUM 10.0 12/02/2022   GFRAA >60 08/14/2018    Speciality Comments: No specialty comments available.  Procedures:  No procedures performed Allergies: Fosamax [alendronate sodium], Hydrocodone, Hydrocodone-acetaminophen, Hydrocodone-acetaminophen, Oxycodone-acetaminophen, Oxycodone-acetaminophen, and Tizanidine   Assessment / Plan:     Visit Diagnoses: No diagnosis found.  ***  Orders: No orders of the defined types were placed in this encounter.  No orders of the defined types were placed in this encounter.    Follow-Up Instructions: No follow-ups on file.   Ellen Henri, CMA  Note - This record has been created using Animal nutritionist.  Chart creation errors have been sought, but may not always  have been located. Such creation errors do not reflect on  the standard of medical care.

## 2023-03-04 ENCOUNTER — Encounter: Payer: Self-pay | Admitting: Internal Medicine

## 2023-03-04 ENCOUNTER — Ambulatory Visit: Payer: Medicare Other | Attending: Internal Medicine | Admitting: Internal Medicine

## 2023-03-04 VITALS — BP 172/75 | HR 68 | Resp 12 | Ht 59.0 in | Wt 113.0 lb

## 2023-03-04 DIAGNOSIS — D802 Selective deficiency of immunoglobulin A [IgA]: Secondary | ICD-10-CM

## 2023-03-04 DIAGNOSIS — Z79899 Other long term (current) drug therapy: Secondary | ICD-10-CM | POA: Diagnosis not present

## 2023-03-04 DIAGNOSIS — M02361 Reiter's disease, right knee: Secondary | ICD-10-CM | POA: Diagnosis not present

## 2023-03-04 LAB — CBC WITH DIFFERENTIAL/PLATELET
Absolute Monocytes: 478 cells/uL (ref 200–950)
Basophils Absolute: 30 cells/uL (ref 0–200)
Basophils Relative: 0.5 %
Eosinophils Relative: 0.8 %
MCV: 91.3 fL (ref 80.0–100.0)
MPV: 10.5 fL (ref 7.5–12.5)
Neutro Abs: 4407 cells/uL (ref 1500–7800)
Total Lymphocyte: 15.9 %

## 2023-03-05 LAB — COMPLETE METABOLIC PANEL WITH GFR
AG Ratio: 2.9 (calc) — ABNORMAL HIGH (ref 1.0–2.5)
ALT: 13 U/L (ref 6–29)
AST: 18 U/L (ref 10–35)
Albumin: 4.9 g/dL (ref 3.6–5.1)
Alkaline phosphatase (APISO): 80 U/L (ref 37–153)
BUN: 7 mg/dL (ref 7–25)
CO2: 25 mmol/L (ref 20–32)
Calcium: 9.9 mg/dL (ref 8.6–10.4)
Chloride: 98 mmol/L (ref 98–110)
Creat: 0.55 mg/dL (ref 0.50–1.05)
Globulin: 1.7 g/dL (calc) — ABNORMAL LOW (ref 1.9–3.7)
Glucose, Bld: 98 mg/dL (ref 65–99)
Potassium: 4.6 mmol/L (ref 3.5–5.3)
Sodium: 135 mmol/L (ref 135–146)
Total Bilirubin: 0.5 mg/dL (ref 0.2–1.2)
Total Protein: 6.6 g/dL (ref 6.1–8.1)
eGFR: 99 mL/min/{1.73_m2} (ref >=60)

## 2023-03-05 LAB — CBC WITH DIFFERENTIAL/PLATELET
Eosinophils Absolute: 47 cells/uL (ref 15–500)
HCT: 40.1 % (ref 35.0–45.0)
Hemoglobin: 13.4 g/dL (ref 11.7–15.5)
Lymphs Abs: 938 cells/uL (ref 850–3900)
MCH: 30.5 pg (ref 27.0–33.0)
MCHC: 33.4 g/dL (ref 32.0–36.0)
Monocytes Relative: 8.1 %
Neutrophils Relative %: 74.7 %
Platelets: 211 10*3/uL (ref 140–400)
RBC: 4.39 10*6/uL (ref 3.80–5.10)
RDW: 13.3 % (ref 11.0–15.0)
WBC: 5.9 10*3/uL (ref 3.8–10.8)

## 2023-03-05 LAB — SEDIMENTATION RATE: Sed Rate: 2 mm/h (ref 0–30)

## 2023-03-05 NOTE — Progress Notes (Signed)
Lab results look fine. I think she can safely try stopping sulfasalazine. I recommend decreasing the dose by half for a week to monitor any difference before stopping completely.

## 2023-03-09 DIAGNOSIS — Z124 Encounter for screening for malignant neoplasm of cervix: Secondary | ICD-10-CM | POA: Diagnosis not present

## 2023-03-09 DIAGNOSIS — Z6823 Body mass index (BMI) 23.0-23.9, adult: Secondary | ICD-10-CM | POA: Diagnosis not present

## 2023-03-09 DIAGNOSIS — M816 Localized osteoporosis [Lequesne]: Secondary | ICD-10-CM | POA: Diagnosis not present

## 2023-03-09 DIAGNOSIS — N958 Other specified menopausal and perimenopausal disorders: Secondary | ICD-10-CM | POA: Diagnosis not present

## 2023-03-09 DIAGNOSIS — Z1231 Encounter for screening mammogram for malignant neoplasm of breast: Secondary | ICD-10-CM | POA: Diagnosis not present

## 2023-03-09 LAB — HM DEXA SCAN

## 2023-03-09 LAB — HM MAMMOGRAPHY

## 2023-04-05 ENCOUNTER — Other Ambulatory Visit: Payer: Self-pay | Admitting: Internal Medicine

## 2023-04-09 ENCOUNTER — Encounter: Payer: Self-pay | Admitting: Internal Medicine

## 2023-04-09 ENCOUNTER — Ambulatory Visit (INDEPENDENT_AMBULATORY_CARE_PROVIDER_SITE_OTHER): Payer: Medicare Other | Admitting: Internal Medicine

## 2023-04-09 VITALS — BP 136/80 | HR 80 | Temp 98.3°F | Ht 59.0 in | Wt 111.8 lb

## 2023-04-09 DIAGNOSIS — I10 Essential (primary) hypertension: Secondary | ICD-10-CM | POA: Diagnosis not present

## 2023-04-09 DIAGNOSIS — M25511 Pain in right shoulder: Secondary | ICD-10-CM | POA: Diagnosis not present

## 2023-04-09 NOTE — Progress Notes (Signed)
    Subjective:    Patient ID: [name], female    DOB: 1950/11/29, 72 y.o.   MRN: [id number]      HPI [name] is here for  Chief Complaint  Patient presents with   Shoulder Pain    Right shoulder pain    5-6 weeks ago she was in the pool and hit a light ball and felt something in the shoulder.  There was slight achiness since then that has gotten worse.  The front and side of her arm aches at times.  She has decreased ROM reaching backwards. She does have pain with extreme ranges of motion.  No obvious shoulder swelling.    She has h/o of a rotator cuff tear in her right shoulder years ago and had it repaired.       Medications and allergies reviewed with patient and updated if appropriate.  Current Outpatient Medications on File Prior to Visit  Medication Sig Dispense Refill   ALPRAZolam (XANAX) 0.25 MG tablet TAKE 1 TABLET(0.25 MG) BY MOUTH TWICE DAILY AS NEEDED FOR ANXIETY 60 tablet 0   amLODipine (NORVASC) 10 MG tablet TAKE 1 TABLET(10 MG) BY MOUTH DAILY 90 tablet 2   CALCIUM CITRATE PO Take by mouth.     cetirizine (ZYRTEC) 10 MG tablet Take 10 mg by mouth daily.     Cholecalciferol (VITAMIN D-3 PO) Take by mouth.     hydrochlorothiazide (HYDRODIURIL) 12.5 MG tablet Take 1 tablet (12.5 mg total) by mouth daily. 90 tablet 3   pravastatin (PRAVACHOL) 40 MG tablet TAKE 1 TABLET(40 MG) BY MOUTH DAILY 90 tablet 2   No current facility-administered medications on file prior to visit.    Review of Systems     Objective:   Vitals:   04/09/23 0953  BP: 136/80  Pulse: 80  Temp: 98.3 F (36.8 C)  SpO2: 99%   BP Readings from Last 3 Encounters:  04/09/23 136/80  03/04/23 (!) 172/75  01/04/23 136/74   Wt Readings from Last 3 Encounters:  04/09/23 111 lb 12.8 oz (50.7 kg)  03/04/23 113 lb (51.3 kg)  01/04/23 112 lb (50.8 kg)   Body mass index is 22.58 kg/m.    Physical Exam    A Right Shoulder exam was performed.   SWELLING: none  EFFUSION: no   WARMTH: no warmth  TENDERNESS: Mild tenderness with palpation of the anterior shoulder ROM: Minimally decreased ROM with pain at extremes of movement, especially reaching to the neck NEUROLOGICAL EXAM: normal sensation and strength  PULSES: normal       Assessment & Plan:    See Problem List for Assessment and Plan of chronic medical problems.

## 2023-04-09 NOTE — Assessment & Plan Note (Signed)
Chronic Blood pressure controlled Continue amlodipine 10 mg daily, hctz 12.5 mg daily

## 2023-04-09 NOTE — Assessment & Plan Note (Signed)
Acute Injured about 4-5 weeks hitting a light ball Concern for rotator cuff tendinitis, small tear Will refer to sports medicine Advised topical medications, tylenol as needed

## 2023-04-09 NOTE — Patient Instructions (Addendum)
      Medications changes include :   none    A referral was ordered sports medicine and someone will call you to schedule an appointment.     Return if symptoms worsen or fail to improve.

## 2023-04-13 ENCOUNTER — Ambulatory Visit: Payer: Medicare Other | Admitting: Sports Medicine

## 2023-04-14 NOTE — Progress Notes (Unsigned)
    Aleen Sells D.[facility] [address] [place] [address] Phone: [phone]   Assessment and Plan:     There are no diagnoses linked to this encounter.  ***   Pertinent previous records reviewed include ***   Follow Up: ***     Subjective:   I,  , am serving as a Neurosurgeon for Doctor Richardean Sale  Chief Complaint: right shoulder pain   HPI:  04/15/2023 Patient is a 72 year old female complaining of right shoulder pain. Patient states   Relevant Historical Information: ***  Additional pertinent review of systems negative.   Current Outpatient Medications:    ALPRAZolam (XANAX) 0.25 MG tablet, TAKE 1 TABLET(0.25 MG) BY MOUTH TWICE DAILY AS NEEDED FOR ANXIETY, Disp: 60 tablet, Rfl: 0   amLODipine (NORVASC) 10 MG tablet, TAKE 1 TABLET(10 MG) BY MOUTH DAILY, Disp: 90 tablet, Rfl: 2   CALCIUM CITRATE PO, Take by mouth., Disp: , Rfl:    cetirizine (ZYRTEC) 10 MG tablet, Take 10 mg by mouth daily., Disp: , Rfl:    Cholecalciferol (VITAMIN D-3 PO), Take by mouth., Disp: , Rfl:    hydrochlorothiazide (HYDRODIURIL) 12.5 MG tablet, Take 1 tablet (12.5 mg total) by mouth daily., Disp: 90 tablet, Rfl: 3   pravastatin (PRAVACHOL) 40 MG tablet, TAKE 1 TABLET(40 MG) BY MOUTH DAILY, Disp: 90 tablet, Rfl: 2   Objective:     There were no vitals filed for this visit.    There is no height or weight on file to calculate BMI.    Physical Exam:    ***   Electronically signed by:  Aleen Sells [facility] 4:17 PM 04/14/23

## 2023-04-15 ENCOUNTER — Ambulatory Visit (INDEPENDENT_AMBULATORY_CARE_PROVIDER_SITE_OTHER): Payer: Medicare Other

## 2023-04-15 ENCOUNTER — Ambulatory Visit (INDEPENDENT_AMBULATORY_CARE_PROVIDER_SITE_OTHER): Payer: Medicare Other | Admitting: Sports Medicine

## 2023-04-15 VITALS — BP 108/72 | HR 77 | Ht 59.0 in | Wt 113.0 lb

## 2023-04-15 DIAGNOSIS — M25511 Pain in right shoulder: Secondary | ICD-10-CM

## 2023-04-15 DIAGNOSIS — M19011 Primary osteoarthritis, right shoulder: Secondary | ICD-10-CM | POA: Diagnosis not present

## 2023-04-15 DIAGNOSIS — G8929 Other chronic pain: Secondary | ICD-10-CM

## 2023-04-15 MED ORDER — MELOXICAM 7.5 MG PO TABS: 7.5000 mg | ORAL_TABLET | Freq: Every day | ORAL | 0 refills | Status: DC

## 2023-04-15 NOTE — Patient Instructions (Addendum)
-   Start meloxicam 7.5 mg daily x2 weeks.  If still having pain after 2 weeks, complete 3rd-week of meloxicam. May use remaining meloxicam as needed once daily for pain control.  Do not to use additional NSAIDs while taking meloxicam.  May use Tylenol [phone] mg 2 to 3 times a day for breakthrough pain. Shoulder HEP  4 week follow up

## 2023-05-11 NOTE — Progress Notes (Signed)
[name] D.[facility] [address] [place] [address] Phone: [phone]   Assessment and Plan:     1. Chronic right shoulder pain -Chronic with exacerbation, subsequent sports medicine visit - Still most consistent with rotator cuff strain and subacromial bursitis based on HPI and physical exam - Patient had essentially no improvement with meloxicam 7.5 mg course and intermittent HEP - Patient elected for subacromial CSI.  Tolerated well per note below - Discontinue meloxicam and may use remainder as needed - May use Tylenol for day-to-day pain relief - Continue HEP for shoulder and rotator cuff  Procedure: Subacromial Injection Side: Right  Risks explained and consent was given verbally. The site was cleaned with alcohol prep. A steroid injection was performed from posterior approach using 2mL of 1% lidocaine without epinephrine and 1mL of kenalog 40mg /ml. This was well tolerated and resulted in symptomatic relief.  Needle was removed, hemostasis achieved, and post injection instructions were explained.   Pt was advised to call or return to clinic if these symptoms worsen or fail to improve as anticipated.     Pertinent previous records reviewed include none   Follow Up: 3-4 weeks for reevaluation. Could consider PT vs [place] vs MRI     Subjective:   I, [name], am serving as a Neurosurgeon for Doctor Richardean Sale   Chief Complaint: right shoulder pain    HPI:  04/15/2023 Patient is a 72 year old female complaining of right shoulder pain. Patient states 5-6 weeks ago she was in the pool and hit a ball and felt something in the shoulder.  Tylenol and ibu for the pain and that seemed to help some. Pain radiates down the arm and to the neck . Denies any numbness and tingling.There was slight achiness since then that has gotten worse.  The front and side of her arm aches at times.  She has decreased ROM due to pain .  She has h/o of a  rotator cuff tear in her right shoulder years ago and had it repaired.   Decreases in grip strength. Does have a hx of carpal tunnel   05/12/2023 Patient states that she is the same     Relevant Historical Information: Hypertension, osteoporosis  Additional pertinent review of systems negative.   Current Outpatient Medications:    ALPRAZolam (XANAX) 0.25 MG tablet, TAKE 1 TABLET(0.25 MG) BY MOUTH TWICE DAILY AS NEEDED FOR ANXIETY, Disp: 60 tablet, Rfl: 0   amLODipine (NORVASC) 10 MG tablet, TAKE 1 TABLET(10 MG) BY MOUTH DAILY, Disp: 90 tablet, Rfl: 2   CALCIUM CITRATE PO, Take by mouth., Disp: , Rfl:    cetirizine (ZYRTEC) 10 MG tablet, Take 10 mg by mouth daily., Disp: , Rfl:    Cholecalciferol (VITAMIN D-3 PO), Take by mouth., Disp: , Rfl:    hydrochlorothiazide (HYDRODIURIL) 12.5 MG tablet, Take 1 tablet (12.5 mg total) by mouth daily., Disp: 90 tablet, Rfl: 3   meloxicam (MOBIC) 7.5 MG tablet, Take 1 tablet (7.5 mg total) by mouth daily., Disp: 30 tablet, Rfl: 0   pravastatin (PRAVACHOL) 40 MG tablet, TAKE 1 TABLET(40 MG) BY MOUTH DAILY, Disp: 90 tablet, Rfl: 2   Objective:     Vitals:   05/12/23 1400  BP: 118/80  Pulse: 68  SpO2: 98%  Weight: 113 lb (51.3 kg)  Height: 4\' 11"  (1.499 m)      Body mass index is 22.82 kg/m.    Physical Exam:    Gen:  Appears well, nad, nontoxic and pleasant Neuro:sensation intact, strength is 5/5 with df/pf/inv/ev, muscle tone wnl Skin: no suspicious lesion or defmority Psych: A&O, appropriate mood and affect   Right shoulder:  No deformity, swelling or muscle wasting No scapular winging FF 170 with painful arc, abd 180 with painful arc, int 15 with painful arc, ext 90 TTP deltoid, biceps groove NTTP over the [place], clavicle, ac, coracoid,   humerus,  trapezius, cervical spine Positive neer, hawkins, empty can, obriens, crossarm, subscap liftoff, speeds Neg ant drawer, sulcus sign, apprehension Negative Spurling's test bilat FROM of neck      Electronically signed by:  [name] [facility] 4:03 PM 05/12/23

## 2023-05-12 ENCOUNTER — Ambulatory Visit (INDEPENDENT_AMBULATORY_CARE_PROVIDER_SITE_OTHER): Payer: Medicare Other | Admitting: Sports Medicine

## 2023-05-12 VITALS — BP 118/80 | HR 68 | Ht 59.0 in | Wt 113.0 lb

## 2023-05-12 DIAGNOSIS — G8929 Other chronic pain: Secondary | ICD-10-CM | POA: Diagnosis not present

## 2023-05-12 DIAGNOSIS — M25511 Pain in right shoulder: Secondary | ICD-10-CM | POA: Diagnosis not present

## 2023-05-19 ENCOUNTER — Encounter: Payer: Self-pay | Admitting: Internal Medicine

## 2023-05-20 ENCOUNTER — Telehealth: Payer: Self-pay | Admitting: Internal Medicine

## 2023-05-20 NOTE — Telephone Encounter (Signed)
Patient called stating she is experiencing pain and swelling in her Jaw on the left side.  Patient states it began hurting last week and has gotten worse to the point where she is having difficulty swallowing.  Patient plans to call her PCP Dr. Lawerance Bach this morning, but also wanted to call Dr. Dimple Casey to see if this could be related to her arthritis.

## 2023-05-20 NOTE — Telephone Encounter (Signed)
Patient is currently not prescribed medication by our office. Please advise.

## 2023-05-20 NOTE — Progress Notes (Signed)
Subjective:    Patient ID: [name], female    DOB: 1951/08/12, 72 y.o.   MRN: [id number]      HPI Mariell is here for  Chief Complaint  Patient presents with   Facial Swelling    Pain on left side of neck with swallowing    Intermittent swelling and tenderness left side of neck.  She has had it intermittently over the years but often resolves on its own.  Her symptoms have improved, but she still has a little tenderness in the left ear and a little tenderness, feeling of swelling on the left side of her neck.  Last week she felt the swelling and discomfort more on the left side of her neck and it felt almost hard or painful to swallow.  She did have a sore throat which is felt like the left side of her neck was swollen.     Felt dizzy off and on recently.  Occ shrap needle twinge of pain in the left neck.   Something similar 11/2020 - ? Salivary gland swelling - infection.  Improved with abx - keflex tid x 10 d   Medications and allergies reviewed with patient and updated if appropriate.  Current Outpatient Medications on File Prior to Visit  Medication Sig Dispense Refill   ALPRAZolam (XANAX) 0.25 MG tablet TAKE 1 TABLET(0.25 MG) BY MOUTH TWICE DAILY AS NEEDED FOR ANXIETY 60 tablet 0   amLODipine (NORVASC) 10 MG tablet TAKE 1 TABLET(10 MG) BY MOUTH DAILY 90 tablet 2   CALCIUM CITRATE PO Take by mouth.     cetirizine (ZYRTEC) 10 MG tablet Take 10 mg by mouth daily.     Cholecalciferol (VITAMIN D-3 PO) Take by mouth.     hydrochlorothiazide (HYDRODIURIL) 12.5 MG tablet Take 1 tablet (12.5 mg total) by mouth daily. 90 tablet 3   meloxicam (MOBIC) 7.5 MG tablet Take 1 tablet (7.5 mg total) by mouth daily. 30 tablet 0   pravastatin (PRAVACHOL) 40 MG tablet TAKE 1 TABLET(40 MG) BY MOUTH DAILY 90 tablet 2   No current facility-administered medications on file prior to visit.    Review of Systems  Constitutional:  Negative for fever.  HENT:  Positive for ear pain (mild  in left ear). Negative for congestion, sinus pain and sore throat.        Left neck swollen  Respiratory:  Positive for cough (sometimes) and shortness of breath (sometimes). Negative for wheezing.   Neurological:  Positive for dizziness (occ after activity, daily activities) and headaches.       Objective:   Vitals:   05/21/23 1002  BP: 126/80  Pulse: 80  Temp: 98 F (36.7 C)  SpO2: 99%   BP Readings from Last 3 Encounters:  05/21/23 126/80  05/12/23 118/80  04/15/23 108/72   Wt Readings from Last 3 Encounters:  05/21/23 113 lb (51.3 kg)  05/12/23 113 lb (51.3 kg)  04/15/23 113 lb (51.3 kg)   Body mass index is 22.82 kg/m.    Physical Exam Constitutional:      General: She is not in acute distress.    Appearance: Normal appearance. She is not ill-appearing.  HENT:     Head: Normocephalic and atraumatic.     Left Ear: Tympanic membrane, ear canal and external ear normal. There is no impacted cerumen.     Mouth/Throat:     Mouth: Mucous membranes are moist.     Pharynx: No posterior oropharyngeal erythema.  Eyes:  Conjunctiva/sclera: Conjunctivae normal.  Musculoskeletal:     Cervical back: Neck supple. No rigidity or tenderness.  Lymphadenopathy:     Cervical: No cervical adenopathy.  Skin:    General: Skin is warm and dry.  Neurological:     Mental Status: She is alert.            Assessment & Plan:    See Problem List for Assessment and Plan of chronic medical problems.

## 2023-05-21 ENCOUNTER — Encounter: Payer: Self-pay | Admitting: Internal Medicine

## 2023-05-21 ENCOUNTER — Ambulatory Visit (INDEPENDENT_AMBULATORY_CARE_PROVIDER_SITE_OTHER): Payer: Medicare Other | Admitting: Internal Medicine

## 2023-05-21 VITALS — BP 126/80 | HR 80 | Temp 98.0°F | Ht 59.0 in | Wt 113.0 lb

## 2023-05-21 DIAGNOSIS — R600 Localized edema: Secondary | ICD-10-CM

## 2023-05-21 DIAGNOSIS — I10 Essential (primary) hypertension: Secondary | ICD-10-CM

## 2023-05-21 DIAGNOSIS — M542 Cervicalgia: Secondary | ICD-10-CM | POA: Diagnosis not present

## 2023-05-21 NOTE — Patient Instructions (Addendum)
? ? ? ?  ? ? ?  Medications changes include :  none  ? ? ? ? ? ?Return if symptoms worsen or fail to improve. ? ?

## 2023-05-21 NOTE — Assessment & Plan Note (Signed)
Chronic Blood pressure controlled Continue amlodipine 10 mg daily, hctz 12.5 mg daily

## 2023-05-21 NOTE — Assessment & Plan Note (Signed)
Acute Anterior left-sided neck pain associated with some swelling Symptoms have improved and almost resolved Will hold off on imaging and referral to ENT for now given that symptoms have improved and almost resolved Has had similar symptoms in the past - often resolve on their own Has f/u next month

## 2023-06-01 NOTE — Progress Notes (Unsigned)
    Aleen Sells [facility] [address] [place] [address] Phone: [phone]   Assessment and Plan:     There are no diagnoses linked to this encounter.  ***   Pertinent previous records reviewed include ***   Follow Up: ***     Subjective:   I, Saige Busby, am serving as a Neurosurgeon for Doctor Richardean Sale   Chief Complaint: right shoulder pain    HPI:  04/15/2023 Patient is a 72 year old female complaining of right shoulder pain. Patient states 5-6 weeks ago she was in the pool and hit a ball and felt something in the shoulder.  Tylenol and ibu for the pain and that seemed to help some. Pain radiates down the arm and to the neck . Denies any numbness and tingling.There was slight achiness since then that has gotten worse.  The front and side of her arm aches at times.  She has decreased ROM due to pain .  She has h/o of a rotator cuff tear in her right shoulder years ago and had it repaired.   Decreases in grip strength. Does have a hx of carpal tunnel    05/12/2023 Patient states that she is the same    06/02/2023 Patient states   Relevant Historical Information: Hypertension, osteoporosis  Additional pertinent review of systems negative.   Current Outpatient Medications:    ALPRAZolam (XANAX) 0.25 MG tablet, TAKE 1 TABLET(0.25 MG) BY MOUTH TWICE DAILY AS NEEDED FOR ANXIETY, Disp: 60 tablet, Rfl: 0   amLODipine (NORVASC) 10 MG tablet, TAKE 1 TABLET(10 MG) BY MOUTH DAILY, Disp: 90 tablet, Rfl: 2   CALCIUM CITRATE PO, Take by mouth., Disp: , Rfl:    cetirizine (ZYRTEC) 10 MG tablet, Take 10 mg by mouth daily., Disp: , Rfl:    Cholecalciferol (VITAMIN D-3 PO), Take by mouth., Disp: , Rfl:    hydrochlorothiazide (HYDRODIURIL) 12.5 MG tablet, Take 1 tablet (12.5 mg total) by mouth daily., Disp: 90 tablet, Rfl: 3   meloxicam (MOBIC) 7.5 MG tablet, Take 1 tablet (7.5 mg total) by mouth daily., Disp: 30 tablet, Rfl: 0   pravastatin  (PRAVACHOL) 40 MG tablet, TAKE 1 TABLET(40 MG) BY MOUTH DAILY, Disp: 90 tablet, Rfl: 2   Objective:     There were no vitals filed for this visit.    There is no height or weight on file to calculate BMI.    Physical Exam:    ***   Electronically signed by:  Aleen Sells [facility] 7:40 AM 06/01/23

## 2023-06-02 ENCOUNTER — Ambulatory Visit (INDEPENDENT_AMBULATORY_CARE_PROVIDER_SITE_OTHER): Payer: Medicare Other | Admitting: Sports Medicine

## 2023-06-02 VITALS — HR 69 | Ht 59.0 in | Wt 114.0 lb

## 2023-06-02 DIAGNOSIS — M25511 Pain in right shoulder: Secondary | ICD-10-CM

## 2023-06-02 DIAGNOSIS — G8929 Other chronic pain: Secondary | ICD-10-CM

## 2023-07-02 ENCOUNTER — Other Ambulatory Visit: Payer: Self-pay | Admitting: Internal Medicine

## 2023-07-05 ENCOUNTER — Encounter: Payer: Self-pay | Admitting: Internal Medicine

## 2023-07-05 NOTE — Patient Instructions (Addendum)
     Flu immunization administered today.      Blood work was ordered.   The lab is on the first floor.     Medications changes include :   none      Return in about 6 months (around 01/04/2024) for follow up.

## 2023-07-05 NOTE — Progress Notes (Unsigned)
Subjective:    Patient ID: [name], female    DOB: 10-15-1950, 72 y.o.   MRN: [id number]     HPI [name] is here for follow up of her chronic medical problems.  -DEXA -monitored by GYN - up to date - stable  [facility].    Medications and allergies reviewed with patient and updated if appropriate.  Current Outpatient Medications on File Prior to Visit  Medication Sig Dispense Refill   amLODipine (NORVASC) 10 MG tablet TAKE 1 TABLET(10 MG) BY MOUTH DAILY 90 tablet 2   CALCIUM CITRATE PO Take by mouth.     cetirizine (ZYRTEC) 10 MG tablet Take 10 mg by mouth daily.     Cholecalciferol (VITAMIN D-3 PO) Take by mouth.     hydrochlorothiazide (HYDRODIURIL) 12.5 MG tablet TAKE 1 TABLET(12.5 MG) BY MOUTH DAILY 90 tablet 3   meloxicam (MOBIC) 7.5 MG tablet Take 1 tablet (7.5 mg total) by mouth daily. 30 tablet 0   pravastatin (PRAVACHOL) 40 MG tablet TAKE 1 TABLET(40 MG) BY MOUTH DAILY 90 tablet 2   No current facility-administered medications on file prior to visit.     Review of Systems  Constitutional:  Negative for fever.  Respiratory:  Positive for shortness of breath (occ). Negative for cough and wheezing.   Cardiovascular:  Positive for palpitations (occ) and leg swelling (occasional). Negative for chest pain.  Musculoskeletal:  Positive for arthralgias. Negative for joint swelling.  Neurological:  Positive for light-headedness (occ) and headaches.       Objective:   Vitals:   07/06/23 0933  BP: 110/80  Pulse: 78  Temp: 98 F (36.7 C)  SpO2: 98%   BP Readings from Last 3 Encounters:  07/06/23 110/80  05/21/23 126/80  05/12/23 118/80   Wt Readings from Last 3 Encounters:  07/06/23 116 lb (52.6 kg)  06/02/23 114 lb (51.7 kg)  05/21/23 113 lb (51.3 kg)   Body mass index is 23.43 kg/m.    Physical Exam Constitutional:      General: She is not in acute distress.    Appearance: Normal appearance.  HENT:     Head: Normocephalic and  atraumatic.  Eyes:     Conjunctiva/sclera: Conjunctivae normal.  Cardiovascular:     Rate and Rhythm: Normal rate and regular rhythm.     Heart sounds: Normal heart sounds.  Pulmonary:     Effort: Pulmonary effort is normal. No respiratory distress.     Breath sounds: Normal breath sounds. No wheezing.  Musculoskeletal:     Cervical back: Neck supple.     Right lower leg: No edema.     Left lower leg: No edema.  Lymphadenopathy:     Cervical: No cervical adenopathy.  Skin:    General: Skin is warm and dry.     Findings: No rash.  Neurological:     Mental Status: She is alert. Mental status is at baseline.  Psychiatric:        Mood and Affect: Mood normal.        Behavior: Behavior normal.        Lab Results  Component Value Date   WBC 5.9 03/04/2023   HGB 13.4 03/04/2023   HCT 40.1 03/04/2023   PLT 211 03/04/2023   GLUCOSE 98 03/04/2023   CHOL 200 12/26/2021   TRIG 70.0 12/26/2021   HDL 94.10 12/26/2021   LDLDIRECT 87.0 09/16/2012   LDLCALC 92 12/26/2021   ALT 13 03/04/2023  AST 18 03/04/2023   NA 135 03/04/2023   K 4.6 03/04/2023   CL 98 03/04/2023   CREATININE 0.55 03/04/2023   BUN 7 03/04/2023   CO2 25 03/04/2023   TSH 1.10 04/14/2022   INR 1.09 05/19/2012   HGBA1C 5.1 06/26/2021     Assessment & Plan:    See Problem List for Assessment and Plan of chronic medical problems.

## 2023-07-06 ENCOUNTER — Ambulatory Visit (INDEPENDENT_AMBULATORY_CARE_PROVIDER_SITE_OTHER): Payer: Medicare Other | Admitting: Internal Medicine

## 2023-07-06 VITALS — BP 110/80 | HR 78 | Temp 98.0°F | Ht 59.0 in | Wt 116.0 lb

## 2023-07-06 DIAGNOSIS — R87619 Unspecified abnormal cytological findings in specimens from cervix uteri: Secondary | ICD-10-CM | POA: Insufficient documentation

## 2023-07-06 DIAGNOSIS — I10 Essential (primary) hypertension: Secondary | ICD-10-CM | POA: Diagnosis not present

## 2023-07-06 DIAGNOSIS — Z23 Encounter for immunization: Secondary | ICD-10-CM | POA: Diagnosis not present

## 2023-07-06 DIAGNOSIS — E785 Hyperlipidemia, unspecified: Secondary | ICD-10-CM | POA: Diagnosis not present

## 2023-07-06 DIAGNOSIS — I700 Atherosclerosis of aorta: Secondary | ICD-10-CM | POA: Diagnosis not present

## 2023-07-06 DIAGNOSIS — F411 Generalized anxiety disorder: Secondary | ICD-10-CM | POA: Diagnosis not present

## 2023-07-06 DIAGNOSIS — L309 Dermatitis, unspecified: Secondary | ICD-10-CM | POA: Insufficient documentation

## 2023-07-06 LAB — COMPREHENSIVE METABOLIC PANEL
ALT: 15 U/L (ref 0–35)
AST: 20 U/L (ref 0–37)
Albumin: 4.9 g/dL (ref 3.5–5.2)
Alkaline Phosphatase: 80 U/L (ref 39–117)
BUN: 7 mg/dL (ref 6–23)
CO2: 28 meq/L (ref 19–32)
Calcium: 10 mg/dL (ref 8.4–10.5)
Chloride: 99 meq/L (ref 96–112)
Creatinine, Ser: 0.62 mg/dL (ref 0.40–1.20)
GFR: 90.33 mL/min (ref >60.00)
Glucose, Bld: 99 mg/dL (ref 70–99)
Potassium: 3.9 meq/L (ref 3.5–5.1)
Sodium: 135 meq/L (ref 135–145)
Total Bilirubin: 0.4 mg/dL (ref 0.2–1.2)
Total Protein: 7.2 g/dL (ref 6.0–8.3)

## 2023-07-06 LAB — LIPID PANEL
Cholesterol: 191 mg/dL (ref 0–200)
HDL: 94.3 mg/dL (ref >39.00)
LDL Cholesterol: 87 mg/dL (ref 0–99)
NonHDL: 96.24
Total CHOL/HDL Ratio: 2
Triglycerides: 47 mg/dL (ref 0.0–149.0)
VLDL: 9.4 mg/dL (ref 0.0–40.0)

## 2023-07-06 MED ORDER — ALPRAZOLAM 0.25 MG PO TABS: ORAL_TABLET | ORAL | 0 refills | Status: DC

## 2023-07-06 NOTE — Assessment & Plan Note (Signed)
Chronic Continue pravastatin 40 mg daily 

## 2023-07-06 NOTE — Assessment & Plan Note (Signed)
Chronic Check lipid panel, cmp  Continue pravastatin 40 mg daily Regular exercise and healthy diet encouraged

## 2023-07-06 NOTE — Assessment & Plan Note (Addendum)
Chronic Controlled, stable Continue xanax 0.25 mg bid prn-takes infrequently Will refill today

## 2023-07-06 NOTE — Assessment & Plan Note (Signed)
Chronic Blood pressure controlled cmp Continue amlodipine 10 mg daily, hctz 12.5 mg daily

## 2023-07-12 DIAGNOSIS — Z23 Encounter for immunization: Secondary | ICD-10-CM | POA: Diagnosis not present

## 2023-08-14 ENCOUNTER — Other Ambulatory Visit: Payer: Self-pay | Admitting: Internal Medicine

## 2023-08-21 ENCOUNTER — Other Ambulatory Visit (HOSPITAL_COMMUNITY): Payer: Self-pay

## 2023-08-23 NOTE — Progress Notes (Signed)
Office Visit Note  Patient: [name]             Date of Birth: 05/23/1951           MRN: [id number]             PCP: Pincus Sanes, MD Referring: Pincus Sanes, MD Visit Date: 09/03/2023   Subjective:  Follow-up (Patient states she still has moments where it feels like her muscles do not want to work. Patient states towards the end of the day she can feel swelling around her knees, especially the right one, and sometimes they feel tight. Patient states she can get swelling under her chin and down her neck. )    Discussed the use of AI scribe software for clinical note transcription with the patient, who gave verbal consent to proceed.  History of Present Illness   [name] is a 72 y.o. female here for follow up for joint pains possible reactive arthritis versus seronegative arthritis. Symptoms remain much improved compared to last year. Still has intermittent swelling in both legs, more pronounced in the right leg. She describes a sensation of her muscles not wanting to perform as desired, which is not related to overexertion. This feeling is not localized to the legs but can affect the entire body, lasting for two to three days at a time. The patient manages these episodes by maintaining activity throughout the day, but reports feeling exhausted by the evening.  The patient also reports a recurring issue with a spot under her mandible, which she suspects may be a salivary gland. This issue arises every three to four months and is characterized by swelling that extends past the midline, down to the ear and neck. However, the patient does not find this issue consistently bothersome or significant enough to warrant medication.  The patient has been managing her symptoms with Tai Chi, a form of exercise recommended for arthritis, and walking three to five miles daily. She has also experienced episodes of bursitis, particularly in the shoulder. Steroid injection with ortho clinic  was beneficial in August.   Previous HPI 03/04/2023 [name] is a 72 y.o. female here for follow up for inflammatory arthritis possible reactive arthritis versus seronegative arthritis on sulfasalazine 500 mg twice daily.  Since her last visit she has had mild intolerance to the medication notices some increased skin and sun sensitivity.  Discussed the problem she also has skin sensitivity to most sunblock.  Had 1 urinary tract infection treated with course of Macrobid.  Since last month she also contacted [place] to update she was having more fatigue and some difficulty sleeping at night.  Was sometimes taking NSAIDs or sometimes taking gabapentin which helps with pain and knocks her out.  She has not seen the lot of joint swelling come back.  Did have some increase in some rash on her legs sounds like petechiae.   Previous HPI 12/02/2022 [name] is a 72 y.o. female here for follow up for inflammatory arthritis suspected seronegative arthritis.  Since last visit she started the sulfasalazine and completed prednisone taper.  She did not tolerate titration to greater than 500 mg twice daily dose due to noticing headaches and GI irritation with some abdominal pain and nausea.  Joint symptoms are partially improved she is not having large amounts of visible swelling.  But still getting body pain pretty diffusely still worst is at the knees and having symptoms about 3 to 4 days  out of the week.   Previous HPI 10/05/22 [name] is a 72 y.o. female here for follow up for inflammatory arthritis suspect reactive arthritis or seronegative arthritis. Since finishing the prednisone treatment her symptoms worsened after about 1 week later. Pretty much hurting diffusely, gets stiff all over and hard to get up and move sometimes.. Her knees have become swollen again with slightly reduced ROM.    Previous HPI 06/29/22 [name] is a 72 y.o. female here for follow up for lower extremity pain and  joint swelling on prednisone tapered after last visit form 20 mg daily down to 5 mg. She notices an increase in symptoms describes a sensation of pressure of squeezing on her low back and legs. Also developed a tender nodule in the left buttock and subsequent overlying bruising. Knee pains without erythema, left knee bakers cyst feels smaller. She still feels sensation on swelling or pillow sensation on sole of the foot.   Previous HPI 06/05/2022 [name] is a 72 y.o. female here for evaluation of lower extremity joint pain and swelling, weakness, and elevated inflammatory markers. Symptoms started around the end of July this year. Preceding onset of back and knee pains she was treated with ciprofloxacin for UTI with pseudomonas and staph equorum on culture. A week later [name] ED visit with CT scan of the abdomen demonstrated probable gastroenteritis, at the time with constipation and abdominal pain. Never reported any blood diarrhea and no specific infection identified thought likely viral. She developed knee pain and decreased mobility several days later. Initial treatments with gabapentin, flexeril, and prednisone were helpful. Right knee xray showed mild osteoarthritis and trace effusion otherwise unremarkable. She was found to have elevated sedimentation rate, mild anemia with increased platelets, mild hypogammaglobulinemia of IgA, and low thiamine. She was also started on supplementation for this and saw Dr. Terrace Arabia with neurology. Neurologic exam was intact, MRI of cervical spine obtained showing multilevel degenerative changes without significant impingement in evidence. Persistent gait difficulty and was started on prednisone 20 mg daily with PCP office for concern of possible PMR and some symptom improvement. Nodule or swelling in the posterior left knee and in left heel also seen with PCP f/u.  Today she reports feeling pretty well still has some symptoms at the left knee but mostly mild bigger  complaint is the posterior nodule or swelling. She is not experiencing any visual changes or eye pain.  No noticeable lymphadenopathy.  Urinary symptoms have remained improved after treatment of the UTI.  She was not tolerating good oral intake during July and August with all of the symptoms going on but this is now improved.   Labs reviewed 05/2022 ANA neg ESR 87 CRP 3.3 BMP wnl Vit B1 45   04/2022 CK 17 Ferritin 350 Iron 5 TIBC 211 Sat 2% SPEP- acute phase reaction proteins BMP Na 128 K 3.2 Cl 86 Lyme WB neg   03/2022 ANA neg CCP neg RF neg ESR 37 CRP 11.4 BMP Na 128 K 3.0 Cl 85   Review of Systems  Constitutional:  Positive for fatigue.  HENT:  Negative for mouth sores and mouth dryness.   Eyes:  Positive for dryness.  Respiratory:  Negative for shortness of breath.   Cardiovascular:  Negative for chest pain and palpitations.  Gastrointestinal:  Negative for blood in stool, constipation and diarrhea.  Endocrine: Negative for increased urination.  Genitourinary:  Negative for involuntary urination.  Musculoskeletal:  Positive for joint pain, joint  pain, joint swelling, myalgias, muscle weakness, morning stiffness, muscle tenderness and myalgias. Negative for gait problem.  Skin:  Negative for color change, rash, hair loss and sensitivity to sunlight.  Allergic/Immunologic: Negative for susceptible to infections.  Neurological:  Positive for headaches. Negative for dizziness.  Hematological:  Negative for swollen glands.  Psychiatric/Behavioral:  Positive for sleep disturbance. Negative for depressed mood. The patient is nervous/anxious.     PMFS History:  Patient Active Problem List   Diagnosis Date Noted   Eczema 07/06/2023   Abnormal cervical Papanicolaou smear 07/06/2023   Neck pain on left side 05/21/2023   Acute pain of right shoulder 04/09/2023   Petechial rash 01/04/2023   Macular degeneration, early 07/10/2022   Palpitations 07/03/2022   Baker cyst,  left 06/05/2022   Chronic low back pain 06/05/2022   Arthralgia 05/20/2022   Gait abnormality 05/14/2022   Thiamine deficiency 05/14/2022   Reactive arthritis of right knee (HCC) 04/14/2022   Aortic atherosclerosis (HCC) 04/06/2022   Myalgia 04/06/2022   Fatigue 04/06/2022   Arthralgia of both knees 04/06/2022   Salivary gland swelling 12/02/2020   Fall 02/13/2020   Left arm pain 02/13/2020   Hyperglycemia 06/07/2019   DDD (degenerative disc disease), cervical 02/02/2019   Musculoskeletal neck pain 10/14/2018   Lower abdominal pain 08/11/2018   Diverticulitis 08/11/2018   Shoulder pain, left 02/23/2017   B12 deficiency 08/07/2016   Recurrent UTI 11/08/2015   Non-celiac gluten sensitivity    Anxiety state 01/17/2010   Allergic rhinitis 01/17/2010   Dyslipidemia 07/12/2009   Essential hypertension 07/12/2009   Osteoporosis 06/22/2008   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   ANEMIA, PERNICIOUS 10/14/2007   IRRITABLE BOWEL SYNDROME 10/14/2007    Past Medical History:  Diagnosis Date   ALLERGIC RHINITIS    ANEMIA, PERNICIOUS    ANXIETY    BILIARY DYSKINESIA    CARPAL TUNNEL SYNDROME, LEFT    DYSLIPIDEMIA    Headache(784.0)    HYPERTENSION    Irritable bowel syndrome    Non-celiac gluten sensitivity    OSTEOPENIA    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HCC)    borderline    Family History  Problem Relation Age of Onset   Colitis Mother    Heart disease Mother    Hyperlipidemia Mother    Lung cancer Mother        Cause of death   Hypertension Mother    Lung cancer Father    Heart disease Brother    Heart attack Brother    Heart disease Brother    Valvular heart disease Brother    Heart attack Brother    Atrial fibrillation Brother    Heart failure Brother    Miscarriages / Stillbirths Brother    Valvular heart disease Brother    Leukemia Brother    Coronary artery disease Brother        MI in late 44's. Still living   Coronary artery disease Brother         MI in 80's. Also still living.   Ovarian cancer Maternal Aunt    Diabetes Maternal Aunt    Colon cancer Neg Hx    Colon polyps Neg Hx    Past Surgical History:  Procedure Laterality Date   APPENDECTOMY     CARPAL TUNNEL RELEASE     CESAREAN SECTION  76 & 85   x's 2    CHOLECYSTECTOMY  2005   Dr. Derrell Lolling (Gallbladder dyskinesia)   COLONOSCOPY     DILATION AND CURETTAGE  OF UTERUS     LEFT HEART CATHETERIZATION WITH CORONARY ANGIOGRAM N/A 05/19/2012   Procedure: LEFT HEART CATHETERIZATION WITH CORONARY ANGIOGRAM;  Surgeon: Herby Abraham, MD;  Location: [facility] CATH LAB;  Service: Cardiovascular;  Laterality: N/A;   ROTATOR CUFF REPAIR     TONSILLECTOMY     UPPER GASTROINTESTINAL ENDOSCOPY     Social History   Social History Narrative   Married and lives with husband.    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 History  Administered Date(s) Administered   Fluad Quad(high Dose 65+) 06/08/2019, 06/27/2020, 07/03/2022   Fluad Trivalent(High Dose 65+) 07/06/2023   Influenza Split 06/17/2012   Influenza, High Dose Seasonal PF 06/06/2018   Influenza,inj,Quad PF,6+ Mos 07/10/2013, 08/16/2014, 08/07/2016, 08/11/2017   PFIZER Comirnaty(Gray Top)Covid-19 Tri-Sucrose Vaccine 12/14/2020   PFIZER(Purple Top)SARS-COV-2 Vaccination 10/02/2019, 10/23/2019, 06/19/2020, 12/13/2020   Pneumococcal Conjugate-13 06/08/2019   Pneumococcal Polysaccharide-23 06/27/2020   Td 09/08/2003   Tdap 11/08/2015   Zoster Recombinant(Shingrix) 06/18/2017, 10/06/2017     Objective: Vital Signs: BP (!) 148/76 (BP Location: Left Arm, Patient Position: Sitting, Cuff Size: Normal)   Pulse 69   Resp 14   Ht 4\' 11"  (1.499 m)   Wt 115 lb (52.2 kg)   BMI 23.23 kg/m    Physical Exam HENT:     Mouth/Throat:     Mouth: Mucous membranes are moist.     Pharynx: Oropharynx is clear.  Eyes:     Conjunctiva/sclera: Conjunctivae normal.  Cardiovascular:     Rate and Rhythm: Normal rate and regular rhythm.   Pulmonary:     Effort: Pulmonary effort is normal.     Breath sounds: Normal breath sounds.  Musculoskeletal:     Right lower leg: No edema.     Left lower leg: No edema.  Lymphadenopathy:     Cervical: No cervical adenopathy.  Skin:    General: Skin is warm and dry.     Findings: No rash.  Neurological:     Mental Status: She is alert.  Psychiatric:        Mood and Affect: Mood normal.      Musculoskeletal Exam:  Shoulders full ROM no tenderness or swelling Elbows full ROM no tenderness or swelling Wrists full ROM no tenderness or swelling Fingers full ROM no tenderness or swelling Hip normal internal and external rotation without pain, no tenderness to lateral hip palpation Knees full ROM no tenderness or swelling, mild patellofemoral crepitus b/l Ankles full ROM no tenderness or swelling   Investigation: No additional findings.  Imaging: No results found.  Recent Labs: Lab Results  Component Value Date   WBC 5.9 03/04/2023   HGB 13.4 03/04/2023   PLT 211 03/04/2023   NA 135 07/06/2023   K 3.9 07/06/2023   CL 99 07/06/2023   CO2 28 07/06/2023   GLUCOSE 99 07/06/2023   BUN 7 07/06/2023   CREATININE 0.62 07/06/2023   BILITOT 0.4 07/06/2023   ALKPHOS 80 07/06/2023   AST 20 07/06/2023   ALT 15 07/06/2023   PROT 7.2 07/06/2023   ALBUMIN 4.9 07/06/2023   CALCIUM 10.0 07/06/2023   GFRAA >60 08/14/2018    Speciality Comments: No specialty comments available.  Procedures:  No procedures performed Allergies: Fosamax [alendronate sodium], Hydrocodone, Hydrocodone-acetaminophen, Hydrocodone-acetaminophen, Oxycodone-acetaminophen, Oxycodone-acetaminophen, and Tizanidine   Assessment / Plan:     Visit Diagnoses: Mild Osteoarthritis Patient reports intermittent swelling in the knee and overall body aches. Currently managing symptoms with Tai Chi and over-the-counter pain relievers (Ibuprofen,  Tylenol) as needed. -Continue current management  strategies. -Consider additional supplements for osteoarthritis as outlined in provided paperwork.  Intermittent Submandibular Swelling Patient reports occasional swelling under the mandible, possibly related to salivary gland or lymph node. No current swelling observed during examination. -No immediate action required. Monitor symptoms and seek medical attention if symptoms worsen or become more frequent.    Orders: No orders of the defined types were placed in this encounter.  No orders of the defined types were placed in this encounter.    Follow-Up Instructions: Return if symptoms worsen or fail to improve.   Fuller Plan, MD  Note - This record has been created using [employer].  Chart creation errors have been sought, but may not always  have been located. Such creation errors do not reflect on  the standard of medical care.

## 2023-09-03 ENCOUNTER — Encounter: Payer: Self-pay | Admitting: Internal Medicine

## 2023-09-03 ENCOUNTER — Ambulatory Visit: Payer: Medicare Other | Attending: Internal Medicine | Admitting: Internal Medicine

## 2023-09-03 VITALS — BP 148/76 | HR 69 | Resp 14 | Ht 59.0 in | Wt 115.0 lb

## 2023-09-03 DIAGNOSIS — Z79899 Other long term (current) drug therapy: Secondary | ICD-10-CM

## 2023-09-03 DIAGNOSIS — M02361 Reiter's disease, right knee: Secondary | ICD-10-CM

## 2023-09-03 NOTE — Patient Instructions (Signed)
 For osteoarthritis several treatments may be beneficial:  - Topical antiinflammatory medicine such as diclofenac or Voltaren can be applied to  affected area as needed. Topical analgesics containing CBD, menthol, or lidocaine can be tried.  - Oral nonsteroidal antiinflammatory drugs (NSAIDs) such as ibuprofen, aleve, celebrex, or mobic are usually helpful for osteoarthritis.  - Turmeric has some antiinflammatory effect similar to NSAIDs and may help, if taken as a supplement should not be taken above recommended doses.   - Compressive gloves or sleeve can be helpful to support the joint especially if hurting or swelling with certain activities.  - Physical therapy referral can discuss exercises or activity modification to improve symptoms or strength if needed.  - Local steroid injection is an option if symptoms become worse and not controlled by the above options.

## 2023-12-01 ENCOUNTER — Ambulatory Visit (INDEPENDENT_AMBULATORY_CARE_PROVIDER_SITE_OTHER): Payer: Medicare Other

## 2023-12-01 VITALS — Ht <= 58 in | Wt 115.0 lb

## 2023-12-01 DIAGNOSIS — Z0000 Encounter for general adult medical examination without abnormal findings: Secondary | ICD-10-CM

## 2023-12-01 NOTE — Patient Instructions (Addendum)
 [name] , Thank you for taking time to come for your Medicare Wellness Visit. I appreciate your ongoing commitment to your health goals. Please review the following plan we discussed and let me know if I can assist you in the future.   Referrals/Orders/Follow-Ups/Clinician Recommendations: Aim for 30 minutes of exercise or brisk walking, 6-8 glasses of water, and 5 servings of fruits and vegetables each day. Will request a copy of DEXA scan and Mammogram report from Dr [facility]).  This is a list of the screening recommended for you and due dates:  Health Maintenance  Topic Date Due   Mammogram  10/04/2021   DEXA scan (bone density measurement)  10/02/2022   COVID-19 Vaccine (6 - 2024-25 season) 05/09/2023   Medicare Annual Wellness Visit  11/30/2024   Colon Cancer Screening  05/22/2025   DTaP/Tdap/Td vaccine (3 - Td or Tdap) 11/07/2025   Pneumonia Vaccine  Completed   Flu Shot  Completed   Hepatitis C Screening  Completed   Zoster (Shingles) Vaccine  Completed   HPV Vaccine  Aged Out    Advanced directives: (Copy Requested) Please bring a copy of your health care power of attorney and living will to the office to be added to your chart at your convenience. You can mail to [facility] [address] [place], [place] [address] or email to [email or web address]  Next Medicare Annual Wellness Visit scheduled for next year: Yes

## 2023-12-01 NOTE — Progress Notes (Signed)
 Subjective:   [name] is a 73 y.o. who presents for a Medicare Wellness preventive visit.  Visit Complete: Virtual I connected with  [name] on 12/01/23 by a audio enabled telemedicine application and verified that I am speaking with the correct person using two identifiers.  Patient Location: Home  Provider Location: Office/Clinic  I discussed the limitations of evaluation and management by telemedicine. The patient expressed understanding and agreed to proceed.  Vital Signs: Because this visit was a virtual/telehealth visit, some criteria may be missing or patient reported. Any vitals not documented were not able to be obtained and vitals that have been documented are patient reported.  VideoDeclined- This patient declined Librarian, academic. Therefore the visit was completed with audio only.  Persons Participating in Visit: Patient.  AWV Questionnaire: No: Patient Medicare AWV questionnaire was not completed prior to this visit.  Cardiac Risk Factors include: advanced age (>22men, >2 women);hypertension;dyslipidemia     Objective:    Today's Vitals   12/01/23 1503  Weight: 115 lb (52.2 kg)  Height: 4\' 10"  (1.473 m)   Body mass index is 24.04 kg/m.     12/01/2023    3:03 PM 11/26/2022    9:52 AM 04/02/2022    5:06 PM 11/24/2021    3:34 PM 11/06/2020   11:58 AM 06/08/2019    8:47 AM 08/11/2018    1:14 PM  Advanced Directives  Does Patient Have a Medical Advance Directive? No Yes Yes Yes Yes Yes No  Type of Special educational needs teacher of [place];Living will Healthcare Power of [place];Living will Healthcare Power of [place];Living will Living will;Healthcare Power of [employer] Power of [place];Living will   Does patient want to make changes to medical advance directive?     No - Patient declined    Copy of Healthcare Power of Attorney in Chart?  No - copy requested  No - copy requested No - copy requested No -  copy requested   Would patient like information on creating a medical advance directive? Yes (MAU/Ambulatory/Procedural Areas - Information given)      No - Patient declined    Current Medications (verified) Outpatient Encounter Medications as of 12/01/2023  Medication Sig   ALPRAZolam (XANAX) 0.25 MG tablet TAKE 1 TABLET(0.25 MG) BY MOUTH TWICE DAILY AS NEEDED FOR ANXIETY   amLODipine (NORVASC) 10 MG tablet TAKE 1 TABLET(10 MG) BY MOUTH DAILY   CALCIUM CITRATE PO Take by mouth.   cetirizine (ZYRTEC) 10 MG tablet Take 10 mg by mouth daily.   Cholecalciferol (VITAMIN D-3 PO) Take by mouth.   hydrochlorothiazide (HYDRODIURIL) 12.5 MG tablet TAKE 1 TABLET(12.5 MG) BY MOUTH DAILY   pravastatin (PRAVACHOL) 40 MG tablet TAKE 1 TABLET(40 MG) BY MOUTH DAILY   [DISCONTINUED] meloxicam (MOBIC) 7.5 MG tablet Take 1 tablet (7.5 mg total) by mouth daily.   No facility-administered encounter medications on file as of 12/01/2023.    Allergies (verified) Fosamax [alendronate sodium], Hydrocodone, Hydrocodone-acetaminophen, Hydrocodone-acetaminophen, Oxycodone-acetaminophen, Oxycodone-acetaminophen, and Tizanidine   History: Past Medical History:  Diagnosis Date   ALLERGIC RHINITIS    ANEMIA, PERNICIOUS    ANXIETY    BILIARY DYSKINESIA    CARPAL TUNNEL SYNDROME, LEFT    DYSLIPIDEMIA    Headache(784.0)    HYPERTENSION    Irritable bowel syndrome    Non-celiac gluten sensitivity    OSTEOPENIA    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HCC)    borderline   Past Surgical History:  Procedure Laterality Date  APPENDECTOMY     CARPAL TUNNEL RELEASE     CESAREAN SECTION  76 & 85   x's 2    CHOLECYSTECTOMY  2005   Dr. Derrell Lolling (Gallbladder dyskinesia)   COLONOSCOPY     DILATION AND CURETTAGE OF UTERUS     LEFT HEART CATHETERIZATION WITH CORONARY ANGIOGRAM N/A 05/19/2012   Procedure: LEFT HEART CATHETERIZATION WITH CORONARY ANGIOGRAM;  Surgeon: Herby Abraham, MD;  Location: [facility] CATH LAB;  Service:  Cardiovascular;  Laterality: N/A;   ROTATOR CUFF REPAIR     TONSILLECTOMY     UPPER GASTROINTESTINAL ENDOSCOPY     Family History  Problem Relation Age of Onset   Colitis Mother    Heart disease Mother    Hyperlipidemia Mother    Lung cancer Mother        Cause of death   Hypertension Mother    Lung cancer Father    Heart disease Brother    Heart attack Brother    Heart disease Brother    Valvular heart disease Brother    Heart attack Brother    Atrial fibrillation Brother    Heart failure Brother    Miscarriages / Stillbirths Brother    Valvular heart disease Brother    Leukemia Brother    Coronary artery disease Brother        MI in late 6's. Still living   Coronary artery disease Brother        MI in 50's. Also still living.   Ovarian cancer Maternal Aunt    Diabetes Maternal Aunt    Colon cancer Neg Hx    Colon polyps Neg Hx    Social History   Socioeconomic History   Marital status: Married    Spouse name: Not on file   Number of children: Not on file   Years of education: Not on file   Highest education level: Associate degree: academic program  Occupational History   Occupation: Dental office/ Retired    Associate Professor: [employer]  Tobacco Use   Smoking status: Never    Passive exposure: Never   Smokeless tobacco: Never  Vaping Use   Vaping status: Never Used  Substance and Sexual Activity   Alcohol use: Yes    Alcohol/week: 1.0 standard drink of alcohol    Types: 1 Glasses of wine per week    Comment: occasionally   Drug use: No   Sexual activity: Yes    Birth control/protection: Post-menopausal  Other Topics Concern   Not on file  Social History Narrative   Married and lives with husband.    Dental hygienist - retired   Teacher, early years/pre Strain: Low Risk  (12/01/2023)   Overall Financial Resource Strain (CARDIA)    Difficulty of Paying Living Expenses: Not hard at all  Food Insecurity: No Food Insecurity  (12/01/2023)   Hunger Vital Sign    Worried About Running Out of Food in the Last Year: Never true    Ran Out of Food in the Last Year: Never true  Transportation Needs: No Transportation Needs (12/01/2023)   PRAPARE - Administrator, Civil Service (Medical): No    Lack of Transportation (Non-Medical): No  Physical Activity: Sufficiently Active (12/01/2023)   Exercise Vital Sign    Days of Exercise per Week: 5 days    Minutes of Exercise per Session: 150+ min  Stress: No Stress Concern Present (12/01/2023)   [employer] of Occupational Health - Occupational  Stress Questionnaire    Feeling of Stress : Not at all  Social Connections: Moderately Isolated (12/01/2023)   Social Connection and Isolation Panel [NHANES]    Frequency of Communication with Friends and Family: More than three times a week    Frequency of Social Gatherings with Friends and Family: More than three times a week    Attends Religious Services: Never    Database administrator or Organizations: No    Attends Engineer, structural: Never    Marital Status: Married    Tobacco Counseling Counseling given: No    Clinical Intake:  Pre-visit preparation completed: Yes  Pain : No/denies pain     BMI - recorded: 24.04 Nutritional Status: BMI of 19-24  Normal Nutritional Risks: None Diabetes: No  Lab Results  Component Value Date   HGBA1C 5.1 06/26/2021   HGBA1C 5.4 06/08/2019   HGBA1C 5.2 08/11/2017     How often do you need to have someone help you when you read instructions, pamphlets, or other written materials from your doctor or pharmacy?: 1 - Never  Interpreter Needed?: No  Information entered by :: Hassell Halim, CMA   Activities of Daily Living     12/01/2023    3:05 PM  In your present state of health, do you have any difficulty performing the following activities:  Hearing? 0  Vision? 0  Difficulty concentrating or making decisions? 0  Walking or climbing stairs? 0   Dressing or bathing? 0  Doing errands, shopping? 0  Preparing Food and eating ? N  Using the Toilet? N  In the past six months, have you accidently leaked urine? N  Do you have problems with loss of bowel control? N  Managing your Medications? N  Managing your Finances? N  Housekeeping or managing your Housekeeping? N    Patient Care Team: Pincus Sanes, MD as PCP - General (Internal Medicine) Ralene Cork, DO as Consulting Physician (Sports Medicine) Freddy Finner, MD (Inactive) (Obstetrics and Gynecology) Barnett Abu, MD (Neurosurgery) Herby Abraham, MD (Cardiology) Iva Boop, MD (Gastroenterology) Kathyrn Sheriff, [facility] (Inactive) as Pharmacist (Pharmacist) Pollyann Samples, NP as Nurse Practitioner (Hematology and Oncology) Tobias Alexander, OD as Referring Physician (Optometry) Marcelle Overlie, MD as Consulting Physician (Obstetrics and Gynecology)  Indicate any recent Medical Services you may have received from other than [facility] providers in the past year (date may be approximate).     Assessment:   This is a routine wellness examination for [name].  Hearing/Vision screen Hearing Screening - Comments:: Denies hearing difficulties   Vision Screening - Comments:: Wears rx glasses - up to date with routine eye exams with Dr Tobias Alexander   Goals Addressed               This Visit's Progress     Patient Stated (pt-stated)        Patient stated she is going to try to stay away from sweets (sugar treats).       Depression Screen     12/01/2023    3:08 PM 04/09/2023    9:59 AM 11/26/2022    9:58 AM 04/14/2022   10:49 AM 04/06/2022    3:04 PM 03/26/2022    9:28 AM 11/24/2021    3:35 PM  PHQ 2/9 Scores  PHQ - 2 Score 0 0 0 0 0 0 0  PHQ- 9 Score 1 2 0 7 5 0     Fall Risk  12/01/2023    3:06 PM 04/09/2023    9:59 AM 11/26/2022    9:52 AM 11/25/2022    8:01 AM 05/20/2022    2:28 PM  Fall Risk   Falls in the past year? 0 0 0 0 0  Number falls in  past yr: 0 0 0 0 0  Injury with Fall? 0 0 0 0 0  Risk for fall due to : No Fall Risks No Fall Risks No Fall Risks  No Fall Risks  Follow up Falls prevention discussed;Falls evaluation completed Falls evaluation completed Falls prevention discussed  Falls evaluation completed    MEDICARE RISK AT HOME:  Medicare Risk at Home Any stairs in or around the home?: No If so, are there any without handrails?: No Home free of loose throw rugs in walkways, pet beds, electrical cords, etc?: Yes Adequate lighting in your home to reduce risk of falls?: Yes Life alert?: No Use of a cane, walker or w/c?: No Grab bars in the bathroom?: Yes Shower chair or bench in shower?: No Elevated toilet seat or a handicapped toilet?: Yes  TIMED UP AND GO:  Was the test performed?  No  Cognitive Function: 6CIT completed        12/01/2023    3:10 PM 11/26/2022    9:54 AM  6CIT Screen  What Year? 0 points 0 points  What month? 0 points 0 points  What time? 0 points 0 points  Count back from 20 0 points 0 points  Months in reverse 0 points 0 points  Repeat phrase 0 points 0 points  Total Score 0 points 0 points    Immunizations Immunization History  Administered Date(s) Administered   Fluad Quad(high Dose 65+) 06/08/2019, 06/27/2020, 07/03/2022   Fluad Trivalent(High Dose 65+) 07/06/2023   Influenza Split 06/17/2012   Influenza, High Dose Seasonal PF 06/06/2018   Influenza,inj,Quad PF,6+ Mos 07/10/2013, 08/16/2014, 08/07/2016, 08/11/2017   PFIZER Comirnaty(Gray Top)Covid-19 Tri-Sucrose Vaccine 12/14/2020   PFIZER(Purple Top)SARS-COV-2 Vaccination 10/02/2019, 10/23/2019, 06/19/2020, 12/13/2020   Pneumococcal Conjugate-13 06/08/2019   Pneumococcal Polysaccharide-23 06/27/2020   Td 09/08/2003   Tdap 11/08/2015   Zoster Recombinant(Shingrix) 06/18/2017, 10/06/2017    Screening Tests Health Maintenance  Topic Date Due   MAMMOGRAM  10/04/2021   DEXA SCAN  10/02/2022   COVID-19 Vaccine (6 -  2024-25 season) 05/09/2023   Medicare Annual Wellness (AWV)  11/30/2024   Colonoscopy  05/22/2025   DTaP/Tdap/Td (3 - Td or Tdap) 11/07/2025   Pneumonia Vaccine 41+ Years old  Completed   INFLUENZA VACCINE  Completed   Hepatitis C Screening  Completed   Zoster Vaccines- Shingrix  Completed   HPV VACCINES  Aged Out    Health Maintenance  Health Maintenance Due  Topic Date Due   MAMMOGRAM  10/04/2021   DEXA SCAN  10/02/2022   COVID-19 Vaccine (6 - 2024-25 season) 05/09/2023   Health Maintenance Items Addressed: 12/01/2023  Additional Screening:  Vision Screening: Recommended annual ophthalmology exams for early detection of glaucoma and other disorders of the eye.  Dental Screening: Recommended annual dental exams for proper oral hygiene   Community Resource Referral / Chronic Care Management: CRR required this visit?  No   CCM required this visit?  No     Plan:     I have personally reviewed and noted the following in the patient's chart:   Medical and social history Use of alcohol, tobacco or illicit drugs  Current medications and supplements including opioid prescriptions. Patient is not currently  taking opioid prescriptions. Functional ability and status Nutritional status Physical activity Advanced directives List of other physicians Hospitalizations, surgeries, and ER visits in previous 12 months Vitals Screenings to include cognitive, depression, and falls Referrals and appointments  In addition, I have reviewed and discussed with patient certain preventive protocols, quality metrics, and best practice recommendations. A written personalized care plan for preventive services as well as general preventive health recommendations were provided to patient.     Darreld Mclean, CMA   12/01/2023   After Visit Summary: (In Person-Declined) Patient declined AVS at this time.  Notes: Requested DEXA Scan and Mammogram reports from Dr Marcelle Overlie [facility]

## 2023-12-26 ENCOUNTER — Other Ambulatory Visit: Payer: Self-pay | Admitting: Internal Medicine

## 2024-01-03 NOTE — Patient Instructions (Addendum)
      Blood work was ordered.       Medications changes include :   None    A referral was ordered and someone will call you to schedule an appointment.     Return in about 6 months (around 07/05/2024) for follow up.

## 2024-01-03 NOTE — Progress Notes (Unsigned)
 Subjective:    Patient ID: [name], female    DOB: 1951/04/18, 73 y.o.   MRN: [id number]     HPI An is here for follow up of her chronic medical problems.  Last month - working in yard - moving heavy container twice - the next day she had lower back pain.   She took tylenol  and ibuprofen .  She tried doing her tai chi and felt a sharp pain from left lower back through to left lower back.  It was hard to lift her left leg at times - felt twinge in back.  It has gotten better.  The last couple of months - would get swelling in hands and feet.  And she was getting bloated.  Not related to food she ate. It is better the past few days.    Occ zingy pain in lower abdomen - transient.      Medications and allergies reviewed with patient and updated if appropriate.  Current Outpatient Medications on File Prior to Visit  Medication Sig Dispense Refill   ALPRAZolam  (XANAX ) 0.25 MG tablet TAKE 1 TABLET(0.25 MG) BY MOUTH TWICE DAILY AS NEEDED FOR ANXIETY 30 tablet 0   amLODipine  (NORVASC ) 10 MG tablet TAKE 1 TABLET(10 MG) BY MOUTH DAILY 90 tablet 2   CALCIUM  CITRATE PO Take by mouth.     cetirizine (ZYRTEC) 10 MG tablet Take 10 mg by mouth daily.     Cholecalciferol  (VITAMIN D -3 PO) Take by mouth.     hydrochlorothiazide  (HYDRODIURIL ) 12.5 MG tablet TAKE 1 TABLET(12.5 MG) BY MOUTH DAILY 90 tablet 3   pravastatin  (PRAVACHOL ) 40 MG tablet TAKE 1 TABLET(40 MG) BY MOUTH DAILY 90 tablet 2   No current facility-administered medications on file prior to visit.     Review of Systems  Constitutional:  Negative for fever.  Respiratory:  Positive for cough (more at night - allergies). Negative for shortness of breath and wheezing.   Cardiovascular:  Positive for leg swelling. Negative for chest pain and palpitations.  Genitourinary:  Positive for frequency and urgency. Negative for dysuria and hematuria.  Musculoskeletal:  Positive for back pain (left lower back). Negative for  arthralgias.  Neurological:  Negative for light-headedness and headaches.       Objective:   Vitals:   01/04/24 1300  BP: 130/74  Pulse: 82  Temp: 98 F (36.7 C)  SpO2: 100%   BP Readings from Last 3 Encounters:  01/04/24 130/74  09/03/23 (!) 148/76  07/06/23 110/80   Wt Readings from Last 3 Encounters:  01/04/24 112 lb (50.8 kg)  12/01/23 115 lb (52.2 kg)  09/03/23 115 lb (52.2 kg)   Body mass index is 23.41 kg/m.    Physical Exam Constitutional:      General: She is not in acute distress.    Appearance: Normal appearance.  HENT:     Head: Normocephalic and atraumatic.  Eyes:     Conjunctiva/sclera: Conjunctivae normal.  Cardiovascular:     Rate and Rhythm: Normal rate and regular rhythm.     Heart sounds: Normal heart sounds.  Pulmonary:     Effort: Pulmonary effort is normal. No respiratory distress.     Breath sounds: Normal breath sounds. No wheezing.  Musculoskeletal:     Cervical back: Neck supple.     Right lower leg: No edema.     Left lower leg: No edema.  Lymphadenopathy:     Cervical: No cervical adenopathy.  Skin:  General: Skin is warm and dry.     Findings: No rash.  Neurological:     Mental Status: She is alert. Mental status is at baseline.  Psychiatric:        Mood and Affect: Mood normal.        Behavior: Behavior normal.        Lab Results  Component Value Date   WBC 5.9 03/04/2023   HGB 13.4 03/04/2023   HCT 40.1 03/04/2023   PLT 211 03/04/2023   GLUCOSE 99 07/06/2023   CHOL 191 07/06/2023   TRIG 47.0 07/06/2023   HDL 94.30 07/06/2023   LDLDIRECT 87.0 09/16/2012   LDLCALC 87 07/06/2023   ALT 15 07/06/2023   AST 20 07/06/2023   NA 135 07/06/2023   K 3.9 07/06/2023   CL 99 07/06/2023   CREATININE 0.62 07/06/2023   BUN 7 07/06/2023   CO2 28 07/06/2023   TSH 1.10 04/14/2022   INR 1.09 05/19/2012   HGBA1C 5.1 06/26/2021     Assessment & Plan:    See Problem List for Assessment and Plan of chronic medical  problems.

## 2024-01-04 ENCOUNTER — Ambulatory Visit (INDEPENDENT_AMBULATORY_CARE_PROVIDER_SITE_OTHER): Payer: Medicare Other | Admitting: Internal Medicine

## 2024-01-04 ENCOUNTER — Encounter: Payer: Self-pay | Admitting: Internal Medicine

## 2024-01-04 VITALS — BP 130/74 | HR 82 | Temp 98.0°F | Ht <= 58 in | Wt 112.0 lb

## 2024-01-04 DIAGNOSIS — F411 Generalized anxiety disorder: Secondary | ICD-10-CM | POA: Diagnosis not present

## 2024-01-04 DIAGNOSIS — I10 Essential (primary) hypertension: Secondary | ICD-10-CM

## 2024-01-04 DIAGNOSIS — E785 Hyperlipidemia, unspecified: Secondary | ICD-10-CM

## 2024-01-04 DIAGNOSIS — R350 Frequency of micturition: Secondary | ICD-10-CM

## 2024-01-04 LAB — URINALYSIS, ROUTINE W REFLEX MICROSCOPIC
Bilirubin Urine: NEGATIVE
Ketones, ur: NEGATIVE
Leukocytes,Ua: NEGATIVE
Nitrite: NEGATIVE
Specific Gravity, Urine: <=1.005 — AB (ref 1.000–1.030)
Total Protein, Urine: NEGATIVE
Urine Glucose: NEGATIVE
Urobilinogen, UA: 0.2 (ref 0.0–1.0)
pH: 7 (ref 5.0–8.0)

## 2024-01-04 LAB — CBC WITH DIFFERENTIAL/PLATELET
Basophils Absolute: 0 10*3/uL (ref 0.0–0.1)
Basophils Relative: 0.4 % (ref 0.0–3.0)
Eosinophils Absolute: 0 10*3/uL (ref 0.0–0.7)
Eosinophils Relative: 0.7 % (ref 0.0–5.0)
HCT: 39.4 % (ref 36.0–46.0)
Hemoglobin: 13.4 g/dL (ref 12.0–15.0)
Lymphocytes Relative: 23.4 % (ref 12.0–46.0)
Lymphs Abs: 1.3 10*3/uL (ref 0.7–4.0)
MCHC: 34.1 g/dL (ref 30.0–36.0)
MCV: 90.4 fl (ref 78.0–100.0)
Monocytes Absolute: 0.4 10*3/uL (ref 0.1–1.0)
Monocytes Relative: 6.2 % (ref 3.0–12.0)
Neutro Abs: 4 10*3/uL (ref 1.4–7.7)
Neutrophils Relative %: 69.3 % (ref 43.0–77.0)
Platelets: 260 10*3/uL (ref 150.0–400.0)
RBC: 4.36 Mil/uL (ref 3.87–5.11)
RDW: 13.8 % (ref 11.5–15.5)
WBC: 5.7 10*3/uL (ref 4.0–10.5)

## 2024-01-04 LAB — LIPID PANEL
Cholesterol: 230 mg/dL — ABNORMAL HIGH (ref 0–200)
HDL: 101.7 mg/dL (ref >39.00)
LDL Cholesterol: 115 mg/dL — ABNORMAL HIGH (ref 0–99)
NonHDL: 128.45
Total CHOL/HDL Ratio: 2
Triglycerides: 66 mg/dL (ref 0.0–149.0)
VLDL: 13.2 mg/dL (ref 0.0–40.0)

## 2024-01-04 LAB — COMPREHENSIVE METABOLIC PANEL WITH GFR
ALT: 20 U/L (ref 0–35)
AST: 24 U/L (ref 0–37)
Albumin: 5.3 g/dL — ABNORMAL HIGH (ref 3.5–5.2)
Alkaline Phosphatase: 70 U/L (ref 39–117)
BUN: 8 mg/dL (ref 6–23)
CO2: 33 meq/L — ABNORMAL HIGH (ref 19–32)
Calcium: 10.3 mg/dL (ref 8.4–10.5)
Chloride: 90 meq/L — ABNORMAL LOW (ref 96–112)
Creatinine, Ser: 0.62 mg/dL (ref 0.40–1.20)
GFR: 90.01 mL/min (ref >60.00)
Glucose, Bld: 103 mg/dL — ABNORMAL HIGH (ref 70–99)
Potassium: 3.6 meq/L (ref 3.5–5.1)
Sodium: 131 meq/L — ABNORMAL LOW (ref 135–145)
Total Bilirubin: 0.6 mg/dL (ref 0.2–1.2)
Total Protein: 7.4 g/dL (ref 6.0–8.3)

## 2024-01-04 NOTE — Assessment & Plan Note (Signed)
Chronic Blood pressure controlled cmp Continue amlodipine 10 mg daily, hctz 12.5 mg daily

## 2024-01-04 NOTE — Assessment & Plan Note (Signed)
 Acute Urinary frequency and urgency Ua, ucx

## 2024-01-04 NOTE — Assessment & Plan Note (Signed)
Chronic Controlled, stable Continue xanax 0.25 mg bid prn-takes infrequently  

## 2024-01-04 NOTE — Assessment & Plan Note (Signed)
Chronic Check lipid panel, cmp  Continue pravastatin 40 mg daily Regular exercise and healthy diet encouraged

## 2024-01-05 ENCOUNTER — Encounter: Payer: Self-pay | Admitting: Internal Medicine

## 2024-01-05 LAB — URINE CULTURE: Result:: NO GROWTH

## 2024-01-10 ENCOUNTER — Encounter: Payer: Self-pay | Admitting: Internal Medicine

## 2024-01-12 ENCOUNTER — Encounter: Payer: Self-pay | Admitting: Internal Medicine

## 2024-01-12 DIAGNOSIS — M545 Low back pain, unspecified: Secondary | ICD-10-CM | POA: Insufficient documentation

## 2024-01-12 NOTE — Progress Notes (Unsigned)
    Subjective:    Patient ID: [name], female    DOB: July 18, 1951, 73 y.o.   MRN: [id number]      HPI [name] is here for No chief complaint on file.   Approximately couple of weeks ago she was doing yard work and lifted something heavy and has had lower back pain since then.  Pain is concentrated in the left lower back.  She has been taking Tylenol  and ibuprofen .  She has difficulty lifting her left leg because it increases the pain.  As so it hurts more to sit.     Medications and allergies reviewed with patient and updated if appropriate.  Current Outpatient Medications on File Prior to Visit  Medication Sig Dispense Refill   ALPRAZolam  (XANAX ) 0.25 MG tablet TAKE 1 TABLET(0.25 MG) BY MOUTH TWICE DAILY AS NEEDED FOR ANXIETY 30 tablet 0   amLODipine  (NORVASC ) 10 MG tablet TAKE 1 TABLET(10 MG) BY MOUTH DAILY 90 tablet 2   CALCIUM  CITRATE PO Take by mouth.     cetirizine (ZYRTEC) 10 MG tablet Take 10 mg by mouth daily.     Cholecalciferol  (VITAMIN D -3 PO) Take by mouth.     hydrochlorothiazide  (HYDRODIURIL ) 12.5 MG tablet TAKE 1 TABLET(12.5 MG) BY MOUTH DAILY 90 tablet 3   pravastatin  (PRAVACHOL ) 40 MG tablet TAKE 1 TABLET(40 MG) BY MOUTH DAILY 90 tablet 2   No current facility-administered medications on file prior to visit.    Review of Systems     Objective:  There were no vitals filed for this visit. BP Readings from Last 3 Encounters:  01/04/24 130/74  09/03/23 (!) 148/76  07/06/23 110/80   Wt Readings from Last 3 Encounters:  01/04/24 112 lb (50.8 kg)  12/01/23 115 lb (52.2 kg)  09/03/23 115 lb (52.2 kg)   There is no height or weight on file to calculate BMI.    Physical Exam         Assessment & Plan:    See Problem List for Assessment and Plan of chronic medical problems.

## 2024-01-13 ENCOUNTER — Ambulatory Visit (INDEPENDENT_AMBULATORY_CARE_PROVIDER_SITE_OTHER)

## 2024-01-13 ENCOUNTER — Ambulatory Visit (INDEPENDENT_AMBULATORY_CARE_PROVIDER_SITE_OTHER): Admitting: Internal Medicine

## 2024-01-13 ENCOUNTER — Encounter: Payer: Self-pay | Admitting: Internal Medicine

## 2024-01-13 VITALS — BP 140/72 | HR 66 | Temp 98.0°F | Ht <= 58 in | Wt 112.0 lb

## 2024-01-13 DIAGNOSIS — M419 Scoliosis, unspecified: Secondary | ICD-10-CM | POA: Diagnosis not present

## 2024-01-13 DIAGNOSIS — R140 Abdominal distension (gaseous): Secondary | ICD-10-CM | POA: Diagnosis not present

## 2024-01-13 DIAGNOSIS — M545 Low back pain, unspecified: Secondary | ICD-10-CM

## 2024-01-13 DIAGNOSIS — M47816 Spondylosis without myelopathy or radiculopathy, lumbar region: Secondary | ICD-10-CM | POA: Diagnosis not present

## 2024-01-13 DIAGNOSIS — M4317 Spondylolisthesis, lumbosacral region: Secondary | ICD-10-CM | POA: Diagnosis not present

## 2024-01-13 DIAGNOSIS — I878 Other specified disorders of veins: Secondary | ICD-10-CM | POA: Diagnosis not present

## 2024-01-13 MED ORDER — PREDNISONE 20 MG PO TABS: 40.0000 mg | ORAL_TABLET | Freq: Every day | ORAL | 0 refills | Status: AC

## 2024-01-13 NOTE — Patient Instructions (Addendum)
      Have an xray today downstairs      Medications changes include :   prednisone  40 mg daily x 5 days - do not take any ibuprofen  when on this.  Continue ice.  You can try topical medications.     Continue miralax, probiotic   Return if symptoms worsen or fail to improve.

## 2024-01-13 NOTE — Assessment & Plan Note (Signed)
 Acute States this started 6-8 weeks ago Denied abdominal pain, but was slightly tender in the left lower quadrant Denies constipation, diarrhea, nausea or fevers Has started taking MiraLAX and a probiotic-not sure if it is helping or not yet Continue MiraLAX and probiotic I wonder if she has some mild constipation-still having regular bowel movements and does not realize she is constipated-if so I could cause the bloating and she could improve She will let me know if there is no improvement

## 2024-01-13 NOTE — Assessment & Plan Note (Signed)
 Acute Started about 3 weeks ago after lifting something heavy a couple of times Initially felt pain across the lower back and now has been localized in the left lower back Pain is worse with lifting left leg and sitting down No radiation, numbness or tingling Tylenol  and ibuprofen  are helping, but pain has not resolved Prednisone  40 mg daily x 5 days-she knows not to take any ibuprofen  while taking the prednisone  Her lumbar x-ray today-does have history of arthritis 10 years ago and we will see if that is advanced or any other changes-she does have osteoporosis so we will rule out fracture She does have a muscle relaxer at home that she can try.  Would hold off on gabapentin  since there is no radiculopathy If no improvement consider physical therapy or referral to sports medicine

## 2024-01-17 ENCOUNTER — Encounter: Payer: Self-pay | Admitting: Internal Medicine

## 2024-01-26 ENCOUNTER — Ambulatory Visit (INDEPENDENT_AMBULATORY_CARE_PROVIDER_SITE_OTHER): Admitting: Sports Medicine

## 2024-01-26 VITALS — BP 118/76 | HR 72 | Ht <= 58 in | Wt 120.0 lb

## 2024-01-26 DIAGNOSIS — G8929 Other chronic pain: Secondary | ICD-10-CM

## 2024-01-26 DIAGNOSIS — M51362 Other intervertebral disc degeneration, lumbar region with discogenic back pain and lower extremity pain: Secondary | ICD-10-CM | POA: Diagnosis not present

## 2024-01-26 DIAGNOSIS — M4126 Other idiopathic scoliosis, lumbar region: Secondary | ICD-10-CM

## 2024-01-26 NOTE — Progress Notes (Signed)
 Ben Joaquim Tolen D.[facility] [address] [place] [address] Phone: [phone]   Assessment and Plan:     1. Chronic bilateral low back pain without sciatica (Primary) 2. Degeneration of intervertebral disc of lumbar region with discogenic back pain 3. Other idiopathic scoliosis, lumbar region -Chronic with exacerbation, initial visit - Low back pain for 6 weeks with left-sided radicular symptoms.  Consistent with lumbar degenerative changes causing left-sided lumbar radiculopathy - Reviewed x-ray with patient in clinic and showed lumbar scoliosis, degenerative changes throughout lumbar spine, vertebral spurring, facet arthrosis - Due to no improvement over the past 6 weeks despite conservative therapy, continued pain >6/10, pain with day-to-day activities, left-sided radicular symptoms, tenderness to vertebral process palpation, recommend further evaluation with lumbar MRI - Start Tylenol  500 to 1000 mg tablets 2-3 times a day for day-to-day pain relief  -Patient may use NSAIDs as needed for breakthrough pain - No significant relief with prednisone  course   Pertinent previous records reviewed include Lumbar x-ray 01/13/2024, internal medicine note 01/13/2024  Follow Up: 1 week after MRI to review results and discuss treatment plan.  Could consider NSAID course versus epidural CSI if appropriate based off imaging   Subjective:   I, [name], am serving as a Neurosurgeon for Doctor Ulysees Gander  Chief Complaint: back pain   HPI:   01/26/24 Patient is a 73 year old female with back pain. Patient states last month she was moving a pot and flared her low back. Pain is radiating to the left side. Prednisone  didn't help much .  Saw PCP 01/13/2024 Approximately three weeks ago she was doing yard work and lifted something heavy and has had lower back pain since then.  Initially pain was across the lower back and now it is concentrated in the left lower  back.  She has been taking Tylenol  and ibuprofen .  They have helped, but her pain is still there.   No radiating pain.     She has difficulty lifting her left leg because it increases the pain.  As so it hurts more to sit - she feels it through to her abdomen.  She had a sharp pain across her lower abdomen for a little while.    Relevant Historical Information: Hypertension, osteoporosis  Additional pertinent review of systems negative.   Current Outpatient Medications:    ALPRAZolam  (XANAX ) 0.25 MG tablet, TAKE 1 TABLET(0.25 MG) BY MOUTH TWICE DAILY AS NEEDED FOR ANXIETY, Disp: 30 tablet, Rfl: 0   amLODipine  (NORVASC ) 10 MG tablet, TAKE 1 TABLET(10 MG) BY MOUTH DAILY, Disp: 90 tablet, Rfl: 2   CALCIUM  CITRATE PO, Take by mouth., Disp: , Rfl:    cetirizine (ZYRTEC) 10 MG tablet, Take 10 mg by mouth daily., Disp: , Rfl:    Cholecalciferol  (VITAMIN D -3 PO), Take by mouth., Disp: , Rfl:    hydrochlorothiazide  (HYDRODIURIL ) 12.5 MG tablet, TAKE 1 TABLET(12.5 MG) BY MOUTH DAILY, Disp: 90 tablet, Rfl: 3   pravastatin  (PRAVACHOL ) 40 MG tablet, TAKE 1 TABLET(40 MG) BY MOUTH DAILY, Disp: 90 tablet, Rfl: 2   Objective:     Vitals:   01/26/24 1513  BP: 118/76  Pulse: 72  SpO2: 99%  Weight: 120 lb (54.4 kg)  Height: 4\' 10"  (1.473 m)      Body mass index is 25.08 kg/m.    Physical Exam:    Gen: Appears well, nad, nontoxic and pleasant Psych: Alert and oriented, appropriate mood and affect Neuro: Decreased sensation to  light touch on left lower extremity compared to right.  Otherwise sensation intact, strength is 5/5 in upper and lower extremities, muscle tone wnl Skin: no susupicious lesions or rashes  Back - Normal skin, Spine with normal alignment and no deformity.     tenderness to L3-L5 vertebral process palpation.   Lumbar paraspinous muscles are mildly tender and without spasm NTTP gluteal musculature Straight leg raise positive left Trendelenberg negative Piriformis Test  negative Gait normal  Pain with lumbar extension  Electronically signed by:  Marshall Skeeter D.[facility] 4:35 PM 01/26/24

## 2024-01-26 NOTE — Patient Instructions (Signed)
 MRI lumbar  Tylenol  [id number] mg 2-3 times a day for pain relief  NSAIDS as needed for breakthrough pain  Follow up 1 week after MRI to discuss results

## 2024-02-01 ENCOUNTER — Ambulatory Visit (INDEPENDENT_AMBULATORY_CARE_PROVIDER_SITE_OTHER)

## 2024-02-01 DIAGNOSIS — M545 Low back pain, unspecified: Secondary | ICD-10-CM

## 2024-02-01 DIAGNOSIS — G8929 Other chronic pain: Secondary | ICD-10-CM

## 2024-02-01 DIAGNOSIS — X509XXA Other and unspecified overexertion or strenuous movements or postures, initial encounter: Secondary | ICD-10-CM | POA: Diagnosis not present

## 2024-02-01 DIAGNOSIS — M48061 Spinal stenosis, lumbar region without neurogenic claudication: Secondary | ICD-10-CM | POA: Diagnosis not present

## 2024-02-01 DIAGNOSIS — M51362 Other intervertebral disc degeneration, lumbar region with discogenic back pain and lower extremity pain: Secondary | ICD-10-CM

## 2024-02-01 DIAGNOSIS — M4126 Other idiopathic scoliosis, lumbar region: Secondary | ICD-10-CM

## 2024-02-01 DIAGNOSIS — M47816 Spondylosis without myelopathy or radiculopathy, lumbar region: Secondary | ICD-10-CM | POA: Diagnosis not present

## 2024-02-01 DIAGNOSIS — R609 Edema, unspecified: Secondary | ICD-10-CM | POA: Diagnosis not present

## 2024-02-01 DIAGNOSIS — M4317 Spondylolisthesis, lumbosacral region: Secondary | ICD-10-CM | POA: Diagnosis not present

## 2024-02-15 NOTE — Progress Notes (Unsigned)
    [name] D.[name] [address] [place] [address] Phone: [phone]   Assessment and Plan:     There are no diagnoses linked to this encounter.  ***   Pertinent previous records reviewed include ***    Follow Up: ***     Subjective:   I, [name], am serving as a Neurosurgeon for Doctor [name]   Chief Complaint: back pain    HPI:    01/26/24 Patient is a 73 year old female with back pain. Patient states last month she was moving a pot and flared her low back. Pain is radiating to the left side. Prednisone  didn't help much .  Saw PCP 01/13/2024 Approximately three weeks ago she was doing yard work and lifted something heavy and has had lower back pain since then.  Initially pain was across the lower back and now it is concentrated in the left lower back.  She has been taking Tylenol  and ibuprofen .  They have helped, but her pain is still there.   No radiating pain.     She has difficulty lifting her left leg because it increases the pain.  As so it hurts more to sit - she feels it through to her abdomen.  She had a sharp pain across her lower abdomen for a little while.    02/16/2024 Patient states   Relevant Historical Information: Hypertension, osteoporosis  Additional pertinent review of systems negative.   Current Outpatient Medications:    ALPRAZolam  (XANAX ) 0.25 MG tablet, TAKE 1 TABLET(0.25 MG) BY MOUTH TWICE DAILY AS NEEDED FOR ANXIETY, Disp: 30 tablet, Rfl: 0   amLODipine  (NORVASC ) 10 MG tablet, TAKE 1 TABLET(10 MG) BY MOUTH DAILY, Disp: 90 tablet, Rfl: 2   CALCIUM  CITRATE PO, Take by mouth., Disp: , Rfl:    cetirizine (ZYRTEC) 10 MG tablet, Take 10 mg by mouth daily., Disp: , Rfl:    Cholecalciferol  (VITAMIN D -3 PO), Take by mouth., Disp: , Rfl:    hydrochlorothiazide  (HYDRODIURIL ) 12.5 MG tablet, TAKE 1 TABLET(12.5 MG) BY MOUTH DAILY, Disp: 90 tablet, Rfl: 3   pravastatin  (PRAVACHOL ) 40 MG tablet,  TAKE 1 TABLET(40 MG) BY MOUTH DAILY, Disp: 90 tablet, Rfl: 2   Objective:     There were no vitals filed for this visit.    There is no height or weight on file to calculate BMI.    Physical Exam:    ***   Electronically signed by:  [name] D.[name] 7:58 AM 02/15/24

## 2024-02-16 ENCOUNTER — Ambulatory Visit (INDEPENDENT_AMBULATORY_CARE_PROVIDER_SITE_OTHER): Admitting: Sports Medicine

## 2024-02-16 VITALS — BP 120/82 | HR 81 | Ht <= 58 in | Wt 117.0 lb

## 2024-02-16 DIAGNOSIS — G8929 Other chronic pain: Secondary | ICD-10-CM | POA: Diagnosis not present

## 2024-02-16 DIAGNOSIS — M4126 Other idiopathic scoliosis, lumbar region: Secondary | ICD-10-CM | POA: Diagnosis not present

## 2024-02-16 DIAGNOSIS — M51362 Other intervertebral disc degeneration, lumbar region with discogenic back pain and lower extremity pain: Secondary | ICD-10-CM | POA: Diagnosis not present

## 2024-02-16 NOTE — Patient Instructions (Addendum)
 Good to see you  Left sided Epidural CSI L5-S1 ordered  You can call [name] to schedule at [phone] Call and schedule follow up 2 weeks after epidural injection

## 2024-02-22 NOTE — Discharge Instructions (Signed)

## 2024-02-23 ENCOUNTER — Ambulatory Visit
Admission: RE | Admit: 2024-02-23 | Discharge: 2024-02-23 | Disposition: A | Discharge status: Home / Self Care | Source: Ambulatory Visit | Attending: Sports Medicine | Admitting: Sports Medicine

## 2024-02-23 DIAGNOSIS — M4126 Other idiopathic scoliosis, lumbar region: Secondary | ICD-10-CM

## 2024-02-23 DIAGNOSIS — M48061 Spinal stenosis, lumbar region without neurogenic claudication: Secondary | ICD-10-CM | POA: Diagnosis not present

## 2024-02-23 DIAGNOSIS — G8929 Other chronic pain: Secondary | ICD-10-CM

## 2024-02-23 DIAGNOSIS — M4807 Spinal stenosis, lumbosacral region: Secondary | ICD-10-CM | POA: Diagnosis not present

## 2024-02-23 DIAGNOSIS — M4727 Other spondylosis with radiculopathy, lumbosacral region: Secondary | ICD-10-CM | POA: Diagnosis not present

## 2024-02-23 DIAGNOSIS — M51362 Other intervertebral disc degeneration, lumbar region with discogenic back pain and lower extremity pain: Secondary | ICD-10-CM

## 2024-02-23 MED ORDER — METHYLPREDNISOLONE ACETATE 40 MG/ML INJ SUSP (RADIOLOG
80.0000 mg | Freq: Once | INTRAMUSCULAR | Status: AC
  Administered 2024-02-23: 80 mg via EPIDURAL

## 2024-02-23 MED ORDER — IOPAMIDOL (ISOVUE-M 200) INJECTION 41%
1.0000 mL | Freq: Once | INTRAMUSCULAR | Status: AC
  Administered 2024-02-23: 1 mL via EPIDURAL

## 2024-02-24 ENCOUNTER — Encounter: Payer: Self-pay | Admitting: Internal Medicine

## 2024-02-29 NOTE — Progress Notes (Unsigned)
    Ben Jackson D.[facility] [address] [place] [address] Phone: [phone]   Assessment and Plan:     There are no diagnoses linked to this encounter.  ***   Pertinent previous records reviewed include ***    Follow Up: ***     Subjective:   I, [name], am serving as a Neurosurgeon for Doctor Morene Mace   Chief Complaint: back pain    HPI:    01/26/24 Patient is a 73 year old female with back pain. Patient states last month she was moving a pot and flared her low back. Pain is radiating to the left side. Prednisone  didn't help much .  Saw PCP 01/13/2024 Approximately three weeks ago she was doing yard work and lifted something heavy and has had lower back pain since then.  Initially pain was across the lower back and now it is concentrated in the left lower back.  She has been taking Tylenol  and ibuprofen .  They have helped, but her pain is still there.   No radiating pain.     She has difficulty lifting her left leg because it increases the pain.  As so it hurts more to sit - she feels it through to her abdomen.  She had a sharp pain across her lower abdomen for a little while.    02/16/2024 Patient states the nerve type pain has gotten a little better, but she has been a ton of tylenol  and ibuprofen . States she stopped taking it yesterday and she started feeling the pain in her back and leg and she has aching pain in her heel  03/08/2024 Patient states   Relevant Historical Information: Hypertension, osteoporosis  Additional pertinent review of systems negative.   Current Outpatient Medications:    ALPRAZolam  (XANAX ) 0.25 MG tablet, TAKE 1 TABLET(0.25 MG) BY MOUTH TWICE DAILY AS NEEDED FOR ANXIETY, Disp: 30 tablet, Rfl: 0   amLODipine  (NORVASC ) 10 MG tablet, TAKE 1 TABLET(10 MG) BY MOUTH DAILY, Disp: 90 tablet, Rfl: 2   CALCIUM  CITRATE PO, Take by mouth., Disp: , Rfl:    cetirizine (ZYRTEC) 10 MG tablet, Take 10 mg by  mouth daily., Disp: , Rfl:    Cholecalciferol  (VITAMIN D -3 PO), Take by mouth., Disp: , Rfl:    hydrochlorothiazide  (HYDRODIURIL ) 12.5 MG tablet, TAKE 1 TABLET(12.5 MG) BY MOUTH DAILY, Disp: 90 tablet, Rfl: 3   pravastatin  (PRAVACHOL ) 40 MG tablet, TAKE 1 TABLET(40 MG) BY MOUTH DAILY, Disp: 90 tablet, Rfl: 2   Objective:     There were no vitals filed for this visit.    There is no height or weight on file to calculate BMI.    Physical Exam:    ***   Electronically signed by:  Odis Mace D.[facility] 8:37 AM 02/29/24

## 2024-03-03 ENCOUNTER — Encounter: Payer: Self-pay | Admitting: Internal Medicine

## 2024-03-08 ENCOUNTER — Ambulatory Visit: Payer: Self-pay | Admitting: Sports Medicine

## 2024-03-08 ENCOUNTER — Ambulatory Visit: Admitting: Sports Medicine

## 2024-03-08 VITALS — HR 74 | Ht <= 58 in | Wt 117.0 lb

## 2024-03-08 DIAGNOSIS — M4126 Other idiopathic scoliosis, lumbar region: Secondary | ICD-10-CM | POA: Diagnosis not present

## 2024-03-08 DIAGNOSIS — M51362 Other intervertebral disc degeneration, lumbar region with discogenic back pain and lower extremity pain: Secondary | ICD-10-CM

## 2024-03-08 DIAGNOSIS — M2550 Pain in unspecified joint: Secondary | ICD-10-CM

## 2024-03-08 DIAGNOSIS — R202 Paresthesia of skin: Secondary | ICD-10-CM

## 2024-03-08 DIAGNOSIS — E559 Vitamin D deficiency, unspecified: Secondary | ICD-10-CM

## 2024-03-08 DIAGNOSIS — G8929 Other chronic pain: Secondary | ICD-10-CM | POA: Diagnosis not present

## 2024-03-08 LAB — CBC WITH DIFFERENTIAL/PLATELET
Basophils Absolute: 0 10*3/uL (ref 0.0–0.1)
Basophils Relative: 0.4 % (ref 0.0–3.0)
Eosinophils Absolute: 0.1 10*3/uL (ref 0.0–0.7)
Eosinophils Relative: 1.1 % (ref 0.0–5.0)
HCT: 37.9 % (ref 36.0–46.0)
Hemoglobin: 13.1 g/dL (ref 12.0–15.0)
Lymphocytes Relative: 21.6 % (ref 12.0–46.0)
Lymphs Abs: 1.6 10*3/uL (ref 0.7–4.0)
MCHC: 34.6 g/dL (ref 30.0–36.0)
MCV: 89.9 fl (ref 78.0–100.0)
Monocytes Absolute: 0.6 10*3/uL (ref 0.1–1.0)
Monocytes Relative: 7.9 % (ref 3.0–12.0)
Neutro Abs: 5 10*3/uL (ref 1.4–7.7)
Neutrophils Relative %: 69 % (ref 43.0–77.0)
Platelets: 211 10*3/uL (ref 150.0–400.0)
RBC: 4.22 Mil/uL (ref 3.87–5.11)
RDW: 13.9 % (ref 11.5–15.5)
WBC: 7.2 10*3/uL (ref 4.0–10.5)

## 2024-03-08 LAB — VITAMIN B12: Vitamin B-12: 313 pg/mL (ref 211–911)

## 2024-03-08 LAB — COMPREHENSIVE METABOLIC PANEL WITH GFR
ALT: 13 U/L (ref 0–35)
AST: 19 U/L (ref 0–37)
Albumin: 4.9 g/dL (ref 3.5–5.2)
Alkaline Phosphatase: 63 U/L (ref 39–117)
BUN: 12 mg/dL (ref 6–23)
CO2: 30 meq/L (ref 19–32)
Calcium: 9.6 mg/dL (ref 8.4–10.5)
Chloride: 90 meq/L — ABNORMAL LOW (ref 96–112)
Creatinine, Ser: 0.69 mg/dL (ref 0.40–1.20)
GFR: 87.61 mL/min (ref >60.00)
Glucose, Bld: 96 mg/dL (ref 70–99)
Potassium: 3.6 meq/L (ref 3.5–5.1)
Sodium: 129 meq/L — ABNORMAL LOW (ref 135–145)
Total Bilirubin: 0.7 mg/dL (ref 0.2–1.2)
Total Protein: 6.9 g/dL (ref 6.0–8.3)

## 2024-03-08 LAB — TSH: TSH: 1.57 u[IU]/mL (ref 0.35–5.50)

## 2024-03-08 LAB — VITAMIN D 25 HYDROXY (VIT D DEFICIENCY, FRACTURES): VITD: 37.67 ng/mL (ref 30.00–100.00)

## 2024-03-08 NOTE — Patient Instructions (Addendum)
 Repeat epidural left l5-S1  Labs on the way out  Follow up 2 weeks after injection to discuss results

## 2024-03-20 NOTE — Discharge Instructions (Signed)

## 2024-03-21 ENCOUNTER — Ambulatory Visit
Admission: RE | Admit: 2024-03-21 | Discharge: 2024-03-21 | Disposition: A | Discharge status: Home / Self Care | Source: Ambulatory Visit | Attending: Sports Medicine | Admitting: Sports Medicine

## 2024-03-21 DIAGNOSIS — M4727 Other spondylosis with radiculopathy, lumbosacral region: Secondary | ICD-10-CM | POA: Diagnosis not present

## 2024-03-21 DIAGNOSIS — G8929 Other chronic pain: Secondary | ICD-10-CM

## 2024-03-21 DIAGNOSIS — M51362 Other intervertebral disc degeneration, lumbar region with discogenic back pain and lower extremity pain: Secondary | ICD-10-CM

## 2024-03-21 DIAGNOSIS — M4126 Other idiopathic scoliosis, lumbar region: Secondary | ICD-10-CM

## 2024-03-21 MED ORDER — IOPAMIDOL (ISOVUE-M 200) INJECTION 41%
1.0000 mL | Freq: Once | INTRAMUSCULAR | Status: AC
  Administered 2024-03-21: 1 mL via EPIDURAL

## 2024-03-21 MED ORDER — METHYLPREDNISOLONE ACETATE 40 MG/ML INJ SUSP (RADIOLOG
80.0000 mg | Freq: Once | INTRAMUSCULAR | Status: AC
  Administered 2024-03-21: 80 mg via EPIDURAL

## 2024-04-03 ENCOUNTER — Encounter: Payer: Self-pay | Admitting: Internal Medicine

## 2024-04-03 NOTE — Progress Notes (Unsigned)
    Ben Jackson D.[facility] [address] [place] [address] Phone: [phone]   Assessment and Plan:     There are no diagnoses linked to this encounter.  ***   Pertinent previous records reviewed include ***    Follow Up: ***     Subjective:   I, Aparna Vanderweele, am serving as a Neurosurgeon for Doctor Morene Mace   Chief Complaint: back pain    HPI:    01/26/24 Patient is a 73 year old female with back pain. Patient states last month she was moving a pot and flared her low back. Pain is radiating to the left side. Prednisone  didn't help much .  Saw PCP 01/13/2024 Approximately three weeks ago she was doing yard work and lifted something heavy and has had lower back pain since then.  Initially pain was across the lower back and now it is concentrated in the left lower back.  She has been taking Tylenol  and ibuprofen .  They have helped, but her pain is still there.   No radiating pain.     She has difficulty lifting her left leg because it increases the pain.  As so it hurts more to sit - she feels it through to her abdomen.  She had a sharp pain across her lower abdomen for a little while.    02/16/2024 Patient states the nerve type pain has gotten a little better, but she has been a ton of tylenol  and ibuprofen . States she stopped taking it yesterday and she started feeling the pain in her back and leg and she has aching pain in her heel   03/08/2024 Patient states no more sharp pain but still achy. States her legs feel weird. 50% relief   04/04/2024 Patient states   Relevant Historical Information: Hypertension, osteoporosis  Current Outpatient Medications:    ALPRAZolam  (XANAX ) 0.25 MG tablet, TAKE 1 TABLET(0.25 MG) BY MOUTH TWICE DAILY AS NEEDED FOR ANXIETY, Disp: 30 tablet, Rfl: 0   amLODipine  (NORVASC ) 10 MG tablet, TAKE 1 TABLET(10 MG) BY MOUTH DAILY, Disp: 90 tablet, Rfl: 2   CALCIUM  CITRATE PO, Take by mouth., Disp: , Rfl:     cetirizine (ZYRTEC) 10 MG tablet, Take 10 mg by mouth daily., Disp: , Rfl:    Cholecalciferol  (VITAMIN D -3 PO), Take by mouth., Disp: , Rfl:    hydrochlorothiazide  (HYDRODIURIL ) 12.5 MG tablet, TAKE 1 TABLET(12.5 MG) BY MOUTH DAILY, Disp: 90 tablet, Rfl: 3   pravastatin  (PRAVACHOL ) 40 MG tablet, TAKE 1 TABLET(40 MG) BY MOUTH DAILY, Disp: 90 tablet, Rfl: 2   Objective:     There were no vitals filed for this visit.    There is no height or weight on file to calculate BMI.    Physical Exam:    ***   Electronically signed by:  Odis Mace D.[facility] 12:47 PM 04/03/24

## 2024-04-04 ENCOUNTER — Other Ambulatory Visit: Payer: Self-pay

## 2024-04-04 ENCOUNTER — Ambulatory Visit (INDEPENDENT_AMBULATORY_CARE_PROVIDER_SITE_OTHER): Admitting: Sports Medicine

## 2024-04-04 ENCOUNTER — Ambulatory Visit (INDEPENDENT_AMBULATORY_CARE_PROVIDER_SITE_OTHER): Admitting: Internal Medicine

## 2024-04-04 ENCOUNTER — Encounter: Payer: Self-pay | Admitting: Internal Medicine

## 2024-04-04 VITALS — BP 144/70 | HR 70 | Temp 98.2°F | Ht <= 58 in | Wt 117.0 lb

## 2024-04-04 VITALS — HR 72 | Ht <= 58 in | Wt 117.0 lb

## 2024-04-04 DIAGNOSIS — M25511 Pain in right shoulder: Secondary | ICD-10-CM

## 2024-04-04 DIAGNOSIS — G8929 Other chronic pain: Secondary | ICD-10-CM | POA: Diagnosis not present

## 2024-04-04 DIAGNOSIS — I10 Essential (primary) hypertension: Secondary | ICD-10-CM | POA: Diagnosis not present

## 2024-04-04 DIAGNOSIS — M51362 Other intervertebral disc degeneration, lumbar region with discogenic back pain and lower extremity pain: Secondary | ICD-10-CM

## 2024-04-04 DIAGNOSIS — M4126 Other idiopathic scoliosis, lumbar region: Secondary | ICD-10-CM

## 2024-04-04 DIAGNOSIS — M79672 Pain in left foot: Secondary | ICD-10-CM

## 2024-04-04 DIAGNOSIS — R300 Dysuria: Secondary | ICD-10-CM

## 2024-04-04 DIAGNOSIS — M7551 Bursitis of right shoulder: Secondary | ICD-10-CM

## 2024-04-04 LAB — POC URINALSYSI DIPSTICK (AUTOMATED)
Bilirubin, UA: NEGATIVE
Glucose, UA: NEGATIVE
Ketones, UA: NEGATIVE
Leukocytes, UA: NEGATIVE
Nitrite, UA: NEGATIVE
Protein, UA: NEGATIVE
Spec Grav, UA: 1.015 (ref 1.010–1.025)
Urobilinogen, UA: 0.2 U/dL
pH, UA: 7 (ref 5.0–8.0)

## 2024-04-04 NOTE — Assessment & Plan Note (Signed)
 Acute History of urinary tract infections Urine dip here negative for infection Will send urine for culture Symptomatic treatment until culture results come back

## 2024-04-04 NOTE — Patient Instructions (Addendum)
    Urine does not show an infection - we will send the urine for culture    Medications changes include :   None     Return if symptoms worsen or fail to improve.

## 2024-04-04 NOTE — Patient Instructions (Addendum)
 Shoulder HEP  Voltaren  gel over areas of pain  PT referral  8 week follow up

## 2024-04-04 NOTE — Assessment & Plan Note (Signed)
 Chronic Blood pressure slightly elevated here today No change in medications today Monitor BP Continue amlodipine  10 mg daily, hctz 12.5 mg daily

## 2024-04-04 NOTE — Progress Notes (Signed)
    Subjective:    Patient ID: [name], female    DOB: Aug 11, 1951, 73 y.o.   MRN: [id number]      HPI [name] is here for  Chief Complaint  Patient presents with   Urinary Tract Infection    Burning with urination and frequency    ? UTI:  Her symptoms started 4 days ago.  She states dysuria - twinges of pain, urinary frequency, urinary urgency, urinary incontinence and suprapubic aching.    Her symptoms are better today.  She denies hematuria, abdominal pain, back pain, nausea, fever.      Medications and allergies reviewed with patient and updated if appropriate.  Current Outpatient Medications on File Prior to Visit  Medication Sig Dispense Refill   ALPRAZolam  (XANAX ) 0.25 MG tablet TAKE 1 TABLET(0.25 MG) BY MOUTH TWICE DAILY AS NEEDED FOR ANXIETY 30 tablet 0   amLODipine  (NORVASC ) 10 MG tablet TAKE 1 TABLET(10 MG) BY MOUTH DAILY 90 tablet 2   CALCIUM  CITRATE PO Take by mouth.     cetirizine (ZYRTEC) 10 MG tablet Take 10 mg by mouth daily.     Cholecalciferol  (VITAMIN D -3 PO) Take by mouth.     hydrochlorothiazide  (HYDRODIURIL ) 12.5 MG tablet TAKE 1 TABLET(12.5 MG) BY MOUTH DAILY 90 tablet 3   pravastatin  (PRAVACHOL ) 40 MG tablet TAKE 1 TABLET(40 MG) BY MOUTH DAILY 90 tablet 2   No current facility-administered medications on file prior to visit.    Review of Systems     Objective:   Vitals:   04/04/24 1512  BP: (!) 144/70  Pulse: 70  Temp: 98.2 F (36.8 C)  SpO2: 96%   BP Readings from Last 3 Encounters:  04/04/24 (!) 144/70  03/21/24 138/65  02/23/24 (!) 162/62   Wt Readings from Last 3 Encounters:  04/04/24 117 lb (53.1 kg)  04/04/24 117 lb (53.1 kg)  03/08/24 117 lb (53.1 kg)   Body mass index is 24.45 kg/m.    Physical Exam Constitutional:      General: She is not in acute distress.    Appearance: Normal appearance. She is not ill-appearing.  HENT:     Head: Normocephalic.  Eyes:     Conjunctiva/sclera: Conjunctivae normal.   Abdominal:     General: There is no distension.     Palpations: Abdomen is soft.     Tenderness: There is abdominal tenderness (suprapubic twinge with pressure). There is no right CVA tenderness, left CVA tenderness, guarding or rebound.  Skin:    General: Skin is warm and dry.  Neurological:     Mental Status: She is alert.            Assessment & Plan:    See Problem List for Assessment and Plan of chronic medical problems.

## 2024-04-06 LAB — CULTURE, URINE COMPREHENSIVE: RESULT:: NO GROWTH

## 2024-04-07 ENCOUNTER — Ambulatory Visit: Payer: Self-pay | Admitting: Internal Medicine

## 2024-04-07 DIAGNOSIS — R350 Frequency of micturition: Secondary | ICD-10-CM

## 2024-04-14 ENCOUNTER — Encounter: Payer: Self-pay | Admitting: Sports Medicine

## 2024-04-24 NOTE — Progress Notes (Unsigned)
    Ben Jackson D.[facility] [address] [place] [address] Phone: [phone]   Assessment and Plan:     There are no diagnoses linked to this encounter.  ***   Pertinent previous records reviewed include ***    Follow Up: ***     Subjective:    Chief Complaint: ***  HPI:   01/26/24 Patient is a 73 year old female with back pain. Patient states last month she was moving a pot and flared her low back. Pain is radiating to the left side. Prednisone  didn't help much .  Saw PCP 01/13/2024 Approximately three weeks ago she was doing yard work and lifted something heavy and has had lower back pain since then.  Initially pain was across the lower back and now it is concentrated in the left lower back.  She has been taking Tylenol  and ibuprofen .  They have helped, but her pain is still there.   No radiating pain.     She has difficulty lifting her left leg because it increases the pain.  As so it hurts more to sit - she feels it through to her abdomen.  She had a sharp pain across her lower abdomen for a little while.    02/16/2024 Patient states the nerve type pain has gotten a little better, but she has been a ton of tylenol  and ibuprofen . States she stopped taking it yesterday and she started feeling the pain in her back and leg and she has aching pain in her heel   03/08/2024 Patient states no more sharp pain but still achy. States her legs feel weird. 50% relief    04/04/2024 Patient states she is doing good, feeling better. R shoulder pain is back , feels like something is broken. Left heel is in pain    04/24/24 Patient states   Relevant Historical Information: Hypertension, osteoporosis  Additional pertinent review of systems negative.   Current Outpatient Medications:    ALPRAZolam  (XANAX ) 0.25 MG tablet, TAKE 1 TABLET(0.25 MG) BY MOUTH TWICE DAILY AS NEEDED FOR ANXIETY, Disp: 30 tablet, Rfl: 0   amLODipine  (NORVASC ) 10 MG  tablet, TAKE 1 TABLET(10 MG) BY MOUTH DAILY, Disp: 90 tablet, Rfl: 2   CALCIUM  CITRATE PO, Take by mouth., Disp: , Rfl:    cetirizine (ZYRTEC) 10 MG tablet, Take 10 mg by mouth daily., Disp: , Rfl:    Cholecalciferol  (VITAMIN D -3 PO), Take by mouth., Disp: , Rfl:    hydrochlorothiazide  (HYDRODIURIL ) 12.5 MG tablet, TAKE 1 TABLET(12.5 MG) BY MOUTH DAILY, Disp: 90 tablet, Rfl: 3   pravastatin  (PRAVACHOL ) 40 MG tablet, TAKE 1 TABLET(40 MG) BY MOUTH DAILY, Disp: 90 tablet, Rfl: 2   Objective:     There were no vitals filed for this visit.    There is no height or weight on file to calculate BMI.    Physical Exam:    ***   Electronically signed by:  Odis Mace D[facility] 9:17 AM 04/24/24

## 2024-04-25 ENCOUNTER — Ambulatory Visit (INDEPENDENT_AMBULATORY_CARE_PROVIDER_SITE_OTHER): Admitting: Sports Medicine

## 2024-04-25 VITALS — BP 130/60 | HR 77 | Ht <= 58 in

## 2024-04-25 DIAGNOSIS — M25511 Pain in right shoulder: Secondary | ICD-10-CM | POA: Diagnosis not present

## 2024-04-25 DIAGNOSIS — G8929 Other chronic pain: Secondary | ICD-10-CM | POA: Diagnosis not present

## 2024-04-25 DIAGNOSIS — M5442 Lumbago with sciatica, left side: Secondary | ICD-10-CM

## 2024-04-25 DIAGNOSIS — M7551 Bursitis of right shoulder: Secondary | ICD-10-CM

## 2024-04-25 NOTE — Patient Instructions (Signed)
 PT with Elaine. Follow up in 2 months.

## 2024-05-01 ENCOUNTER — Ambulatory Visit (INDEPENDENT_AMBULATORY_CARE_PROVIDER_SITE_OTHER): Admitting: Physical Therapy

## 2024-05-01 ENCOUNTER — Encounter: Payer: Self-pay | Admitting: Physical Therapy

## 2024-05-01 ENCOUNTER — Other Ambulatory Visit: Payer: Self-pay

## 2024-05-01 DIAGNOSIS — G8929 Other chronic pain: Secondary | ICD-10-CM

## 2024-05-01 DIAGNOSIS — M25511 Pain in right shoulder: Secondary | ICD-10-CM

## 2024-05-01 DIAGNOSIS — M5459 Other low back pain: Secondary | ICD-10-CM | POA: Diagnosis not present

## 2024-05-01 DIAGNOSIS — M25561 Pain in right knee: Secondary | ICD-10-CM | POA: Diagnosis not present

## 2024-05-01 DIAGNOSIS — M6281 Muscle weakness (generalized): Secondary | ICD-10-CM | POA: Diagnosis not present

## 2024-05-01 DIAGNOSIS — M25562 Pain in left knee: Secondary | ICD-10-CM | POA: Diagnosis not present

## 2024-05-01 DIAGNOSIS — M79672 Pain in left foot: Secondary | ICD-10-CM

## 2024-05-01 NOTE — Patient Instructions (Signed)
 Access Code: [email or web address] URL: [email or web address] Date: 05/01/2024 Prepared by: Elaine Daring  Exercises - Marching Bridge  - 1 x daily - 2 sets - 5 reps - Clam with Resistance  - 1 x daily - 2 sets - 10 reps - Standing Row with Anchored Resistance  - 1 x daily - 2 sets - 10 reps - Standing Gastroc Stretch at Counter  - 1 x daily - 3 reps - 20 seconds hold

## 2024-05-01 NOTE — Therapy (Signed)
 OUTPATIENT PHYSICAL THERAPY EVALUATION   Patient Name: [name] MRN: [id number] DOB:Oct 02, 1950, 73 y.o., female Today's Date: 05/01/2024   END OF SESSION:  PT End of Session - 05/01/24 1507     Visit Number 1    Number of Visits 9    Date for PT Re-Evaluation 06/26/24    Authorization Type MCR    Progress Note Due on Visit 10    PT Start Time 1300    PT Stop Time 1348    PT Time Calculation (min) 48 min    Activity Tolerance Patient tolerated treatment well    Behavior During Therapy WFL for tasks assessed/performed          Past Medical History:  Diagnosis Date   ALLERGIC RHINITIS    ANEMIA, PERNICIOUS    ANXIETY    BILIARY DYSKINESIA    CARPAL TUNNEL SYNDROME, LEFT    DYSLIPIDEMIA    Headache(784.0)    HYPERTENSION    Irritable bowel syndrome    Non-celiac gluten sensitivity    OSTEOPENIA    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HCC)    borderline   Past Surgical History:  Procedure Laterality Date   APPENDECTOMY     CARPAL TUNNEL RELEASE     CESAREAN SECTION  74 & 83   x's 2    CHOLECYSTECTOMY  2005   Dr. Gail (Gallbladder dyskinesia)   COLONOSCOPY     DILATION AND CURETTAGE OF UTERUS     LEFT HEART CATHETERIZATION WITH CORONARY ANGIOGRAM N/A 05/19/2012   Procedure: LEFT HEART CATHETERIZATION WITH CORONARY ANGIOGRAM;  Surgeon: Debby JONETTA Como, MD;  Location: [facility] CATH LAB;  Service: Cardiovascular;  Laterality: N/A;   ROTATOR CUFF REPAIR     TONSILLECTOMY     UPPER GASTROINTESTINAL ENDOSCOPY     Patient Active Problem List   Diagnosis Date Noted   Abdominal bloating 01/13/2024   Acute left-sided low back pain without sciatica 01/12/2024   Eczema 07/06/2023   Abnormal cervical Papanicolaou smear 07/06/2023   Neck pain on left side 05/21/2023   Acute pain of right shoulder 04/09/2023   Petechial rash 01/04/2023   Macular degeneration, early 07/10/2022   Palpitations 07/03/2022   Baker cyst, left 06/05/2022   Chronic low back pain 06/05/2022    Arthralgia 05/20/2022   Gait abnormality 05/14/2022   Thiamine  deficiency 05/14/2022   Reactive arthritis of right knee (HCC) 04/14/2022   Aortic atherosclerosis (HCC) 04/06/2022   Myalgia 04/06/2022   Arthralgia of both knees 04/06/2022   Salivary gland swelling 12/02/2020   Fall 02/13/2020   Left arm pain 02/13/2020   Hyperglycemia 06/07/2019   DDD (degenerative disc disease), cervical 02/02/2019   Dysuria 12/12/2018   Musculoskeletal neck pain 10/14/2018   Diverticulitis 08/11/2018   Shoulder pain, left 02/23/2017   B12 deficiency 08/07/2016   Recurrent UTI 11/08/2015   Non-celiac gluten sensitivity    Urinary frequency 01/10/2015   Anxiety state 01/17/2010   Allergic rhinitis 01/17/2010   Dyslipidemia 07/12/2009   Essential hypertension 07/12/2009   Osteoporosis 06/22/2008   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   ANEMIA, PERNICIOUS 10/14/2007   IRRITABLE BOWEL SYNDROME 10/14/2007    PCP: Geofm Glade JINNY, MD  REFERRING PROVIDER: Leonce Katz, DO  REFERRING DIAG: Chronic right shoulder pain; Subacromial bursitis of right shoulder joint; Chronic bilateral low back pain with left-sided sciatica  THERAPY DIAG:  Chronic right shoulder pain  Other low back pain  Pain in left foot  Chronic pain of both knees  Muscle weakness (generalized)  Rationale for Evaluation and Treatment: Rehabilitation  ONSET DATE: Chronic   SUBJECTIVE SUBJECTIVE STATEMENT: Patient was seeing the doctor for her right shoulder and lower back, but is also reporting pain in her left heel today and her knees hurt. Regarding her right shoulder she was playing a pool game and injured the right shoulder a few years ago. She did have a shot to sort of took care of the right shoulder but then she reached back in her car and felt a pop that was painful. She has also had two injections in her back which has helped but did not get rid of it completely.   Hand dominance:  Right  PERTINENT HISTORY: See PMH above  PAIN:  Are you having pain? Yes:  NPRS scale: 0/10, 6/10 at worst Pain location: Right shoulder Pain description: Sharp Aggravating factors: Moving the shoulder in certain directions Relieving factors: Rest  NPRS scale: 3-4/10 Pain location: Lower back Pain description: Ache Aggravating factors: Sitting, squatting down or stooping Relieving factors: Rest  NPRS scale: 5/10 Pain location: Left heel Pain description: step on a stone Aggravating factors: Walking Relieving factors: Rest  PRECAUTIONS: None  RED FLAGS: None   WEIGHT BEARING RESTRICTIONS: No  FALLS:  Has patient fallen in last 6 months? No  PLOF: Independent  PATIENT GOALS: Pain relief   OBJECTIVE:  Note: Objective measures were completed at Evaluation unless otherwise noted. PATIENT SURVEYS:  PSFS: 8.2 Getting up from sitting/ lower back aches: 9 Sometimes walking up stairs: 8 Opening jars: 8 Carrying laundry basket upstairs: 9 Working in garden: 7  COGNITION: Overall cognitive status: Within functional limits for tasks assessed     SENSATION: WFL  POSTURE: Rounded shoulder posture  UPPER EXTREMITY ROM:   Active ROM Right eval Left eval  Shoulder flexion 160 160  Shoulder extension    Shoulder abduction 170 170  Shoulder adduction    Shoulder internal rotation T7 T7  Shoulder external rotation 65 / T2 70 / T2  Elbow flexion    Elbow extension    Wrist flexion    Wrist extension    Wrist ulnar deviation    Wrist radial deviation    Wrist pronation    Wrist supination    (Blank rows = not tested)  UPPER EXTREMITY MMT:  MMT Right eval Left eval  Shoulder flexion 4+ 5  Shoulder extension 5 5  Shoulder abduction 5 5  Shoulder adduction    Shoulder internal rotation 5 5  Shoulder external rotation 4+ 5  Middle trapezius    Lower trapezius    Elbow flexion 5 5  Elbow extension    Wrist flexion    Wrist extension    Wrist  ulnar deviation    Wrist radial deviation    Wrist pronation    Wrist supination    Grip strength (lbs)    (Blank rows = not tested)  LUMBAR ROM:   Active  A/PROM  eval  Flexion WFL  Extension 75%  Right lateral flexion WFL  Left lateral flexion WFL  Right rotation WFL  Left rotation WFL   (Blank rows = not tested)  LOWER EXTREMITY MMT:    MMT Right eval Left eval  Hip flexion    Hip extension 4- 4-  Hip abduction 4- 4-  Hip adduction    Hip internal rotation    Hip external rotation    Knee flexion    Knee extension    Ankle dorsiflexion    Ankle plantarflexion 5  4  Ankle inversion    Ankle eversion     (Blank rows = not tested)   JOINT MOBILITY TESTING:  Not assessed  PALPATION:  Tender to right upper trap region                                                                                                                             TREATMENT [facility] Adult PT Treatment:                                                DATE: 05/01/2024 Marching bridge x 5 Side clamshell with red x 10 each Row with green x 10 Standing calf stretch at counter 2 x 30 sec each  Spent time discussing patient's exercise routine of walking on treadmill and exercise classes including tai chi.   PATIENT EDUCATION: Education details: Exam findings, POC, HEP Person educated: Patient Education method: Explanation, Demonstration, Tactile cues, Verbal cues, and Handouts Education comprehension: verbalized understanding, returned demonstration, verbal cues required, tactile cues required, and needs further education  HOME EXERCISE PROGRAM: Access Code: [email or web address]   ASSESSMENT: CLINICAL IMPRESSION: Patient is a 73 y.o. female who was seen today for physical therapy evaluation and treatment for right shoulder, lower back, left heel, and bilateral knee pain. Her primary complaint of pain today seemed to be her right heel from when she was walking on the treadmill earlier. She does exhibit  some calf tightness and weakness. She also exhibit strength deficit of the right shoulder and hips that are likely contributing to her symptoms. Her back and right shoulder move well but she does report some tightness and tenderness of the right upper trap region.  OBJECTIVE IMPAIRMENTS: decreased activity tolerance, decreased ROM, decreased strength, impaired flexibility, postural dysfunction, and pain.   ACTIVITY LIMITATIONS: lifting, bending, sitting, squatting, reach over head, and locomotion level  PARTICIPATION LIMITATIONS: meal prep, cleaning, community activity, and yard work  PERSONAL FACTORS: Fitness, Past/current experiences, and Time since onset of injury/illness/exacerbation are also affecting patient's functional outcome.   REHAB POTENTIAL: Good  CLINICAL DECISION MAKING: Stable/uncomplicated  EVALUATION COMPLEXITY: Low   GOALS: Goals reviewed with patient? Yes  SHORT TERM GOALS: Target date: 05/29/2024  Patient will be I with initial HEP in order to progress with therapy. Baseline: HEP provided at eval Goal status: INITIAL  2.  Patient will report pain level with activity </= 1/10 in order to reduce functional limitations Baseline: see pain score above Goal status: INITIAL  LONG TERM GOALS: Target date: 06/26/2024  Patient will be I with final HEP to maintain progress from PT. Baseline: HEP provided at eval Goal status: INITIAL  2.  Patient will report PSFS >/= 9 in order to indicate improvement in their functional ability. Baseline: 8.2 Goal status: INITIAL  3.  Patient will demonstrate hip strength grossly >/=  4/5 MMT to improve activity tolerance and reduce pain Baseline: grossly 4-/5 MMT Goal status: INITIAL  4.  Patient will demonstrate right shoulder strength 5/5 MMT in order to improve activity tolerance with right shoulder Baseline: see limitations above Goal status: INITIAL   PLAN: PT FREQUENCY: 1x/week  PT DURATION: 8 weeks  PLANNED  INTERVENTIONS: 97164- PT Re-evaluation, 97750- Physical Performance Testing, 97110-Therapeutic exercises, 97530- Therapeutic activity, 97112- Neuromuscular re-education, 97535- Self Care, 02859- Manual therapy, Patient/Family education, Balance training, Stair training, Taping, Joint mobilization, Joint manipulation, Spinal manipulation, Spinal mobilization, Cryotherapy, and Moist heat  PLAN FOR NEXT SESSION: Review HEP and progress PRN; manual for right upper trap region; strengthening for right shoulder, core, hips, and LE; postural control; dynamic balance   Elaine Daring, PT, DPT, LAT, ATC 05/01/24  3:13 PM Phone: [phone] Fax: [phone]

## 2024-05-03 ENCOUNTER — Ambulatory Visit: Admitting: Sports Medicine

## 2024-05-15 ENCOUNTER — Ambulatory Visit (INDEPENDENT_AMBULATORY_CARE_PROVIDER_SITE_OTHER): Admitting: Physical Therapy

## 2024-05-15 ENCOUNTER — Other Ambulatory Visit: Payer: Self-pay

## 2024-05-15 ENCOUNTER — Encounter: Payer: Self-pay | Admitting: Physical Therapy

## 2024-05-15 DIAGNOSIS — M25561 Pain in right knee: Secondary | ICD-10-CM | POA: Diagnosis not present

## 2024-05-15 DIAGNOSIS — M6281 Muscle weakness (generalized): Secondary | ICD-10-CM

## 2024-05-15 DIAGNOSIS — M79672 Pain in left foot: Secondary | ICD-10-CM

## 2024-05-15 DIAGNOSIS — G8929 Other chronic pain: Secondary | ICD-10-CM

## 2024-05-15 DIAGNOSIS — M5459 Other low back pain: Secondary | ICD-10-CM | POA: Diagnosis not present

## 2024-05-15 DIAGNOSIS — M25562 Pain in left knee: Secondary | ICD-10-CM | POA: Diagnosis not present

## 2024-05-15 DIAGNOSIS — M25511 Pain in right shoulder: Secondary | ICD-10-CM

## 2024-05-15 NOTE — Therapy (Signed)
 OUTPATIENT PHYSICAL THERAPY TREATMENT   Patient Name: [name] MRN: [id number] DOB:1951/06/22, 73 y.o., female Today's Date: 05/15/2024   END OF SESSION:  PT End of Session - 05/15/24 1425     Visit Number 2    Number of Visits 9    Date for PT Re-Evaluation 06/26/24    Authorization Type MCR    Progress Note Due on Visit 10    PT Start Time 1300    PT Stop Time 1340    PT Time Calculation (min) 40 min    Activity Tolerance Patient tolerated treatment well    Behavior During Therapy WFL for tasks assessed/performed           Past Medical History:  Diagnosis Date   ALLERGIC RHINITIS    ANEMIA, PERNICIOUS    ANXIETY    BILIARY DYSKINESIA    CARPAL TUNNEL SYNDROME, LEFT    DYSLIPIDEMIA    Headache(784.0)    HYPERTENSION    Irritable bowel syndrome    Non-celiac gluten sensitivity    OSTEOPENIA    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HCC)    borderline   Past Surgical History:  Procedure Laterality Date   APPENDECTOMY     CARPAL TUNNEL RELEASE     CESAREAN SECTION  74 & 83   x's 2    CHOLECYSTECTOMY  2005   Dr. Gail (Gallbladder dyskinesia)   COLONOSCOPY     DILATION AND CURETTAGE OF UTERUS     LEFT HEART CATHETERIZATION WITH CORONARY ANGIOGRAM N/A 05/19/2012   Procedure: LEFT HEART CATHETERIZATION WITH CORONARY ANGIOGRAM;  Surgeon: Debby JONETTA Como, MD;  Location: [facility] CATH LAB;  Service: Cardiovascular;  Laterality: N/A;   ROTATOR CUFF REPAIR     TONSILLECTOMY     UPPER GASTROINTESTINAL ENDOSCOPY     Patient Active Problem List   Diagnosis Date Noted   Abdominal bloating 01/13/2024   Acute left-sided low back pain without sciatica 01/12/2024   Eczema 07/06/2023   Abnormal cervical Papanicolaou smear 07/06/2023   Neck pain on left side 05/21/2023   Acute pain of right shoulder 04/09/2023   Petechial rash 01/04/2023   Macular degeneration, early 07/10/2022   Palpitations 07/03/2022   Baker cyst, left 06/05/2022   Chronic low back pain 06/05/2022    Arthralgia 05/20/2022   Gait abnormality 05/14/2022   Thiamine  deficiency 05/14/2022   Reactive arthritis of right knee (HCC) 04/14/2022   Aortic atherosclerosis (HCC) 04/06/2022   Myalgia 04/06/2022   Arthralgia of both knees 04/06/2022   Salivary gland swelling 12/02/2020   Fall 02/13/2020   Left arm pain 02/13/2020   Hyperglycemia 06/07/2019   DDD (degenerative disc disease), cervical 02/02/2019   Dysuria 12/12/2018   Musculoskeletal neck pain 10/14/2018   Diverticulitis 08/11/2018   Shoulder pain, left 02/23/2017   B12 deficiency 08/07/2016   Recurrent UTI 11/08/2015   Non-celiac gluten sensitivity    Urinary frequency 01/10/2015   Anxiety state 01/17/2010   Allergic rhinitis 01/17/2010   Dyslipidemia 07/12/2009   Essential hypertension 07/12/2009   Osteoporosis 06/22/2008   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   ANEMIA, PERNICIOUS 10/14/2007   IRRITABLE BOWEL SYNDROME 10/14/2007    PCP: Geofm Glade JINNY, MD  REFERRING PROVIDER: Leonce Katz, DO  REFERRING DIAG: Chronic right shoulder pain; Subacromial bursitis of right shoulder joint; Chronic bilateral low back pain with left-sided sciatica  THERAPY DIAG:  Chronic right shoulder pain  Other low back pain  Pain in left foot  Chronic pain of both knees  Muscle weakness (  generalized)  Rationale for Evaluation and Treatment: Rehabilitation  ONSET DATE: Chronic   SUBJECTIVE SUBJECTIVE STATEMENT: Patient reports she is doing well. She is doing her exercises. The only think that still bothers her quite a bit is her left foot, and her low back is quite achy. Her right shoulder has not been bothering her. The pain in the left foot is mainly around the heel.  Eval: Patient was seeing the doctor for her right shoulder and lower back, but is also reporting pain in her left heel today and her knees hurt. Regarding her right shoulder she was playing a pool game and injured the right shoulder a few years  ago. She did have a shot to sort of took care of the right shoulder but then she reached back in her car and felt a pop that was painful. She has also had two injections in her back which has helped but did not get rid of it completely.   Hand dominance: Right  PERTINENT HISTORY: See PMH above  PAIN:  Are you having pain? Yes:  NPRS scale: 0/10, 6/10 at worst Pain location: Right shoulder Pain description: Sharp Aggravating factors: Moving the shoulder in certain directions Relieving factors: Rest  NPRS scale: 3-4/10 Pain location: Lower back Pain description: Ache Aggravating factors: Sitting, squatting down or stooping Relieving factors: Rest  NPRS scale: 5/10 Pain location: Left heel Pain description: step on a stone Aggravating factors: Walking Relieving factors: Rest  PRECAUTIONS: None  PATIENT GOALS: Pain relief   OBJECTIVE:  Note: Objective measures were completed at Evaluation unless otherwise noted. PATIENT SURVEYS:  PSFS: 8.2 Getting up from sitting/ lower back aches: 9 Sometimes walking up stairs: 8 Opening jars: 8 Carrying laundry basket upstairs: 9 Working in garden: 7  POSTURE: Rounded shoulder posture  UPPER EXTREMITY ROM:   Active ROM Right eval Left eval  Shoulder flexion 160 160  Shoulder extension    Shoulder abduction 170 170  Shoulder adduction    Shoulder internal rotation T7 T7  Shoulder external rotation 65 / T2 70 / T2  Elbow flexion    Elbow extension    Wrist flexion    Wrist extension    Wrist ulnar deviation    Wrist radial deviation    Wrist pronation    Wrist supination    (Blank rows = not tested)  UPPER EXTREMITY MMT:  MMT Right eval Left eval  Shoulder flexion 4+ 5  Shoulder extension 5 5  Shoulder abduction 5 5  Shoulder adduction    Shoulder internal rotation 5 5  Shoulder external rotation 4+ 5  Middle trapezius    Lower trapezius    Elbow flexion 5 5  Elbow extension    Wrist flexion    Wrist  extension    Wrist ulnar deviation    Wrist radial deviation    Wrist pronation    Wrist supination    Grip strength (lbs)    (Blank rows = not tested)  LUMBAR ROM:   Active  A/PROM  eval  Flexion WFL  Extension 75%  Right lateral flexion WFL  Left lateral flexion WFL  Right rotation WFL  Left rotation WFL   (Blank rows = not tested)  LOWER EXTREMITY MMT:    MMT Right eval Left eval Left 05/15/2024  Hip flexion     Hip extension 4- 4-   Hip abduction 4- 4-   Hip adduction     Hip internal rotation     Hip external rotation  Knee flexion     Knee extension     Ankle dorsiflexion     Ankle plantarflexion 5 4 4   Ankle inversion   4  Ankle eversion   4   (Blank rows = not tested)   JOINT MOBILITY TESTING:  Not assessed  PALPATION:  Tender to right upper trap region                                                                                                                             TREATMENT [facility] Adult PT Treatment:                                                DATE: 05/15/2024 Slant board calf stretch 3 x 20 sec Standing heel raises 3 x 10 Longsitting ankle eversion with red 2 x 10 Seated figure-4 ankle inversion with red 2 x 10 Forward heel tap 2 x 10 SLS 3 x 30 sec Side clamshell with red 3 x 10 each LTR x 10 Bent over tricep extension with 2#x 20 each  PATIENT EDUCATION: Education details: HEP update  Person educated: Patient Education method: Explanation, Demonstration, Tactile cues, Verbal cues, and Handouts Education comprehension: verbalized understanding, returned demonstration, verbal cues required, tactile cues required, and needs further education  HOME EXERCISE PROGRAM: Access Code: [email or web address]   ASSESSMENT: CLINICAL IMPRESSION: Patient tolerated therapy well with no adverse effects. Therapy focused primarily on strengthening for the left ankle with good tolerance. She does report increased calf tightness on left with stretching and  weakness with her strengthening exercises. She also reports occasional instances of the left knee wanting to buckle primarily going down stairs which seems to be more related to ankle and hip weakness. Updated her HEP to progress ankle strength and balance. Patient would benefit from continued skilled PT to progress mobility and strength in order to reduce pain and maximize functional ability.   Eval: Patient is a 73 y.o. female who was seen today for physical therapy evaluation and treatment for right shoulder, lower back, left heel, and bilateral knee pain. Her primary complaint of pain today seemed to be her right heel from when she was walking on the treadmill earlier. She does exhibit some calf tightness and weakness. She also exhibit strength deficit of the right shoulder and hips that are likely contributing to her symptoms. Her back and right shoulder move well but she does report some tightness and tenderness of the right upper trap region.  OBJECTIVE IMPAIRMENTS: decreased activity tolerance, decreased ROM, decreased strength, impaired flexibility, postural dysfunction, and pain.   ACTIVITY LIMITATIONS: lifting, bending, sitting, squatting, reach over head, and locomotion level  PARTICIPATION LIMITATIONS: meal prep, cleaning, community activity, and yard work  PERSONAL FACTORS: Fitness, Past/current experiences, and Time since onset of injury/illness/exacerbation are also affecting patient's functional outcome.  GOALS: Goals reviewed with patient? Yes  SHORT TERM GOALS: Target date: 05/29/2024  Patient will be I with initial HEP in order to progress with therapy. Baseline: HEP provided at eval Goal status: INITIAL  2.  Patient will report pain level with activity </= 1/10 in order to reduce functional limitations Baseline: see pain score above Goal status: INITIAL  LONG TERM GOALS: Target date: 06/26/2024  Patient will be I with final HEP to maintain progress from  PT. Baseline: HEP provided at eval Goal status: INITIAL  2.  Patient will report PSFS >/= 9 in order to indicate improvement in their functional ability. Baseline: 8.2 Goal status: INITIAL  3.  Patient will demonstrate hip strength grossly >/= 4/5 MMT to improve activity tolerance and reduce pain Baseline: grossly 4-/5 MMT Goal status: INITIAL  4.  Patient will demonstrate right shoulder strength 5/5 MMT in order to improve activity tolerance with right shoulder Baseline: see limitations above Goal status: INITIAL   PLAN: PT FREQUENCY: 1x/week  PT DURATION: 8 weeks  PLANNED INTERVENTIONS: 97164- PT Re-evaluation, 97750- Physical Performance Testing, 97110-Therapeutic exercises, 97530- Therapeutic activity, 97112- Neuromuscular re-education, 97535- Self Care, 02859- Manual therapy, Patient/Family education, Balance training, Stair training, Taping, Joint mobilization, Joint manipulation, Spinal manipulation, Spinal mobilization, Cryotherapy, and Moist heat  PLAN FOR NEXT SESSION: Review HEP and progress PRN; manual for right upper trap region; strengthening for right shoulder, core, hips, and LE; postural control; dynamic balance   Elaine Daring, PT, DPT, LAT, ATC 05/15/24  3:42 PM Phone: [phone] Fax: [phone]

## 2024-05-15 NOTE — Patient Instructions (Signed)
 Access Code: [email or web address] URL: [email or web address] Date: 05/15/2024 Prepared by: Elaine Daring  Exercises - Marching Bridge  - 1 x daily - 2 sets - 5 reps - Clam with Resistance  - 1 x daily - 2 sets - 10 reps - Long Sitting Ankle Eversion with Resistance  - 1 x daily - 2 sets - 10 reps - Seated Figure 4 Ankle Inversion with Resistance  - 1 x daily - 2 sets - 10 reps - Heel Raises with Counter Support  - 1 x daily - 2 sets - 10 reps - Single Leg Stance  - 1 x daily - 2 reps - 30 seconds hold - Standing Row with Anchored Resistance  - 1 x daily - 2 sets - 10 reps - Standing Gastroc Stretch at Counter  - 1 x daily - 3 reps - 20 seconds hold

## 2024-05-22 ENCOUNTER — Ambulatory Visit (INDEPENDENT_AMBULATORY_CARE_PROVIDER_SITE_OTHER): Admitting: Physical Therapy

## 2024-05-22 ENCOUNTER — Other Ambulatory Visit: Payer: Self-pay

## 2024-05-22 ENCOUNTER — Encounter: Payer: Self-pay | Admitting: Physical Therapy

## 2024-05-22 DIAGNOSIS — M25561 Pain in right knee: Secondary | ICD-10-CM

## 2024-05-22 DIAGNOSIS — M6281 Muscle weakness (generalized): Secondary | ICD-10-CM | POA: Diagnosis not present

## 2024-05-22 DIAGNOSIS — M25562 Pain in left knee: Secondary | ICD-10-CM | POA: Diagnosis not present

## 2024-05-22 DIAGNOSIS — M79672 Pain in left foot: Secondary | ICD-10-CM | POA: Diagnosis not present

## 2024-05-22 DIAGNOSIS — M5459 Other low back pain: Secondary | ICD-10-CM | POA: Diagnosis not present

## 2024-05-22 DIAGNOSIS — M25511 Pain in right shoulder: Secondary | ICD-10-CM | POA: Diagnosis not present

## 2024-05-22 DIAGNOSIS — G8929 Other chronic pain: Secondary | ICD-10-CM

## 2024-05-22 NOTE — Therapy (Signed)
 OUTPATIENT PHYSICAL THERAPY TREATMENT   Patient Name: [name] MRN: [id number] DOB:08/17/1951, 73 y.o., female Today's Date: 05/22/2024   END OF SESSION:  PT End of Session - 05/22/24 1452     Visit Number 3    Number of Visits 9    Date for PT Re-Evaluation 06/26/24    Authorization Type MCR    Progress Note Due on Visit 10    PT Start Time 1431    PT Stop Time 1513    PT Time Calculation (min) 42 min    Activity Tolerance Patient tolerated treatment well    Behavior During Therapy WFL for tasks assessed/performed            Past Medical History:  Diagnosis Date   ALLERGIC RHINITIS    ANEMIA, PERNICIOUS    ANXIETY    BILIARY DYSKINESIA    CARPAL TUNNEL SYNDROME, LEFT    DYSLIPIDEMIA    Headache(784.0)    HYPERTENSION    Irritable bowel syndrome    Non-celiac gluten sensitivity    OSTEOPENIA    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HCC)    borderline   Past Surgical History:  Procedure Laterality Date   APPENDECTOMY     CARPAL TUNNEL RELEASE     CESAREAN SECTION  74 & 83   x's 2    CHOLECYSTECTOMY  2005   Dr. Gail (Gallbladder dyskinesia)   COLONOSCOPY     DILATION AND CURETTAGE OF UTERUS     LEFT HEART CATHETERIZATION WITH CORONARY ANGIOGRAM N/A 05/19/2012   Procedure: LEFT HEART CATHETERIZATION WITH CORONARY ANGIOGRAM;  Surgeon: Debby JONETTA Como, MD;  Location: [facility] CATH LAB;  Service: Cardiovascular;  Laterality: N/A;   ROTATOR CUFF REPAIR     TONSILLECTOMY     UPPER GASTROINTESTINAL ENDOSCOPY     Patient Active Problem List   Diagnosis Date Noted   Abdominal bloating 01/13/2024   Acute left-sided low back pain without sciatica 01/12/2024   Eczema 07/06/2023   Abnormal cervical Papanicolaou smear 07/06/2023   Neck pain on left side 05/21/2023   Acute pain of right shoulder 04/09/2023   Petechial rash 01/04/2023   Macular degeneration, early 07/10/2022   Palpitations 07/03/2022   Baker cyst, left 06/05/2022   Chronic low back pain 06/05/2022    Arthralgia 05/20/2022   Gait abnormality 05/14/2022   Thiamine  deficiency 05/14/2022   Reactive arthritis of right knee (HCC) 04/14/2022   Aortic atherosclerosis (HCC) 04/06/2022   Myalgia 04/06/2022   Arthralgia of both knees 04/06/2022   Salivary gland swelling 12/02/2020   Fall 02/13/2020   Left arm pain 02/13/2020   Hyperglycemia 06/07/2019   DDD (degenerative disc disease), cervical 02/02/2019   Dysuria 12/12/2018   Musculoskeletal neck pain 10/14/2018   Diverticulitis 08/11/2018   Shoulder pain, left 02/23/2017   B12 deficiency 08/07/2016   Recurrent UTI 11/08/2015   Non-celiac gluten sensitivity    Urinary frequency 01/10/2015   Anxiety state 01/17/2010   Allergic rhinitis 01/17/2010   Dyslipidemia 07/12/2009   Essential hypertension 07/12/2009   Osteoporosis 06/22/2008   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   ANEMIA, PERNICIOUS 10/14/2007   IRRITABLE BOWEL SYNDROME 10/14/2007    PCP: Geofm Glade JINNY, MD  REFERRING PROVIDER: Leonce Katz, DO  REFERRING DIAG: Chronic right shoulder pain; Subacromial bursitis of right shoulder joint; Chronic bilateral low back pain with left-sided sciatica  THERAPY DIAG:  Chronic right shoulder pain  Other low back pain  Pain in left foot  Chronic pain of both knees  Muscle  weakness (generalized)  Rationale for Evaluation and Treatment: Rehabilitation  ONSET DATE: Chronic   SUBJECTIVE SUBJECTIVE STATEMENT: Patient reports after she sits for a while and then goes to stand she gets pain in the left heel. She did walk on the treadmill last Tuesday for an hour at 2% incline and feels it was worse after that. She reports her right shoulder hasn't really bothered her recently, but she notes that he skin still feels odd.   Eval: Patient was seeing the doctor for her right shoulder and lower back, but is also reporting pain in her left heel today and her knees hurt. Regarding her right shoulder she was playing a  pool game and injured the right shoulder a few years ago. She did have a shot to sort of took care of the right shoulder but then she reached back in her car and felt a pop that was painful. She has also had two injections in her back which has helped but did not get rid of it completely.   Hand dominance: Right  PERTINENT HISTORY: See PMH above  PAIN:  Are you having pain? Yes:  NPRS scale: 0/10 Pain location: Right shoulder Pain description: Sharp Aggravating factors: Moving the shoulder in certain directions Relieving factors: Rest  NPRS scale: 2/10 Pain location: Lower back Pain description: Ache Aggravating factors: Sitting, squatting down or stooping Relieving factors: Rest  NPRS scale: 5/10 Pain location: Left heel Pain description: step on a stone, sore, tight Aggravating factors: Walking Relieving factors: Rest  PRECAUTIONS: None  PATIENT GOALS: Pain relief   OBJECTIVE:  Note: Objective measures were completed at Evaluation unless otherwise noted. PATIENT SURVEYS:  PSFS: 8.2 Getting up from sitting/ lower back aches: 9 Sometimes walking up stairs: 8 Opening jars: 8 Carrying laundry basket upstairs: 9 Working in garden: 7  POSTURE: Rounded shoulder posture  UPPER EXTREMITY ROM:   Active ROM Right eval Left eval  Shoulder flexion 160 160  Shoulder extension    Shoulder abduction 170 170  Shoulder adduction    Shoulder internal rotation T7 T7  Shoulder external rotation 65 / T2 70 / T2  Elbow flexion    Elbow extension    Wrist flexion    Wrist extension    Wrist ulnar deviation    Wrist radial deviation    Wrist pronation    Wrist supination    (Blank rows = not tested)  UPPER EXTREMITY MMT:  MMT Right eval Left eval  Shoulder flexion 4+ 5  Shoulder extension 5 5  Shoulder abduction 5 5  Shoulder adduction    Shoulder internal rotation 5 5  Shoulder external rotation 4+ 5  Middle trapezius    Lower trapezius    Elbow flexion 5 5   Elbow extension    Wrist flexion    Wrist extension    Wrist ulnar deviation    Wrist radial deviation    Wrist pronation    Wrist supination    Grip strength (lbs)    (Blank rows = not tested)  LUMBAR ROM:   Active  A/PROM  eval  Flexion WFL  Extension 75%  Right lateral flexion WFL  Left lateral flexion WFL  Right rotation WFL  Left rotation WFL   (Blank rows = not tested)  LOWER EXTREMITY MMT:    MMT Right eval Left eval Left 05/15/2024  Hip flexion     Hip extension 4- 4-   Hip abduction 4- 4-   Hip adduction  Hip internal rotation     Hip external rotation     Knee flexion     Knee extension     Ankle dorsiflexion     Ankle plantarflexion 5 4 4   Ankle inversion   4  Ankle eversion   4   (Blank rows = not tested)   JOINT MOBILITY TESTING:  Not assessed  PALPATION:  Tender to right upper trap region                                                                                                                             TREATMENT [facility] Adult PT Treatment:                                                DATE: 05/22/2024 STM for left calf and posterior tib region Slant board calf stretch 3 x 30 sec Standing heel raises 3 x 10  Discussed possible etiology of pain and alternate forms of exercise at the gym rather than walking on treadmill, the gradual progression of walking on treadmill  PATIENT EDUCATION: Education details: HEP update  Person educated: Patient Education method: Explanation, Demonstration, Tactile cues, Verbal cues, and Handouts Education comprehension: verbalized understanding, returned demonstration, verbal cues required, tactile cues required, and needs further education  HOME EXERCISE PROGRAM: Access Code: [email or web address]   ASSESSMENT: CLINICAL IMPRESSION: Patient tolerated therapy well with no adverse effects. She continues to report left heel pain with unclear etiology, possibly achilles tendinopathy vs posterior tib tendinopathy vs  plantar fasciitis. She does report tenderness along medial aspect of tibial region and also tension of the calf region with pulling/stretching and limited ankle dorsiflexion. She did report increased pain with her heel raises this visit and seems to have aggravated her heel more since her walking on the treadmill. Discussed using other forms of exercises for the next few days to de-load heel and then gradual progression back to walking. No changes made to her HEP this visit. Patient would benefit from continued skilled PT to progress mobility and strength in order to reduce pain and maximize functional ability.   Eval: Patient is a 73 y.o. female who was seen today for physical therapy evaluation and treatment for right shoulder, lower back, left heel, and bilateral knee pain. Her primary complaint of pain today seemed to be her right heel from when she was walking on the treadmill earlier. She does exhibit some calf tightness and weakness. She also exhibit strength deficit of the right shoulder and hips that are likely contributing to her symptoms. Her back and right shoulder move well but she does report some tightness and tenderness of the right upper trap region.  OBJECTIVE IMPAIRMENTS: decreased activity tolerance, decreased ROM, decreased strength, impaired flexibility, postural dysfunction, and pain.   ACTIVITY LIMITATIONS: lifting, bending, sitting, squatting, reach over head,  and locomotion level  PARTICIPATION LIMITATIONS: meal prep, cleaning, community activity, and yard work  PERSONAL FACTORS: Fitness, Past/current experiences, and Time since onset of injury/illness/exacerbation are also affecting patient's functional outcome.    GOALS: Goals reviewed with patient? Yes  SHORT TERM GOALS: Target date: 05/29/2024  Patient will be I with initial HEP in order to progress with therapy. Baseline: HEP provided at eval Goal status: INITIAL  2.  Patient will report pain level with activity  </= 1/10 in order to reduce functional limitations Baseline: see pain score above Goal status: INITIAL  LONG TERM GOALS: Target date: 06/26/2024  Patient will be I with final HEP to maintain progress from PT. Baseline: HEP provided at eval Goal status: INITIAL  2.  Patient will report PSFS >/= 9 in order to indicate improvement in their functional ability. Baseline: 8.2 Goal status: INITIAL  3.  Patient will demonstrate hip strength grossly >/= 4/5 MMT to improve activity tolerance and reduce pain Baseline: grossly 4-/5 MMT Goal status: INITIAL  4.  Patient will demonstrate right shoulder strength 5/5 MMT in order to improve activity tolerance with right shoulder Baseline: see limitations above Goal status: INITIAL   PLAN: PT FREQUENCY: 1x/week  PT DURATION: 8 weeks  PLANNED INTERVENTIONS: 97164- PT Re-evaluation, 97750- Physical Performance Testing, 97110-Therapeutic exercises, 97530- Therapeutic activity, 97112- Neuromuscular re-education, 97535- Self Care, [id number]- Manual therapy, Patient/Family education, Balance training, Stair training, Taping, Joint mobilization, Joint manipulation, Spinal manipulation, Spinal mobilization, Cryotherapy, and Moist heat  PLAN FOR NEXT SESSION: Review HEP and progress PRN; manual for right upper trap region; strengthening for right shoulder, core, hips, and LE; postural control; dynamic balance   Elaine Daring, PT, DPT, LAT, ATC 05/22/24  3:37 PM Phone: [phone] Fax: [phone]

## 2024-05-23 ENCOUNTER — Ambulatory Visit: Admitting: Internal Medicine

## 2024-05-23 ENCOUNTER — Ambulatory Visit: Admitting: Sports Medicine

## 2024-05-29 ENCOUNTER — Ambulatory Visit (INDEPENDENT_AMBULATORY_CARE_PROVIDER_SITE_OTHER): Admitting: Physical Therapy

## 2024-05-29 ENCOUNTER — Encounter: Payer: Self-pay | Admitting: Physical Therapy

## 2024-05-29 ENCOUNTER — Other Ambulatory Visit: Payer: Self-pay

## 2024-05-29 DIAGNOSIS — M79672 Pain in left foot: Secondary | ICD-10-CM | POA: Diagnosis not present

## 2024-05-29 DIAGNOSIS — G8929 Other chronic pain: Secondary | ICD-10-CM

## 2024-05-29 DIAGNOSIS — M5459 Other low back pain: Secondary | ICD-10-CM | POA: Diagnosis not present

## 2024-05-29 DIAGNOSIS — M25511 Pain in right shoulder: Secondary | ICD-10-CM

## 2024-05-29 NOTE — Therapy (Signed)
 OUTPATIENT PHYSICAL THERAPY TREATMENT   Patient Name: [name] MRN: [id number] DOB:Apr 08, 1951, 73 y.o., female Today's Date: 05/29/2024   END OF SESSION:  PT End of Session - 05/29/24 1440     Visit Number 4    Number of Visits 9    Date for Recertification  06/26/24    Authorization Type MCR    Progress Note Due on Visit 10    PT Start Time 1431    PT Stop Time 1515    PT Time Calculation (min) 44 min    Activity Tolerance Patient tolerated treatment well    Behavior During Therapy WFL for tasks assessed/performed             Past Medical History:  Diagnosis Date   ALLERGIC RHINITIS    ANEMIA, PERNICIOUS    ANXIETY    BILIARY DYSKINESIA    CARPAL TUNNEL SYNDROME, LEFT    DYSLIPIDEMIA    Headache(784.0)    HYPERTENSION    Irritable bowel syndrome    Non-celiac gluten sensitivity    OSTEOPENIA    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HCC)    borderline   Past Surgical History:  Procedure Laterality Date   APPENDECTOMY     CARPAL TUNNEL RELEASE     CESAREAN SECTION  74 & 83   x's 2    CHOLECYSTECTOMY  2005   Dr. Gail (Gallbladder dyskinesia)   COLONOSCOPY     DILATION AND CURETTAGE OF UTERUS     LEFT HEART CATHETERIZATION WITH CORONARY ANGIOGRAM N/A 05/19/2012   Procedure: LEFT HEART CATHETERIZATION WITH CORONARY ANGIOGRAM;  Surgeon: Debby JONETTA Como, MD;  Location: [facility] CATH LAB;  Service: Cardiovascular;  Laterality: N/A;   ROTATOR CUFF REPAIR     TONSILLECTOMY     UPPER GASTROINTESTINAL ENDOSCOPY     Patient Active Problem List   Diagnosis Date Noted   Abdominal bloating 01/13/2024   Acute left-sided low back pain without sciatica 01/12/2024   Eczema 07/06/2023   Abnormal cervical Papanicolaou smear 07/06/2023   Neck pain on left side 05/21/2023   Acute pain of right shoulder 04/09/2023   Petechial rash 01/04/2023   Macular degeneration, early 07/10/2022   Palpitations 07/03/2022   Baker cyst, left 06/05/2022   Chronic low back pain 06/05/2022    Arthralgia 05/20/2022   Gait abnormality 05/14/2022   Thiamine  deficiency 05/14/2022   Reactive arthritis of right knee (HCC) 04/14/2022   Aortic atherosclerosis 04/06/2022   Myalgia 04/06/2022   Arthralgia of both knees 04/06/2022   Salivary gland swelling 12/02/2020   Fall 02/13/2020   Left arm pain 02/13/2020   Hyperglycemia 06/07/2019   DDD (degenerative disc disease), cervical 02/02/2019   Dysuria 12/12/2018   Musculoskeletal neck pain 10/14/2018   Diverticulitis 08/11/2018   Shoulder pain, left 02/23/2017   B12 deficiency 08/07/2016   Recurrent UTI 11/08/2015   Non-celiac gluten sensitivity    Urinary frequency 01/10/2015   Anxiety state 01/17/2010   Allergic rhinitis 01/17/2010   Dyslipidemia 07/12/2009   Essential hypertension 07/12/2009   Osteoporosis 06/22/2008   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   ANEMIA, PERNICIOUS 10/14/2007   IRRITABLE BOWEL SYNDROME 10/14/2007    PCP: Geofm Glade JINNY, MD  REFERRING PROVIDER: Leonce Katz, DO  REFERRING DIAG: Chronic right shoulder pain; Subacromial bursitis of right shoulder joint; Chronic bilateral low back pain with left-sided sciatica  THERAPY DIAG:  Chronic right shoulder pain  Other low back pain  Pain in left foot  Rationale for Evaluation and Treatment: Rehabilitation  ONSET DATE: Chronic   SUBJECTIVE SUBJECTIVE STATEMENT: Patient reports the left heel is still pretty sore. She reports she was sore following last visit in the calf and foot.   Eval: Patient was seeing the doctor for her right shoulder and lower back, but is also reporting pain in her left heel today and her knees hurt. Regarding her right shoulder she was playing a pool game and injured the right shoulder a few years ago. She did have a shot to sort of took care of the right shoulder but then she reached back in her car and felt a pop that was painful. She has also had two injections in her back which has helped but did not  get rid of it completely.   Hand dominance: Right  PERTINENT HISTORY: See PMH above  PAIN:  Are you having pain? Yes:  NPRS scale: 0/10 Pain location: Right shoulder Pain description: Sharp Aggravating factors: Moving the shoulder in certain directions Relieving factors: Rest  NPRS scale: 2/10 Pain location: Lower back Pain description: Ache Aggravating factors: Sitting, squatting down or stooping Relieving factors: Rest  NPRS scale: 5/10 Pain location: Left heel Pain description: Sore, tight Aggravating factors: Walking Relieving factors: Rest  PRECAUTIONS: None  PATIENT GOALS: Pain relief   OBJECTIVE:  Note: Objective measures were completed at Evaluation unless otherwise noted. PATIENT SURVEYS:  PSFS: 8.2 Getting up from sitting/ lower back aches: 9 Sometimes walking up stairs: 8 Opening jars: 8 Carrying laundry basket upstairs: 9 Working in garden: 7  POSTURE: Rounded shoulder posture  UPPER EXTREMITY ROM:   Active ROM Right eval Left eval  Shoulder flexion 160 160  Shoulder extension    Shoulder abduction 170 170  Shoulder adduction    Shoulder internal rotation T7 T7  Shoulder external rotation 65 / T2 70 / T2  Elbow flexion    Elbow extension    Wrist flexion    Wrist extension    Wrist ulnar deviation    Wrist radial deviation    Wrist pronation    Wrist supination    (Blank rows = not tested)  UPPER EXTREMITY MMT:  MMT Right eval Left eval  Shoulder flexion 4+ 5  Shoulder extension 5 5  Shoulder abduction 5 5  Shoulder adduction    Shoulder internal rotation 5 5  Shoulder external rotation 4+ 5  Middle trapezius    Lower trapezius    Elbow flexion 5 5  Elbow extension    Wrist flexion    Wrist extension    Wrist ulnar deviation    Wrist radial deviation    Wrist pronation    Wrist supination    Grip strength (lbs)    (Blank rows = not tested)  LUMBAR ROM:   Active  A/PROM  eval  Flexion WFL  Extension 75%  Right  lateral flexion WFL  Left lateral flexion WFL  Right rotation [facility]  Left rotation WFL   (Blank rows = not tested)  LOWER EXTREMITY MMT:    MMT Right eval Left eval Left 05/15/2024 Left 05/29/2024  Hip flexion      Hip extension 4- 4-    Hip abduction 4- 4-    Hip adduction      Hip internal rotation      Hip external rotation      Knee flexion      Knee extension      Ankle dorsiflexion      Ankle plantarflexion 5 4 4 4   Ankle inversion  4 4  Ankle eversion   4    (Blank rows = not tested)   JOINT MOBILITY TESTING:  Not assessed  PALPATION:  Tender to right upper trap region                                                                                                                             TREATMENT [facility] Adult PT Treatment:                                                DATE: 05/29/2024 STM for left calf, posterior tib, and plantar fascia region Slant board calf stretch 3 x 30 sec Seated figure-4 ankle inversion with green 3 x 15 Standing heel raises with towel squeeze between heels 3 x 15 SLS 3 x 30 sec each  PATIENT EDUCATION: Education details: HEP update  Person educated: Patient Education method: Explanation, Demonstration, Tactile cues, Verbal cues, and Handouts Education comprehension: verbalized understanding, returned demonstration, verbal cues required, tactile cues required, and needs further education  HOME EXERCISE PROGRAM: Access Code: [email or web address]   ASSESSMENT: CLINICAL IMPRESSION: Patient tolerated therapy well with no adverse effects. She continues to report left heel pain that seems to be more medial and likely related to posterior tibialis symptoms so focused on stretching for ankle/calf flexibility and progressing ankle and posterior tib strength and control. She did report slight increase in left heel/ankle pain with exercises but this resolved with rest. Updated her HEP to focus more on posterior tib strengthening. Patient would benefit from  continued skilled PT to progress mobility and strength in order to reduce pain and maximize functional ability.   Eval: Patient is a 73 y.o. female who was seen today for physical therapy evaluation and treatment for right shoulder, lower back, left heel, and bilateral knee pain. Her primary complaint of pain today seemed to be her right heel from when she was walking on the treadmill earlier. She does exhibit some calf tightness and weakness. She also exhibit strength deficit of the right shoulder and hips that are likely contributing to her symptoms. Her back and right shoulder move well but she does report some tightness and tenderness of the right upper trap region.  OBJECTIVE IMPAIRMENTS: decreased activity tolerance, decreased ROM, decreased strength, impaired flexibility, postural dysfunction, and pain.   ACTIVITY LIMITATIONS: lifting, bending, sitting, squatting, reach over head, and locomotion level  PARTICIPATION LIMITATIONS: meal prep, cleaning, community activity, and yard work  PERSONAL FACTORS: Fitness, Past/current experiences, and Time since onset of injury/illness/exacerbation are also affecting patient's functional outcome.    GOALS: Goals reviewed with patient? Yes  SHORT TERM GOALS: Target date: 05/29/2024  Patient will be I with initial HEP in order to progress with therapy. Baseline: HEP provided at eval 05/29/2024: independent with initial HEP Goal status: MET  2.  Patient  will report pain level with activity </= 1/10 in order to reduce functional limitations Baseline: see pain score above 05/29/2024: continue to report left heel pain with activity Goal status: MET  LONG TERM GOALS: Target date: 06/26/2024  Patient will be I with final HEP to maintain progress from PT. Baseline: HEP provided at eval Goal status: INITIAL  2.  Patient will report PSFS >/= 9 in order to indicate improvement in their functional ability. Baseline: 8.2 Goal status: INITIAL  3.   Patient will demonstrate hip strength grossly >/= 4/5 MMT to improve activity tolerance and reduce pain Baseline: grossly 4-/5 MMT Goal status: INITIAL  4.  Patient will demonstrate right shoulder strength 5/5 MMT in order to improve activity tolerance with right shoulder Baseline: see limitations above Goal status: INITIAL   PLAN: PT FREQUENCY: 1x/week  PT DURATION: 8 weeks  PLANNED INTERVENTIONS: 97164- PT Re-evaluation, 97750- Physical Performance Testing, 97110-Therapeutic exercises, 97530- Therapeutic activity, 97112- Neuromuscular re-education, 97535- Self Care, 02859- Manual therapy, Patient/Family education, Balance training, Stair training, Taping, Joint mobilization, Joint manipulation, Spinal manipulation, Spinal mobilization, Cryotherapy, and Moist heat  PLAN FOR NEXT SESSION: Review HEP and progress PRN; manual for right upper trap region; strengthening for right shoulder, core, hips, and LE; postural control; dynamic balance   Elaine Daring, PT, DPT, LAT, ATC 05/29/24  3:46 PM Phone: [phone] Fax: [phone]

## 2024-05-31 ENCOUNTER — Other Ambulatory Visit: Payer: Self-pay | Admitting: Internal Medicine

## 2024-06-05 ENCOUNTER — Other Ambulatory Visit: Payer: Self-pay

## 2024-06-05 ENCOUNTER — Encounter: Payer: Self-pay | Admitting: Physical Therapy

## 2024-06-05 ENCOUNTER — Ambulatory Visit (INDEPENDENT_AMBULATORY_CARE_PROVIDER_SITE_OTHER): Admitting: Physical Therapy

## 2024-06-05 DIAGNOSIS — M25562 Pain in left knee: Secondary | ICD-10-CM

## 2024-06-05 DIAGNOSIS — G8929 Other chronic pain: Secondary | ICD-10-CM | POA: Diagnosis not present

## 2024-06-05 DIAGNOSIS — M79672 Pain in left foot: Secondary | ICD-10-CM | POA: Diagnosis not present

## 2024-06-05 DIAGNOSIS — M5459 Other low back pain: Secondary | ICD-10-CM | POA: Diagnosis not present

## 2024-06-05 DIAGNOSIS — M25561 Pain in right knee: Secondary | ICD-10-CM

## 2024-06-05 DIAGNOSIS — M6281 Muscle weakness (generalized): Secondary | ICD-10-CM | POA: Diagnosis not present

## 2024-06-05 DIAGNOSIS — M25511 Pain in right shoulder: Secondary | ICD-10-CM | POA: Diagnosis not present

## 2024-06-05 NOTE — Therapy (Signed)
 OUTPATIENT PHYSICAL THERAPY TREATMENT   Patient Name: [name] MRN: [id number] DOB:1951/07/26, 73 y.o., female Today's Date: 06/05/2024   END OF SESSION:  PT End of Session - 06/05/24 1420     Visit Number 5    Number of Visits 9    Date for Recertification  06/26/24    Authorization Type MCR    Progress Note Due on Visit 10    PT Start Time 1433    PT Stop Time 1512    PT Time Calculation (min) 39 min    Activity Tolerance Patient tolerated treatment well    Behavior During Therapy WFL for tasks assessed/performed              Past Medical History:  Diagnosis Date   ALLERGIC RHINITIS    ANEMIA, PERNICIOUS    ANXIETY    BILIARY DYSKINESIA    CARPAL TUNNEL SYNDROME, LEFT    DYSLIPIDEMIA    Headache(784.0)    HYPERTENSION    Irritable bowel syndrome    Non-celiac gluten sensitivity    OSTEOPENIA    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HCC)    borderline   Past Surgical History:  Procedure Laterality Date   APPENDECTOMY     CARPAL TUNNEL RELEASE     CESAREAN SECTION  74 & 83   x's 2    CHOLECYSTECTOMY  2005   Dr. Gail (Gallbladder dyskinesia)   COLONOSCOPY     DILATION AND CURETTAGE OF UTERUS     LEFT HEART CATHETERIZATION WITH CORONARY ANGIOGRAM N/A 05/19/2012   Procedure: LEFT HEART CATHETERIZATION WITH CORONARY ANGIOGRAM;  Surgeon: Debby JONETTA Como, MD;  Location: [facility] CATH LAB;  Service: Cardiovascular;  Laterality: N/A;   ROTATOR CUFF REPAIR     TONSILLECTOMY     UPPER GASTROINTESTINAL ENDOSCOPY     Patient Active Problem List   Diagnosis Date Noted   Abdominal bloating 01/13/2024   Acute left-sided low back pain without sciatica 01/12/2024   Eczema 07/06/2023   Abnormal cervical Papanicolaou smear 07/06/2023   Neck pain on left side 05/21/2023   Acute pain of right shoulder 04/09/2023   Petechial rash 01/04/2023   Macular degeneration, early 07/10/2022   Palpitations 07/03/2022   Baker cyst, left 06/05/2022   Chronic low back pain  06/05/2022   Arthralgia 05/20/2022   Gait abnormality 05/14/2022   Thiamine  deficiency 05/14/2022   Reactive arthritis of right knee (HCC) 04/14/2022   Aortic atherosclerosis 04/06/2022   Myalgia 04/06/2022   Arthralgia of both knees 04/06/2022   Salivary gland swelling 12/02/2020   Fall 02/13/2020   Left arm pain 02/13/2020   Hyperglycemia 06/07/2019   DDD (degenerative disc disease), cervical 02/02/2019   Dysuria 12/12/2018   Musculoskeletal neck pain 10/14/2018   Diverticulitis 08/11/2018   Shoulder pain, left 02/23/2017   B12 deficiency 08/07/2016   Recurrent UTI 11/08/2015   Non-celiac gluten sensitivity    Urinary frequency 01/10/2015   Anxiety state 01/17/2010   Allergic rhinitis 01/17/2010   Dyslipidemia 07/12/2009   Essential hypertension 07/12/2009   Osteoporosis 06/22/2008   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   ANEMIA, PERNICIOUS 10/14/2007   IRRITABLE BOWEL SYNDROME 10/14/2007    PCP: Geofm Glade JINNY, MD  REFERRING PROVIDER: Leonce Katz, DO  REFERRING DIAG: Chronic right shoulder pain; Subacromial bursitis of right shoulder joint; Chronic bilateral low back pain with left-sided sciatica  THERAPY DIAG:  Chronic right shoulder pain  Other low back pain  Pain in left foot  Chronic pain of both knees  Muscle weakness (generalized)  Rationale for Evaluation and Treatment: Rehabilitation  ONSET DATE: Chronic   SUBJECTIVE SUBJECTIVE STATEMENT: Patient reports the left heel is a little better. She walked on the treadmill for about 15 minutes the other day. She has still been doing the exercise classes. She states some achiness in the left calf and up the back of her thigh.   Eval: Patient was seeing the doctor for her right shoulder and lower back, but is also reporting pain in her left heel today and her knees hurt. Regarding her right shoulder she was playing a pool game and injured the right shoulder a few years ago. She did have a  shot to sort of took care of the right shoulder but then she reached back in her car and felt a pop that was painful. She has also had two injections in her back which has helped but did not get rid of it completely.   Hand dominance: Right  PERTINENT HISTORY: See PMH above  PAIN:  Are you having pain? Yes:  NPRS scale: 0/10 Pain location: Right shoulder Pain description: Sharp Aggravating factors: Moving the shoulder in certain directions Relieving factors: Rest  NPRS scale: 0/10 Pain location: Lower back Pain description: Ache Aggravating factors: Sitting, squatting down or stooping Relieving factors: Rest  NPRS scale: 0/10 at rest, 2-3/10 with walking Pain location: Left heel Pain description: Sore, tight Aggravating factors: Walking Relieving factors: Rest  PRECAUTIONS: None  PATIENT GOALS: Pain relief   OBJECTIVE:  Note: Objective measures were completed at Evaluation unless otherwise noted. PATIENT SURVEYS:  PSFS: 8.2 Getting up from sitting/ lower back aches: 9 Sometimes walking up stairs: 8 Opening jars: 8 Carrying laundry basket upstairs: 9 Working in garden: 7  POSTURE: Rounded shoulder posture  UPPER EXTREMITY ROM:   Active ROM Right eval Left eval  Shoulder flexion 160 160  Shoulder extension    Shoulder abduction 170 170  Shoulder adduction    Shoulder internal rotation T7 T7  Shoulder external rotation 65 / T2 70 / T2  Elbow flexion    Elbow extension    Wrist flexion    Wrist extension    Wrist ulnar deviation    Wrist radial deviation    Wrist pronation    Wrist supination    (Blank rows = not tested)  UPPER EXTREMITY MMT:  MMT Right eval Left eval  Shoulder flexion 4+ 5  Shoulder extension 5 5  Shoulder abduction 5 5  Shoulder adduction    Shoulder internal rotation 5 5  Shoulder external rotation 4+ 5  Middle trapezius    Lower trapezius    Elbow flexion 5 5  Elbow extension    Wrist flexion    Wrist extension     Wrist ulnar deviation    Wrist radial deviation    Wrist pronation    Wrist supination    Grip strength (lbs)    (Blank rows = not tested)  LUMBAR ROM:   Active  A/PROM  eval  Flexion WFL  Extension 75%  Right lateral flexion WFL  Left lateral flexion WFL  Right rotation [facility]  Left rotation WFL   (Blank rows = not tested)  LOWER EXTREMITY MMT:    MMT Right eval Left eval Left 05/15/2024 Left 05/29/2024  Hip flexion      Hip extension 4- 4-    Hip abduction 4- 4-    Hip adduction      Hip internal rotation  Hip external rotation      Knee flexion      Knee extension      Ankle dorsiflexion      Ankle plantarflexion 5 4 4 4   Ankle inversion   4 4  Ankle eversion   4    (Blank rows = not tested)   JOINT MOBILITY TESTING:  Not assessed  PALPATION:  Tender to right upper trap region                                                                                                                             TREATMENT [facility] Adult PT Treatment:                                                DATE: 06/05/2024 Supine sciatic nerve mobilization with ankle DF/PF 2 x 10 each STM for left calf, posterior tib, and peroneals SLS on Airex 3 x 30 sec each Slant board calf stretch 3 x 30 sec Lateral band walk with red at ankles 2 x 20 down/back Standing heel raises with towel squeeze between heels 2 x 20  PATIENT EDUCATION: Education details: HEP update  Person educated: Patient Education method: Explanation, Demonstration, Tactile cues, Verbal cues, and Handouts Education comprehension: verbalized understanding, returned demonstration, verbal cues required, tactile cues required, and needs further education  HOME EXERCISE PROGRAM: Access Code: [email or web address]   ASSESSMENT: CLINICAL IMPRESSION: Patient tolerated therapy well with no adverse effects. Therapy focused on incorporating sciatic nerve mobilizations and progressing her ankle and hip strength and control with good  tolerance. She reports gradual improvement in her symptoms at this point and was able to progress her banded strengthening and increased repetitions with standing exercises with good tolerance. Updated HEP to progress her hip strengthening and control with good tolerance. Patient would benefit from continued skilled PT to progress mobility and strength in order to reduce pain and maximize functional ability.   Eval: Patient is a 73 y.o. female who was seen today for physical therapy evaluation and treatment for right shoulder, lower back, left heel, and bilateral knee pain. Her primary complaint of pain today seemed to be her right heel from when she was walking on the treadmill earlier. She does exhibit some calf tightness and weakness. She also exhibit strength deficit of the right shoulder and hips that are likely contributing to her symptoms. Her back and right shoulder move well but she does report some tightness and tenderness of the right upper trap region.  OBJECTIVE IMPAIRMENTS: decreased activity tolerance, decreased ROM, decreased strength, impaired flexibility, postural dysfunction, and pain.   ACTIVITY LIMITATIONS: lifting, bending, sitting, squatting, reach over head, and locomotion level  PARTICIPATION LIMITATIONS: meal prep, cleaning, community activity, and yard work  PERSONAL FACTORS: Fitness, Past/current experiences, and Time since onset of injury/illness/exacerbation are also affecting patient's  functional outcome.    GOALS: Goals reviewed with patient? Yes  SHORT TERM GOALS: Target date: 05/29/2024  Patient will be I with initial HEP in order to progress with therapy. Baseline: HEP provided at eval 05/29/2024: independent with initial HEP Goal status: MET  2.  Patient will report pain level with activity </= 1/10 in order to reduce functional limitations Baseline: see pain score above 05/29/2024: continue to report left heel pain with activity Goal status: MET  LONG  TERM GOALS: Target date: 06/26/2024  Patient will be I with final HEP to maintain progress from PT. Baseline: HEP provided at eval Goal status: INITIAL  2.  Patient will report PSFS >/= 9 in order to indicate improvement in their functional ability. Baseline: 8.2 Goal status: INITIAL  3.  Patient will demonstrate hip strength grossly >/= 4/5 MMT to improve activity tolerance and reduce pain Baseline: grossly 4-/5 MMT Goal status: INITIAL  4.  Patient will demonstrate right shoulder strength 5/5 MMT in order to improve activity tolerance with right shoulder Baseline: see limitations above Goal status: INITIAL   PLAN: PT FREQUENCY: 1x/week  PT DURATION: 8 weeks  PLANNED INTERVENTIONS: 97164- PT Re-evaluation, 97750- Physical Performance Testing, 97110-Therapeutic exercises, 97530- Therapeutic activity, 97112- Neuromuscular re-education, 97535- Self Care, [id number]- Manual therapy, Patient/Family education, Balance training, Stair training, Taping, Joint mobilization, Joint manipulation, Spinal manipulation, Spinal mobilization, Cryotherapy, and Moist heat  PLAN FOR NEXT SESSION: Review HEP and progress PRN; manual for right upper trap region; strengthening for right shoulder, core, hips, and LE; postural control; dynamic balance   Elaine Daring, PT, DPT, LAT, ATC 06/05/24  3:23 PM Phone: [phone] Fax: [phone]

## 2024-06-14 ENCOUNTER — Other Ambulatory Visit: Payer: Self-pay | Admitting: Internal Medicine

## 2024-06-19 ENCOUNTER — Other Ambulatory Visit: Payer: Self-pay

## 2024-06-19 ENCOUNTER — Ambulatory Visit (INDEPENDENT_AMBULATORY_CARE_PROVIDER_SITE_OTHER): Admitting: Physical Therapy

## 2024-06-19 ENCOUNTER — Encounter: Payer: Self-pay | Admitting: Physical Therapy

## 2024-06-19 DIAGNOSIS — M25511 Pain in right shoulder: Secondary | ICD-10-CM

## 2024-06-19 DIAGNOSIS — M6281 Muscle weakness (generalized): Secondary | ICD-10-CM

## 2024-06-19 DIAGNOSIS — G8929 Other chronic pain: Secondary | ICD-10-CM

## 2024-06-19 DIAGNOSIS — M79672 Pain in left foot: Secondary | ICD-10-CM | POA: Diagnosis not present

## 2024-06-19 DIAGNOSIS — M25562 Pain in left knee: Secondary | ICD-10-CM | POA: Diagnosis not present

## 2024-06-19 DIAGNOSIS — M5459 Other low back pain: Secondary | ICD-10-CM | POA: Diagnosis not present

## 2024-06-19 DIAGNOSIS — M25561 Pain in right knee: Secondary | ICD-10-CM

## 2024-06-19 NOTE — Therapy (Signed)
 OUTPATIENT PHYSICAL THERAPY TREATMENT   Patient Name: LAYSA KIMMEY MRN: [id number] DOB:04-24-1951, 73 y.o., female Today's Date: 06/19/2024   END OF SESSION:  PT End of Session - 06/19/24 1515     Visit Number 6    Number of Visits 9    Date for Recertification  06/26/24    Authorization Type MCR    Progress Note Due on Visit 10    PT Start Time 1515    PT Stop Time 1555    PT Time Calculation (min) 40 min    Activity Tolerance Patient tolerated treatment well    Behavior During Therapy WFL for tasks assessed/performed               Past Medical History:  Diagnosis Date   ALLERGIC RHINITIS    ANEMIA, PERNICIOUS    ANXIETY    BILIARY DYSKINESIA    CARPAL TUNNEL SYNDROME, LEFT    DYSLIPIDEMIA    Headache(784.0)    HYPERTENSION    Irritable bowel syndrome    Non-celiac gluten sensitivity    OSTEOPENIA    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HCC)    borderline   Past Surgical History:  Procedure Laterality Date   APPENDECTOMY     CARPAL TUNNEL RELEASE     CESAREAN SECTION  74 & 83   x's 2    CHOLECYSTECTOMY  2005   Dr. Gail (Gallbladder dyskinesia)   COLONOSCOPY     DILATION AND CURETTAGE OF UTERUS     LEFT HEART CATHETERIZATION WITH CORONARY ANGIOGRAM N/A 05/19/2012   Procedure: LEFT HEART CATHETERIZATION WITH CORONARY ANGIOGRAM;  Surgeon: Debby JONETTA Como, MD;  Location: [facility] CATH LAB;  Service: Cardiovascular;  Laterality: N/A;   ROTATOR CUFF REPAIR     TONSILLECTOMY     UPPER GASTROINTESTINAL ENDOSCOPY     Patient Active Problem List   Diagnosis Date Noted   Abdominal bloating 01/13/2024   Acute left-sided low back pain without sciatica 01/12/2024   Eczema 07/06/2023   Abnormal cervical Papanicolaou smear 07/06/2023   Neck pain on left side 05/21/2023   Acute pain of right shoulder 04/09/2023   Petechial rash 01/04/2023   Macular degeneration, early 07/10/2022   Palpitations 07/03/2022   Baker cyst, left 06/05/2022   Chronic low back pain  06/05/2022   Arthralgia 05/20/2022   Gait abnormality 05/14/2022   Thiamine  deficiency 05/14/2022   Reactive arthritis of right knee (HCC) 04/14/2022   Aortic atherosclerosis 04/06/2022   Myalgia 04/06/2022   Arthralgia of both knees 04/06/2022   Salivary gland swelling 12/02/2020   Fall 02/13/2020   Left arm pain 02/13/2020   Hyperglycemia 06/07/2019   DDD (degenerative disc disease), cervical 02/02/2019   Dysuria 12/12/2018   Musculoskeletal neck pain 10/14/2018   Diverticulitis 08/11/2018   Shoulder pain, left 02/23/2017   B12 deficiency 08/07/2016   Recurrent UTI 11/08/2015   Non-celiac gluten sensitivity    Urinary frequency 01/10/2015   Anxiety state 01/17/2010   Allergic rhinitis 01/17/2010   Dyslipidemia 07/12/2009   Essential hypertension 07/12/2009   Osteoporosis 06/22/2008   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   ANEMIA, PERNICIOUS 10/14/2007   IRRITABLE BOWEL SYNDROME 10/14/2007    PCP: Geofm Glade JINNY, MD  REFERRING PROVIDER: Leonce Katz, DO  REFERRING DIAG: Chronic right shoulder pain; Subacromial bursitis of right shoulder joint; Chronic bilateral low back pain with left-sided sciatica  THERAPY DIAG:  Chronic right shoulder pain  Other low back pain  Pain in left foot  Chronic pain of both knees  Muscle weakness (generalized)  Rationale for Evaluation and Treatment: Rehabilitation  ONSET DATE: Chronic   SUBJECTIVE SUBJECTIVE STATEMENT: Patient reports she thinks she is better. She states when she walks she can still feel her left heel and coming up the leg a bit. Her right shoulder does get sore with yard work.   Eval: Patient was seeing the doctor for her right shoulder and lower back, but is also reporting pain in her left heel today and her knees hurt. Regarding her right shoulder she was playing a pool game and injured the right shoulder a few years ago. She did have a shot to sort of took care of the right shoulder but then  she reached back in her car and felt a pop that was painful. She has also had two injections in her back which has helped but did not get rid of it completely.   Hand dominance: Right  PERTINENT HISTORY: See PMH above  PAIN:  Are you having pain? Yes:  NPRS scale: 0/10 Pain location: Right shoulder Pain description: Sharp Aggravating factors: Moving the shoulder in certain directions Relieving factors: Rest  NPRS scale: 0/10 Pain location: Lower back Pain description: Ache Aggravating factors: Sitting, squatting down or stooping Relieving factors: Rest  NPRS scale: 0/10 at rest, 2-3/10 with walking Pain location: Left heel Pain description: Sore, tight Aggravating factors: Walking Relieving factors: Rest  PRECAUTIONS: None  PATIENT GOALS: Pain relief   OBJECTIVE:  Note: Objective measures were completed at Evaluation unless otherwise noted. PATIENT SURVEYS:  PSFS: 8.2 Getting up from sitting/ lower back aches: 9 Sometimes walking up stairs: 8 Opening jars: 8 Carrying laundry basket upstairs: 9 Working in garden: 7  POSTURE: Rounded shoulder posture  UPPER EXTREMITY ROM:   Active ROM Right eval Left eval  Shoulder flexion 160 160  Shoulder extension    Shoulder abduction 170 170  Shoulder adduction    Shoulder internal rotation T7 T7  Shoulder external rotation 65 / T2 70 / T2  Elbow flexion    Elbow extension    Wrist flexion    Wrist extension    Wrist ulnar deviation    Wrist radial deviation    Wrist pronation    Wrist supination    (Blank rows = not tested)  UPPER EXTREMITY MMT:  MMT Right eval Left eval  Shoulder flexion 4+ 5  Shoulder extension 5 5  Shoulder abduction 5 5  Shoulder adduction    Shoulder internal rotation 5 5  Shoulder external rotation 4+ 5  Middle trapezius    Lower trapezius    Elbow flexion 5 5  Elbow extension    Wrist flexion    Wrist extension    Wrist ulnar deviation    Wrist radial deviation    Wrist  pronation    Wrist supination    Grip strength (lbs)    (Blank rows = not tested)  LUMBAR ROM:   Active  A/PROM  eval  Flexion WFL  Extension 75%  Right lateral flexion WFL  Left lateral flexion WFL  Right rotation [facility]  Left rotation WFL   (Blank rows = not tested)  LOWER EXTREMITY MMT:    MMT Right eval Left eval Left 05/15/2024 Left 05/29/2024  Hip flexion      Hip extension 4- 4-    Hip abduction 4- 4-    Hip adduction      Hip internal rotation      Hip external rotation      Knee  flexion      Knee extension      Ankle dorsiflexion      Ankle plantarflexion 5 4 4 4   Ankle inversion   4 4  Ankle eversion   4    (Blank rows = not tested)   JOINT MOBILITY TESTING:  Not assessed  PALPATION:  Tender to right upper trap region                                                                                                                             TREATMENT [facility] Adult PT Treatment:                                                DATE: 06/19/2024 Slant board calf stretch 3 x 30 sec Supine sciatic nerve mobilization with ankle DF/PF 2 x 10 each Heel raises on edge of step with ball squeeze between heels 3 x 10 SL heel raises with opposite foot on 8 box 3 x 10 each Seated heel raise with 15# 3 x 10 each SLS on Airex 3 x 30 sec each  PATIENT EDUCATION: Education details: HEP  Person educated: Patient Education method: Programmer, multimedia, Demonstration, Tactile cues, Verbal cues, and Handouts Education comprehension: verbalized understanding, returned demonstration, verbal cues required, tactile cues required, and needs further education  HOME EXERCISE PROGRAM: Access Code: [email or web address]   ASSESSMENT: CLINICAL IMPRESSION: Patient tolerated therapy well with no adverse effects. Therapy focused primarily on progressing ankle strengthening with good tolerance. Her pain remains around the left medial ankle/foot region and seems most consistent with posterior tib related pain.  She did not report any increased pain with exercises today but does report feeling of fullness. She did have more difficulty with SLS on the left this visit. No changes made to her HEP. Patient would benefit from continued skilled PT to progress mobility and strength in order to reduce pain and maximize functional ability.   Eval: Patient is a 73 y.o. female who was seen today for physical therapy evaluation and treatment for right shoulder, lower back, left heel, and bilateral knee pain. Her primary complaint of pain today seemed to be her right heel from when she was walking on the treadmill earlier. She does exhibit some calf tightness and weakness. She also exhibit strength deficit of the right shoulder and hips that are likely contributing to her symptoms. Her back and right shoulder move well but she does report some tightness and tenderness of the right upper trap region.  OBJECTIVE IMPAIRMENTS: decreased activity tolerance, decreased ROM, decreased strength, impaired flexibility, postural dysfunction, and pain.   ACTIVITY LIMITATIONS: lifting, bending, sitting, squatting, reach over head, and locomotion level  PARTICIPATION LIMITATIONS: meal prep, cleaning, community activity, and yard work  PERSONAL FACTORS: Fitness, Past/current experiences, and Time since onset of injury/illness/exacerbation are also affecting patient's  functional outcome.    GOALS: Goals reviewed with patient? Yes  SHORT TERM GOALS: Target date: 05/29/2024  Patient will be I with initial HEP in order to progress with therapy. Baseline: HEP provided at eval 05/29/2024: independent with initial HEP Goal status: MET  2.  Patient will report pain level with activity </= 1/10 in order to reduce functional limitations Baseline: see pain score above 05/29/2024: continue to report left heel pain with activity Goal status: MET  LONG TERM GOALS: Target date: 06/26/2024  Patient will be I with final HEP to maintain  progress from PT. Baseline: HEP provided at eval Goal status: INITIAL  2.  Patient will report PSFS >/= 9 in order to indicate improvement in their functional ability. Baseline: 8.2 Goal status: INITIAL  3.  Patient will demonstrate hip strength grossly >/= 4/5 MMT to improve activity tolerance and reduce pain Baseline: grossly 4-/5 MMT Goal status: INITIAL  4.  Patient will demonstrate right shoulder strength 5/5 MMT in order to improve activity tolerance with right shoulder Baseline: see limitations above Goal status: INITIAL   PLAN: PT FREQUENCY: 1x/week  PT DURATION: 8 weeks  PLANNED INTERVENTIONS: 97164- PT Re-evaluation, 97750- Physical Performance Testing, 97110-Therapeutic exercises, 97530- Therapeutic activity, 97112- Neuromuscular re-education, 97535- Self Care, [id number]- Manual therapy, Patient/Family education, Balance training, Stair training, Taping, Joint mobilization, Joint manipulation, Spinal manipulation, Spinal mobilization, Cryotherapy, and Moist heat  PLAN FOR NEXT SESSION: Review HEP and progress PRN; manual for right upper trap region; strengthening for right shoulder, core, hips, and LE; postural control; dynamic balance   Elaine Daring, PT, DPT, LAT, ATC 06/19/24  4:04 PM Phone: [phone] Fax: [phone]

## 2024-06-22 NOTE — Progress Notes (Signed)
 [name] D.[facility] [address] [place] [address] Phone: [phone]   Assessment and Plan:     1. Chronic right shoulder pain (Primary) 2. Subacromial bursitis of right shoulder joint -Chronic with exacerbation, subsequent visit - Overall significant improvement after subacromial CSI on 04/04/2024, HEP, physical therapy, intermittent NSAID use.  Most consistent with rotator cuff tendinopathy versus biceps tendinopathy that is improving - Use NSAID such as ibuprofen  200-600 mg daily as needed for breakthrough pain.  Recommend limiting chronic NSAIDs to 1-2 doses per week to prevent long-term side effects. Use Tylenol  500 to 1000 mg tablets 2-3 times a day as needed for day-to-day pain relief.    -Continue physical therapy and HEP  3. Chronic bilateral low back pain with left-sided sciatica 4. Degeneration of intervertebral disc of lumbar region with discogenic back pain and lower extremity pain -Chronic, stable, subsequent visit - Overall low back pain is well-controlled, the patient will have intermittent flares of pain.  Most consistent with flare of lumbar degenerative changes that are well-controlled with conservative therapy - Continue HEP and physical therapy - Use NSAID such as ibuprofen  200-600 mg daily as needed for breakthrough pain.  Recommend limiting chronic NSAIDs to 1-2 doses per week to prevent long-term side effects. Use Tylenol  500 to 1000 mg tablets 2-3 times a day as needed for day-to-day pain relief.       Pertinent previous records reviewed include none   Follow Up: As needed   Subjective:   I, [name], am serving as a Neurosurgeon for Doctor Morene Mace  Chief Complaint: Recurrent right shoulder pain   HPI:    01/26/24 Patient is a 73 year old female with back pain. Patient states last month she was moving a pot and flared her low back. Pain is radiating to the left side. Prednisone  didn't help much  .  Saw PCP 01/13/2024 Approximately three weeks ago she was doing yard work and lifted something heavy and has had lower back pain since then.  Initially pain was across the lower back and now it is concentrated in the left lower back.  She has been taking Tylenol  and ibuprofen .  They have helped, but her pain is still there.   No radiating pain.     She has difficulty lifting her left leg because it increases the pain.  As so it hurts more to sit - she feels it through to her abdomen.  She had a sharp pain across her lower abdomen for a little while.    02/16/2024 Patient states the nerve type pain has gotten a little better, but she has been a ton of tylenol  and ibuprofen . States she stopped taking it yesterday and she started feeling the pain in her back and leg and she has aching pain in her heel   03/08/2024 Patient states no more sharp pain but still achy. States her legs feel weird. 50% relief    04/04/2024 Patient states she is doing good, feeling better. R shoulder pain is back , feels like something is broken. Left heel is in pain   04/25/24 Patient states that she feels better than she was feeling. Friday afternoon after the injection she heard and felt the shoulder pop when she was reaching behind her. The next day she was reaching down the garbage disposal than she felt a muscle cramp. Had a bruise on the right arm in the bicep area. It is pretty much healed now.  06/26/2024 Patient states she is feeling better. No perfect , but going in the right direction.    Relevant Historical Information: Hypertension, osteoporosis    Additional pertinent review of systems negative.   Current Outpatient Medications:    ALPRAZolam  (XANAX ) 0.25 MG tablet, TAKE 1 TABLET(0.25 MG) BY MOUTH TWICE DAILY AS NEEDED FOR ANXIETY, Disp: 30 tablet, Rfl: 0   amLODipine  (NORVASC ) 10 MG tablet, TAKE 1 TABLET(10 MG) BY MOUTH DAILY, Disp: 90 tablet, Rfl: 2   CALCIUM  CITRATE PO, Take by mouth., Disp: , Rfl:     cetirizine (ZYRTEC) 10 MG tablet, Take 10 mg by mouth daily., Disp: , Rfl:    Cholecalciferol  (VITAMIN D -3 PO), Take by mouth., Disp: , Rfl:    hydrochlorothiazide  (HYDRODIURIL ) 12.5 MG tablet, TAKE 1 TABLET(12.5 MG) BY MOUTH DAILY, Disp: 90 tablet, Rfl: 3   pravastatin  (PRAVACHOL ) 40 MG tablet, TAKE 1 TABLET(40 MG) BY MOUTH DAILY, Disp: 90 tablet, Rfl: 2   Objective:     Vitals:   06/26/24 1249  BP: 118/72  Pulse: 84  SpO2: 98%  Weight: 117 lb (53.1 kg)  Height: 4' 10 (1.473 m)      Body mass index is 24.45 kg/m.    Physical Exam:    Gen: Appears well, nad, nontoxic and pleasant Neuro:sensation intact, strength is 5/5 with df/pf/inv/ev, muscle tone wnl Skin: no suspicious lesion or defmority Psych: A&O, appropriate mood and affect  Right shoulder:  No deformity, swelling or muscle wasting No scapular winging FF 180, abd 180, int 0, ext 90 NTTP over the [place], clavicle, ac, coracoid, biceps groove, humerus, deltoid, trapezius, cervical spine Negative Spurling's test bilat FROM of neck    Electronically signed by:  Odis Mace D[facility] 1:02 PM 06/26/24

## 2024-06-26 ENCOUNTER — Ambulatory Visit (INDEPENDENT_AMBULATORY_CARE_PROVIDER_SITE_OTHER): Admitting: Sports Medicine

## 2024-06-26 VITALS — BP 118/72 | HR 84 | Ht <= 58 in | Wt 117.0 lb

## 2024-06-26 DIAGNOSIS — G8929 Other chronic pain: Secondary | ICD-10-CM | POA: Diagnosis not present

## 2024-06-26 DIAGNOSIS — M7551 Bursitis of right shoulder: Secondary | ICD-10-CM

## 2024-06-26 DIAGNOSIS — M51362 Other intervertebral disc degeneration, lumbar region with discogenic back pain and lower extremity pain: Secondary | ICD-10-CM | POA: Diagnosis not present

## 2024-06-26 DIAGNOSIS — M25511 Pain in right shoulder: Secondary | ICD-10-CM

## 2024-06-26 NOTE — Patient Instructions (Signed)
-   Use NSAID daily as needed for breakthrough pain.  Recommend limiting chronic NSAIDs to 1-2 doses per week to prevent long-term side effects. Use Tylenol  500 to 1000 mg tablets 2-3 times a day as needed for day-to-day pain relief.    Continue and complete Physical Therapy   Continue HEP   As needed follow up

## 2024-07-03 ENCOUNTER — Other Ambulatory Visit: Payer: Self-pay

## 2024-07-03 ENCOUNTER — Ambulatory Visit: Admitting: Physical Therapy

## 2024-07-03 ENCOUNTER — Encounter: Payer: Self-pay | Admitting: Physical Therapy

## 2024-07-03 DIAGNOSIS — M79672 Pain in left foot: Secondary | ICD-10-CM

## 2024-07-03 DIAGNOSIS — M6281 Muscle weakness (generalized): Secondary | ICD-10-CM | POA: Diagnosis not present

## 2024-07-03 DIAGNOSIS — M25511 Pain in right shoulder: Secondary | ICD-10-CM

## 2024-07-03 DIAGNOSIS — M5459 Other low back pain: Secondary | ICD-10-CM | POA: Diagnosis not present

## 2024-07-03 DIAGNOSIS — M25561 Pain in right knee: Secondary | ICD-10-CM | POA: Diagnosis not present

## 2024-07-03 DIAGNOSIS — G8929 Other chronic pain: Secondary | ICD-10-CM | POA: Diagnosis not present

## 2024-07-03 DIAGNOSIS — M25562 Pain in left knee: Secondary | ICD-10-CM | POA: Diagnosis not present

## 2024-07-03 NOTE — Patient Instructions (Signed)
 Access Code: [email or web address] URL: [email or web address] Date: 07/03/2024 Prepared by: Elaine Daring  Exercises - Supine Sciatic Nerve Glide  - 1 x daily - 2 sets - 10 reps - Supine Piriformis Stretch with Foot on Ground  - 1 x daily - 3 reps - 20 seconds hold - Bridge with Resistance  - 3 x weekly - 2 sets - 10 reps - Clam with Resistance  - 3 x weekly - 2 sets - 10 reps - Long Sitting Ankle Eversion with Resistance  - 3 x weekly - 2 sets - 10 reps - Seated Figure 4 Ankle Inversion with Resistance  - 3 x weekly - 2 sets - 10 reps - Heel Raises with Counter Support  - 3 x weekly - 2 sets - 10 reps - Single Leg Stance  - 1 x daily - 2 reps - 30 seconds hold - Side Stepping with Resistance at Thighs  - 3 x weekly - 2 sets - 20 reps - Standing Gastroc Stretch at Counter  - 1 x daily - 3 reps - 20 seconds hold

## 2024-07-03 NOTE — Therapy (Signed)
 OUTPATIENT PHYSICAL THERAPY TREATMENT   Patient Name: [name] MRN: [id number] DOB:09-18-1950, 73 y.o., female Today's Date: 07/03/2024   END OF SESSION:  PT End of Session - 07/03/24 1530     Visit Number 7    Number of Visits 11    Date for Recertification  07/31/24    Authorization Type MCR    Progress Note Due on Visit 10    PT Start Time 1520    PT Stop Time 1600    PT Time Calculation (min) 40 min    Activity Tolerance Patient tolerated treatment well    Behavior During Therapy WFL for tasks assessed/performed               Past Medical History:  Diagnosis Date   ALLERGIC RHINITIS    ANEMIA, PERNICIOUS    ANXIETY    BILIARY DYSKINESIA    CARPAL TUNNEL SYNDROME, LEFT    DYSLIPIDEMIA    Headache(784.0)    HYPERTENSION    Irritable bowel syndrome    Non-celiac gluten sensitivity    OSTEOPENIA    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HCC)    borderline   Past Surgical History:  Procedure Laterality Date   APPENDECTOMY     CARPAL TUNNEL RELEASE     CESAREAN SECTION  76 & 85   x's 2    CHOLECYSTECTOMY  2005   Dr. Gail (Gallbladder dyskinesia)   COLONOSCOPY     DILATION AND CURETTAGE OF UTERUS     LEFT HEART CATHETERIZATION WITH CORONARY ANGIOGRAM N/A 05/19/2012   Procedure: LEFT HEART CATHETERIZATION WITH CORONARY ANGIOGRAM;  Surgeon: Debby JONETTA Como, MD;  Location: [facility] CATH [facility];  Service: Cardiovascular;  Laterality: N/A;   ROTATOR CUFF REPAIR     TONSILLECTOMY     UPPER GASTROINTESTINAL ENDOSCOPY     Patient Active Problem List   Diagnosis Date Noted   Abdominal bloating 01/13/2024   Acute left-sided low back pain without sciatica 01/12/2024   Eczema 07/06/2023   Abnormal cervical Papanicolaou smear 07/06/2023   Neck pain on left side 05/21/2023   Acute pain of right shoulder 04/09/2023   Petechial rash 01/04/2023   Macular degeneration, early 07/10/2022   Palpitations 07/03/2022   Baker cyst, left 06/05/2022   Chronic low back pain  06/05/2022   Arthralgia 05/20/2022   Gait abnormality 05/14/2022   Thiamine  deficiency 05/14/2022   Reactive arthritis of right knee (HCC) 04/14/2022   Aortic atherosclerosis 04/06/2022   Myalgia 04/06/2022   Arthralgia of both knees 04/06/2022   Salivary gland swelling 12/02/2020   Fall 02/13/2020   Left arm pain 02/13/2020   Hyperglycemia 06/07/2019   DDD (degenerative disc disease), cervical 02/02/2019   Dysuria 12/12/2018   Musculoskeletal neck pain 10/14/2018   Diverticulitis 08/11/2018   Shoulder pain, left 02/23/2017   B12 deficiency 08/07/2016   Recurrent UTI 11/08/2015   Non-celiac gluten sensitivity    Urinary frequency 01/10/2015   Anxiety state 01/17/2010   Allergic rhinitis 01/17/2010   Dyslipidemia 07/12/2009   Essential hypertension 07/12/2009   Osteoporosis 06/22/2008   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   ANEMIA, PERNICIOUS 10/14/2007   IRRITABLE BOWEL SYNDROME 10/14/2007    PCP: Geofm Glade JINNY, MD  REFERRING PROVIDER: Leonce Katz, DO  REFERRING DIAG: Chronic right shoulder pain; Subacromial bursitis of right shoulder joint; Chronic bilateral low back pain with left-sided sciatica  THERAPY DIAG:  Chronic right shoulder pain  Other low back pain  Pain in left foot  Chronic pain of both knees  Muscle weakness (generalized)  Rationale for Evaluation and Treatment: Rehabilitation  ONSET DATE: Chronic   SUBJECTIVE SUBJECTIVE STATEMENT: Patient reports her foot is better, a little sore but not like it was. She reports the left lower back and back of the hip are typically sore in the morning. She also notes that lying on the left side can wake her up at night. She did walk on the treadmill today for 40 minutes.  Eval: Patient was seeing the doctor for her right shoulder and lower back, but is also reporting pain in her left heel today and her knees hurt. Regarding her right shoulder she was playing a pool game and injured the right  shoulder a few years ago. She did have a shot to sort of took care of the right shoulder but then she reached back in her car and felt a pop that was painful. She has also had two injections in her back which has helped but did not get rid of it completely.   Hand dominance: Right  PERTINENT HISTORY: See PMH above  PAIN:  Are you having pain? Yes:  NPRS scale: 0/10 Pain location: Right shoulder Pain description: Sharp Aggravating factors: Moving the shoulder in certain directions Relieving factors: Rest  NPRS scale: 0/10, 3/10 at worst Pain location: Left lower back / hip Pain description: Ache Aggravating factors: Sitting, squatting down or stooping Relieving factors: Rest  NPRS scale: 0/10 at rest, 2-3/10 with walking Pain location: Left heel Pain description: Sore Aggravating factors: Walking Relieving factors: Rest  PRECAUTIONS: None  PATIENT GOALS: Pain relief   OBJECTIVE:  Note: Objective measures were completed at Evaluation unless otherwise noted. PATIENT SURVEYS:  PSFS: 8.2 Getting up from sitting/ lower back aches: 9 Sometimes walking up stairs: 8 Opening jars: 8 Carrying laundry basket upstairs: 9 Working in garden: 7  07/03/2024: PSFS: 9 Getting up from sitting/ lower back aches: 10 Sometimes walking up stairs: 9 Opening jars: 9 Carrying laundry basket upstairs: 9 Working in garden: 8  POSTURE: Rounded shoulder posture  UPPER EXTREMITY ROM:   Active ROM Right eval Left eval  Shoulder flexion 160 160  Shoulder extension    Shoulder abduction 170 170  Shoulder adduction    Shoulder internal rotation T7 T7  Shoulder external rotation 65 / T2 70 / T2  Elbow flexion    Elbow extension    Wrist flexion    Wrist extension    Wrist ulnar deviation    Wrist radial deviation    Wrist pronation    Wrist supination    (Blank rows = not tested)  UPPER EXTREMITY MMT:  MMT Right eval Left eval Right 07/03/2024  Shoulder flexion 4+ 5 5   Shoulder extension 5 5   Shoulder abduction 5 5   Shoulder adduction     Shoulder internal rotation 5 5   Shoulder external rotation 4+ 5 5  Middle trapezius     Lower trapezius     Elbow flexion 5 5   Elbow extension     Wrist flexion     Wrist extension     Wrist ulnar deviation     Wrist radial deviation     Wrist pronation     Wrist supination     Grip strength (lbs)     (Blank rows = not tested)  LUMBAR ROM:   Active  A/PROM  eval  Flexion WFL  Extension 75%  Right lateral flexion WFL  Left lateral flexion WFL  Right rotation [facility]  Left rotation WFL   (Blank rows = not tested)  LOWER EXTREMITY MMT:    MMT Right eval Left eval Left 05/15/2024 Left 05/29/2024 Left 07/03/2024  Hip flexion       Hip extension 4- 4-   4  Hip abduction 4- 4-   4-  Hip adduction       Hip internal rotation       Hip external rotation       Knee flexion       Knee extension       Ankle dorsiflexion       Ankle plantarflexion 5 4 4 4  4+  Ankle inversion   4 4 4+  Ankle eversion   4     (Blank rows = not tested)   JOINT MOBILITY TESTING:  Not assessed  PALPATION:  Tender to right upper trap region                                                                                                                             TREATMENT [facility] Adult PT Treatment:                                                DATE: 07/03/2024 Piriformis stretch 2 x 20 sec each Figure-4 bridge x 10 each Bridge with blue 10 x 3 sec Side clamshell with blue 2 x 10 each Slant board calf stretch 2 x 30 sec Heel raises on edge of step with ball squeeze between heels 2 x 10 Lateral band walk with blue at knees 2 x 20 down/back  PATIENT EDUCATION: Education details: POC extension, HEP update Person educated: Patient Education method: Explanation, Demonstration, Tactile cues, Verbal cues, and Handouts Education comprehension: verbalized understanding, returned demonstration, verbal cues required,  tactile cues required, and needs further education  HOME EXERCISE PROGRAM: Access Code: [email or web address]   ASSESSMENT: CLINICAL IMPRESSION: Patient tolerated therapy well with no adverse effects. Therapy focused on progressing her hip and ankle strengthening this visit with good tolerance. She does report improvement in her left heel symptoms and demonstrates improvement in her right shoulder, left hip and ankle strength. Patient does report overall improvement in her functional status this visit. She was able to progress to a stronger band for her hip strengthening this visit and updated HEP to progress stretching and strengthening for home. Patient would benefit from continued skilled PT to progress mobility and strength in order to reduce pain and maximize functional ability.   Eval: Patient is a 73 y.o. female who was seen today for physical therapy evaluation and treatment for right shoulder, lower back, left heel, and bilateral knee pain. Her primary complaint of pain today seemed to be her right heel from when she was walking on the treadmill earlier. She does exhibit some calf tightness and weakness. She also  exhibit strength deficit of the right shoulder and hips that are likely contributing to her symptoms. Her back and right shoulder move well but she does report some tightness and tenderness of the right upper trap region.  OBJECTIVE IMPAIRMENTS: decreased activity tolerance, decreased ROM, decreased strength, impaired flexibility, postural dysfunction, and pain.   ACTIVITY LIMITATIONS: lifting, bending, sitting, squatting, reach over head, and locomotion level  PARTICIPATION LIMITATIONS: meal prep, cleaning, community activity, and yard work  PERSONAL FACTORS: Fitness, Past/current experiences, and Time since onset of injury/illness/exacerbation are also affecting patient's functional outcome.    GOALS: Goals reviewed with patient? Yes  SHORT TERM GOALS: Target date:  05/29/2024  Patient will be I with initial HEP in order to progress with therapy. Baseline: HEP provided at eval 05/29/2024: independent with initial HEP Goal status: MET  2.  Patient will report pain level with activity </= 1/10 in order to reduce functional limitations Baseline: see pain score above 05/29/2024: continue to report left heel pain with activity Goal status: MET  LONG TERM GOALS: Target date: 07/31/2024  Patient will be I with final HEP to maintain progress from PT. Baseline: HEP provided at eval 07/03/2024: progressing Goal status: ONGOING  2.  Patient will report PSFS >/= 9 in order to indicate improvement in their functional ability. Baseline: 8.2 07/03/2024: 9 Goal status: MET  3.  Patient will demonstrate hip strength grossly >/= 4/5 MMT to improve activity tolerance and reduce pain Baseline: grossly 4-/5 MMT 07/03/2024: see limitations above Goal status: ONGOING  4.  Patient will demonstrate right shoulder strength 5/5 MMT in order to improve activity tolerance with right shoulder Baseline: see limitations above 07/03/2024: 5/5 Goal status: MET   PLAN: PT FREQUENCY: 1x/week  PT DURATION: 4 weeks  PLANNED INTERVENTIONS: 97164- PT Re-evaluation, 97750- Physical Performance Testing, 97110-Therapeutic exercises, 97530- Therapeutic activity, 97112- Neuromuscular re-education, 97535- Self Care, [id number]- Manual therapy, Patient/Family education, Balance training, Stair training, Taping, Joint mobilization, Joint manipulation, Spinal manipulation, Spinal mobilization, Cryotherapy, and Moist heat  PLAN FOR NEXT SESSION: Review HEP and progress PRN; manual for right upper trap region; strengthening for right shoulder, core, hips, and LE; postural control; dynamic balance   Elaine Daring, PT, DPT, LAT, ATC 07/03/24  4:40 PM Phone: [phone] Fax: [phone]

## 2024-07-04 ENCOUNTER — Encounter: Payer: Self-pay | Admitting: Internal Medicine

## 2024-07-04 DIAGNOSIS — E559 Vitamin D deficiency, unspecified: Secondary | ICD-10-CM | POA: Insufficient documentation

## 2024-07-04 NOTE — Progress Notes (Unsigned)
 Subjective:    Patient ID: [name], female    DOB: 1951/06/17, 73 y.o.   MRN: [id number]     HPI [name] is here for follow up of her chronic medical problems.  State of mind has been up and down - probably related to how her body feels.  Doing PT for different things-back pain, shoulder pain, Achilles-this has helped.  [name] has also had a good friend's husband died in her son's been sick over the last year which are all likely contributing  Doing tai chi 4 days a week, walking  Medications and allergies reviewed with patient and updated if appropriate.  Current Outpatient Medications on File Prior to Visit  Medication Sig Dispense Refill   ALPRAZolam  (XANAX ) 0.25 MG tablet TAKE 1 TABLET(0.25 MG) BY MOUTH TWICE DAILY AS NEEDED FOR ANXIETY 30 tablet 0   amLODipine  (NORVASC ) 10 MG tablet TAKE 1 TABLET(10 MG) BY MOUTH DAILY 90 tablet 2   CALCIUM  CITRATE PO Take by mouth.     cetirizine (ZYRTEC) 10 MG tablet Take 10 mg by mouth daily.     Cholecalciferol  (VITAMIN D -3 PO) Take by mouth.     hydrochlorothiazide  (HYDRODIURIL ) 12.5 MG tablet TAKE 1 TABLET(12.5 MG) BY MOUTH DAILY 90 tablet 3   methocarbamol  (ROBAXIN ) 500 MG tablet      pravastatin  (PRAVACHOL ) 40 MG tablet TAKE 1 TABLET(40 MG) BY MOUTH DAILY 90 tablet 2   No current facility-administered medications on file prior to visit.     Review of Systems  Constitutional:  Negative for fever.  Respiratory:  Positive for shortness of breath (occ with going up stairs). Negative for cough and wheezing.   Cardiovascular:  Negative for chest pain, palpitations and leg swelling.  Neurological:  Positive for headaches (occ). Negative for light-headedness.       Objective:   Vitals:   07/05/24 0848  BP: 120/78  Pulse: 68  Temp: 97.9 F (36.6 C)  SpO2: 97%   BP Readings from Last 3 Encounters:  07/05/24 120/78  06/26/24 118/72  04/25/24 130/60   Wt Readings from Last 3 Encounters:  07/05/24 116 lb (52.6 kg)   06/26/24 117 lb (53.1 kg)  04/04/24 117 lb (53.1 kg)   Body mass index is 24.24 kg/m.    Physical Exam Constitutional:      General: [name] is not in acute distress.    Appearance: Normal appearance.  HENT:     Head: Normocephalic and atraumatic.  Eyes:     Conjunctiva/sclera: Conjunctivae normal.  Cardiovascular:     Rate and Rhythm: Normal rate and regular rhythm.     Heart sounds: Normal heart sounds.  Pulmonary:     Effort: Pulmonary effort is normal. No respiratory distress.     Breath sounds: Normal breath sounds. No wheezing.  Musculoskeletal:     Cervical back: Neck supple.     Right lower leg: No edema.     Left lower leg: No edema.  Lymphadenopathy:     Cervical: No cervical adenopathy.  Skin:    General: Skin is warm and dry.     Findings: No rash.  Neurological:     Mental Status: [name] is alert. Mental status is at baseline.  Psychiatric:        Mood and Affect: Mood normal.        Behavior: Behavior normal.        Lab Results  Component Value Date   WBC 7.2 03/08/2024   HGB  13.1 03/08/2024   HCT 37.9 03/08/2024   PLT 211.0 03/08/2024   GLUCOSE 96 03/08/2024   CHOL 230 (H) 01/04/2024   TRIG 66.0 01/04/2024   HDL 101.70 01/04/2024   LDLDIRECT 87.0 09/16/2012   LDLCALC 115 (H) 01/04/2024   ALT 13 03/08/2024   AST 19 03/08/2024   NA 129 (L) 03/08/2024   K 3.6 03/08/2024   CL 90 (L) 03/08/2024   CREATININE 0.69 03/08/2024   BUN 12 03/08/2024   CO2 30 03/08/2024   TSH 1.57 03/08/2024   INR 1.09 05/19/2012   HGBA1C 5.1 06/26/2021     Assessment & Plan:    See Problem List for Assessment and Plan of chronic medical problems.

## 2024-07-04 NOTE — Patient Instructions (Addendum)
     Blood work was ordered.       Medications changes include :   None    Return in about 6 months (around 01/03/2025) for Physical Exam.

## 2024-07-05 ENCOUNTER — Ambulatory Visit (INDEPENDENT_AMBULATORY_CARE_PROVIDER_SITE_OTHER): Admitting: Internal Medicine

## 2024-07-05 ENCOUNTER — Encounter: Payer: Self-pay | Admitting: Internal Medicine

## 2024-07-05 VITALS — BP 120/78 | HR 68 | Temp 97.9°F | Ht <= 58 in | Wt 116.0 lb

## 2024-07-05 DIAGNOSIS — I10 Essential (primary) hypertension: Secondary | ICD-10-CM

## 2024-07-05 DIAGNOSIS — E785 Hyperlipidemia, unspecified: Secondary | ICD-10-CM | POA: Diagnosis not present

## 2024-07-05 DIAGNOSIS — F411 Generalized anxiety disorder: Secondary | ICD-10-CM | POA: Diagnosis not present

## 2024-07-05 DIAGNOSIS — E538 Deficiency of other specified B group vitamins: Secondary | ICD-10-CM

## 2024-07-05 DIAGNOSIS — Z23 Encounter for immunization: Secondary | ICD-10-CM | POA: Diagnosis not present

## 2024-07-05 DIAGNOSIS — R739 Hyperglycemia, unspecified: Secondary | ICD-10-CM | POA: Diagnosis not present

## 2024-07-05 DIAGNOSIS — E559 Vitamin D deficiency, unspecified: Secondary | ICD-10-CM

## 2024-07-05 LAB — CBC WITH DIFFERENTIAL/PLATELET
Basophils Absolute: 0 K/uL (ref 0.0–0.1)
Basophils Relative: 0.4 % (ref 0.0–3.0)
Eosinophils Absolute: 0.1 K/uL (ref 0.0–0.7)
Eosinophils Relative: 0.9 % (ref 0.0–5.0)
HCT: 39.6 % (ref 36.0–46.0)
Hemoglobin: 13.5 g/dL (ref 12.0–15.0)
Lymphocytes Relative: 14.3 % (ref 12.0–46.0)
Lymphs Abs: 1.1 K/uL (ref 0.7–4.0)
MCHC: 34.1 g/dL (ref 30.0–36.0)
MCV: 91.6 fl (ref 78.0–100.0)
Monocytes Absolute: 0.5 K/uL (ref 0.1–1.0)
Monocytes Relative: 7 % (ref 3.0–12.0)
Neutro Abs: 5.8 K/uL (ref 1.4–7.7)
Neutrophils Relative %: 77.4 % — ABNORMAL HIGH (ref 43.0–77.0)
Platelets: 251 K/uL (ref 150.0–400.0)
RBC: 4.33 Mil/uL (ref 3.87–5.11)
RDW: 13.1 % (ref 11.5–15.5)
WBC: 7.5 K/uL (ref 4.0–10.5)

## 2024-07-05 LAB — COMPREHENSIVE METABOLIC PANEL WITH GFR
ALT: 16 U/L (ref 0–35)
AST: 20 U/L (ref 0–37)
Albumin: 4.9 g/dL (ref 3.5–5.2)
Alkaline Phosphatase: 74 U/L (ref 39–117)
BUN: 5 mg/dL — ABNORMAL LOW (ref 6–23)
CO2: 29 meq/L (ref 19–32)
Calcium: 9.7 mg/dL (ref 8.4–10.5)
Chloride: 95 meq/L — ABNORMAL LOW (ref 96–112)
Creatinine, Ser: 0.64 mg/dL (ref 0.40–1.20)
GFR: 89.01 mL/min (ref >60.00)
Glucose, Bld: 106 mg/dL — ABNORMAL HIGH (ref 70–99)
Potassium: 4 meq/L (ref 3.5–5.1)
Sodium: 133 meq/L — ABNORMAL LOW (ref 135–145)
Total Bilirubin: 0.7 mg/dL (ref 0.2–1.2)
Total Protein: 7 g/dL (ref 6.0–8.3)

## 2024-07-05 LAB — LIPID PANEL
Cholesterol: 211 mg/dL — ABNORMAL HIGH (ref 0–200)
HDL: 109.9 mg/dL (ref >39.00)
LDL Cholesterol: 84 mg/dL (ref 0–99)
NonHDL: 100.98
Total CHOL/HDL Ratio: 2
Triglycerides: 86 mg/dL (ref 0.0–149.0)
VLDL: 17.2 mg/dL (ref 0.0–40.0)

## 2024-07-05 LAB — VITAMIN D 25 HYDROXY (VIT D DEFICIENCY, FRACTURES): VITD: 33.98 ng/mL (ref 30.00–100.00)

## 2024-07-05 LAB — VITAMIN B12: Vitamin B-12: 305 pg/mL (ref 211–911)

## 2024-07-05 LAB — HEMOGLOBIN A1C: Hgb A1c MFr Bld: 5.6 % (ref 4.6–6.5)

## 2024-07-05 MED ORDER — METHOCARBAMOL 500 MG PO TABS: 500.0000 mg | ORAL_TABLET | Freq: Three times a day (TID) | ORAL | 1 refills | Status: AC | PRN

## 2024-07-05 NOTE — Assessment & Plan Note (Signed)
 Chronic Blood pressure controlled No change in medications today Monitor BP Continue amlodipine  10 mg daily, hctz 12.5 mg daily Cmp, cbc

## 2024-07-05 NOTE — Assessment & Plan Note (Signed)
 Chronic Taking vitamin d daily Check vitamin d level

## 2024-07-05 NOTE — Assessment & Plan Note (Signed)
Chronic Taking B12 daily Check level 

## 2024-07-05 NOTE — Assessment & Plan Note (Signed)
Chronic Check lipid panel, cmp  Continue pravastatin 40 mg daily Regular exercise and healthy diet encouraged

## 2024-07-05 NOTE — Assessment & Plan Note (Addendum)
 Chronic Her mood has been a little variable for a little while and she thinks that is related to everything that happened over the past several months Anxiety she feels a stable and we will continue her current Continue xanax  0.25 mg bid prn-takes infrequently

## 2024-07-05 NOTE — Assessment & Plan Note (Signed)
 Chronic Lab Results  Component Value Date   HGBA1C 5.1 06/26/2021   Check a1c Low sugar / carb diet Stressed regular exercise

## 2024-07-06 ENCOUNTER — Ambulatory Visit: Admitting: Internal Medicine

## 2024-07-06 ENCOUNTER — Other Ambulatory Visit: Payer: Self-pay | Admitting: Internal Medicine

## 2024-07-06 ENCOUNTER — Ambulatory Visit: Payer: Self-pay | Admitting: Internal Medicine

## 2024-07-10 ENCOUNTER — Other Ambulatory Visit: Payer: Self-pay

## 2024-07-10 ENCOUNTER — Ambulatory Visit (INDEPENDENT_AMBULATORY_CARE_PROVIDER_SITE_OTHER): Admitting: Physical Therapy

## 2024-07-10 ENCOUNTER — Encounter: Payer: Self-pay | Admitting: Physical Therapy

## 2024-07-10 DIAGNOSIS — M6281 Muscle weakness (generalized): Secondary | ICD-10-CM | POA: Diagnosis not present

## 2024-07-10 DIAGNOSIS — G8929 Other chronic pain: Secondary | ICD-10-CM

## 2024-07-10 DIAGNOSIS — M25511 Pain in right shoulder: Secondary | ICD-10-CM | POA: Diagnosis not present

## 2024-07-10 DIAGNOSIS — M25562 Pain in left knee: Secondary | ICD-10-CM

## 2024-07-10 DIAGNOSIS — M5459 Other low back pain: Secondary | ICD-10-CM | POA: Diagnosis not present

## 2024-07-10 DIAGNOSIS — M25561 Pain in right knee: Secondary | ICD-10-CM | POA: Diagnosis not present

## 2024-07-10 DIAGNOSIS — M79672 Pain in left foot: Secondary | ICD-10-CM

## 2024-07-10 NOTE — Therapy (Signed)
 OUTPATIENT PHYSICAL THERAPY TREATMENT   Patient Name: [name] MRN: [id number] DOB:1951-02-25, 73 y.o., female Today's Date: 07/10/2024   END OF SESSION:  PT End of Session - 07/10/24 1354     Visit Number 8    Number of Visits 11    Date for Recertification  07/31/24    Authorization Type MCR    Progress Note Due on Visit 10    PT Start Time 1350    PT Stop Time 1430    PT Time Calculation (min) 40 min    Activity Tolerance Patient tolerated treatment well    Behavior During Therapy WFL for tasks assessed/performed               Past Medical History:  Diagnosis Date   ALLERGIC RHINITIS    ANEMIA, PERNICIOUS    ANXIETY    BILIARY DYSKINESIA    CARPAL TUNNEL SYNDROME, LEFT    DYSLIPIDEMIA    Headache(784.0)    HYPERTENSION    Irritable bowel syndrome    Non-celiac gluten sensitivity    OSTEOPENIA    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HCC)    borderline   Past Surgical History:  Procedure Laterality Date   APPENDECTOMY     CARPAL TUNNEL RELEASE     CESAREAN SECTION  74 & 83   x's 2    CHOLECYSTECTOMY  2005   Dr. Gail (Gallbladder dyskinesia)   COLONOSCOPY     DILATION AND CURETTAGE OF UTERUS     LEFT HEART CATHETERIZATION WITH CORONARY ANGIOGRAM N/A 05/19/2012   Procedure: LEFT HEART CATHETERIZATION WITH CORONARY ANGIOGRAM;  Surgeon: Debby JONETTA Como, MD;  Location: [facility] CATH LAB;  Service: Cardiovascular;  Laterality: N/A;   ROTATOR CUFF REPAIR     TONSILLECTOMY     UPPER GASTROINTESTINAL ENDOSCOPY     Patient Active Problem List   Diagnosis Date Noted   Vitamin D  deficiency 07/04/2024   Abdominal bloating 01/13/2024   Acute left-sided low back pain without sciatica 01/12/2024   Eczema 07/06/2023   Abnormal cervical Papanicolaou smear 07/06/2023   Neck pain on left side 05/21/2023   Acute pain of right shoulder 04/09/2023   Petechial rash 01/04/2023   Macular degeneration, early 07/10/2022   Palpitations 07/03/2022   Baker cyst, left  06/05/2022   Chronic low back pain 06/05/2022   Arthralgia 05/20/2022   Gait abnormality 05/14/2022   Thiamine  deficiency 05/14/2022   Reactive arthritis of right knee (HCC) 04/14/2022   Aortic atherosclerosis 04/06/2022   Myalgia 04/06/2022   Arthralgia of both knees 04/06/2022   Salivary gland swelling 12/02/2020   Fall 02/13/2020   Left arm pain 02/13/2020   Hyperglycemia 06/07/2019   DDD (degenerative disc disease), cervical 02/02/2019   Dysuria 12/12/2018   Musculoskeletal neck pain 10/14/2018   Diverticulitis 08/11/2018   Shoulder pain, left 02/23/2017   B12 deficiency 08/07/2016   Recurrent UTI 11/08/2015   Non-celiac gluten sensitivity    Urinary frequency 01/10/2015   Anxiety state 01/17/2010   Allergic rhinitis 01/17/2010   Dyslipidemia 07/12/2009   Essential hypertension 07/12/2009   Osteoporosis 06/22/2008   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   ANEMIA, PERNICIOUS 10/14/2007   IRRITABLE BOWEL SYNDROME 10/14/2007    PCP: Geofm Glade JINNY, MD  REFERRING PROVIDER: Leonce Katz, DO  REFERRING DIAG: Chronic right shoulder pain; Subacromial bursitis of right shoulder joint; Chronic bilateral low back pain with left-sided sciatica  THERAPY DIAG:  Chronic right shoulder pain  Other low back pain  Pain in left foot  Chronic pain of both knees  Muscle weakness (generalized)  Rationale for Evaluation and Treatment: Rehabilitation  ONSET DATE: Chronic   SUBJECTIVE SUBJECTIVE STATEMENT: Patient reports her foot is a little sore, but otherwise she is doing well. She did not do her exercises last week.   Eval: Patient was seeing the doctor for her right shoulder and lower back, but is also reporting pain in her left heel today and her knees hurt. Regarding her right shoulder she was playing a pool game and injured the right shoulder a few years ago. She did have a shot to sort of took care of the right shoulder but then she reached back in her car  and felt a pop that was painful. She has also had two injections in her back which has helped but did not get rid of it completely.   Hand dominance: Right  PERTINENT HISTORY: See PMH above  PAIN:  Are you having pain? Yes:  NPRS scale: 0/10 Pain location: Right shoulder Pain description: Sharp Aggravating factors: Moving the shoulder in certain directions Relieving factors: Rest  NPRS scale: 0/10, 3/10 at worst Pain location: Left lower back / hip Pain description: Ache Aggravating factors: Sitting, squatting down or stooping Relieving factors: Rest  NPRS scale: 0/10 at rest, 2-3/10 with walking Pain location: Left heel Pain description: Sore Aggravating factors: Walking Relieving factors: Rest  PRECAUTIONS: None  PATIENT GOALS: Pain relief   OBJECTIVE:  Note: Objective measures were completed at Evaluation unless otherwise noted. PATIENT SURVEYS:  PSFS: 8.2 Getting up from sitting/ lower back aches: 9 Sometimes walking up stairs: 8 Opening jars: 8 Carrying laundry basket upstairs: 9 Working in garden: 7  07/03/2024: PSFS: 9 Getting up from sitting/ lower back aches: 10 Sometimes walking up stairs: 9 Opening jars: 9 Carrying laundry basket upstairs: 9 Working in garden: 8  POSTURE: Rounded shoulder posture  UPPER EXTREMITY ROM:   Active ROM Right eval Left eval  Shoulder flexion 160 160  Shoulder extension    Shoulder abduction 170 170  Shoulder adduction    Shoulder internal rotation T7 T7  Shoulder external rotation 65 / T2 70 / T2  Elbow flexion    Elbow extension    Wrist flexion    Wrist extension    Wrist ulnar deviation    Wrist radial deviation    Wrist pronation    Wrist supination    (Blank rows = not tested)  UPPER EXTREMITY MMT:  MMT Right eval Left eval Right 07/03/2024  Shoulder flexion 4+ 5 5  Shoulder extension 5 5   Shoulder abduction 5 5   Shoulder adduction     Shoulder internal rotation 5 5   Shoulder external  rotation 4+ 5 5  Middle trapezius     Lower trapezius     Elbow flexion 5 5   Elbow extension     Wrist flexion     Wrist extension     Wrist ulnar deviation     Wrist radial deviation     Wrist pronation     Wrist supination     Grip strength (lbs)     (Blank rows = not tested)  LUMBAR ROM:   Active  A/PROM  eval  Flexion WFL  Extension 75%  Right lateral flexion WFL  Left lateral flexion WFL  Right rotation WFL  Left rotation WFL   (Blank rows = not tested)  LOWER EXTREMITY MMT:    MMT Right eval Left eval Left 05/15/2024 Left 05/29/2024  Left 07/03/2024  Hip flexion       Hip extension 4- 4-   4  Hip abduction 4- 4-   4-  Hip adduction       Hip internal rotation       Hip external rotation       Knee flexion       Knee extension       Ankle dorsiflexion       Ankle plantarflexion 5 4 4 4  4+  Ankle inversion   4 4 4+  Ankle eversion   4     (Blank rows = not tested)   JOINT MOBILITY TESTING:  Not assessed  PALPATION:  Tender to right upper trap region                                                                                                                             TREATMENT [facility] Adult PT Treatment:                                                DATE: 07/10/2024 Front foot elevated heel raise 3 x 10 each Forward 10 runner step-up 2 x 10 each Lateral band walk with blue at knees 3 x 20 down/back Goblet squat to table tap 3 x 10 SLS 2 x 60 sec each Bridge with alternating knee extension x 10  PATIENT EDUCATION: Education details: HEP Person educated: Patient Education method: Programmer, Multimedia, Facilities Manager, Actor cues, Verbal cues Education comprehension: verbalized understanding, returned demonstration, verbal cues required, tactile cues required, and needs further education  HOME EXERCISE PROGRAM: Access Code: [email or web address]   ASSESSMENT: CLINICAL IMPRESSION: Patient tolerated therapy well with no adverse effects. Therapy focused on  progressing strength exercises for the core, hips, knees, and ankles with good tolerance. She was able to progress with her LE strength with increased step height and added weight to squats. She did exhibit some difficulty with her single leg balance. No increase in pain reported with therapy. No changes to her HEP this visit. Patient would benefit from continued skilled PT to progress mobility and strength in order to reduce pain and maximize functional ability.   Eval: Patient is a 73 y.o. female who was seen today for physical therapy evaluation and treatment for right shoulder, lower back, left heel, and bilateral knee pain. Her primary complaint of pain today seemed to be her right heel from when she was walking on the treadmill earlier. She does exhibit some calf tightness and weakness. She also exhibit strength deficit of the right shoulder and hips that are likely contributing to her symptoms. Her back and right shoulder move well but she does report some tightness and tenderness of the right upper trap region.  OBJECTIVE IMPAIRMENTS: decreased activity tolerance, decreased ROM, decreased strength, impaired flexibility, postural dysfunction, and pain.  ACTIVITY LIMITATIONS: lifting, bending, sitting, squatting, reach over head, and locomotion level  PARTICIPATION LIMITATIONS: meal prep, cleaning, community activity, and yard work  PERSONAL FACTORS: Fitness, Past/current experiences, and Time since onset of injury/illness/exacerbation are also affecting patient's functional outcome.    GOALS: Goals reviewed with patient? Yes  SHORT TERM GOALS: Target date: 05/29/2024  Patient will be I with initial HEP in order to progress with therapy. Baseline: HEP provided at eval 05/29/2024: independent with initial HEP Goal status: MET  2.  Patient will report pain level with activity </= 1/10 in order to reduce functional limitations Baseline: see pain score above 05/29/2024: continue to report  left heel pain with activity Goal status: MET  LONG TERM GOALS: Target date: 07/31/2024  Patient will be I with final HEP to maintain progress from PT. Baseline: HEP provided at eval 07/03/2024: progressing Goal status: ONGOING  2.  Patient will report PSFS >/= 9 in order to indicate improvement in their functional ability. Baseline: 8.2 07/03/2024: 9 Goal status: MET  3.  Patient will demonstrate hip strength grossly >/= 4/5 MMT to improve activity tolerance and reduce pain Baseline: grossly 4-/5 MMT 07/03/2024: see limitations above Goal status: ONGOING  4.  Patient will demonstrate right shoulder strength 5/5 MMT in order to improve activity tolerance with right shoulder Baseline: see limitations above 07/03/2024: 5/5 Goal status: MET   PLAN: PT FREQUENCY: 1x/week  PT DURATION: 4 weeks  PLANNED INTERVENTIONS: 97164- PT Re-evaluation, 97750- Physical Performance Testing, 97110-Therapeutic exercises, 97530- Therapeutic activity, 97112- Neuromuscular re-education, 97535- Self Care, [id number]- Manual therapy, Patient/Family education, Balance training, Stair training, Taping, Joint mobilization, Joint manipulation, Spinal manipulation, Spinal mobilization, Cryotherapy, and Moist heat  PLAN FOR NEXT SESSION: Review HEP and progress PRN; manual for right upper trap region; strengthening for right shoulder, core, hips, and LE; postural control; dynamic balance   Elaine Daring, PT, DPT, LAT, ATC 07/10/24  2:34 PM Phone: [phone] Fax: [phone]

## 2024-07-13 DIAGNOSIS — Z23 Encounter for immunization: Secondary | ICD-10-CM | POA: Diagnosis not present

## 2024-07-14 ENCOUNTER — Encounter: Payer: Self-pay | Admitting: Internal Medicine

## 2024-07-17 ENCOUNTER — Ambulatory Visit: Admitting: Physical Therapy

## 2024-07-17 ENCOUNTER — Other Ambulatory Visit: Payer: Self-pay

## 2024-07-17 ENCOUNTER — Encounter: Payer: Self-pay | Admitting: Physical Therapy

## 2024-07-17 DIAGNOSIS — M6281 Muscle weakness (generalized): Secondary | ICD-10-CM | POA: Diagnosis not present

## 2024-07-17 DIAGNOSIS — M79672 Pain in left foot: Secondary | ICD-10-CM | POA: Diagnosis not present

## 2024-07-17 DIAGNOSIS — M5459 Other low back pain: Secondary | ICD-10-CM

## 2024-07-17 DIAGNOSIS — M25561 Pain in right knee: Secondary | ICD-10-CM | POA: Diagnosis not present

## 2024-07-17 DIAGNOSIS — G8929 Other chronic pain: Secondary | ICD-10-CM

## 2024-07-17 DIAGNOSIS — M25562 Pain in left knee: Secondary | ICD-10-CM | POA: Diagnosis not present

## 2024-07-17 DIAGNOSIS — M25511 Pain in right shoulder: Secondary | ICD-10-CM | POA: Diagnosis not present

## 2024-07-17 NOTE — Therapy (Signed)
 OUTPATIENT PHYSICAL THERAPY TREATMENT  DISCHARGE   Patient Name: [name] MRN: [id number] DOB:03-06-1951, 73 y.o., female Today's Date: 07/17/2024   END OF SESSION:  PT End of Session - 07/17/24 1442     Visit Number 9    Number of Visits 11    Date for Recertification  07/31/24    Authorization Type MCR    Progress Note Due on Visit 10    PT Start Time 1430    PT Stop Time 1510    PT Time Calculation (min) 40 min    Activity Tolerance Patient tolerated treatment well    Behavior During Therapy WFL for tasks assessed/performed                Past Medical History:  Diagnosis Date   ALLERGIC RHINITIS    ANEMIA, PERNICIOUS    ANXIETY    BILIARY DYSKINESIA    CARPAL TUNNEL SYNDROME, LEFT    DYSLIPIDEMIA    Headache(784.0)    HYPERTENSION    Irritable bowel syndrome    Non-celiac gluten sensitivity    OSTEOPENIA    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HCC)    borderline   Past Surgical History:  Procedure Laterality Date   APPENDECTOMY     CARPAL TUNNEL RELEASE     CESAREAN SECTION  74 & 83   x's 2    CHOLECYSTECTOMY  2005   Dr. Gail (Gallbladder dyskinesia)   COLONOSCOPY     DILATION AND CURETTAGE OF UTERUS     LEFT HEART CATHETERIZATION WITH CORONARY ANGIOGRAM N/A 05/19/2012   Procedure: LEFT HEART CATHETERIZATION WITH CORONARY ANGIOGRAM;  Surgeon: Debby JONETTA Como, MD;  Location: [facility] CATH LAB;  Service: Cardiovascular;  Laterality: N/A;   ROTATOR CUFF REPAIR     TONSILLECTOMY     UPPER GASTROINTESTINAL ENDOSCOPY     Patient Active Problem List   Diagnosis Date Noted   Vitamin D  deficiency 07/04/2024   Abdominal bloating 01/13/2024   Acute left-sided low back pain without sciatica 01/12/2024   Eczema 07/06/2023   Abnormal cervical Papanicolaou smear 07/06/2023   Neck pain on left side 05/21/2023   Acute pain of right shoulder 04/09/2023   Petechial rash 01/04/2023   Macular degeneration, early 07/10/2022   Palpitations 07/03/2022   Baker  cyst, left 06/05/2022   Chronic low back pain 06/05/2022   Arthralgia 05/20/2022   Gait abnormality 05/14/2022   Thiamine  deficiency 05/14/2022   Reactive arthritis of right knee (HCC) 04/14/2022   Aortic atherosclerosis 04/06/2022   Myalgia 04/06/2022   Arthralgia of both knees 04/06/2022   Salivary gland swelling 12/02/2020   Fall 02/13/2020   Left arm pain 02/13/2020   Hyperglycemia 06/07/2019   DDD (degenerative disc disease), cervical 02/02/2019   Dysuria 12/12/2018   Musculoskeletal neck pain 10/14/2018   Diverticulitis 08/11/2018   Shoulder pain, left 02/23/2017   B12 deficiency 08/07/2016   Recurrent UTI 11/08/2015   Non-celiac gluten sensitivity    Urinary frequency 01/10/2015   Anxiety state 01/17/2010   Allergic rhinitis 01/17/2010   Dyslipidemia 07/12/2009   Essential hypertension 07/12/2009   Osteoporosis 06/22/2008   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   ANEMIA, PERNICIOUS 10/14/2007   IRRITABLE BOWEL SYNDROME 10/14/2007    PCP: Geofm Glade JINNY, MD  REFERRING PROVIDER: Leonce Katz, DO  REFERRING DIAG: Chronic right shoulder pain; Subacromial bursitis of right shoulder joint; Chronic bilateral low back pain with left-sided sciatica  THERAPY DIAG:  Pain in left foot  Chronic right shoulder pain  Other  low back pain  Chronic pain of both knees  Muscle weakness (generalized)  Rationale for Evaluation and Treatment: Rehabilitation  ONSET DATE: Chronic   SUBJECTIVE SUBJECTIVE STATEMENT: Patient reports she is feeling better and is back to walking an hour 3x/week and is still working in her exercise classes. She did buy some loop bands for her exercises at home.  Eval: Patient was seeing the doctor for her right shoulder and lower back, but is also reporting pain in her left heel today and her knees hurt. Regarding her right shoulder she was playing a pool game and injured the right shoulder a few years ago. She did have a shot to sort  of took care of the right shoulder but then she reached back in her car and felt a pop that was painful. She has also had two injections in her back which has helped but did not get rid of it completely.   Hand dominance: Right  PERTINENT HISTORY: See PMH above  PAIN:  Are you having pain? Yes:  NPRS scale: 0/10 Pain location: Right shoulder Pain description: Sharp Aggravating factors: Moving the shoulder in certain directions Relieving factors: Rest  NPRS scale: 0/10, 3/10 at worst Pain location: Left lower back / hip Pain description: Ache Aggravating factors: Sitting, squatting down or stooping Relieving factors: Rest  NPRS scale: 0/10 at rest, 2-3/10 with walking Pain location: Left heel Pain description: Sore Aggravating factors: Walking Relieving factors: Rest  PRECAUTIONS: None  PATIENT GOALS: Pain relief   OBJECTIVE:  Note: Objective measures were completed at Evaluation unless otherwise noted. PATIENT SURVEYS:  PSFS: 8.2 Getting up from sitting/ lower back aches: 9 Sometimes walking up stairs: 8 Opening jars: 8 Carrying laundry basket upstairs: 9 Working in garden: 7  07/03/2024: PSFS: 9 Getting up from sitting/ lower back aches: 10 Sometimes walking up stairs: 9 Opening jars: 9 Carrying laundry basket upstairs: 9 Working in garden: 8  POSTURE: Rounded shoulder posture  UPPER EXTREMITY ROM:   Active ROM Right eval Left eval  Shoulder flexion 160 160  Shoulder extension    Shoulder abduction 170 170  Shoulder adduction    Shoulder internal rotation T7 T7  Shoulder external rotation 65 / T2 70 / T2  Elbow flexion    Elbow extension    Wrist flexion    Wrist extension    Wrist ulnar deviation    Wrist radial deviation    Wrist pronation    Wrist supination    (Blank rows = not tested)  UPPER EXTREMITY MMT:  MMT Right eval Left eval Right 07/03/2024  Shoulder flexion 4+ 5 5  Shoulder extension 5 5   Shoulder abduction 5 5    Shoulder adduction     Shoulder internal rotation 5 5   Shoulder external rotation 4+ 5 5  Middle trapezius     Lower trapezius     Elbow flexion 5 5   Elbow extension     Wrist flexion     Wrist extension     Wrist ulnar deviation     Wrist radial deviation     Wrist pronation     Wrist supination     Grip strength (lbs)     (Blank rows = not tested)  LUMBAR ROM:   Active  A/PROM  eval  Flexion WFL  Extension 75%  Right lateral flexion WFL  Left lateral flexion WFL  Right rotation WFL  Left rotation WFL   (Blank rows = not tested)  LOWER  EXTREMITY MMT:    MMT Right eval Left eval Left 05/15/2024 Left 05/29/2024 Left 07/03/2024 Rt / Lt 07/17/2024  Hip flexion        Hip extension 4- 4-   4 4 / 4  Hip abduction 4- 4-   4- 4 / 4  Hip adduction        Hip internal rotation        Hip external rotation        Knee flexion        Knee extension        Ankle dorsiflexion        Ankle plantarflexion 5 4 4 4  4+   Ankle inversion   4 4 4+   Ankle eversion   4      (Blank rows = not tested)   JOINT MOBILITY TESTING:  Not assessed  PALPATION:  Tender to right upper trap region                                                                                                                             TREATMENT [facility] Adult PT Treatment:                                                DATE: 07/17/2024 Piriformis stretch x 20 sec each Hooklying sciatic nerve floss x 10 each Bridge with loop band around knees x 10 Side clamshell with loop band x 10 each Squat to table tap with loop band at knees x 10 Front foot elevated heel raise 2 x 10 each Forward 10 runner step-up x 10 each Lateral band walk with loop band at knees 2 x 20 down/back SLS on airex 2 x 60 sec each  PATIENT EDUCATION: Education details: POC discharge, HEP Person educated: Patient Education method: Explanation Education comprehension: Verbalized understanding  HOME EXERCISE PROGRAM: Access  Code: [email or web address]   ASSESSMENT: CLINICAL IMPRESSION: Patient tolerated therapy well with no adverse effects. She has made great progress in PT and has achieved all established goals, demonstrating improved strength, mobility, balance, and walking. Therapy focused on strengthening and balance primarily, and reviewing exercises patient could perform at home using her loop bands. No pain reported in therapy and no changes to her HEP this visit. Patient will be formally discharged from PT at this time.   Eval: Patient is a 73 y.o. female who was seen today for physical therapy evaluation and treatment for right shoulder, lower back, left heel, and bilateral knee pain. Her primary complaint of pain today seemed to be her right heel from when she was walking on the treadmill earlier. She does exhibit some calf tightness and weakness. She also exhibit strength deficit of the right shoulder and hips that are likely contributing to her symptoms. Her back and right shoulder move well but she does  report some tightness and tenderness of the right upper trap region.  OBJECTIVE IMPAIRMENTS: decreased activity tolerance, decreased ROM, decreased strength, impaired flexibility, postural dysfunction, and pain.   ACTIVITY LIMITATIONS: lifting, bending, sitting, squatting, reach over head, and locomotion level  PARTICIPATION LIMITATIONS: meal prep, cleaning, community activity, and yard work  PERSONAL FACTORS: Fitness, Past/current experiences, and Time since onset of injury/illness/exacerbation are also affecting patient's functional outcome.    GOALS: Goals reviewed with patient? Yes  SHORT TERM GOALS: Target date: 05/29/2024  Patient will be I with initial HEP in order to progress with therapy. Baseline: HEP provided at eval 05/29/2024: independent with initial HEP Goal status: MET  2.  Patient will report pain level with activity </= 1/10 in order to reduce functional limitations Baseline: see pain score  above 05/29/2024: continue to report left heel pain with activity Goal status: MET  LONG TERM GOALS: Target date: 07/31/2024  Patient will be I with final HEP to maintain progress from PT. Baseline: HEP provided at eval 07/03/2024: progressing 07/17/2024: independent Goal status: MET  2.  Patient will report PSFS >/= 9 in order to indicate improvement in their functional ability. Baseline: 8.2 07/03/2024: 9 Goal status: MET  3.  Patient will demonstrate hip strength grossly >/= 4/5 MMT to improve activity tolerance and reduce pain Baseline: grossly 4-/5 MMT 07/03/2024: see limitations above 07/17/2024: grossly 4/5 MMT Goal status: MET  4.  Patient will demonstrate right shoulder strength 5/5 MMT in order to improve activity tolerance with right shoulder Baseline: see limitations above 07/03/2024: 5/5 Goal status: MET   PLAN: PT FREQUENCY: 1x/week  PT DURATION: 4 weeks  PLANNED INTERVENTIONS: 97164- PT Re-evaluation, 97750- Physical Performance Testing, 97110-Therapeutic exercises, 97530- Therapeutic activity, 97112- Neuromuscular re-education, 97535- Self Care, 02859- Manual therapy, Patient/Family education, Balance training, Stair training, Taping, Joint mobilization, Joint manipulation, Spinal manipulation, Spinal mobilization, Cryotherapy, and Moist heat  PLAN FOR NEXT SESSION: Review HEP and progress PRN; manual for right upper trap region; strengthening for right shoulder, core, hips, and LE; postural control; dynamic balance   Elaine Daring, PT, DPT, LAT, ATC 07/17/24  3:58 PM Phone: [phone] Fax: [phone]   PHYSICAL THERAPY DISCHARGE SUMMARY  Visits from Start of Care: 9  Current functional level related to goals / functional outcomes: See above   Remaining deficits: See above   Education / Equipment: HEP   Patient agrees to discharge. Patient goals were met. Patient is being discharged due to being pleased with the current functional  level.

## 2024-07-19 ENCOUNTER — Encounter: Payer: Self-pay | Admitting: Internal Medicine

## 2024-07-19 ENCOUNTER — Ambulatory Visit: Admitting: Obstetrics and Gynecology

## 2024-07-21 ENCOUNTER — Ambulatory Visit: Admitting: Obstetrics and Gynecology

## 2024-07-31 ENCOUNTER — Encounter: Payer: Self-pay | Admitting: Internal Medicine

## 2024-07-31 ENCOUNTER — Ambulatory Visit (INDEPENDENT_AMBULATORY_CARE_PROVIDER_SITE_OTHER): Admitting: Internal Medicine

## 2024-07-31 ENCOUNTER — Other Ambulatory Visit: Payer: Self-pay

## 2024-07-31 VITALS — BP 118/64 | HR 87 | Temp 98.3°F | Ht <= 58 in | Wt 115.0 lb

## 2024-07-31 DIAGNOSIS — I10 Essential (primary) hypertension: Secondary | ICD-10-CM | POA: Diagnosis not present

## 2024-07-31 DIAGNOSIS — N390 Urinary tract infection, site not specified: Secondary | ICD-10-CM | POA: Diagnosis not present

## 2024-07-31 DIAGNOSIS — R300 Dysuria: Secondary | ICD-10-CM

## 2024-07-31 DIAGNOSIS — D802 Selective deficiency of immunoglobulin A [IgA]: Secondary | ICD-10-CM

## 2024-07-31 DIAGNOSIS — N3001 Acute cystitis with hematuria: Secondary | ICD-10-CM

## 2024-07-31 LAB — POC URINALSYSI DIPSTICK (AUTOMATED)
Bilirubin, UA: NEGATIVE
Glucose, UA: NEGATIVE
Ketones, UA: NEGATIVE
Nitrite, UA: NEGATIVE
Protein, UA: POSITIVE — AB
Spec Grav, UA: 1.01 (ref 1.010–1.025)
Urobilinogen, UA: 0.2 U/dL
pH, UA: 7 (ref 5.0–8.0)

## 2024-07-31 MED ORDER — CIPROFLOXACIN HCL 250 MG PO TABS: 250.0000 mg | ORAL_TABLET | Freq: Two times a day (BID) | ORAL | 0 refills | Status: AC

## 2024-07-31 NOTE — Assessment & Plan Note (Signed)
 Acute Urine dip consistent with UTI Will send urine for culture Reviewed last several cultures Take Cipro  250 mg twice daily x 5 days Take tylenol  if needed.   Increase your water intake.  Call if no improvement

## 2024-07-31 NOTE — Assessment & Plan Note (Signed)
 Chronic Recurrent UTI Currently not taking any medication for prevention - was on macrobid  in the past Did see a urogynecologist at 1 point Interested in seeing urology-referral ordered

## 2024-07-31 NOTE — Assessment & Plan Note (Signed)
 Chronic Slightly low level 2008 Will give a slightly longer duration of antibiotics recurrent UTI

## 2024-07-31 NOTE — Progress Notes (Signed)
 Subjective:    Patient ID: [name], female    DOB: 1951-08-04, 74 y.o.   MRN: [id number]      HPI [name] is here for  Chief Complaint  Patient presents with   Cystitis    Last Wednesday patient had cramping through abdomen with bloating with back pain   Discussed the use of AI scribe software for clinical note transcription with the patient, who gave verbal consent to proceed.  History of Present Illness [name] is a 73 year old female who presents with abdominal discomfort and symptoms suggestive of a urinary tract infection.  Symptoms began after eating a burrito on Wednesday night, initially thought to be related to the meal. By Thursday, she experienced generalized abdominal discomfort, bloating, and cramping, which she initially attributed to her diet. She describes her abdominal pain as spanning 'hip to hip,' and by Saturday, she felt bloated up to her diaphragm. Despite these symptoms, she has regular bowel movements without constipation or diarrhea. No nausea, but she has a lack of appetite and feels exhausted.  She reports frequent urination with increased urgency, especially noticeable when standing up after sitting. She wakes up frequently at night to urinate and describes feeling chilled and shivering at night, although she has not recorded a fever, with her temperature consistently around 97 degrees Fahrenheit. She experiences dysuria, which has become more intense. She took five or six tablets of an old prescription of Macrobid  from 2023 over the past several days, which provided minimal relief. She is unsure about the presence of hematuria.  No heartburn or reflux, except for one instance relieved with over-the-counter medication. Unable to confirm any blood in her stool.      Medications and allergies reviewed with patient and updated if appropriate.  Current Outpatient Medications on File Prior to Visit  Medication Sig Dispense Refill    ALPRAZolam  (XANAX ) 0.25 MG tablet TAKE 1 TABLET(0.25 MG) BY MOUTH TWICE DAILY AS NEEDED FOR ANXIETY 30 tablet 2   amLODipine  (NORVASC ) 10 MG tablet TAKE 1 TABLET(10 MG) BY MOUTH DAILY 90 tablet 2   CALCIUM  CITRATE PO Take by mouth.     cetirizine (ZYRTEC) 10 MG tablet Take 10 mg by mouth daily.     Cholecalciferol  (VITAMIN D -3 PO) Take by mouth.     cyanocobalamin  (VITAMIN B12) 1000 MCG tablet Take 1,000 mcg by mouth daily.     hydrochlorothiazide  (HYDRODIURIL ) 12.5 MG tablet TAKE 1 TABLET(12.5 MG) BY MOUTH DAILY 90 tablet 3   methocarbamol  (ROBAXIN ) 500 MG tablet Take 1 tablet (500 mg total) by mouth every 8 (eight) hours as needed for muscle spasms. 60 tablet 1   pravastatin  (PRAVACHOL ) 40 MG tablet TAKE 1 TABLET(40 MG) BY MOUTH DAILY 90 tablet 2   No current facility-administered medications on file prior to visit.    Review of Systems  Constitutional:  Positive for appetite change (dec), chills (last night) and fatigue. Negative for fever.  Gastrointestinal:  Positive for abdominal distention (bloated) and abdominal pain (cramping, aching). Negative for constipation, diarrhea and nausea.       One episode of gerd  Genitourinary:  Positive for dysuria, frequency and urgency. Negative for hematuria.       Objective:   Vitals:   07/31/24 0843  BP: 118/64  Pulse: 87  Temp: 98.3 F (36.8 C)  SpO2: 98%   BP Readings from Last 3 Encounters:  07/31/24 118/64  07/05/24 120/78  06/26/24 118/72   Wt Readings from  Last 3 Encounters:  07/31/24 115 lb (52.2 kg)  07/05/24 116 lb (52.6 kg)  06/26/24 117 lb (53.1 kg)   Body mass index is 24.04 kg/m.    Physical Exam Constitutional:      General: She is not in acute distress.    Appearance: Normal appearance. She is not ill-appearing.  HENT:     Head: Normocephalic.  Eyes:     Conjunctiva/sclera: Conjunctivae normal.  Abdominal:     General: There is no distension.     Palpations: Abdomen is soft.     Tenderness: There is  abdominal tenderness (Across lower abdomen). There is guarding (Mild). There is no right CVA tenderness, left CVA tenderness or rebound.  Skin:    General: Skin is warm and dry.  Neurological:     Mental Status: She is alert.            Assessment & Plan:    See Problem List for Assessment and Plan of chronic medical problems.

## 2024-07-31 NOTE — Patient Instructions (Addendum)
        Medications changes include :   Cipro  250 mg twice daily x 5 days    A referral was ordered for urology.     Return if symptoms worsen or fail to improve.

## 2024-07-31 NOTE — Assessment & Plan Note (Signed)
Chronic Blood pressure controlled Continue amlodipine 10 mg daily, hctz 12.5 mg daily

## 2024-08-02 ENCOUNTER — Encounter: Payer: Self-pay | Admitting: Internal Medicine

## 2024-08-03 ENCOUNTER — Ambulatory Visit: Payer: Self-pay | Admitting: Internal Medicine

## 2024-08-03 LAB — CULTURE, URINE COMPREHENSIVE

## 2024-09-10 ENCOUNTER — Other Ambulatory Visit: Payer: Self-pay | Admitting: Internal Medicine

## 2024-09-18 ENCOUNTER — Encounter: Payer: Self-pay | Admitting: *Deleted

## 2024-10-06 ENCOUNTER — Ambulatory Visit: Payer: Self-pay

## 2024-10-06 NOTE — Telephone Encounter (Signed)
 FYI Only or Action Required?: FYI only for provider: appointment scheduled on 2/2.  Patient was last seen in primary care on 07/31/2024 by Geofm Glade PARAS, MD.  Called Nurse Triage reporting Foot Pain.  Symptoms began several weeks ago.  Interventions attempted: OTC medications: ibuprofen .  Symptoms are: gradually worsening.  Triage Disposition: See PCP When Office is Open (Within 3 Days)  Patient/caregiver understands and will follow disposition?: Yes  Reason for Triage: Requesting a referral to a podiatrist for evaluation of the patient's left foot. The patient reports ongoing pain.  Reason for Disposition  [1] MODERATE pain (e.g., interferes with normal activities, limping) AND [2] present > 3 days  Answer Assessment - Initial Assessment Questions 1. ONSET: When did the pain start?      Months ago 2. LOCATION: Where is the pain located?      Left heel 3. PAIN: How bad is the pain?    (Scale 1-10; or mild, moderate, severe)     moderate 4. WORK OR EXERCISE: Has there been any recent work or exercise that involved this part of the body?      no 6. OTHER SYMPTOMS: Do you have any other symptoms? (e.g., leg pain, rash, fever, numbness)     denies  Protocols used: Foot Pain-A-AH

## 2024-10-09 ENCOUNTER — Ambulatory Visit: Admitting: Internal Medicine

## 2024-10-10 ENCOUNTER — Encounter: Payer: Self-pay | Admitting: Internal Medicine

## 2024-10-11 ENCOUNTER — Encounter: Payer: Self-pay | Admitting: Internal Medicine

## 2024-10-11 ENCOUNTER — Ambulatory Visit: Admitting: Internal Medicine

## 2024-10-11 VITALS — BP 122/70 | HR 73 | Temp 98.0°F | Ht <= 58 in

## 2024-10-11 DIAGNOSIS — M79672 Pain in left foot: Secondary | ICD-10-CM

## 2024-10-11 DIAGNOSIS — I10 Essential (primary) hypertension: Secondary | ICD-10-CM

## 2024-10-11 NOTE — Patient Instructions (Signed)
     A referral was ordered for podiatry and someone will call you to schedule an appointment.

## 2024-10-11 NOTE — Assessment & Plan Note (Signed)
Chronic Blood pressure controlled Continue amlodipine 10 mg daily, hctz 12.5 mg daily

## 2024-10-13 ENCOUNTER — Encounter: Payer: Self-pay | Admitting: Podiatry

## 2024-10-13 ENCOUNTER — Ambulatory Visit

## 2024-10-13 ENCOUNTER — Ambulatory Visit: Admitting: Podiatry

## 2024-10-13 DIAGNOSIS — M7752 Other enthesopathy of left foot: Secondary | ICD-10-CM

## 2024-10-13 DIAGNOSIS — M205X2 Other deformities of toe(s) (acquired), left foot: Secondary | ICD-10-CM

## 2024-10-13 DIAGNOSIS — M722 Plantar fascial fibromatosis: Secondary | ICD-10-CM

## 2024-10-13 DIAGNOSIS — M7662 Achilles tendinitis, left leg: Secondary | ICD-10-CM

## 2024-10-13 MED ORDER — BETAMETHASONE SOD PHOS & ACET 6 (3-3) MG/ML IJ SUSP
6.0000 mg | Freq: Once | INTRAMUSCULAR | Status: AC
  Administered 2024-10-13: 6 mg

## 2024-10-13 MED ORDER — BETAMETHASONE SOD PHOS & ACET 6 (3-3) MG/ML IJ SUSP: 12.0000 mg | Freq: Once | INTRAMUSCULAR | Status: DC

## 2024-10-13 NOTE — Progress Notes (Unsigned)
"  °  Subjective:  Patient ID: [name], female    DOB: August 02, 1951,  MRN: [id number]  Chief Complaint  Patient presents with   Foot Pain    Left foot pain midfoot dorsal and plantar, and plantar heel.  Very painful 2 nights ago through now and no incident.  Not diabetic no anti coag    Discussed the use of AI scribe software for clinical note transcription with the patient, who gave verbal consent to proceed.  History of Present Illness       Objective:    Physical Exam    No images are attached to the encounter.    Results    Assessment:   1. Plantar fasciitis of left foot   2. Achilles tendinitis, left leg   3. Hallux limitus of left foot      Plan:  Patient was evaluated and treated and all questions answered.  Assessment and Plan Assessment & Plan       Return in about 3 weeks (around 11/03/2024) for Plantar Fasciitis/hallux rigidus.     Injection left heel and plantar fascia brace "

## 2024-10-13 NOTE — Patient Instructions (Addendum)

## 2024-10-19 ENCOUNTER — Ambulatory Visit: Admitting: Podiatry

## 2024-11-03 ENCOUNTER — Ambulatory Visit: Admitting: Podiatry

## 2024-12-01 ENCOUNTER — Ambulatory Visit

## 2025-01-03 ENCOUNTER — Ambulatory Visit: Admitting: Internal Medicine
